# Patient Record
Sex: Male | Born: 1935 | Race: White | Hispanic: No | Marital: Married | Smoking: Former smoker
Health system: Southern US, Community
[De-identification: ages and names within clinical notes are randomized; demographics above are authoritative.]

## PROBLEM LIST (undated history)

## (undated) DIAGNOSIS — R519 Headache, unspecified: Secondary | ICD-10-CM

## (undated) DIAGNOSIS — K5900 Constipation, unspecified: Secondary | ICD-10-CM

## (undated) DIAGNOSIS — IMO0001 Reserved for inherently not codable concepts without codable children: Secondary | ICD-10-CM

## (undated) DIAGNOSIS — I739 Peripheral vascular disease, unspecified: Secondary | ICD-10-CM

## (undated) DIAGNOSIS — E1149 Type 2 diabetes mellitus with other diabetic neurological complication: Secondary | ICD-10-CM

## (undated) DIAGNOSIS — E782 Mixed hyperlipidemia: Secondary | ICD-10-CM

## (undated) DIAGNOSIS — G709 Myoneural disorder, unspecified: Secondary | ICD-10-CM

## (undated) DIAGNOSIS — M1990 Unspecified osteoarthritis, unspecified site: Secondary | ICD-10-CM

## (undated) DIAGNOSIS — I639 Cerebral infarction, unspecified: Secondary | ICD-10-CM

## (undated) DIAGNOSIS — G4733 Obstructive sleep apnea (adult) (pediatric): Secondary | ICD-10-CM

## (undated) DIAGNOSIS — D414 Neoplasm of uncertain behavior of bladder: Secondary | ICD-10-CM

## (undated) DIAGNOSIS — R259 Unspecified abnormal involuntary movements: Secondary | ICD-10-CM

## (undated) DIAGNOSIS — E039 Hypothyroidism, unspecified: Secondary | ICD-10-CM

## (undated) DIAGNOSIS — Z8719 Personal history of other diseases of the digestive system: Secondary | ICD-10-CM

## (undated) DIAGNOSIS — I4891 Unspecified atrial fibrillation: Secondary | ICD-10-CM

## (undated) DIAGNOSIS — I499 Cardiac arrhythmia, unspecified: Secondary | ICD-10-CM

## (undated) DIAGNOSIS — E538 Deficiency of other specified B group vitamins: Secondary | ICD-10-CM

## (undated) DIAGNOSIS — N200 Calculus of kidney: Secondary | ICD-10-CM

## (undated) DIAGNOSIS — G4730 Sleep apnea, unspecified: Secondary | ICD-10-CM

## (undated) DIAGNOSIS — R51 Headache: Secondary | ICD-10-CM

## (undated) DIAGNOSIS — N401 Enlarged prostate with lower urinary tract symptoms: Secondary | ICD-10-CM

## (undated) DIAGNOSIS — Z800 Family history of malignant neoplasm of digestive organs: Secondary | ICD-10-CM

## (undated) DIAGNOSIS — E669 Obesity, unspecified: Secondary | ICD-10-CM

## (undated) DIAGNOSIS — J90 Pleural effusion, not elsewhere classified: Secondary | ICD-10-CM

## (undated) DIAGNOSIS — N138 Other obstructive and reflux uropathy: Secondary | ICD-10-CM

## (undated) HISTORY — PX: KIDNEY STONE SURGERY: SHX686

## (undated) HISTORY — DX: Unspecified atrial fibrillation: I48.91

## (undated) HISTORY — PX: CATARACT EXTRACTION W/ INTRAOCULAR LENS  IMPLANT, BILATERAL: SHX1307

## (undated) HISTORY — DX: Mixed hyperlipidemia: E78.2

## (undated) HISTORY — DX: Constipation, unspecified: K59.00

## (undated) HISTORY — DX: Family history of malignant neoplasm of digestive organs: Z80.0

## (undated) HISTORY — PX: TRANSURETHRAL RESECTION OF BLADDER: SUR1395

## (undated) HISTORY — DX: Peripheral vascular disease, unspecified: I73.9

## (undated) HISTORY — PX: APPENDECTOMY: SHX54

## (undated) HISTORY — PX: URETERAL STENT PLACEMENT: SHX822

## (undated) HISTORY — PX: EYE SURGERY: SHX253

## (undated) HISTORY — PX: CYSTOSCOPY W/ STONE MANIPULATION: SHX1427

## (undated) HISTORY — DX: Deficiency of other specified B group vitamins: E53.8

## (undated) HISTORY — PX: OTHER SURGICAL HISTORY: SHX169

## (undated) HISTORY — DX: Other obstructive and reflux uropathy: N13.8

## (undated) HISTORY — DX: Obesity, unspecified: E66.9

## (undated) HISTORY — DX: Neoplasm of uncertain behavior of bladder: D41.4

## (undated) HISTORY — PX: FRACTURE SURGERY: SHX138

## (undated) HISTORY — DX: Benign prostatic hyperplasia with lower urinary tract symptoms: N40.1

## (undated) HISTORY — DX: Unspecified abnormal involuntary movements: R25.9

## (undated) HISTORY — DX: Type 2 diabetes mellitus with other diabetic neurological complication: E11.49

## (undated) HISTORY — DX: Sleep apnea, unspecified: G47.30

---
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
## 1974-12-05 HISTORY — PX: ORIF ELBOW FRACTURE: SHX5031

## 2001-06-28 ENCOUNTER — Encounter: Admission: RE | Admit: 2001-06-28 | Discharge: 2001-06-28 | Payer: Self-pay | Admitting: Internal Medicine

## 2001-06-28 ENCOUNTER — Encounter: Payer: Self-pay | Admitting: Internal Medicine

## 2001-09-24 ENCOUNTER — Encounter: Payer: Self-pay | Admitting: Gastroenterology

## 2001-09-24 ENCOUNTER — Encounter (INDEPENDENT_AMBULATORY_CARE_PROVIDER_SITE_OTHER): Payer: Self-pay | Admitting: *Deleted

## 2001-09-24 ENCOUNTER — Ambulatory Visit (HOSPITAL_COMMUNITY): Admission: RE | Admit: 2001-09-24 | Discharge: 2001-09-24 | Payer: Self-pay | Admitting: Gastroenterology

## 2001-10-11 ENCOUNTER — Encounter: Payer: Self-pay | Admitting: Urology

## 2001-10-11 ENCOUNTER — Encounter: Admission: RE | Admit: 2001-10-11 | Discharge: 2001-10-11 | Payer: Self-pay | Admitting: Urology

## 2002-04-24 ENCOUNTER — Encounter: Admission: RE | Admit: 2002-04-24 | Discharge: 2002-07-23 | Payer: Self-pay | Admitting: Gastroenterology

## 2003-07-12 ENCOUNTER — Emergency Department (HOSPITAL_COMMUNITY): Admission: EM | Admit: 2003-07-12 | Discharge: 2003-07-12 | Payer: Self-pay | Admitting: Emergency Medicine

## 2003-07-12 ENCOUNTER — Encounter: Payer: Self-pay | Admitting: Emergency Medicine

## 2003-07-15 ENCOUNTER — Encounter: Payer: Self-pay | Admitting: Urology

## 2003-07-17 ENCOUNTER — Ambulatory Visit (HOSPITAL_COMMUNITY): Admission: RE | Admit: 2003-07-17 | Discharge: 2003-07-17 | Payer: Self-pay | Admitting: Urology

## 2003-12-16 ENCOUNTER — Encounter: Admission: RE | Admit: 2003-12-16 | Discharge: 2003-12-16 | Payer: Self-pay | Admitting: Gastroenterology

## 2004-01-26 ENCOUNTER — Encounter (INDEPENDENT_AMBULATORY_CARE_PROVIDER_SITE_OTHER): Payer: Self-pay | Admitting: *Deleted

## 2004-01-26 ENCOUNTER — Ambulatory Visit (HOSPITAL_BASED_OUTPATIENT_CLINIC_OR_DEPARTMENT_OTHER): Admission: RE | Admit: 2004-01-26 | Discharge: 2004-01-26 | Payer: Self-pay | Admitting: Urology

## 2004-01-26 ENCOUNTER — Ambulatory Visit (HOSPITAL_COMMUNITY): Admission: RE | Admit: 2004-01-26 | Discharge: 2004-01-26 | Payer: Self-pay | Admitting: Urology

## 2004-02-25 ENCOUNTER — Encounter: Payer: Self-pay | Admitting: Gastroenterology

## 2004-07-28 ENCOUNTER — Ambulatory Visit (HOSPITAL_BASED_OUTPATIENT_CLINIC_OR_DEPARTMENT_OTHER): Admission: RE | Admit: 2004-07-28 | Discharge: 2004-07-28 | Payer: Self-pay | Admitting: Urology

## 2004-07-28 ENCOUNTER — Ambulatory Visit (HOSPITAL_COMMUNITY): Admission: RE | Admit: 2004-07-28 | Discharge: 2004-07-28 | Payer: Self-pay | Admitting: Urology

## 2004-08-11 ENCOUNTER — Encounter: Admission: RE | Admit: 2004-08-11 | Discharge: 2004-08-11 | Payer: Self-pay | Admitting: Urology

## 2004-08-12 ENCOUNTER — Ambulatory Visit (HOSPITAL_COMMUNITY): Admission: RE | Admit: 2004-08-12 | Discharge: 2004-08-12 | Payer: Self-pay | Admitting: Urology

## 2004-08-12 ENCOUNTER — Ambulatory Visit (HOSPITAL_BASED_OUTPATIENT_CLINIC_OR_DEPARTMENT_OTHER): Admission: RE | Admit: 2004-08-12 | Discharge: 2004-08-12 | Payer: Self-pay | Admitting: Urology

## 2006-02-27 ENCOUNTER — Encounter: Admission: RE | Admit: 2006-02-27 | Discharge: 2006-02-27 | Payer: Self-pay | Admitting: Gastroenterology

## 2006-07-14 ENCOUNTER — Inpatient Hospital Stay (HOSPITAL_COMMUNITY): Admission: AD | Admit: 2006-07-14 | Discharge: 2006-07-17 | Payer: Self-pay | Admitting: Cardiology

## 2006-10-16 ENCOUNTER — Ambulatory Visit (HOSPITAL_BASED_OUTPATIENT_CLINIC_OR_DEPARTMENT_OTHER): Admission: RE | Admit: 2006-10-16 | Discharge: 2006-10-16 | Payer: Self-pay | Admitting: Urology

## 2007-01-02 ENCOUNTER — Ambulatory Visit: Payer: Self-pay | Admitting: Cardiology

## 2007-01-10 ENCOUNTER — Ambulatory Visit: Payer: Self-pay | Admitting: Cardiology

## 2007-01-10 ENCOUNTER — Ambulatory Visit: Payer: Self-pay | Admitting: Internal Medicine

## 2007-01-15 ENCOUNTER — Ambulatory Visit: Payer: Self-pay | Admitting: Cardiology

## 2007-01-15 ENCOUNTER — Ambulatory Visit: Payer: Self-pay | Admitting: Internal Medicine

## 2007-01-15 LAB — CONVERTED CEMR LAB
Albumin: 3.8 g/dL (ref 3.5–5.2)
CO2: 26 meq/L (ref 19–32)
Calcium: 9.4 mg/dL (ref 8.4–10.5)
Chloride: 103 meq/L (ref 96–112)
Direct LDL: 56.1 mg/dL
GFR calc Af Amer: 70 mL/min
Glucose, Bld: 167 mg/dL — ABNORMAL HIGH (ref 70–99)
HDL: 32.2 mg/dL — ABNORMAL LOW (ref >39.0)
Microalb, Ur: 4.5 mg/dL — ABNORMAL HIGH (ref 0.0–1.9)
Sodium: 137 meq/L (ref 135–145)
Triglycerides: 342 mg/dL (ref 0–149)

## 2007-01-24 ENCOUNTER — Ambulatory Visit: Payer: Self-pay | Admitting: Cardiovascular Disease

## 2007-01-24 ENCOUNTER — Ambulatory Visit: Payer: Self-pay | Admitting: Internal Medicine

## 2007-01-24 LAB — CONVERTED CEMR LAB
GFR calc Af Amer: 70 mL/min
GFR calc non Af Amer: 58 mL/min
Potassium: 5.2 meq/L — ABNORMAL HIGH (ref 3.5–5.1)

## 2007-01-29 ENCOUNTER — Ambulatory Visit: Payer: Self-pay | Admitting: Internal Medicine

## 2007-01-30 ENCOUNTER — Ambulatory Visit: Payer: Self-pay | Admitting: Gastroenterology

## 2007-02-07 ENCOUNTER — Ambulatory Visit: Payer: Self-pay | Admitting: Gastroenterology

## 2007-02-08 ENCOUNTER — Ambulatory Visit: Payer: Self-pay | Admitting: Internal Medicine

## 2007-02-28 ENCOUNTER — Ambulatory Visit: Payer: Self-pay | Admitting: Internal Medicine

## 2007-03-08 ENCOUNTER — Ambulatory Visit: Payer: Self-pay | Admitting: Cardiology

## 2007-03-23 ENCOUNTER — Ambulatory Visit: Payer: Self-pay | Admitting: Gastroenterology

## 2007-03-26 ENCOUNTER — Ambulatory Visit: Payer: Self-pay | Admitting: Cardiology

## 2007-04-25 ENCOUNTER — Ambulatory Visit: Payer: Self-pay | Admitting: Cardiology

## 2007-05-14 ENCOUNTER — Ambulatory Visit: Payer: Self-pay | Admitting: Internal Medicine

## 2007-05-14 ENCOUNTER — Encounter: Payer: Self-pay | Admitting: Internal Medicine

## 2007-05-14 ENCOUNTER — Ambulatory Visit (HOSPITAL_BASED_OUTPATIENT_CLINIC_OR_DEPARTMENT_OTHER): Admission: RE | Admit: 2007-05-14 | Discharge: 2007-05-14 | Payer: Self-pay | Admitting: Internal Medicine

## 2007-05-19 ENCOUNTER — Ambulatory Visit: Payer: Self-pay | Admitting: Internal Medicine

## 2007-05-29 ENCOUNTER — Ambulatory Visit: Payer: Self-pay | Admitting: Internal Medicine

## 2007-05-29 ENCOUNTER — Ambulatory Visit: Payer: Self-pay | Admitting: Cardiovascular Disease

## 2007-05-29 LAB — CONVERTED CEMR LAB
ALT: 19 units/L (ref 0–40)
Albumin: 3.6 g/dL (ref 3.5–5.2)
Alkaline Phosphatase: 73 units/L (ref 39–117)
BUN: 30 mg/dL — ABNORMAL HIGH (ref 6–23)
Basophils Relative: 0.4 % (ref 0.0–1.0)
Bilirubin, Direct: 0.1 mg/dL (ref 0.0–0.3)
Cholesterol: 133 mg/dL (ref 0–200)
Direct LDL: 66.2 mg/dL
GFR calc Af Amer: 64 mL/min
GFR calc non Af Amer: 53 mL/min
HCT: 42.6 % (ref 39.0–52.0)
HDL: 26.2 mg/dL — ABNORMAL LOW (ref >39.0)
Hemoglobin: 14.3 g/dL (ref 13.0–17.0)
Lymphocytes Relative: 19 % (ref 12.0–46.0)
MCHC: 33.5 g/dL (ref 30.0–36.0)
MCV: 93 fL (ref 78.0–100.0)
Monocytes Relative: 8.8 % (ref 3.0–11.0)
Platelets: 166 10*3/uL (ref 150–400)
Pro B Natriuretic peptide (BNP): 101 pg/mL — ABNORMAL HIGH (ref 0.0–100.0)
RDW: 14.5 % (ref 11.5–14.6)
Sodium: 145 meq/L (ref 135–145)
TSH: 3.96 microintl units/mL (ref 0.35–5.50)
Total CHOL/HDL Ratio: 5.1
Triglycerides: 203 mg/dL (ref 0–149)
VLDL: 41 mg/dL — ABNORMAL HIGH (ref 0–40)
WBC: 6.4 10*3/uL (ref 4.5–10.5)

## 2007-06-13 ENCOUNTER — Ambulatory Visit: Payer: Self-pay | Admitting: Internal Medicine

## 2007-06-26 ENCOUNTER — Ambulatory Visit: Payer: Self-pay | Admitting: Cardiology

## 2007-07-16 ENCOUNTER — Ambulatory Visit: Payer: Self-pay | Admitting: Internal Medicine

## 2007-07-17 ENCOUNTER — Ambulatory Visit: Payer: Self-pay | Admitting: Internal Medicine

## 2007-07-24 ENCOUNTER — Ambulatory Visit: Payer: Self-pay | Admitting: Cardiology

## 2007-08-16 ENCOUNTER — Ambulatory Visit: Payer: Self-pay | Admitting: Internal Medicine

## 2007-08-21 ENCOUNTER — Ambulatory Visit: Payer: Self-pay | Admitting: Cardiology

## 2007-08-21 ENCOUNTER — Ambulatory Visit: Payer: Self-pay | Admitting: Internal Medicine

## 2007-08-24 ENCOUNTER — Ambulatory Visit: Payer: Self-pay | Admitting: Internal Medicine

## 2007-08-24 DIAGNOSIS — E1065 Type 1 diabetes mellitus with hyperglycemia: Secondary | ICD-10-CM

## 2007-08-24 LAB — CONVERTED CEMR LAB
BUN: 22 mg/dL (ref 6–23)
CO2: 31 meq/L (ref 19–32)
GFR calc Af Amer: 69 mL/min
Glucose, Bld: 151 mg/dL — ABNORMAL HIGH (ref 70–99)
Potassium: 4.8 meq/L (ref 3.5–5.1)

## 2007-09-07 ENCOUNTER — Encounter: Payer: Self-pay | Admitting: Internal Medicine

## 2007-09-07 ENCOUNTER — Ambulatory Visit: Payer: Self-pay | Admitting: Internal Medicine

## 2007-09-12 ENCOUNTER — Ambulatory Visit: Payer: Self-pay | Admitting: Internal Medicine

## 2007-09-18 ENCOUNTER — Ambulatory Visit: Payer: Self-pay | Admitting: Cardiovascular Disease

## 2007-10-08 ENCOUNTER — Encounter: Payer: Self-pay | Admitting: Internal Medicine

## 2007-10-08 DIAGNOSIS — E669 Obesity, unspecified: Secondary | ICD-10-CM

## 2007-10-08 DIAGNOSIS — G4733 Obstructive sleep apnea (adult) (pediatric): Secondary | ICD-10-CM

## 2007-10-08 DIAGNOSIS — E782 Mixed hyperlipidemia: Secondary | ICD-10-CM

## 2007-10-08 DIAGNOSIS — I4891 Unspecified atrial fibrillation: Secondary | ICD-10-CM | POA: Insufficient documentation

## 2007-10-12 ENCOUNTER — Ambulatory Visit: Payer: Self-pay | Admitting: Internal Medicine

## 2007-10-12 DIAGNOSIS — N400 Enlarged prostate without lower urinary tract symptoms: Secondary | ICD-10-CM

## 2007-10-18 ENCOUNTER — Ambulatory Visit: Payer: Self-pay | Admitting: Cardiology

## 2007-10-31 ENCOUNTER — Ambulatory Visit: Payer: Self-pay | Admitting: Cardiology

## 2007-11-19 ENCOUNTER — Telehealth: Payer: Self-pay | Admitting: Internal Medicine

## 2007-11-21 ENCOUNTER — Telehealth: Payer: Self-pay | Admitting: Internal Medicine

## 2007-12-13 ENCOUNTER — Telehealth: Payer: Self-pay | Admitting: Internal Medicine

## 2007-12-14 ENCOUNTER — Ambulatory Visit: Payer: Self-pay | Admitting: Internal Medicine

## 2007-12-14 LAB — CONVERTED CEMR LAB
ALT: 27 units/L (ref 0–53)
Alkaline Phosphatase: 100 units/L (ref 39–117)
BUN: 27 mg/dL — ABNORMAL HIGH (ref 6–23)
Bilirubin Urine: NEGATIVE
CO2: 27 meq/L (ref 19–32)
Calcium: 9.3 mg/dL (ref 8.4–10.5)
Chloride: 104 meq/L (ref 96–112)
Creatinine, Ser: 1.3 mg/dL (ref 0.4–1.5)
Crystals: NEGATIVE
GFR calc non Af Amer: 57 mL/min
Glucose, Bld: 280 mg/dL — ABNORMAL HIGH (ref 70–99)
Hemoglobin, Urine: NEGATIVE
Leukocytes, UA: NEGATIVE
Potassium: 4.8 meq/L (ref 3.5–5.1)
Specific Gravity, Urine: 1.02 (ref 1.000–1.03)
Total Bilirubin: 0.9 mg/dL (ref 0.3–1.2)
Total CHOL/HDL Ratio: 4.8
Total Protein, Urine: NEGATIVE mg/dL
Urobilinogen, UA: 0.2 (ref 0.0–1.0)
Vitamin B-12: 138 pg/mL — ABNORMAL LOW (ref 211–911)
pH: 5 (ref 5.0–8.0)

## 2007-12-16 ENCOUNTER — Encounter: Payer: Self-pay | Admitting: Internal Medicine

## 2007-12-20 ENCOUNTER — Ambulatory Visit: Payer: Self-pay | Admitting: Internal Medicine

## 2008-01-11 ENCOUNTER — Ambulatory Visit: Payer: Self-pay | Admitting: Internal Medicine

## 2008-01-17 ENCOUNTER — Ambulatory Visit: Payer: Self-pay | Admitting: Cardiology

## 2008-01-25 ENCOUNTER — Telehealth: Payer: Self-pay | Admitting: Internal Medicine

## 2008-01-31 ENCOUNTER — Ambulatory Visit: Payer: Self-pay | Admitting: Cardiology

## 2008-02-14 ENCOUNTER — Telehealth: Payer: Self-pay | Admitting: Internal Medicine

## 2008-02-14 ENCOUNTER — Ambulatory Visit: Payer: Self-pay | Admitting: Cardiology

## 2008-02-14 ENCOUNTER — Encounter: Payer: Self-pay | Admitting: Internal Medicine

## 2008-02-14 ENCOUNTER — Ambulatory Visit: Payer: Self-pay

## 2008-02-26 ENCOUNTER — Ambulatory Visit: Payer: Self-pay | Admitting: Internal Medicine

## 2008-02-26 ENCOUNTER — Telehealth: Payer: Self-pay | Admitting: Internal Medicine

## 2008-02-26 DIAGNOSIS — R42 Dizziness and giddiness: Secondary | ICD-10-CM

## 2008-02-26 LAB — CONVERTED CEMR LAB
CO2: 33 meq/L — ABNORMAL HIGH (ref 19–32)
Creatinine, Ser: 1.2 mg/dL (ref 0.4–1.5)
Potassium: 5.3 meq/L — ABNORMAL HIGH (ref 3.5–5.1)

## 2008-02-28 ENCOUNTER — Ambulatory Visit: Payer: Self-pay | Admitting: Internal Medicine

## 2008-03-03 ENCOUNTER — Encounter: Payer: Self-pay | Admitting: Internal Medicine

## 2008-03-04 ENCOUNTER — Encounter: Admission: RE | Admit: 2008-03-04 | Discharge: 2008-03-04 | Payer: Self-pay | Admitting: Internal Medicine

## 2008-03-06 ENCOUNTER — Encounter: Payer: Self-pay | Admitting: Internal Medicine

## 2008-03-20 ENCOUNTER — Encounter: Payer: Self-pay | Admitting: Internal Medicine

## 2008-03-25 ENCOUNTER — Ambulatory Visit: Payer: Self-pay | Admitting: Cardiology

## 2008-04-22 ENCOUNTER — Ambulatory Visit: Payer: Self-pay | Admitting: Cardiology

## 2008-04-25 ENCOUNTER — Encounter: Payer: Self-pay | Admitting: Internal Medicine

## 2008-05-01 ENCOUNTER — Encounter: Payer: Self-pay | Admitting: Internal Medicine

## 2008-06-25 ENCOUNTER — Ambulatory Visit: Payer: Self-pay | Admitting: Internal Medicine

## 2008-07-09 ENCOUNTER — Encounter: Payer: Self-pay | Admitting: Gastroenterology

## 2008-07-23 ENCOUNTER — Ambulatory Visit: Payer: Self-pay | Admitting: Cardiology

## 2008-08-20 ENCOUNTER — Ambulatory Visit: Payer: Self-pay | Admitting: Cardiovascular Disease

## 2008-09-09 ENCOUNTER — Ambulatory Visit: Payer: Self-pay | Admitting: Internal Medicine

## 2008-09-09 DIAGNOSIS — I739 Peripheral vascular disease, unspecified: Secondary | ICD-10-CM

## 2008-09-10 ENCOUNTER — Ambulatory Visit: Payer: Self-pay | Admitting: Internal Medicine

## 2008-09-24 ENCOUNTER — Ambulatory Visit: Payer: Self-pay | Admitting: Internal Medicine

## 2008-09-24 ENCOUNTER — Ambulatory Visit: Payer: Self-pay

## 2008-09-26 ENCOUNTER — Ambulatory Visit: Payer: Self-pay | Admitting: Internal Medicine

## 2008-09-26 DIAGNOSIS — E1149 Type 2 diabetes mellitus with other diabetic neurological complication: Secondary | ICD-10-CM

## 2008-09-26 LAB — CONVERTED CEMR LAB
ALT: 29 units/L (ref 0–53)
AST: 27 units/L (ref 0–37)
Albumin: 3.5 g/dL (ref 3.5–5.2)
BUN: 27 mg/dL — ABNORMAL HIGH (ref 6–23)
Bilirubin, Direct: 0.3 mg/dL (ref 0.0–0.3)
CO2: 31 meq/L (ref 19–32)
LDL Cholesterol: 49 mg/dL (ref 0–99)
Total Bilirubin: 1.2 mg/dL (ref 0.3–1.2)
Total CHOL/HDL Ratio: 3.7
Total Protein: 6.7 g/dL (ref 6.0–8.3)
Vitamin B-12: 141 pg/mL — ABNORMAL LOW (ref 211–911)

## 2008-09-28 ENCOUNTER — Encounter: Payer: Self-pay | Admitting: Internal Medicine

## 2008-10-08 ENCOUNTER — Ambulatory Visit: Payer: Self-pay | Admitting: Cardiovascular Disease

## 2008-10-09 ENCOUNTER — Ambulatory Visit: Payer: Self-pay | Admitting: Internal Medicine

## 2008-10-09 DIAGNOSIS — E538 Deficiency of other specified B group vitamins: Secondary | ICD-10-CM

## 2008-11-11 ENCOUNTER — Encounter: Payer: Self-pay | Admitting: Internal Medicine

## 2008-11-11 ENCOUNTER — Ambulatory Visit: Payer: Self-pay | Admitting: Internal Medicine

## 2008-11-13 ENCOUNTER — Ambulatory Visit: Payer: Self-pay | Admitting: Internal Medicine

## 2008-11-13 ENCOUNTER — Telehealth: Payer: Self-pay | Admitting: Internal Medicine

## 2008-11-13 DIAGNOSIS — G20 Parkinson's disease: Secondary | ICD-10-CM

## 2008-11-26 ENCOUNTER — Encounter: Payer: Self-pay | Admitting: Internal Medicine

## 2008-12-09 ENCOUNTER — Ambulatory Visit: Payer: Self-pay | Admitting: Internal Medicine

## 2008-12-25 ENCOUNTER — Ambulatory Visit: Payer: Self-pay | Admitting: Cardiology

## 2009-01-08 ENCOUNTER — Telehealth: Payer: Self-pay | Admitting: Internal Medicine

## 2009-01-22 ENCOUNTER — Ambulatory Visit: Payer: Self-pay | Admitting: Internal Medicine

## 2009-01-26 ENCOUNTER — Ambulatory Visit: Payer: Self-pay | Admitting: Gastroenterology

## 2009-01-26 DIAGNOSIS — K5900 Constipation, unspecified: Secondary | ICD-10-CM | POA: Insufficient documentation

## 2009-02-09 ENCOUNTER — Telehealth (INDEPENDENT_AMBULATORY_CARE_PROVIDER_SITE_OTHER): Payer: Self-pay | Admitting: *Deleted

## 2009-02-13 ENCOUNTER — Ambulatory Visit: Payer: Self-pay | Admitting: Internal Medicine

## 2009-02-17 ENCOUNTER — Telehealth: Payer: Self-pay | Admitting: Gastroenterology

## 2009-02-23 ENCOUNTER — Encounter: Payer: Self-pay | Admitting: Gastroenterology

## 2009-02-23 ENCOUNTER — Ambulatory Visit: Payer: Self-pay | Admitting: Gastroenterology

## 2009-02-25 ENCOUNTER — Encounter: Payer: Self-pay | Admitting: Gastroenterology

## 2009-03-02 ENCOUNTER — Ambulatory Visit: Payer: Self-pay | Admitting: Cardiology

## 2009-03-23 ENCOUNTER — Ambulatory Visit: Payer: Self-pay | Admitting: Cardiovascular Disease

## 2009-04-10 ENCOUNTER — Ambulatory Visit: Payer: Self-pay | Admitting: Cardiovascular Disease

## 2009-04-24 ENCOUNTER — Ambulatory Visit: Payer: Self-pay | Admitting: Gastroenterology

## 2009-05-01 ENCOUNTER — Encounter (INDEPENDENT_AMBULATORY_CARE_PROVIDER_SITE_OTHER): Payer: Self-pay | Admitting: *Deleted

## 2009-05-05 ENCOUNTER — Encounter: Payer: Self-pay | Admitting: *Deleted

## 2009-05-08 ENCOUNTER — Ambulatory Visit: Payer: Self-pay | Admitting: Cardiology

## 2009-05-08 LAB — CONVERTED CEMR LAB: Protime: 16.8

## 2009-05-11 ENCOUNTER — Inpatient Hospital Stay (HOSPITAL_COMMUNITY): Admission: EM | Admit: 2009-05-11 | Discharge: 2009-05-13 | Payer: Self-pay | Admitting: Emergency Medicine

## 2009-05-11 ENCOUNTER — Ambulatory Visit: Payer: Self-pay | Admitting: Internal Medicine

## 2009-05-11 ENCOUNTER — Telehealth: Payer: Self-pay | Admitting: Internal Medicine

## 2009-06-02 ENCOUNTER — Ambulatory Visit: Payer: Self-pay | Admitting: Internal Medicine

## 2009-06-02 ENCOUNTER — Telehealth: Payer: Self-pay | Admitting: Internal Medicine

## 2009-06-02 LAB — CONVERTED CEMR LAB: POC INR: 2

## 2009-06-09 ENCOUNTER — Ambulatory Visit: Payer: Self-pay | Admitting: Gastroenterology

## 2009-06-10 ENCOUNTER — Encounter: Payer: Self-pay | Admitting: *Deleted

## 2009-06-18 ENCOUNTER — Telehealth: Payer: Self-pay | Admitting: Internal Medicine

## 2009-06-23 ENCOUNTER — Telehealth (INDEPENDENT_AMBULATORY_CARE_PROVIDER_SITE_OTHER): Payer: Self-pay | Admitting: *Deleted

## 2009-06-30 ENCOUNTER — Encounter: Payer: Self-pay | Admitting: Internal Medicine

## 2009-06-30 ENCOUNTER — Ambulatory Visit: Payer: Self-pay | Admitting: Internal Medicine

## 2009-06-30 LAB — CONVERTED CEMR LAB
POC INR: 2.5
Prothrombin Time: 19.2 s

## 2009-07-14 ENCOUNTER — Telehealth (INDEPENDENT_AMBULATORY_CARE_PROVIDER_SITE_OTHER): Payer: Self-pay | Admitting: *Deleted

## 2009-07-16 ENCOUNTER — Ambulatory Visit: Payer: Self-pay | Admitting: Internal Medicine

## 2009-07-21 ENCOUNTER — Encounter: Payer: Self-pay | Admitting: Internal Medicine

## 2009-07-21 ENCOUNTER — Ambulatory Visit: Payer: Self-pay

## 2009-07-28 ENCOUNTER — Ambulatory Visit: Payer: Self-pay | Admitting: Internal Medicine

## 2009-07-28 LAB — CONVERTED CEMR LAB: POC INR: 2

## 2009-08-25 ENCOUNTER — Ambulatory Visit: Payer: Self-pay | Admitting: Cardiology

## 2009-08-25 LAB — CONVERTED CEMR LAB: POC INR: 2.4

## 2009-09-22 ENCOUNTER — Ambulatory Visit: Payer: Self-pay | Admitting: Cardiology

## 2009-09-22 LAB — CONVERTED CEMR LAB: POC INR: 3.3

## 2009-10-13 ENCOUNTER — Telehealth: Payer: Self-pay | Admitting: Internal Medicine

## 2009-10-13 ENCOUNTER — Ambulatory Visit: Payer: Self-pay | Admitting: Internal Medicine

## 2009-10-13 ENCOUNTER — Ambulatory Visit: Payer: Self-pay | Admitting: Cardiology

## 2009-10-13 DIAGNOSIS — E039 Hypothyroidism, unspecified: Secondary | ICD-10-CM

## 2009-10-13 DIAGNOSIS — B353 Tinea pedis: Secondary | ICD-10-CM

## 2009-10-13 LAB — CONVERTED CEMR LAB
ALT: 33 units/L (ref 0–53)
AST: 27 units/L (ref 0–37)
BUN: 32 mg/dL — ABNORMAL HIGH (ref 6–23)
Basophils Relative: 0 % (ref 0.0–3.0)
CO2: 31 meq/L (ref 19–32)
Eosinophils Relative: 2.2 % (ref 0.0–5.0)
Folate: 13.8 ng/mL
GFR calc non Af Amer: 52.33 mL/min (ref >60)
Lymphocytes Relative: 16.4 % (ref 12.0–46.0)
Monocytes Relative: 8.6 % (ref 3.0–12.0)
Neutro Abs: 5.3 10*3/uL (ref 1.4–7.7)
POC INR: 1.8
Platelets: 134 10*3/uL — ABNORMAL LOW (ref 150.0–400.0)
RBC: 4.64 M/uL (ref 4.22–5.81)
Total Bilirubin: 1.2 mg/dL (ref 0.3–1.2)
Vitamin B-12: 147 pg/mL — ABNORMAL LOW (ref 211–911)
WBC: 7.3 10*3/uL (ref 4.5–10.5)

## 2009-10-15 ENCOUNTER — Telehealth: Payer: Self-pay | Admitting: Internal Medicine

## 2009-10-15 ENCOUNTER — Ambulatory Visit: Payer: Self-pay | Admitting: Internal Medicine

## 2009-10-15 ENCOUNTER — Encounter: Payer: Self-pay | Admitting: Internal Medicine

## 2009-10-15 DIAGNOSIS — R9389 Abnormal findings on diagnostic imaging of other specified body structures: Secondary | ICD-10-CM | POA: Insufficient documentation

## 2009-10-19 ENCOUNTER — Encounter: Payer: Self-pay | Admitting: Internal Medicine

## 2009-10-20 ENCOUNTER — Encounter: Payer: Self-pay | Admitting: Cardiology

## 2009-10-20 ENCOUNTER — Encounter: Payer: Self-pay | Admitting: Internal Medicine

## 2009-10-20 ENCOUNTER — Ambulatory Visit: Payer: Self-pay | Admitting: Internal Medicine

## 2009-10-20 ENCOUNTER — Encounter (HOSPITAL_BASED_OUTPATIENT_CLINIC_OR_DEPARTMENT_OTHER): Admission: RE | Admit: 2009-10-20 | Discharge: 2009-11-16 | Payer: Self-pay | Admitting: Internal Medicine

## 2009-10-21 ENCOUNTER — Encounter: Payer: Self-pay | Admitting: Internal Medicine

## 2009-10-27 ENCOUNTER — Ambulatory Visit: Payer: Self-pay | Admitting: Internal Medicine

## 2009-10-27 ENCOUNTER — Encounter: Payer: Self-pay | Admitting: Internal Medicine

## 2009-10-27 ENCOUNTER — Encounter (INDEPENDENT_AMBULATORY_CARE_PROVIDER_SITE_OTHER): Payer: Self-pay | Admitting: *Deleted

## 2009-10-28 ENCOUNTER — Ambulatory Visit: Payer: Self-pay | Admitting: Surgery

## 2009-10-28 ENCOUNTER — Ambulatory Visit: Admission: RE | Admit: 2009-10-28 | Discharge: 2009-10-28 | Payer: Self-pay | Admitting: Internal Medicine

## 2009-10-28 ENCOUNTER — Encounter (HOSPITAL_BASED_OUTPATIENT_CLINIC_OR_DEPARTMENT_OTHER): Payer: Self-pay | Admitting: Internal Medicine

## 2009-11-02 ENCOUNTER — Ambulatory Visit: Payer: Self-pay | Admitting: Internal Medicine

## 2009-11-02 ENCOUNTER — Encounter: Payer: Self-pay | Admitting: Internal Medicine

## 2009-11-02 ENCOUNTER — Encounter: Payer: Self-pay | Admitting: Cardiology

## 2009-11-04 ENCOUNTER — Encounter: Payer: Self-pay | Admitting: Internal Medicine

## 2009-11-06 ENCOUNTER — Encounter: Payer: Self-pay | Admitting: Internal Medicine

## 2009-11-17 ENCOUNTER — Ambulatory Visit: Payer: Self-pay | Admitting: Internal Medicine

## 2009-11-24 ENCOUNTER — Ambulatory Visit: Payer: Self-pay | Admitting: Internal Medicine

## 2009-12-14 ENCOUNTER — Ambulatory Visit: Payer: Self-pay | Admitting: Internal Medicine

## 2009-12-28 ENCOUNTER — Ambulatory Visit: Payer: Self-pay | Admitting: Internal Medicine

## 2009-12-31 ENCOUNTER — Encounter: Payer: Self-pay | Admitting: Internal Medicine

## 2010-01-04 ENCOUNTER — Encounter: Payer: Self-pay | Admitting: Internal Medicine

## 2010-01-06 ENCOUNTER — Encounter: Payer: Self-pay | Admitting: Internal Medicine

## 2010-01-12 ENCOUNTER — Ambulatory Visit: Payer: Self-pay | Admitting: Internal Medicine

## 2010-01-14 ENCOUNTER — Telehealth: Payer: Self-pay | Admitting: Internal Medicine

## 2010-01-20 ENCOUNTER — Telehealth: Payer: Self-pay | Admitting: Internal Medicine

## 2010-02-09 ENCOUNTER — Ambulatory Visit: Payer: Self-pay | Admitting: Internal Medicine

## 2010-02-18 ENCOUNTER — Encounter: Payer: Self-pay | Admitting: Internal Medicine

## 2010-03-15 ENCOUNTER — Ambulatory Visit: Payer: Self-pay | Admitting: Internal Medicine

## 2010-03-29 ENCOUNTER — Ambulatory Visit: Payer: Self-pay | Admitting: Internal Medicine

## 2010-04-14 ENCOUNTER — Encounter: Payer: Self-pay | Admitting: Internal Medicine

## 2010-05-10 ENCOUNTER — Ambulatory Visit: Payer: Self-pay | Admitting: Internal Medicine

## 2010-06-09 ENCOUNTER — Ambulatory Visit: Payer: Self-pay | Admitting: Internal Medicine

## 2010-07-01 ENCOUNTER — Encounter: Payer: Self-pay | Admitting: Internal Medicine

## 2010-07-12 ENCOUNTER — Ambulatory Visit: Payer: Self-pay | Admitting: Internal Medicine

## 2010-07-20 ENCOUNTER — Telehealth (INDEPENDENT_AMBULATORY_CARE_PROVIDER_SITE_OTHER): Payer: Self-pay | Admitting: *Deleted

## 2010-07-26 ENCOUNTER — Ambulatory Visit: Payer: Self-pay | Admitting: Internal Medicine

## 2010-07-30 ENCOUNTER — Telehealth: Payer: Self-pay | Admitting: Internal Medicine

## 2010-07-30 LAB — CONVERTED CEMR LAB
CO2: 31 meq/L (ref 19–32)
Chloride: 106 meq/L (ref 96–112)
Creatinine, Ser: 1.6 mg/dL — ABNORMAL HIGH (ref 0.4–1.5)
Potassium: 5.6 meq/L — ABNORMAL HIGH (ref 3.5–5.1)

## 2010-08-04 ENCOUNTER — Ambulatory Visit: Payer: Self-pay | Admitting: Internal Medicine

## 2010-08-11 ENCOUNTER — Telehealth: Payer: Self-pay | Admitting: Internal Medicine

## 2010-08-11 ENCOUNTER — Telehealth (INDEPENDENT_AMBULATORY_CARE_PROVIDER_SITE_OTHER): Payer: Self-pay | Admitting: *Deleted

## 2010-08-11 ENCOUNTER — Ambulatory Visit: Payer: Self-pay | Admitting: Internal Medicine

## 2010-08-11 LAB — CONVERTED CEMR LAB
BUN: 36 mg/dL — ABNORMAL HIGH (ref 6–23)
Calcium: 9.4 mg/dL (ref 8.4–10.5)
Chloride: 106 meq/L (ref 96–112)
GFR calc non Af Amer: 54.93 mL/min (ref >60)
Sodium: 145 meq/L (ref 135–145)

## 2010-08-12 ENCOUNTER — Ambulatory Visit: Payer: Self-pay | Admitting: Internal Medicine

## 2010-08-15 ENCOUNTER — Telehealth: Payer: Self-pay | Admitting: Internal Medicine

## 2010-09-07 ENCOUNTER — Ambulatory Visit: Payer: Self-pay | Admitting: Cardiology

## 2010-09-07 ENCOUNTER — Ambulatory Visit (HOSPITAL_COMMUNITY): Admission: RE | Admit: 2010-09-07 | Discharge: 2010-09-07 | Payer: Self-pay | Admitting: Internal Medicine

## 2010-09-07 ENCOUNTER — Encounter: Payer: Self-pay | Admitting: Internal Medicine

## 2010-09-07 ENCOUNTER — Ambulatory Visit: Payer: Self-pay | Admitting: Internal Medicine

## 2010-09-07 ENCOUNTER — Ambulatory Visit: Payer: Self-pay

## 2010-10-12 ENCOUNTER — Ambulatory Visit: Payer: Self-pay | Admitting: Internal Medicine

## 2010-10-12 ENCOUNTER — Encounter: Payer: Self-pay | Admitting: Internal Medicine

## 2010-11-08 ENCOUNTER — Ambulatory Visit: Payer: Self-pay | Admitting: Internal Medicine

## 2010-11-18 ENCOUNTER — Encounter (INDEPENDENT_AMBULATORY_CARE_PROVIDER_SITE_OTHER): Payer: Self-pay | Admitting: *Deleted

## 2010-11-18 ENCOUNTER — Ambulatory Visit: Payer: Self-pay | Admitting: Internal Medicine

## 2010-11-18 LAB — CONVERTED CEMR LAB
ALT: 19 units/L (ref 0–53)
Albumin: 3.5 g/dL (ref 3.5–5.2)
BUN: 38 mg/dL — ABNORMAL HIGH (ref 6–23)
CO2: 30 meq/L (ref 19–32)
Chloride: 106 meq/L (ref 96–112)
Creatinine, Ser: 1.3 mg/dL (ref 0.4–1.5)
Folate: 7.7 ng/mL
HDL: 34.2 mg/dL — ABNORMAL LOW (ref >39.00)
Hgb A1c MFr Bld: 7.3 % — ABNORMAL HIGH (ref 4.6–6.5)
Total Bilirubin: 0.9 mg/dL (ref 0.3–1.2)
Triglycerides: 107 mg/dL (ref 0.0–149.0)
VLDL: 21.4 mg/dL (ref 0.0–40.0)

## 2010-11-21 ENCOUNTER — Encounter: Payer: Self-pay | Admitting: Internal Medicine

## 2010-11-24 ENCOUNTER — Encounter: Payer: Self-pay | Admitting: Internal Medicine

## 2010-11-24 ENCOUNTER — Ambulatory Visit: Payer: Self-pay | Admitting: Internal Medicine

## 2010-11-25 ENCOUNTER — Telehealth: Payer: Self-pay | Admitting: Internal Medicine

## 2010-12-08 ENCOUNTER — Ambulatory Visit
Admission: RE | Admit: 2010-12-08 | Discharge: 2010-12-08 | Payer: Self-pay | Source: Home / Self Care | Attending: Internal Medicine | Admitting: Internal Medicine

## 2010-12-22 ENCOUNTER — Ambulatory Visit
Admission: RE | Admit: 2010-12-22 | Discharge: 2010-12-22 | Payer: Self-pay | Source: Home / Self Care | Attending: Internal Medicine | Admitting: Internal Medicine

## 2010-12-24 ENCOUNTER — Ambulatory Visit
Admission: RE | Admit: 2010-12-24 | Discharge: 2010-12-24 | Payer: Self-pay | Source: Home / Self Care | Attending: Internal Medicine | Admitting: Internal Medicine

## 2010-12-25 ENCOUNTER — Encounter: Payer: Self-pay | Admitting: Internal Medicine

## 2010-12-30 ENCOUNTER — Encounter: Payer: Self-pay | Admitting: Internal Medicine

## 2010-12-31 ENCOUNTER — Ambulatory Visit: Admission: RE | Admit: 2010-12-31 | Discharge: 2010-12-31 | Payer: Self-pay | Source: Home / Self Care

## 2010-12-31 ENCOUNTER — Telehealth: Payer: Self-pay | Admitting: Internal Medicine

## 2010-12-31 ENCOUNTER — Encounter: Payer: Self-pay | Admitting: Physician Assistant

## 2011-01-03 ENCOUNTER — Telehealth: Payer: Self-pay | Admitting: Internal Medicine

## 2011-01-04 NOTE — Progress Notes (Signed)
  Phone Note Outgoing Call   Reason for Call: Discuss lab or test results Summary of Call: please call patient: A1C 7.3%. Not really changed from previous lab. Please monitory CBGs closely since we have lowered your lantus dose.  Thanks Initial call taken by: Jacques Navy MD,  August 15, 2010 9:32 PM  Follow-up for Phone Call        Informed pt. He would like to know when you would like to follow up with him in the office.  Follow-up by: Ami Bullins CMA,  August 16, 2010 2:42 PM  Additional Follow-up for Phone Call Additional follow up Details #1::        He needs to let me know if he is having any more hypoglycemic episodes and he will be seen urgently. He will need A1C in 3 months and OV at that time. Additional Follow-up by: Jacques Navy MD,  August 16, 2010 6:20 PM    Additional Follow-up for Phone Call Additional follow up Details #2::    lmam for pt to call back Follow-up by: Ami Bullins CMA,  August 17, 2010 11:07 AM  Additional Follow-up for Phone Call Additional follow up Details #3:: Details for Additional Follow-up Action Taken: informed and pt and sent to cheryl to sch 3 month appt Additional Follow-up by: Ami Bullins CMA,  August 17, 2010 4:27 PM

## 2011-01-04 NOTE — Progress Notes (Signed)
Summary: Pt returning call   Phone Note Call from Patient Call back at Home Phone [phone]   Caller: Patient Summary of Call: Pt returning call Initial call taken by: Judie Grieve,  July 30, 2010 3:28 PM  Follow-up for Phone Call        pt aware of result, he is on potassium for kidney stones and is not comfortable stopping he will call Dr Ulanda Edison, RN  July 30, 2010 4:26 PM

## 2011-01-04 NOTE — Letter (Signed)
Summary: [facility]  [facility]   Imported By: Sherian Rein 01/12/2010 14:28:02  _____________________________________________________________________  External Attachment:    Type:   Image     Comment:   External Document

## 2011-01-04 NOTE — Assessment & Plan Note (Signed)
Summary: 9 month rov [phone]  pfh,rn  Medications Added METOPROLOL SUCCINATE 100 MG XR24H-TAB (METOPROLOL SUCCINATE) take one tablet once daily DIAZEPAM 2 MG TABS (DIAZEPAM) take one tablet once daily      Allergies Added:   Referring [name] [name]:  N/A Primary [name] [name]:  Oliver Barre MD  CC:  9 month rov/  Pt states he has been doing well and his breathing has been fair.  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   Overall doing well. Works on farm but moves slowly. Had several episodes last month where he felt his heart was racing. Last just a little while and resolved. No syncope, CP or dyspnea. Did not take pulse. Enrolled in Engage trial. LLE ulcer has healed. Recenly having problems with kidney stones. Had BMET wik K+5.3 and CR 1.6. Kcl stopped.      Current Medications (verified): 1)  Digoxin 0.25 Mg  Tabs (Digoxin) .[name].. 1 Once Daily 2)  Doxazosin Mesylate 4 Mg  Tabs (Doxazosin Mesylate) .[name].. 1 Once Daily 3)  Furosemide 20 Mg  Tabs (Furosemide) .[name].. 1 Once Daily 4)  Lantus Solostar 100 Unit/ml  Soln (Insulin Glargine) .[name].. 100 Units At Bedtime 5)  Lovastatin 20 Mg  Tabs (Lovastatin) .[name].. 1 Once Daily 6)  Metformin Hcl 1000 Mg  Tabs (Metformin Hcl) .[name].. 1 Two Times A Day 7)  Metoprolol Succinate 100 Mg Xr24h-Tab (Metoprolol Succinate) .... Take One Tablet Once Daily 8)  Sanctura 20 Mg  Tabs (Trospium Chloride) .... Two Times A Day 9)  Tylenol Extra Strength 500 Mg  Tabs (Acetaminophen) .... As Needed 10)  Miralax   Powd (Polyethylene Glycol 3350) .[name].. 1 Capful As Needed 11)   Stool Softener 100 Mg Caps (Docusate Sodium) .... 2  Every Morning and 2 At Bedtime 12)  Actos 30 Mg Tabs (Pioglitazone Hcl) .[name].. 1 By Mouth Once Daily 13)  Levothyroxine Sodium 100 Mcg Tabs (Levothyroxine Sodium) .[name].. 1 Tablet By Mouth Q Am 14)  Pen Needles 31g X 8 Mm Misc (Insulin Pen Needle) .... Use As Directed 15)  Engage Study Drug .... Take As Directed By Research Nurse 16)  Diazepam 2 Mg Tabs (Diazepam) .... Take One Tablet Once Daily  Allergies (verified): 1)  Penicillin G Potassium (Penicillin G Potassium)  Past History:  Past Medical History: Last updated: 10/15/2009 FAMILY HX COLON CANCER (ICD-V16.0) CONSTIPATION (ICD-564.00) TWITCHING (ICD-781.0) VITAMIN B12 DEFICIENCY (ICD-266.2) DIAB W/NEURO MANIFESTS TYPE II/UNS NOT UNCNTRL (ICD-250.60) CLAUDICATION (ICD-443.9) DYSEQUILIBRIUM (ICD-780.4) BENIGN PROSTATIC HYPERTROPHY, WITH URINARY OBSTRUCTION (ICD-600.01) SLEEP APNEA (ICD-780.57) OBESITY (ICD-278.00) HYPERLIPIDEMIA, MIXED (ICD-272.2) ATRIAL FIBRILLATION (ICD-427.31) Chest pain   --Myoview 8/07: Not gated. No ischemia or infarct. Diastolic HF    --Echo 3/09: EF 60% mild AI/MR. RV mild to moderately dilated/dysfunction. ?restrictive CM. RVSP 36 DIABETES MELLITUS, TYPE I, UNCONTROLLED (ICD-250.03)  Review of Systems       As per HPI and past medical history; otherwise all systems negative.   Vital Signs:  Patient profile:   77 year old male Height:      72 inches Weight:      253 pounds BMI:     34.44 Pulse rate:   89 /  minute Pulse rhythm:   irregular BP sitting:   108 / 52  (left arm) Cuff size:   large  Vitals Entered By: Judithe Modest CMA (August 04, 2010 12:14 PM)  Physical Exam  General:  Elderly. Somewhat chronically ill appearing.  No acute distress i walked him with pulse-ox 94% rest. 92% withexertion HEENT: normal except for peri-oral cyanosis Neck: thick supple. JVP 8-10. Carotids 2+ bilat; no bruits. No lymphadenopathy or thryomegaly  appreciated. Cor: PMI nonpalpable. Irregular. No rubs, gallops, murmur. Lungs: clear Abdomen: obese. soft, nontender, nondistended. No hepatosplenomegaly. No bruits or masses. Good bowel sounds. Extremities: n  cyanosis, clubbing, rash. extensis chonic venous stasis changes with shiny frail skins 2+ ankle edema Neuro: alert & orientedx3, cranial nerves grossly intact. moves all 4 extremities w/o difficulty. affect pleasant    Impression & Recommendations:  Problem # 1:  ATRIAL FIBRILLATION (ICD-427.31) Stable. Occasional palpitations but rate today well controlled even with ambulation. If palpitatiosn get more frequent will place monitor. Anticoagulation as per Engage trial.  Problem # 2:  Edema He appears to have signifcant right heart failure which may be due to pulm HTN or restrictive physiology. Will recheck echo. Place compression stockings. May need RHC at some point.   Other Orders: TLB-BMP (Basic Metabolic Panel-BMET) (80048-METABOL) EKG w/ Interpretation (93000) Echocardiogram (Echo)  Patient Instructions: 1)  Labs today 2)  Your physician has requested that you have an echocardiogram.  Echocardiography is a painless test that uses sound waves to create images of your heart. It provides your doctor with information about the size and shape of your heart and how well your heart's chambers and valves are working.  This procedure takes approximately one hour. There are no restrictions for this procedure. 3)  Your physician wants you to follow-up in:  9 months.  You will receive a reminder letter in the mail two months in advance. If you don't receive a letter, please call our office to schedule the follow-up appointment.

## 2011-01-04 NOTE — Assessment & Plan Note (Signed)
Summary: EARS FEEL STUFFED--STC   Vital Signs:  Patient profile:   77 year old male Height:      72 inches Weight:      253 pounds BMI:     34.44 O2 Sat:      96 % on Room air Temp:     97.7 degrees F oral Pulse rate:   74 / minute BP sitting:   120 / 64  (left arm) Cuff size:   large  Vitals Entered By: Bill Salinas CMA (December 28, 2009 4:11 PM)  O2 Flow:  Room air CC: pt here with c/o " Ears feeling stopped up" x 3 days/ ab   Primary Care Provider:  Oliver Barre MD  CC:  pt here with c/o " Ears feeling stopped up" x 3 days/ ab.  History of Present Illness:  c/o progressive hearing loss. No fever or chills, no ear pain.   Current Medications (verified): 1)  Digoxin 0.25 Mg  Tabs (Digoxin) .[name].. 1 Once Daily 2)  Doxazosin Mesylate 4 Mg  Tabs (Doxazosin Mesylate) .[name].. 1 Once Daily 3)  Furosemide 20 Mg  Tabs (Furosemide) .[name].. 1 Once Daily 4)  Lantus Solostar 100 Unit/ml  Soln (Insulin Glargine) .[name].. 100 Units At Bedtime 5)  Lovastatin 20 Mg  Tabs (Lovastatin) .[name].. 1 Once Daily 6)  Metformin Hcl 1000 Mg  Tabs (Metformin Hcl) .[name].. 1 Two Times A Day 7)  Metoprolol Succinate 200 Mg  Tb24 (Metoprolol Succinate) .[name].. 1 Once Daily 8)  Potassium Citrate 1080 Mg  Tbcr (Potassium Citrate) .... Take 1 Tablet By Mouth Three Times A Day 9)  Sanctura 20 Mg  Tabs (Trospium Chloride) .... Two Times A Day 10)  Tylenol Extra Strength 500 Mg  Tabs (Acetaminophen) .... As Needed 11)  Miralax   Powd (Polyethylene Glycol 3350) .[name].. 1 Capful As Needed 12)  Stool Softener 100 Mg Caps (Docusate Sodium) .... 2  Every Morning and 2 At Bedtime 13)  Actos 30 Mg Tabs (Pioglitazone Hcl) .[name].. 1 By Mouth Once Daily 14)  Doxycycline Hyclate 100 Mg Caps (Doxycycline Hyclate) .[name].. 1 By Mouth Two Times A Day X 10 Days For Toe Infection 15)  Levothyroxine Sodium 100 Mcg Tabs (Levothyroxine Sodium) .[name].. 1 Tablet By Mouth Q Am 16)  Pen Needles 31g X 8 Mm Misc (Insulin Pen Needle) .... Use As Directed 17)   Amitriptyline Hcl 50 Mg Tabs (Amitriptyline Hcl) .[name].. 1 By Mouth At Bedtime For Burning Feet 18)  Engage Study Drug .... Take As Directed By Research Nurse  Allergies (verified): 1)  Penicillin G Potassium (Penicillin G Potassium) PMH-FH-SH reviewed-no changes except otherwise noted  Review of Systems       The patient complains of decreased hearing.  The patient denies anorexia, fever, weight loss, hoarseness, chest pain, dyspnea on exertion, prolonged cough, hemoptysis, abdominal pain, severe indigestion/heartburn, muscle weakness, difficulty walking, and depression.    Physical Exam  General:  overweight white male Head:  normocephalic and atraumatic.   Ears:  after irrigation - right EAC with some inflammation, TM nl. left EAC with minimal residual cerumen -- TM normal   Impression & Recommendations:  Problem # 1:  CERUMEN IMPACTION, BILATERAL (ICD-380.4) Hearing loss due to cerumen impaction resolved with irrigation.   Complete Medication List: 1)  Digoxin 0.25 Mg Tabs (Digoxin) .[name].. 1 once daily 2)  Doxazosin Mesylate 4 Mg Tabs (Doxazosin mesylate) .[name].. 1 once daily 3)  Furosemide 20 Mg Tabs (Furosemide) .[name].. 1 once daily 4)  Lantus  Solostar 100 Unit/ml Soln (Insulin glargine) .[name].. 100 units at bedtime 5)  Lovastatin 20 Mg Tabs (Lovastatin) .[name].. 1 once daily 6)  Metformin Hcl 1000 Mg Tabs (Metformin hcl) .[name].. 1 two times a day 7)  Metoprolol Succinate 200 Mg Tb24 (Metoprolol succinate) .[name].. 1 once daily 8)  Potassium Citrate 1080 Mg Tbcr (Potassium citrate) .... Take 1 tablet by mouth three times a day 9)  Sanctura 20 Mg Tabs (Trospium chloride) .... Two times a day 10)  Tylenol Extra Strength 500 Mg Tabs (Acetaminophen) .... As needed 11)  Miralax Powd (Polyethylene glycol 3350) .[name].. 1 capful as needed 12)  Stool Softener 100 Mg Caps (Docusate sodium) .... 2  every morning and 2 at bedtime 13)  Actos 30 Mg Tabs (Pioglitazone hcl) .[name].. 1 by mouth once daily 14)   Levothyroxine Sodium 100 Mcg Tabs (Levothyroxine sodium) .[name].. 1 tablet by mouth q am 15)  Pen Needles 31g X 8 Mm Misc (Insulin pen needle) .... Use as directed 16)  Amitriptyline Hcl 50 Mg Tabs (Amitriptyline hcl) .[name].. 1 by mouth at bedtime for burning feet 17)  Engage Study Drug  .... Take as directed by research nurse

## 2011-01-04 NOTE — Letter (Signed)
Summary: [facility]   Imported By: Lester [place] 01/08/2010 07:24:19  _____________________________________________________________________  External Attachment:    Type:   Image     Comment:   External Document

## 2011-01-04 NOTE — Progress Notes (Signed)
Summary: furosemide  Phone Note Refill Request Message from:  Fax from Pharmacy on January 20, 2010 4:14 PM  Refills Requested: Medication #1:  FUROSEMIDE 20 MG  TABS 1 once daily [facility] [phone]   Method Requested: Fax to Mail Away Pharmacy Initial call taken by: Orlan Leavens,  January 20, 2010 4:14 PM  Follow-up for Phone Call        sent renewal to [facility] Follow-up by: Orlan Leavens,  January 20, 2010 4:16 PM    Prescriptions: FUROSEMIDE 20 MG  TABS (FUROSEMIDE) 1 once daily  #90 x 1   Entered by:   Orlan Leavens   Authorized by:   Jacques Navy MD   Signed by:   Orlan Leavens on 01/20/2010   Method used:   Faxed to ...       [facility]* (mail-order)             ,          Ph: [phone]       Fax: [phone]   RxID:   [id number]

## 2011-01-04 NOTE — Letter (Signed)
Summary: [facility]  [facility]   Imported By: Lester [place] 01/08/2010 07:26:12  _____________________________________________________________________  External Attachment:    Type:   Image     Comment:   External Document

## 2011-01-04 NOTE — Progress Notes (Signed)
Summary: OV TOMORROW  Phone Note Call from Patient Call back at Home Phone [phone] Call back at Work Phone [phone]   Summary of Call: Patient is requesting a call back regarding his diabetes.  Initial call taken by: Lamar Sprinkles, CMA,  August 11, 2010 11:03 AM  Follow-up for Phone Call        Pt c/o frequent low cbg's in the early am hours. Scheduled for office visit tomorrow for eval of diabetes management.  Follow-up by: Lamar Sprinkles, CMA,  August 11, 2010 12:22 PM

## 2011-01-04 NOTE — Assessment & Plan Note (Signed)
Summary: discuss cbgs/Sd   Vital Signs:  Patient profile:   77 year old male Height:      72 inches Weight:      254 pounds BMI:     34.57 O2 Sat:      95 % on Room air Temp:     96.7 degrees F oral Pulse rate:   72 / minute BP sitting:   122 / 78  (left arm) Cuff size:   large  Vitals Entered By: Bill Salinas CMA (August 12, 2010 9:08 AM)  O2 Flow:  Room air CC: pt here for f/u on diabetes/ ab Comments Pt has been taking Potassium Citrate 1000mg  1 qam and 1 qpm. Pt questions if he should stop and states he will discuss with Dr Isabel Caprice. Pt will get flu shot today/ ab   Primary Care Provider:  Oliver Barre MD  CC:  pt here for f/u on diabetes/ ab.  History of Present Illness: Patient presents because he has been having hypoglycemic events more frequently - almost daily. He will have an episode at 3-4 AM. He reports that his AM CBG is in the 140 range. He doesn't eat until after 10:00 AM with supper at 1800hr. He does frequently have a snack after midnight when he awakens for micturition. He has not checked his CBG at the times of a hypoglycemic event.  Last A1C in Nov '10 was 6.7%.   He continues in a research protocol for anticoagulation. Aug 22nd his serum gljucose was 183.  He is generally feeling well. He has been taken of potassium citrate. He is advised to drink appfroximately 1 tablespoon of lemon juice in water daily to acidify the urine and reduce risk of stone.   Current Medications (verified): 1)  Digoxin 0.25 Mg  Tabs (Digoxin) .[name].. 1 Once Daily 2)  Doxazosin Mesylate 4 Mg  Tabs (Doxazosin Mesylate) .[name].. 1 Once Daily 3)  Furosemide 20 Mg  Tabs (Furosemide) .[name].. 1 Once Daily 4)  Lantus Solostar 100 Unit/ml  Soln (Insulin Glargine) .[name].. 100 Units At Bedtime 5)  Lovastatin 20 Mg  Tabs (Lovastatin) .[name].. 1 Once Daily 6)  Metformin Hcl 1000 Mg  Tabs (Metformin Hcl) .[name].. 1 Two Times A Day 7)  Metoprolol Succinate 100 Mg Xr24h-Tab (Metoprolol Succinate) .... Take One  Tablet Once Daily 8)  Sanctura 20 Mg  Tabs (Trospium Chloride) .... Two Times A Day 9)  Tylenol Extra Strength 500 Mg  Tabs (Acetaminophen) .... As Needed 10)  Miralax   Powd (Polyethylene Glycol 3350) .[name].. 1 Capful As Needed 11)  Stool Softener 100 Mg Caps (Docusate Sodium) .... 2  Every Morning and 2 At Bedtime 12)  Actos 30 Mg Tabs (Pioglitazone Hcl) .[name].. 1 By Mouth Once Daily 13)  Levothyroxine Sodium 100 Mcg Tabs (Levothyroxine Sodium) .[name].. 1 Tablet By Mouth Q Am 14)  Pen Needles 31g X 8 Mm Misc (Insulin Pen Needle) .... Use As Directed 15)  Engage Study Drug .... Take As Directed By Research Nurse 16)  Diazepam 2 Mg Tabs (Diazepam) .... Take One Tablet Once Daily  Allergies (verified): 1)  Penicillin G Potassium (Penicillin G Potassium)  Past History:  Past Medical History: Last updated: 10/15/2009 FAMILY HX COLON CANCER (ICD-V16.0) CONSTIPATION (ICD-564.00) TWITCHING (ICD-781.0) VITAMIN B12 DEFICIENCY (ICD-266.2) DIAB W/NEURO MANIFESTS TYPE II/UNS NOT UNCNTRL (ICD-250.60) CLAUDICATION (ICD-443.9) DYSEQUILIBRIUM (ICD-780.4) BENIGN PROSTATIC HYPERTROPHY, WITH URINARY OBSTRUCTION (ICD-600.01) SLEEP APNEA (ICD-780.57) OBESITY (ICD-278.00) HYPERLIPIDEMIA, MIXED (ICD-272.2) ATRIAL FIBRILLATION (ICD-427.31) Chest pain   --Myoview 8/07: Not  gated. No ischemia or infarct. Diastolic HF    --Echo 3/09: EF 60% mild AI/MR. RV mild to moderately dilated/dysfunction. ?restrictive CM. 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 SO in fair health Tobacco Use - Former.  Alcohol Use - no Drug Use - no  Review of Systems  The patient denies anorexia, fever, weight loss, weight gain, chest pain, syncope, prolonged cough, hemoptysis, abdominal pain, severe indigestion/heartburn, incontinence, muscle weakness, and depression.    Physical Exam  General:  elderly overweight white male in no distress Head:  normocephalic and atraumatic.   Eyes:  C&S clear Neck:  supple and full ROM.   Lungs:  normal respiratory effort and normal breath sounds.   Heart:  IRIR rate - controlled Skin:  chronic skin changes right ankle - question of lipoidica diabeticorum Psych:  Oriented X3, memory intact for recent and remote, normally interactive, and good eye contact.     Impression & Recommendations:  Problem # 1:  DIAB W/NEURO MANIFESTS TYPE II/UNS NOT UNCNTRL (ICD-250.60)  Patient with hypoglycemic events.  Plan - regular meals and bedtime snack           reduce lantus to 75 units           continue actos and metformin           A1C today  His updated medication list for this problem includes:    Lantus Solostar 100 Unit/ml Soln (Insulin glargine) .Marland KitchenMarland KitchenMarland KitchenMarland Kitchen 75 units at bedtime    Metformin Hcl 1000 Mg Tabs (Metformin hcl) .[name]... 1 two times a day    Actos 30 Mg Tabs (Pioglitazone hcl) .[name]... 1 by mouth once daily  Orders: TLB-A1C / Hgb A1C (Glycohemoglobin) (83036-A1C)  Complete Medication List: 1)  Digoxin 0.25 Mg Tabs (Digoxin) .[name].. 1 once daily 2)  Doxazosin Mesylate 4 Mg Tabs (Doxazosin mesylate) .[name].. 1 once daily 3)  Furosemide 20 Mg Tabs (Furosemide) .[name].. 1 once daily 4)  Lantus Solostar 100 Unit/ml Soln (Insulin glargine) .... 75 units at bedtime 5)  Lovastatin 20 Mg Tabs (Lovastatin) .[name].. 1 once daily 6)  Metformin Hcl 1000 Mg Tabs (Metformin hcl) .[name].. 1 two times a day 7)  Metoprolol Succinate  100 Mg Xr24h-tab (Metoprolol succinate) .... Take one tablet once daily 8)  Sanctura 20 Mg Tabs (Trospium chloride) .... Two times a day 9)  Tylenol Extra Strength 500 Mg Tabs (Acetaminophen) .... As needed 10)  Miralax Powd (Polyethylene glycol 3350) .[name].. 1 capful as needed 11)  Stool Softener 100 Mg Caps (Docusate sodium) .... 2  every morning and 2 at bedtime 12)  Actos 30 Mg Tabs (Pioglitazone hcl) .[name].. 1 by mouth once daily 13)  Levothyroxine Sodium 100 Mcg Tabs (Levothyroxine sodium) .[name].. 1 tablet by mouth q am 14)  Pen Needles 31g X 8 Mm Misc (Insulin pen needle) .... Use as directed 15)  Engage Study Drug  .... Take as directed by research nurse 16)  Diazepam 2 Mg Tabs (Diazepam) .... Take one  tablet once daily  Other Orders: Flu Vaccine 14yrs + MEDICARE PATIENTS (Z6109) Administration Flu vaccine - MCR (U0454)  Patient Instructions: 1)  Diabetes control - to avoid low blood sugars please eat regularly spaced meals including a bedtime snack. Higher protein and lower carbohydrates is always preferrable. Reduce Lantus to 75 units at bedtime; continue actos and metformin at current doses. Will check A1C today for an overview of control over the past 3 months.  2)  Kidney stone prevention - drink in water the juice of 1 lemon or 1 tablespoon of real lemon daily to help acidify the urine and prevent stone formation.   Flu Vaccine Consent Questions     Do you have a history of severe allergic reactions to this vaccine? no    Any prior history of allergic reactions to egg and/or gelatin? no    Do you have a sensitivity to the preservative Thimersol? no    Do you have a past history of Guillan-Barre Syndrome? no    Do you currently have an acute febrile illness? no    Have you ever had a severe reaction to latex? no    Vaccine information given and explained to patient? yes    Are you currently pregnant? no    Lot Number:[id number]   Exp Date:06/04/2011   Site Given  Left Deltoid IMedflu

## 2011-01-04 NOTE — Letter (Signed)
Summary: [facility]  [facility]   Imported By: Marylou Mccoy 05/12/2010 12:03:32  _____________________________________________________________________  External Attachment:    Type:   Image     Comment:   External Document

## 2011-01-04 NOTE — Progress Notes (Signed)
   Faxed LOV,Labs over to Brandy/Dr.Grapey @ [phone] [facility]  August 11, 2010 9:14 AM

## 2011-01-04 NOTE — Progress Notes (Signed)
   Phone Note Outgoing Call   Details for Reason: Labs Summary of Call: I contacted patient to let him know that renal function is mildly elevated per labs drawn for the Engage Study at Month 9. Pt was notified to have labs repeated in 2 weeks per Dr. Gala Romney. I will have him come in on August 22 for BMP.

## 2011-01-04 NOTE — Progress Notes (Signed)
  Prescriptions: PEN NEEDLES 31G X 8 MM MISC (INSULIN PEN NEEDLE) use as directed  #100 x 1   Entered by:   Lamar Sprinkles, CMA   Authorized by:   Jacques Navy MD   Signed by:   Lamar Sprinkles, CMA on 01/14/2010   Method used:   Faxed to ...       [facility]* (mail-order)             ,          Ph: [phone]       Fax: [phone]   RxID:   [id number]

## 2011-01-04 NOTE — Letter (Signed)
Summary: [facility]  [facility]   Imported By: Lennie Odor 07/09/2010 17:13:45  _____________________________________________________________________  External Attachment:    Type:   Image     Comment:   External Document

## 2011-01-04 NOTE — Miscellaneous (Signed)
Summary: Appointment Canceled  Appointment status changed to canceled by [employer] on 08/25/2010 11:30 AM.  Cancellation Comments --------------------- echo dx 793.2/bcbs medicare ppo  Appointment Information ----------------------- Appt Type:  CARDIOLOGY ANCILLARY VISIT      Date:  Wednesday, August 25, 2010      Time:  10:30 AM for 60 min   Urgency:  Routine   Made By:  Hoy Finlay Scheduler  To Visit:  [id number]    Reason:  echo dx 793.2/bcbs medicare ppo  Appt Comments ------------- -- 08/25/10 11:30: (CEMR) CANCELED -- echo dx 793.2/bcbs medicare ppo -- 08/25/10 11:20: (CEMR) ARRIVED -- echo dx 793.2/bcbs medicare ppo -- 08/04/10 13:02: (CEMR) BOOKED -- Routine CARDIOLOGY ANCILLARY VISIT at 08/25/2010 10:30 AM for 60 min echo dx

## 2011-01-06 NOTE — Assessment & Plan Note (Signed)
Summary: f/u appt/#/cd   Vital Signs:  Patient profile:   77 year old male Height:      72 inches Weight:      251 pounds BMI:     34.16 O2 Sat:      95 % on Room air Temp:     97.1 degrees F oral Pulse rate:   86 / minute BP sitting:   106 / 68  (left arm) Cuff size:   large  Vitals Entered By: Bill Salinas CMA (November 18, 2010 11:07 AM)  O2 Flow:  Room air  Primary Care Michalene Debruler:  Oliver Barre MD   History of Present Illness: Patient presents wiht a chief complaint of poor sleep and chronic fatigue. Due to his multiple problems he sleeps in his recliner in the den. He is getting up 3-5 times a night to urinate and cannot return to sleep.   Current Medications (verified): 1)  Digoxin 0.25 Mg  Tabs (Digoxin) .[name].. 1 Once Daily 2)  Doxazosin Mesylate 4 Mg  Tabs (Doxazosin Mesylate) .[name].. 1 Once Daily 3)  Furosemide 20 Mg  Tabs (Furosemide) .[name].. 1 Once Daily 4)  Lantus Solostar 100 Unit/ml  Soln (Insulin Glargine) .... 70 Units At Bedtime 5)  Lovastatin 20 Mg  Tabs (Lovastatin) .[name].. 1 Once Daily 6)  Metformin Hcl 1000 Mg  Tabs (Metformin Hcl) .[name].. 1 Two Times A Day 7)  Metoprolol Succinate 100 Mg Xr24h-Tab (Metoprolol Succinate) .... Take One Tablet Once Daily 8)  Sanctura 20 Mg  Tabs (Trospium Chloride) .... Two Times A Day 9)  Tylenol Extra Strength 500 Mg  Tabs (Acetaminophen) .... As Needed 10)  Miralax   Powd (Polyethylene Glycol 3350) .[name].. 1 Capful As Needed 11)  Stool Softener 100 Mg Caps (Docusate Sodium) .... 2  Every Morning and 2 At Bedtime 12)  Actos 30 Mg Tabs (Pioglitazone Hcl) .[name].. 1 By Mouth Once Daily 13)  Levothyroxine Sodium 100 Mcg Tabs (Levothyroxine Sodium) .[name].. 1 Tablet By Mouth Q Am 14)  Pen Needles 31g X 8 Mm Misc (Insulin Pen Needle) .... Use As Directed 15)  Engage Study Drug .... Take As Directed By Research Nurse 16)  Diazepam 2 Mg Tabs (Diazepam) .... Take One Tablet Once Daily  Allergies (verified): 1)  Penicillin G Potassium (Penicillin G  Potassium)  Past History:  Past Medical History: Last updated: 10/15/2009 FAMILY HX COLON CANCER (ICD-V16.0) CONSTIPATION (ICD-564.00) TWITCHING (ICD-781.0) VITAMIN B12 DEFICIENCY (ICD-266.2) DIAB W/NEURO MANIFESTS TYPE II/UNS NOT UNCNTRL (ICD-250.60) CLAUDICATION (ICD-443.9) DYSEQUILIBRIUM (ICD-780.4) BENIGN PROSTATIC HYPERTROPHY, WITH URINARY OBSTRUCTION (ICD-600.01) SLEEP APNEA (ICD-780.57) OBESITY (ICD-278.00) HYPERLIPIDEMIA, MIXED (ICD-272.2) ATRIAL FIBRILLATION (ICD-427.31) Chest pain   --Myoview 8/07: Not gated. No ischemia or infarct. Diastolic HF    --Echo 3/09: EF 60% mild AI/MR. RV mild to moderately dilated/dysfunction. ?restrictive CM. 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 SO in fair health Tobacco Use - Former.  Alcohol Use - no Drug Use - no  Review of Systems  The patient complains of difficulty walking.  The patient denies anorexia, fever, weight loss, weight gain, chest pain, dyspnea on exertion, peripheral edema, abdominal pain, hematochezia, muscle weakness, depression, and angioedema.         memory loss  Physical Exam  General:  large framed white male in no distress Head:  normocephalic and no abnormalities observed.   Eyes:  pupils equal and pupils round.   Lungs:  normal respiratory effort and normal breath sounds.   Heart:  normal rate and regular rhythm.     Neurologic:  alert & oriented X3, cranial nerves II-XII intact, and strength normal in all extremities.  Hard of hearing Skin:  turgor normal and color normal.   Psych:  Oriented X3, normally interactive, and good eye contact.     Impression & Recommendations:  Problem # 1:  SLEEP APNEA (ICD-780.57) Patient with sleep disorder that is multifactorial: nocturia, sleep apnea, poor sleep hygiene. Reviewed principles of sleep hygiene especially sleep santuary and extinction  behaviors.  Plan - see  BPH below           work on better sleep hygiene           referral to pulmonary for sleep disorder consult.   Orders: Sleep Disorder Referral (Sleep Disorder)  (greater than 50% of a 30 minute visit spent on education and counseling)  Problem # 2:  BENIGN PROSTATIC HYPERTROPHY, WITH URINARY OBSTRUCTION (ICD-600.01) Patient with nocturia x 3-5.  Plan - change doxazosin to tamsulosin for better relief.   His updated medication list for this problem includes:    Tamsulosin Hcl 0.4 Mg Caps (Tamsulosin hcl) .[name]... 1 by mouth at bedtime  Complete Medication List: 1)  Digoxin 0.25 Mg Tabs (Digoxin) .[name].. 1 once daily 2)  Tamsulosin Hcl 0.4 Mg Caps (Tamsulosin hcl) .[name].. 1 by mouth at bedtime 3)  Furosemide 20 Mg Tabs (Furosemide) .[name].. 1 once daily 4)  Lantus Solostar 100 Unit/ml Soln (Insulin glargine) .... 70 units at bedtime 5)  Lovastatin 20 Mg Tabs (Lovastatin) .[name].. 1 once daily 6)  Metformin Hcl 1000 Mg Tabs (Metformin hcl) .[name].. 1 two times a day 7)  Metoprolol Succinate 100 Mg Xr24h-tab (Metoprolol succinate) .... Take one tablet once daily 8)  Sanctura 20 Mg Tabs (Trospium chloride) .... Two times a day 9)  Tylenol Extra Strength 500 Mg Tabs (Acetaminophen) .... As needed 10)  Miralax Powd (Polyethylene glycol 3350) .[name].. 1 capful as needed 11)  Stool Softener 100 Mg Caps (Docusate sodium) .... 2  every morning and 2 at bedtime 12)  Actos 30 Mg Tabs (Pioglitazone hcl) .[name].. 1 by mouth once  daily 13)  Levothyroxine Sodium 100 Mcg Tabs (Levothyroxine sodium) .[name].. 1 tablet by mouth q am 14)  Pen Needles 31g X 8 Mm Misc (Insulin pen needle) .... Use as directed 15)  Engage Study Drug  .... Take as directed by research nurse 16)  Diazepam 2 Mg Tabs (Diazepam) .... Take one tablet once daily  Other Orders: TLB-A1C / Hgb A1C (Glycohemoglobin) (83036-A1C) TLB-BMP (Basic Metabolic Panel-BMET) (80048-METABOL) TLB-B12 + Folate Pnl (82746_82607-B12/FOL) TLB-Lipid Panel (80061-LIPID) TLB-Hepatic/Liver Function Pnl (80076-HEPATIC)  Patient Instructions: 1)  Sleep problems: 1) will change doxazosin to tamsulosin at bedtime so that you will hopefully not have to get up so many times at night to urinate; 2) the whole concept of a sleep sanctuary and reconditioning your sleep habit - do not stay in your recliner if you cannot sleep; 3) reconnect with Dr. Maple Hudson to resume  treatment for severe sleep apnea.  Prescriptions: TAMSULOSIN HCL 0.4 MG CAPS (TAMSULOSIN HCL) 1 by mouth at bedtime  #30 x 12   Entered and Authorized by:   Jacques Navy MD   Signed by:   Jacques Navy MD on 11/18/2010   Method used:   Electronically to        [facility]* (retail)       [address] [place], [place]  [address]       Ph: [phone] or [phone]       Fax: [phone]   RxID:   [id number]    Orders Added: 1)  TLB-A1C / Hgb A1C (Glycohemoglobin) [83036-A1C] 2)  TLB-BMP (Basic Metabolic Panel-BMET) [80048-METABOL] 3)  TLB-B12 + Folate Pnl [82746_82607-B12/FOL] 4)  TLB-Lipid Panel [80061-LIPID] 5)  TLB-Hepatic/Liver Function Pnl [80076-HEPATIC] 6)  Sleep Disorder Referral [Sleep Disorder] 7)  Est. Patient Level IV [[address]]

## 2011-01-06 NOTE — Progress Notes (Addendum)
Summary: pt has a-fib heart is beating hard   Phone Note Call from Patient Call back at Home Phone [phone]   Caller: Patient Reason for Call: Talk to Nurse, Talk to Doctor Summary of Call: pt experieced a really hard heart beat and he is very concerned he was dizzy Initial call taken by: Omer Jack,  December 31, 2010 8:44 AM  Follow-up for Phone Call        Phone Call Completed PER PT C/O HEART BEATING HARD  COULD FEEL IT IN HEAD ALSO C/O BRIEF  EPISODE OF VISUAL DISRTURBANCE ALL SYMPTOMS HAVE SUBSIDED AT THIS TIME PER PT FEELS GREAT. B/P 157/102 HR 61 HAS NOT HAD AM MEDS  INSTRUCTED PT TO GO AHEAD AND TAKE AM MEDS .PER [facility] PAC HAVE PT COME TO OFFICE FOR EKG PT AWARE AND AND IS EN ROUTE. Follow-up by: Scherrie Bateman, LPN,  December 31, 2010 9:19 AM     Appended Document: pt has a-fib heart is beating hard please place 2 week event monitor   Appended Document: pt has a-fib heart is beating hard pt seen 2/6

## 2011-01-06 NOTE — Assessment & Plan Note (Signed)
Summary: OSA/MHH   Copy to:  N/A Primary Provider/Referring Provider:  Oliver Barre MD  CC:  Sleep consult-Dr. Laney Pastor establish form 2008.[name]  History of Present Illness: 08/16/07- PROBLEM LIST: 1. Obstructive sleep apnea. 2. Arthritis/hip pain. 3. Atrial fibrillation. 4. Diabetes.  HISTORY:  He says he has been doing much better since he has been using an auto-titration BiPAP machine.  He never turned the chip in.  He still takes the mask off after he gets up for bathroom in the middle of the night, but he says he is satisfied with this machine.  He still does not like the mask a great deal, and we discussed options.  He really likes the humidifier.  He does not feel more rested, still sleeping every night in a recliner.   Dec 27, 2010 OSA, AFIB,  PHTN, Diast Dysfunction Nurse-CC: Sleep consult-Dr. Laney Pastor establish from 2008. Incidental- Reports epistaxis x 3 days, L nostril  finally ended last night. He is on a study drug anticoagulant through [facility] card. No other bleed.  OSA- originally dx'd NPSG 05/14/07- severe OSA, AHI 80.7/hr. Gave up on CPAP then autoBIPAP after only a few months in 2008, finding it bothersome and not helpful. Wife tells him he holds his breath a lot. He has always snored. See comments about sleep and AutoBiPAP reviewed from 2008 note. Stil can't sleep lying down- prefers recliner. Nocturia x 3-5 or even 6, and hip pain disturb sleep.  Bedtime 9-10 PM, latency few minutes, up 830AM. When awake breathing seems ok.     Preventive Screening-Counseling & Management  Alcohol-Tobacco     Smoking Status: quit     Packs/Day: 1.0     Year Quit: 1987     Pack years: 40 years   Current Medications (verified): 1)  Digoxin 0.25 Mg  Tabs (Digoxin) .[name].. 1 Once Daily 2)  Tamsulosin Hcl 0.4 Mg Caps (Tamsulosin Hcl) .[name].. 1 By Mouth At Bedtime 3)  Furosemide 20 Mg  Tabs (Furosemide) .[name].. 1 Once Daily 4)  Lantus Solostar 100 Unit/ml  Soln (Insulin Glargine) ....  70 Units At Bedtime 5)  Lovastatin 20 Mg  Tabs (Lovastatin) .[name].. 1 Once Daily 6)  Metformin Hcl 1000 Mg  Tabs (Metformin Hcl) .[name].. 1 Two Times A Day 7)  Metoprolol Succinate 100 Mg Xr24h-Tab (Metoprolol Succinate) .... Take One Tablet Once Daily 8)  Sanctura 20 Mg  Tabs (Trospium Chloride) .... Two Times A Day 9)  Tylenol Extra Strength 500 Mg  Tabs (Acetaminophen) .... As Needed 10)  Miralax   Powd (Polyethylene Glycol 3350) .[name].. 1 Capful As Needed 11)  Stool Softener 100 Mg Caps (Docusate Sodium) .... 2  Every Morning and 2 At Bedtime 12)  Actos 30 Mg Tabs (Pioglitazone Hcl) .[name].. 1 By Mouth Once Daily 13)  Levothyroxine Sodium 100 Mcg Tabs (Levothyroxine Sodium) .[name].. 1 Tablet By Mouth Q Am 14)  Pen Needles 31g X 8 Mm Misc (Insulin Pen Needle) .... Use As Directed 15)  Engage Study Drug .... Take As Directed By Research Nurse 16)  Diazepam 2 Mg Tabs (Diazepam) .... Take One Tablet Once Daily 17)  Potassium Citrate 10 Meq (1080 Mg) Cr-Tabs (Potassium Citrate) .... Take 1 By Mouth Two Times A Day 18)  Tocradex 0.1-0.3% Sus .... 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 SO in fair health Tobacco Use - Former.  Alcohol Use - no Drug Use - no  Risk Factors: Smoking Status: quit (12/22/2010) Packs/Day: 1.0 (12/22/2010)  Past Medical History: FAMILY HX COLON CANCER (ICD-V16.0) CONSTIPATION (ICD-564.00) TWITCHING (ICD-781.0) VITAMIN B12 DEFICIENCY (ICD-266.2) DIAB W/NEURO MANIFESTS TYPE II/UNS NOT UNCNTRL  (ICD-250.60) CLAUDICATION (ICD-443.9) DYSEQUILIBRIUM (ICD-780.4) BENIGN PROSTATIC HYPERTROPHY, WITH URINARY OBSTRUCTION (ICD-600.01) SLEEP APNEA (ICD-780.57)- NPSG 05/14/07- AHI 80.7/hr OBESITY (ICD-278.00) HYPERLIPIDEMIA, MIXED (ICD-272.2) ATRIAL FIBRILLATION (ICD-427.31) Anticoagulation Chest pain   --Myoview 8/07: Not gated. No ischemia or infarct. Diastolic HF    --Echo 3/09: EF 60% mild AI/MR. RV mild to moderately dilated/dysfunction. ?restrictive CM. RVSP 36 DIABETES MELLITUS, TYPE I, UNCONTROLLED (ICD-250.03) Hx bladder polyps  Past Surgical History: Appendectomy Supraclavicular benign tumor excision Left elbow fx requiring multiple sx for ORIF Kidney stone retrieval with ureteral stent x2 TURBT  Bladder polyps x 3- Dr Isabel Caprice  Family History: father- died 78; CVA, hip fx, pneumonia mother- died 110; lung cancer, nonsmoker sister - colon cancer  uncle-prostate cancer Neg - DM  Social History: Pack years:  40 years  Packs/Day:  1.0  Review of Systems      See HPI       The patient complains of shortness of breath with activity, chest pain, irregular heartbeats, headaches, nasal congestion/difficulty breathing through nose, itching, and hand/feet swelling.  The patient denies shortness of breath at rest, productive cough, non-productive cough, coughing up blood, acid heartburn, indigestion, loss of appetite, weight change, abdominal pain, difficulty swallowing, sore throat, tooth/dental problems, sneezing, ear ache, anxiety, joint stiffness or pain, rash, change in color of mucus, and fever.    Vital Signs:  Patient profile:   77 year old male Height:      72 inches Weight:      252.25 pounds BMI:     34.33 O2 Sat:      96 % on Room air Pulse rate:   84 / minute BP sitting:   124 / 80  (left arm) Cuff size:   large  Vitals Entered By: Reynaldo Minium CMA (December 22, 2010 10:56 AM)  O2 Flow:  Room air CC: Sleep consult-Dr. Laney Pastor establish form  2008.   Physical Exam  Additional Exam:  General: A/Ox3; pleasant and cooperative, NAD, tired appearing, overweight SKIN: no rash, lesions, lips dark- ??dusky NODES: no lymphadenopathy HEENT: [place]/AT, EOM- WNL, Conjuctivae- clear, PERRLA, TM-WNL, Nose-minimal clot left nostril, Throat- clear and wnl NECK: Supple w/ fair ROM, JVD- 1 cm, normal carotid impulses w/o bruits Thyroid- normal to palpation CHEST: Clear to P&A HEART: irregularly irregular, no m/g/r heard ABDOMEN: Soft and nl; nml bowel sounds; no organomegaly or masses noted ZOX:WRUE, nl pulses, 1+ edema, no cyanosis in fingers, no clubbing.  NEURO: Grossly intact to observation      Impression & Recommendations:  Problem # 1:  SLEEP APNEA (ICD-780.57)  He has known severe Obstructive sleep apnea, with original AHI 87/hr in 2008. Part of his intolerance of CPAP/ BiPAP therapy is because he has to keep taking the mask off and on so much for bathroom/ nocturia. . 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 However if he is desaturating significanly in sleep it would help to know that. Any  overnight testing will be affected by frequent disruption during bathroom breaks  We will get ONOX on room air, then get a BiPAP autotitration for reassessment, based on his last settings.  I don't find an old PFT on chart review. He smoked for many years remotely and may have more COPD than appreciated, but resting room air saturation today is good at 96%.   Orders: Consultation Level IV (04540) DME Referral (DME)  Medications Added to Medication List This Visit: 1)  Potassium Citrate 10 Meq (1080 Mg) Cr-tabs (Potassium citrate) .... Take 1 by mouth two times a day 2)  Tocradex 0.1-0.3% Sus  .... As needed eye infection  Patient Instructions: 1)  Schedule return in 6 weeks, earlier as needed 2)  Se PCC to set up home assessment of your oxygen level and sleep anea  during sleep.  3)  cc Dr Debby Bud

## 2011-01-06 NOTE — Letter (Signed)
Summary: [facility] Consult Scheduled Letter  [facility]  [address] [place], [place] [address]   Phone: [phone]  Fax: [phone]      11/18/2010 MRN: [id number]  [facility] [address] [place], [place]  [address]    Dear Mr. Hackler,      We have scheduled an appointment for you.  At the recommendation of Dr.Norins, we have scheduled you a consult with Dr Maple Hudson on 12/22/10 at 10:15am.  Their phone number is [phone].  If this appointment day and time is not convenient for you, please feel free to call the office of the doctor you are being referred to at the number listed above and reschedule the appointment.     [facility] [address] [place], [place] [address] *Pulmonary Dept.2nd Floor*    Please give 24hr notice if you need to cancel/reschedule to avoid a $50.00 fee.Also bring insurance card and any co-pay due at time of visit.   Thank you,  Patient Care Coordinator [facility]

## 2011-01-06 NOTE — Letter (Signed)
[facility] [address] [place], [place]  [address] Phone: [phone]      November 22, 2010   [facility] [address] [place], [place] [address]  RE:  LAB RESULTS  Dear  Mr. Hardaway,  The following is an interpretation of your most recent lab tests.  Please take note of any instructions provided or changes to medications that have resulted from your lab work.  ELECTROLYTES:  Good - no changes needed  KIDNEY FUNCTION TESTS:  Good - no changes needed  LIVER FUNCTION TESTS:  Good - no changes needed  LIPID PANEL:  Good - no changes needed Triglyceride: 107.0   Cholesterol: 116   LDL: 60   HDL: 34.20   Chol/HDL%:  3   DIABETIC STUDIES:  Improved - continue management Blood Glucose: 102   HgbA1C: 7.3   Microalbumin/Creatinine Ratio: 42.4    B12 low at 135  Lab results look pretty good. Watch that sugr and carbohydrate intake.   You need B12 replacement and I recommend monthly shots of B12. Please call and schedule a visit with my clinical assistant Ami.  Please come see me if you have any questions about these lab results.   Sincerely Yours,    Jacques Navy MD Patient: [name] [name] Note: All result statuses are Final unless otherwise noted.  Tests: (1) Hemoglobin A1C (A1C)   Hemoglobin A1C       [H]  7.3 %                       4.6-6.5     Glycemic Control Guidelines for People with Diabetes:     Non Diabetic:  <6%     Goal of Therapy: <7%     Additional Action Suggested:  >8%   Tests: (2) BMP (METABOL)   Sodium                    145 mEq/L                   135-145   Potassium                 4.8 mEq/L                   3.5-5.1   Chloride                  106 mEq/L                   96-112   Carbon Dioxide            30 mEq/L                    19-32   Glucose              [H]  102 mg/dL                   29-56   BUN                  [H]  38 mg/dL                    2-13   Creatinine                1.3 mg/dL  0.4-1.5   Calcium                   9.9 mg/dL                   0.8-65.7   GFR                  [L]  58.92 mL/min                >60.00  Tests: (3) B12 + Folate Panel (B12/FOL)   Vitamin B12          [L]  135 pg/mL                   211-911   Folate                    7.7 ng/mL     Deficient  0.4 - 3.4 ng/mL     Indeterminate  3.4 - 5.4 ng/mL     Normal  >5.4 ng/mL  Tests: (4) Lipid Panel (LIPID)   Cholesterol               116 mg/dL                   8-469     ATP III Classification            Desirable:  < 200 mg/dL                    Borderline High:  200 - 239 mg/dL               High:  > = 240 mg/dL   Triglycerides             107.0 mg/dL                 6.2-952.8     Normal:  <150 mg/dL     Borderline High:  413 - 199 mg/dL   HDL                  [L]  24.40 mg/dL                 >10.27   VLDL Cholesterol          21.4 mg/dL                  2.5-36.6   LDL Cholesterol           60 mg/dL                    4-40  CHO/HDL Ratio:  CHD Risk                             3                    Men          Women     1/2 Average Risk     3.4          3.3     Average Risk          5.0          4.4     2X Average Risk          9.6          7.1     3X  Average Risk          15.0          11.0                           Tests: (5) Hepatic/Liver Function Panel (HEPATIC)   Total Bilirubin           0.9 mg/dL                   1.6-1.0   Direct Bilirubin          0.2 mg/dL                   9.6-0.4   Alkaline Phosphatase      115 U/L                     39-117   AST                       23 U/L                      0-37   ALT                       19 U/L                      0-53   Total Protein             6.5 g/dL                    5.4-0.9   Albumin                   3.5 g/dL                    8.1-1.9

## 2011-01-06 NOTE — Assessment & Plan Note (Addendum)
Summary: ekg./cy  Nurse Visit   Vital Signs:  Patient profile:   77 year old male Height:      72 inches Weight:      253 pounds BMI:     34.44 Pulse rate:   61 / minute Pulse rhythm:   irregular BP sitting:   122 / 82  (left arm) Cuff size:   large  Vitals Entered By: Scherrie Bateman, LPN (December 31, 2010 10:53 AM)  Visit Type:  Follow-up Referring Provider:  N/A Primary Provider:  Oliver Barre MD  CC:  palpitations.  History of Present Illness: PT CALLED THIS AM AND LEFT MESSAGE  WITH C/O HEART BEATING HARD AND CAN FEEL IN HEAD ALSO C/O EPISODE OF VISUAL DISTURBANCE. THIS NURSE RETURN CALL PT STATED FELT GREAT SYMPTOMS HAD SUBSIDED. PT CHECKED  B/P AND HEART RATE WHICH WAS 157/102 AND HR 61  BEFORE AM MEDS WERE TAKEN.DISCUSSED WITH [facility] PAC PT TO COME INTO OFF FOR  EKG AND ALSO NEEDS TO SEE DR BENSIMHON APPT MADE FOR 01/10/11 AT 2:00 PM.   Allergies: 1)  Penicillin G Potassium (Penicillin G Potassium)

## 2011-01-06 NOTE — Progress Notes (Signed)
Summary: CBG ?  Phone Note Call from Patient Call back at Work Phone [phone]   Caller: Patient Summary of Call: Pt left message on triage requesting a callback regarding possible problems with diabetic meter. Pt states that he had an episode of low BS this morning. Pt drank OJ and then checked his CBG-his first reading was 385. Pt then checked his CBG's again and had readings of 175 and 124. Pt is wondering if his meter is malfunctioning or what could be causing the difference in his results-please advise Initial call taken by: Brenton Grills CMA Duncan Dull),  November 25, 2010 9:15 AM  Follow-up for Phone Call        Spoke w/pt, he never checked cbg when he thought his blood sugar was low. 384 reading was after OJ & milk. All other readings today were in normal range for pt. Pt used control solution & meter checked out ok.   Pt always has a snack around 2 am to keep cbg from becoming too low when he wakes up.  New glucometer left upfront for patient so he can verify that his works. This will also give him one to keep at his bedside incase of low blood sugar. Advised him to always try to check cbg when he feels bad or thinks cbg may be low.  Follow-up by: Lamar Sprinkles, CMA,  November 25, 2010 3:52 PM  Additional Follow-up for Phone Call Additional follow up Details #1::        sounds like he has reasonable control. No change in his regimen.  Additional Follow-up by: Jacques Navy MD,  November 25, 2010 4:59 PM

## 2011-01-06 NOTE — Assessment & Plan Note (Signed)
Summary: NOSE BLEEDS OFF AND ON FOR 5 DAYS / BP WENT UP THEN BACK TO 1...   Vital Signs:  Patient profile:   77 year old male Height:      72 inches Weight:      253 pounds BMI:     34.44 O2 Sat:      97 % on Room air Temp:     97.4 degrees F oral Pulse rate:   75 / minute BP sitting:   124 / 84  (right arm) Cuff size:   large  Vitals Entered By: Ami Bullins CMA (December 24, 2010 9:32 AM)  O2 Flow:  Room air CC: pt here for evaluation of nose bleeds off and on x 5 days/ ab   Primary Care Provider:  Oliver Barre MD  CC:  pt here for evaluation of nose bleeds off and on x 5 days/ ab.  History of Present Illness: Patient presents with a 2 day history of epistaxis from the left nostril. He has had nosebleeds x 2-3, usually at night and at rest. He denies any insitgating factors: nose blowing, picking at his nose, etc. He say the bleeding will stop if he leans his head back.  He has not had any bleeding since last night. He does not take any anticoagulant.  Current Medications (verified): 1)  Digoxin 0.25 Mg  Tabs (Digoxin) .[name].. 1 Once Daily 2)  Tamsulosin Hcl 0.4 Mg Caps (Tamsulosin Hcl) .[name].. 1 By Mouth At Bedtime 3)  Furosemide 20 Mg  Tabs (Furosemide) .[name].. 1 Once Daily 4)  Lantus Solostar 100 Unit/ml  Soln (Insulin Glargine) .... 70 Units At Bedtime 5)  Lovastatin 20 Mg  Tabs (Lovastatin) .[name].. 1 Once Daily 6)  Metformin Hcl 1000 Mg  Tabs (Metformin Hcl) .[name].. 1 Two Times A Day 7)  Metoprolol Succinate 100 Mg Xr24h-Tab (Metoprolol Succinate) .... Take One Tablet Once Daily 8)  Sanctura 20 Mg  Tabs (Trospium Chloride) .... Two Times A Day 9)  Tylenol Extra Strength 500 Mg  Tabs (Acetaminophen) .... As Needed 10)  Miralax   Powd (Polyethylene Glycol 3350) .[name].. 1 Capful As Needed 11)  Stool Softener 100 Mg Caps (Docusate Sodium) .... 2  Every Morning and 2 At Bedtime 12)  Actos 30 Mg Tabs (Pioglitazone Hcl) .[name].. 1 By Mouth Once Daily 13)  Levothyroxine Sodium 100 Mcg Tabs  (Levothyroxine Sodium) .[name].. 1 Tablet By Mouth Q Am 14)  Pen Needles 31g X 8 Mm Misc (Insulin Pen Needle) .... Use As Directed 15)  Engage Study Drug .... Take As Directed By Research Nurse 16)  Diazepam 2 Mg Tabs (Diazepam) .... Take One Tablet Once Daily 17)  Potassium Citrate 10 Meq (1080 Mg) Cr-Tabs (Potassium Citrate) .... Take 1 By Mouth Two Times A Day 18)  Tocradex 0.1-0.3% Sus .... As Needed Eye Infection  Allergies (verified): 1)  Penicillin G Potassium (Penicillin G Potassium)  Past History:  Past Medical History: Last updated: 12/22/2010 FAMILY HX COLON CANCER (ICD-V16.0) CONSTIPATION (ICD-564.00) TWITCHING (ICD-781.0) VITAMIN B12 DEFICIENCY (ICD-266.2) DIAB W/NEURO MANIFESTS TYPE II/UNS NOT UNCNTRL (ICD-250.60) CLAUDICATION (ICD-443.9) DYSEQUILIBRIUM (ICD-780.4) BENIGN PROSTATIC HYPERTROPHY, WITH URINARY OBSTRUCTION (ICD-600.01) SLEEP APNEA (ICD-780.57)- NPSG 05/14/07- AHI 80.7/hr OBESITY (ICD-278.00) HYPERLIPIDEMIA, MIXED (ICD-272.2) ATRIAL FIBRILLATION (ICD-427.31) Anticoagulation Chest pain   --Myoview 8/07: Not gated. No ischemia or infarct. Diastolic HF    --Echo 3/09: EF 60% mild AI/MR. RV mild to moderately dilated/dysfunction. ?restrictive CM. RVSP 36 DIABETES MELLITUS, TYPE I, UNCONTROLLED (ICD-250.03) Hx bladder polyps  Past Surgical History:  Last updated: 12/22/2010 Appendectomy Supraclavicular benign tumor excision Left elbow fx requiring multiple sx for ORIF Kidney stone retrieval with ureteral stent x2 TURBT  Bladder polyps x 3- Dr Isabel Caprice FH reviewed for relevance, SH/Risk Factors reviewed for relevance  Review of Systems ENT:  Complains of nosebleeds; denies hoarseness, nasal congestion, and sinus pressure.  Physical Exam  General:  heavy set older white male in no distress Head:  normocephalic and atraumatic.   Nose:  left nostril with a visible scabbed sight on the lateral wall. Not actively bleeding.    Impression &  Recommendations:  Problem # 1:  EPISTAXIS, RECURRENT (ICD-784.7) recurrent bleeding over the past 3 days. visible bleeding spot on wall of nares  the area was illuminated and silver nitrate stick applied for 20 seconds. Patient tolerated well.  Plan - patient advised to avoid blowing his nose with force, not to pick at his nose           for recurrent bleeding will need ENT consult.   Complete Medication List: 1)  Digoxin 0.25 Mg Tabs (Digoxin) .[name].. 1 once daily 2)  Tamsulosin Hcl 0.4 Mg Caps (Tamsulosin hcl) .[name].. 1 by mouth at bedtime 3)  Furosemide 20 Mg Tabs (Furosemide) .[name].. 1 once daily 4)  Lantus Solostar 100 Unit/ml Soln (Insulin glargine) .... 70 units at bedtime 5)  Lovastatin 20 Mg Tabs (Lovastatin) .[name].. 1 once daily 6)  Metformin Hcl 1000 Mg Tabs (Metformin hcl) .[name].. 1 two times a day 7)  Metoprolol Succinate 100 Mg Xr24h-tab (Metoprolol succinate) .... Take one tablet once daily 8)  Sanctura 20 Mg Tabs (Trospium chloride) .... Two times a day 9)  Tylenol Extra Strength 500 Mg Tabs (Acetaminophen) .... As needed 10)  Miralax Powd (Polyethylene glycol 3350) .[name].. 1 capful as needed 11)  Stool Softener 100 Mg Caps (Docusate sodium) .... 2  every morning and 2 at bedtime 12)  Actos 30 Mg Tabs (Pioglitazone hcl) .[name].. 1 by mouth once daily 13)  Levothyroxine Sodium 100 Mcg Tabs (Levothyroxine sodium) .[name].. 1 tablet by mouth q am 14)  Pen Needles 31g X 8 Mm Misc (Insulin pen needle) .... Use as directed 15)  Engage Study Drug  .... Take as directed by research nurse 16)  Diazepam 2 Mg Tabs (Diazepam) .... Take one tablet once daily 17)  Potassium Citrate 10 Meq (1080 Mg) Cr-tabs (Potassium citrate) .... Take 1 by mouth two times a day 18)  Tocradex 0.1-0.3% Sus  .... As needed eye infection   Orders Added: 1)  Est. Patient Level III [04540]

## 2011-01-10 ENCOUNTER — Other Ambulatory Visit: Payer: Self-pay | Admitting: Internal Medicine

## 2011-01-10 ENCOUNTER — Ambulatory Visit (INDEPENDENT_AMBULATORY_CARE_PROVIDER_SITE_OTHER): Payer: Medicare Other | Admitting: Internal Medicine

## 2011-01-10 ENCOUNTER — Encounter: Payer: Self-pay | Admitting: Internal Medicine

## 2011-01-10 DIAGNOSIS — I2789 Other specified pulmonary heart diseases: Secondary | ICD-10-CM

## 2011-01-10 DIAGNOSIS — I4891 Unspecified atrial fibrillation: Secondary | ICD-10-CM

## 2011-01-11 ENCOUNTER — Encounter: Payer: Self-pay | Admitting: Internal Medicine

## 2011-01-11 LAB — CBC WITH DIFFERENTIAL/PLATELET
Basophils Relative: 0.3 % (ref 0.0–3.0)
Eosinophils Relative: 1.5 % (ref 0.0–5.0)
HCT: 42.8 % (ref 39.0–52.0)
Hemoglobin: 14.4 g/dL (ref 13.0–17.0)
Lymphocytes Relative: 18.3 % (ref 12.0–46.0)
Lymphs Abs: 1.4 10*3/uL (ref 0.7–4.0)
Monocytes Relative: 2.7 % — ABNORMAL LOW (ref 3.0–12.0)
Neutro Abs: 5.9 10*3/uL (ref 1.4–7.7)
RBC: 4.57 Mil/uL (ref 4.22–5.81)
RDW: 15.7 % — ABNORMAL HIGH (ref 11.5–14.6)
WBC: 7.7 10*3/uL (ref 4.5–10.5)

## 2011-01-11 LAB — BASIC METABOLIC PANEL
GFR: 41.69 mL/min — ABNORMAL LOW (ref >60.00)
Glucose, Bld: 175 mg/dL — ABNORMAL HIGH (ref 70–99)
Potassium: 4.5 mEq/L (ref 3.5–5.1)
Sodium: 143 mEq/L (ref 135–145)

## 2011-01-12 ENCOUNTER — Ambulatory Visit: Payer: Self-pay | Admitting: Internal Medicine

## 2011-01-12 NOTE — Progress Notes (Signed)
Summary: ONOX on room air- 8 min 26sec desat per night.   Phone Note Other Incoming   Summary of Call: ONOX over 2 nights Jan 21 and jan 22  (total 20 hrs, 27 minutes)   On room air, off BiPAP He desaturated only 56 sec over the 2 nights, = 1.4%. Longest interval < 88% was 43min20 sec.  This would average 8 min 28 sec per night with sat less than or equal to 88% on room air. Techincally he qualifies for oxygen during sleep, but hard to know how much clinical impact there would be. Consider empiric trial. Look for input from cardiology as to whether it would help them to have him sleep on O2.  Initial call taken by: Waymon Budge MD,  January 03, 2011 9:05 PM     Appended Document: ONOX on room air- 8 min 26sec desat per night.  Overnight O2 not critical from a cardiac perspective but I think avoidance of hypoxemia is an important issue so would vote for overnight O2.Thanks.

## 2011-01-12 NOTE — Letter (Signed)
Summary: [facility]   Imported By: Sherian Rein 01/06/2011 15:05:56  _____________________________________________________________________  External Attachment:    Type:   Image     Comment:   External Document

## 2011-01-14 ENCOUNTER — Inpatient Hospital Stay (HOSPITAL_BASED_OUTPATIENT_CLINIC_OR_DEPARTMENT_OTHER)
Admission: RE | Admit: 2011-01-14 | Discharge: 2011-01-14 | Disposition: A | Discharge status: Home / Self Care | Payer: Medicare Other | Source: Ambulatory Visit | Attending: Internal Medicine | Admitting: Internal Medicine

## 2011-01-14 DIAGNOSIS — E785 Hyperlipidemia, unspecified: Secondary | ICD-10-CM | POA: Insufficient documentation

## 2011-01-14 DIAGNOSIS — R9389 Abnormal findings on diagnostic imaging of other specified body structures: Secondary | ICD-10-CM

## 2011-01-14 DIAGNOSIS — E669 Obesity, unspecified: Secondary | ICD-10-CM | POA: Insufficient documentation

## 2011-01-14 DIAGNOSIS — G4733 Obstructive sleep apnea (adult) (pediatric): Secondary | ICD-10-CM | POA: Insufficient documentation

## 2011-01-14 DIAGNOSIS — R002 Palpitations: Secondary | ICD-10-CM | POA: Insufficient documentation

## 2011-01-14 DIAGNOSIS — E119 Type 2 diabetes mellitus without complications: Secondary | ICD-10-CM | POA: Insufficient documentation

## 2011-01-14 DIAGNOSIS — I2789 Other specified pulmonary heart diseases: Secondary | ICD-10-CM | POA: Insufficient documentation

## 2011-01-14 DIAGNOSIS — I4891 Unspecified atrial fibrillation: Secondary | ICD-10-CM | POA: Insufficient documentation

## 2011-01-14 LAB — POCT I-STAT 3, ART BLOOD GAS (G3+)
Bicarbonate: 28.5 mEq/L — ABNORMAL HIGH (ref 20.0–24.0)
O2 Saturation: 91 %
TCO2: 30 mmol/L (ref 0–100)
pCO2 arterial: 47.7 mmHg — ABNORMAL HIGH (ref 35.0–45.0)
pH, Arterial: 7.373 (ref 7.350–7.450)

## 2011-01-14 LAB — POCT I-STAT 3, VENOUS BLOOD GAS (G3P V)
Acid-Base Excess: 3 mmol/L — ABNORMAL HIGH (ref 0.0–2.0)
Bicarbonate: 26.5 mEq/L — ABNORMAL HIGH (ref 20.0–24.0)
Bicarbonate: 29.3 mEq/L — ABNORMAL HIGH (ref 20.0–24.0)
O2 Saturation: 63 %
pCO2, Ven: 51.3 mmHg — ABNORMAL HIGH (ref 45.0–50.0)
pH, Ven: 7.339 — ABNORMAL HIGH (ref 7.250–7.300)
pO2, Ven: 33 mmHg (ref 30.0–45.0)
pO2, Ven: 34 mmHg (ref 30.0–45.0)

## 2011-01-20 NOTE — Assessment & Plan Note (Signed)
Summary: rov/hms      Allergies Added:   Referring Provider:  N/A Primary Provider:  Oliver Barre MD  CC:  no cardiac complaints.  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   Since we last saw him he had an echo 10/11: EF 60-65% with mild LVH.  Right ventricle: The cavity size was moderately dilated. Systolic function was mildly reduced. Right atrium: The atrium was severely dilated. Pulmonary arteries: PA systolic pressure 55-59 mmHg.   Also had overnight pulse ox which demonstrated nocturnal desats and he has been set up with O2 at night. Still cannot wear CPAP.  Overall doing ok. 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 Mild chronic edema. No CP. Had one episode of severe palpitations which lasted a few minutes and resolved. No syncope.   Current Medications (verified): 1)  Digoxin 0.25 Mg  Tabs (Digoxin) .[name].. 1 Once Daily 2)  Tamsulosin Hcl 0.4 Mg Caps (Tamsulosin Hcl) .[name].. 1 By Mouth At Bedtime 3)  Furosemide 20 Mg  Tabs (Furosemide) .[name].. 1 Once Daily 4)  Lantus Solostar 100 Unit/ml  Soln (Insulin Glargine) .... 70 Units At Bedtime 5)  Lovastatin 20 Mg  Tabs (Lovastatin) .[name].. 1 Once Daily 6)  Metformin Hcl 1000 Mg  Tabs (Metformin Hcl) .[name].. 1 Two Times A Day 7)  Metoprolol Succinate 100 Mg Xr24h-Tab (Metoprolol Succinate) .... Take One Tablet Once Daily 8)  Sanctura 20 Mg  Tabs (Trospium Chloride) .... Two Times A Day 9)  Tylenol Extra Strength 500 Mg  Tabs (Acetaminophen) .... As Needed 10)  Miralax   Powd (Polyethylene Glycol 3350) .[name].. 1 Capful As Needed 11)  Stool Softener 100 Mg Caps (Docusate Sodium) .... 2  Every Morning and 2 At  Bedtime 12)  Actos 30 Mg Tabs (Pioglitazone Hcl) .[name].. 1 By Mouth Once Daily 13)  Levothyroxine Sodium 100 Mcg Tabs (Levothyroxine Sodium) .[name].. 1 Tablet By Mouth Q Am 14)  Pen Needles 31g X 8 Mm Misc (Insulin Pen Needle) .... Use As Directed 15)  Engage Study Drug .... Take As Directed By Research Nurse 16)  Diazepam 2 Mg Tabs (Diazepam) .... Take One Tablet Once Daily 17)  Potassium Citrate 10 Meq (1080 Mg) Cr-Tabs (Potassium Citrate) .... Take 1 By Mouth Two Times A Day  Allergies (verified): 1)  Penicillin G Potassium (Penicillin G Potassium)  Past History:  Past Medical History: Reviewed history from 12/22/2010 and no changes required. FAMILY HX COLON CANCER (ICD-V16.0) CONSTIPATION (ICD-564.00) TWITCHING (ICD-781.0) VITAMIN B12 DEFICIENCY (ICD-266.2) DIAB W/NEURO MANIFESTS TYPE II/UNS NOT UNCNTRL (ICD-250.60) CLAUDICATION (ICD-443.9) DYSEQUILIBRIUM (ICD-780.4) BENIGN PROSTATIC HYPERTROPHY, WITH URINARY OBSTRUCTION (ICD-600.01) SLEEP APNEA (ICD-780.57)- NPSG 05/14/07- AHI 80.7/hr OBESITY (ICD-278.00) HYPERLIPIDEMIA, MIXED (ICD-272.2) ATRIAL FIBRILLATION (ICD-427.31) Anticoagulation Chest pain   --Myoview 8/07: Not gated. No ischemia or infarct. Diastolic HF    --Echo 3/09: EF 60% mild AI/MR. RV mild to moderately dilated/dysfunction. ?restrictive CM. RVSP 36 DIABETES MELLITUS, TYPE I, UNCONTROLLED (ICD-250.03) Hx bladder polyps  Review of Systems       As per HPI and past medical history; otherwise all systems negative.   Vital Signs:  Patient profile:   77 year old male Height:      72 inches Weight:  250 pounds Pulse rate:   88 / minute BP sitting:   130 / 70  (right arm)  Vitals Entered By: [name] [name], CMA (January 10, 2011 2:13 PM)  Physical Exam  General:  Somewhat chronically ill appearing. Walks slowly with cane. O2 sats stay at 95% with walking. HEENT: normal except for perio-orbital cyanosis and telengectasias Neck: supple. + JVD. Carotids to  jaw 2+ bilat; no bruits. No lymphadenopathy or thryomegaly appreciated. Cor: PMI nonpalpale. Irregular rate. P2 not overly prominent. I do not appreciate an RV lift Lungs: clear Abdomen: obese soft, nontender, nondistended. Good bowel sounds. Extremities: no cyanosis, clubbing, extensive stasis dermatitis with 1+ chronic edema from calves down bilaterally Neuro: alert & orientedx3, cranial nerves grossly intact. moves all 4 extremities w/o difficulty. affect pleasant    Impression & Recommendations:  Problem # 1:  PULMONARY HYPERTENSION (ICD-416.8) With mild cor pulmonale on echo. Suspect this is likely multifactorial due to untreated OSA, obesity and diastolic dysfunction.  Discussed risks and indications of cath to further work-up PAH and he is willing to proceed. Will plan R and L heart cath on Friday. Stop anti-coag today.   Problem # 2:  ATRIAL FIBRILLATION (ICD-427.31) Chronic. Remains in Engage trial. Have discussed with study nurses. Will stop anti-coag today and resume post cath.   Problem # 3:  Palpitations Transient. If recurs will need 2 week event monitor.   Other Orders: Cardiac Catheterization (Cardiac Cath) TLB-BMP (Basic Metabolic Panel-BMET) (80048-METABOL) TLB-CBC Platelet - w/Differential (85025-CBCD) TLB-PT (Protime) (85610-PTP)  Patient Instructions: 1)  Labs today 2)  Your physician recommends that you return for lab work on: Thur 2/9 (pt 416.8) 3)  Your physician wants you to follow-up in:  3 months.  You will receive a reminder letter in the mail two months in advance. If you don't receive a letter, please call our office to schedule the follow-up appointment.

## 2011-01-20 NOTE — Letter (Signed)
Summary: Cardiac Catheterization Instructions- [facility]  [facility], Main Office  [address]   [place], [place] [address]   Phone: [phone]  Fax: [phone]     01/10/2011 MRN: [id number]  [facility] [address] [place], [place]  [address]  Dear Mr. Greenly,   You are scheduled for a Cardiac Catheterization on Friday 01/14/11 with Dr. Gala Romney  Please arrive to the 1st floor of the [facility] and V[facility] Center at [facility] at 10:30 am / pm on the day of your procedure. Please do not arrive before 6:30 a.m. Call the Heart and Vascular Center at [phone] if you are unable to make your appointmnet. The Code to get into the parking garage under the building is 0300. Take the elevators to the 1st floor. You must have someone to drive you home. Someone must be with you for the first 24 hours after you arrive home. Please wear clothes that are easy to get on and off and wear slip-on shoes. Do not eat or drink after midnight except water with your medications that morning. Bring all your medications and current insurance cards with you.  __X_ DO NOT take these medications before your procedure:   On Thur. PM only take 35 units of Lantus   Do Not take Furosemide, Metformin, or Actose AM of Test   Do Not take Metformin for 2 days after test, restart on Monday   ___ Make sure you take your aspirin.  ___ You may take ALL of your medications with water that morning. ________________________________________________________________________________________________________________________________  ___ DO NOT take ANY medications before your procedure.  ___ Pre-med instructions:  ________________________________________________________________________________________________________________________________  The usual length of stay after your procedure is 2 to 3 hours. This can vary.  If you have any questions, please call the office at the number  listed above.   Meredith Staggers, RN

## 2011-01-20 NOTE — Procedures (Signed)
Summary: [facility]  [facility]   Imported By: Lester Cimarron 01/11/2011 09:36:09  _____________________________________________________________________  External Attachment:    Type:   Image     Comment:   External Document

## 2011-01-26 NOTE — Procedures (Signed)
  [name] [name], [name] [name]               ACCOUNT NO.:  [id number]  MEDICAL RECORD NO.:  [id number]          PATIENT TYPE:  OUT  LOCATION:  [facility]                         FACILITY:  [facility]  PHYSICIAN:  [name], MDDATE OF BIRTH:  [birth year]  DATE OF PROCEDURE:  01/14/2011 DATE OF DISCHARGE:  09/07/2010                           CARDIAC CATHETERIZATION   PRIMARY CARE PHYSICIAN:  Rosalyn Gess. Norins, MD  Mr. Newby is a very pleasant 77 year old male with a history of diabetes, hypertension, chronic atrial fibrillation, sleep apnea and dyspnea.  Echocardiogram showed possible restrictive cardiomyopathy with RV dysfunction and elevated pulmonary pressures.  He was brought today for right and left heart catheterization to further evaluate.  PROCEDURES PERFORMED: 1. Right heart cath. 2. Selective coronary angiography. 3. Left heart cath.  DESCRIPTION OF PROCEDURE:  The risks and indication are explained. Consent was signed and placed on the chart.  A 7-French venous sheath was placed in the right femoral vein.  A standard Swan-Ganz catheter was used for the right heart catheterization.  A 4-French arterial sheath was placed in the right femoral artery and standard JL-4, 3-D RCA and angled pigtail catheters were used.  All catheter exchanges made over wire.  No apparent complications.  HEMODYNAMICS:  Right atrial pressure mean of 11, RV pressure 42/12 with EDP of 10, PA pressure 45/19 with a mean of 28.  Pulmonary capillary wedge pressure mean of 16.  Central aortic pressure 121/60 with a mean of 92.  LV pressure 119/19 with an LVEDP of 19.  Femoral artery saturation was 91%.  Pulmonary artery saturation was 59% and 63%.  Fick cardiac output was 5.5.  Cardiac index 2.4.  Pulmonary vascular resistance was 2.2 Woods units.  On simultaneous RV and LV tracings, there was no evidence of ventricular intradependence.  Left main was normal.  LAD was a long vessel gave off 2 diagonals  was angiographically normal.  Left circumflex had a tiny AV groove circumflex vessel.  The vessels made primarily with a large OM1 which was angiographically normal.  Right coronary artery was large dominant vessel gave off PDA and 2 posterolaterals is angiographically normal.  The left ventriculogram was not done in order to limit contrast exposure.  ASSESSMENT: 1. Normal coronary arteries. 2. Mild pulmonary hypertension.  PLAN:  As discussion, I suspect the majority of his dyspnea is due to his lung disease and related RV dysfunction.  We will continue with medical therapy.  At this time, he does not warrant therapy with pulmonary vasodilators.     [name], MD     DRB/MEDQ  D:  01/14/2011  T:  01/15/2011  Job:  [id number]  cc:   Rosalyn Gess. Norins, MD  Electronically Signed by Arvilla Meres MD on 01/26/2011 01:51:18 PM

## 2011-01-28 ENCOUNTER — Encounter: Payer: Self-pay | Admitting: Physician Assistant

## 2011-01-28 ENCOUNTER — Encounter (INDEPENDENT_AMBULATORY_CARE_PROVIDER_SITE_OTHER): Payer: Medicare Other | Admitting: Physician Assistant

## 2011-01-28 DIAGNOSIS — I4891 Unspecified atrial fibrillation: Secondary | ICD-10-CM

## 2011-01-28 DIAGNOSIS — R0602 Shortness of breath: Secondary | ICD-10-CM

## 2011-02-01 NOTE — Assessment & Plan Note (Addendum)
Summary: eph/kim jv lab/mt  Medications Added * ENGAGE STUDY DRUG       Allergies Added: ! PENICILLIN  Visit Type:  eph Referring Provider:  N/A Primary Provider:  Oliver Barre MD  CC:  edema in feet. sob. pt has no other complaints today.[name]  History of Present Illness: Primary Cardiologist:  Dr. 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  Last echo done in 10/11: EF 60-65% with mild LVH.  Right ventricle: The cavity size was moderately dilated. Systolic function was mildly reduced. Right atrium: The atrium was severely dilated. Pulmonary arteries: PA systolic pressure 55-59 mmHg.  He did have overnight pulse ox which demonstrated nocturnal desats and he has been set up with O2 at night.  He still cannot wear CPAP.  He was set up for R + L heart cath to further evaluate his pulmonary HTN.  This was done 01/14/11: normal coronary arteries and right sided pressures consistent with just mild pulmonary HTN.  Dr. Gala Romney felt that the majority of his dyspnea is related to his lung disease and its related RV dysfunction.  He returns for post cath follow up.  He denies chest pain.  His dyspnea with exertion is unchanged.  His LE edema is unchanged.  He has a sore on his right leg that is being taken care of by his podiatrist.  He sleeps at an incline.  He is still intolerant to CPAP.  Current Medications (verified): 1)  Digoxin 0.25 Mg  Tabs (Digoxin) .[name].. 1 Once Daily 2)  Tamsulosin Hcl 0.4 Mg Caps (Tamsulosin Hcl) .[name].. 1 By Mouth At Bedtime 3)  Furosemide 20 Mg  Tabs (Furosemide) .[name].. 1 Once Daily 4)  Lantus Solostar 100 Unit/ml  Soln (Insulin Glargine) .... 70 Units At Bedtime 5)  Lovastatin 20 Mg  Tabs (Lovastatin) .[name].. 1 Once  Daily 6)  Metformin Hcl 1000 Mg  Tabs (Metformin Hcl) .[name].. 1 Two Times A Day 7)  Metoprolol Succinate 100 Mg Xr24h-Tab (Metoprolol Succinate) .... Take One Tablet Once Daily 8)  Sanctura 20 Mg  Tabs (Trospium Chloride) .... Two Times A Day 9)  Tylenol Extra Strength 500 Mg  Tabs (Acetaminophen) .... As Needed 10)  Miralax   Powd (Polyethylene Glycol 3350) .[name].. 1 Capful As Needed 11)  Stool Softener 100 Mg Caps (Docusate Sodium) .... 2  Every Morning and 2 At Bedtime 12)  Actos 30 Mg Tabs (Pioglitazone Hcl) .[name].. 1 By Mouth Once Daily 13)  Levothyroxine Sodium 100 Mcg Tabs (Levothyroxine Sodium) .[name].. 1 Tablet By Mouth Q Am 14)  Pen Needles 31g X 8 Mm Misc (Insulin Pen Needle) .... Use As Directed 15)  Engage Study Drug .... Take As Directed By Research Nurse 16)  Diazepam 2 Mg Tabs (Diazepam) .... Take One Tablet Once Daily 17)  Potassium Citrate 10 Meq (1080 Mg) Cr-Tabs (Potassium Citrate) .... Take 1 By Mouth Two Times A Day 18)  Engage Study Drug  Allergies (verified): 1)  ! Penicillin  Past History:  Past Medical History: Last updated: 12/22/2010 FAMILY HX COLON CANCER (ICD-V16.0) CONSTIPATION (ICD-564.00) TWITCHING (ICD-781.0) VITAMIN B12 DEFICIENCY (ICD-266.2) DIAB W/NEURO MANIFESTS TYPE II/UNS NOT UNCNTRL (ICD-250.60) CLAUDICATION (ICD-443.9) DYSEQUILIBRIUM (ICD-780.4) BENIGN PROSTATIC HYPERTROPHY, WITH URINARY OBSTRUCTION (ICD-600.01) SLEEP APNEA (ICD-780.57)- NPSG 05/14/07- AHI 80.7/hr OBESITY (ICD-278.00) HYPERLIPIDEMIA, MIXED (ICD-272.2) ATRIAL  FIBRILLATION (ICD-427.31) Anticoagulation Chest pain   --Myoview 8/07: Not gated. No ischemia or infarct. Diastolic HF    --Echo 3/09: EF 60% mild AI/MR. RV mild to moderately dilated/dysfunction. ?restrictive CM. RVSP 36 DIABETES MELLITUS, TYPE I, UNCONTROLLED (ICD-250.03) Hx bladder polyps  Vital Signs:  Patient profile:   77 year old male Height:      72 inches Weight:      253.25 pounds BMI:     34.47 Pulse rate:   61  / minute Resp:     18 per minute BP sitting:   116 / 70  (left arm) Cuff size:   large  Vitals Entered By: Celestia Khat, CMA (January 28, 2011 9:58 AM)  Physical Exam  General:  Well nourished, well developed, in no acute distress HEENT: normal Neck: no JVD Cardiac:  normal S1, S2; irreg irreg Lungs:  clear to auscultation bilaterally, no wheezing, rhonchi or rales Abd: soft, nontender, no hepatomegaly Ext: no edema; RFA site without hematoma or bruit Skin: warm and dry Neuro:  CNs 2-12 intact, no focal abnormalities noted    EKG  Procedure date:  01/28/2011  Findings:      atrial fibrillation Heart rate 61 Normal axis Right bundle-branch block No significant change since previous tracing  Impression & Recommendations:  Problem # 1:  PULMONARY HYPERTENSION (ICD-416.8) As noted this is mainly related to his lung disease and associated RV dysfunction.  Continue medical therapy.  I will discuss with Dr. Gala Romney whether or not he should pursue pulmonary rehabilitation.  Problem # 2:  ATRIAL FIBRILLATION (ICD-427.31) Rate controlled.  He is on study drug through the engage trial.  Problem # 3:  SLEEP APNEA (ICD-780.57) Followup of sleep medicine.  Other Orders: EKG w/ Interpretation (93000)  Patient Instructions: 1)  Your physician recommends that you schedule a follow-up appointment in: 1-2 month follow up with Dr. Gala Romney as per Tereso Newcomer, PA-C. 2)  Your physician recommends that you continue on your current medications as directed. Please refer to the Current Medication list given to you today.

## 2011-02-02 ENCOUNTER — Encounter: Payer: Self-pay | Admitting: Internal Medicine

## 2011-02-02 ENCOUNTER — Ambulatory Visit (INDEPENDENT_AMBULATORY_CARE_PROVIDER_SITE_OTHER): Payer: Medicare Other | Admitting: Internal Medicine

## 2011-02-02 DIAGNOSIS — G4730 Sleep apnea, unspecified: Secondary | ICD-10-CM

## 2011-02-02 DIAGNOSIS — I2789 Other specified pulmonary heart diseases: Secondary | ICD-10-CM

## 2011-02-03 ENCOUNTER — Encounter: Payer: Self-pay | Admitting: Physician Assistant

## 2011-02-03 ENCOUNTER — Telehealth: Payer: Self-pay | Admitting: Internal Medicine

## 2011-02-04 ENCOUNTER — Telehealth (INDEPENDENT_AMBULATORY_CARE_PROVIDER_SITE_OTHER): Payer: Self-pay | Admitting: *Deleted

## 2011-02-08 ENCOUNTER — Encounter (INDEPENDENT_AMBULATORY_CARE_PROVIDER_SITE_OTHER): Payer: Medicare Other

## 2011-02-08 DIAGNOSIS — R0989 Other specified symptoms and signs involving the circulatory and respiratory systems: Secondary | ICD-10-CM

## 2011-02-10 ENCOUNTER — Encounter (INDEPENDENT_AMBULATORY_CARE_PROVIDER_SITE_OTHER): Payer: Self-pay | Admitting: *Deleted

## 2011-02-10 NOTE — Progress Notes (Signed)
Summary: [facility] reports pt is on autoBiPAP 4-20  Phone Note Other Incoming   Summary of Call: [facility] reports patient on BiPAP - Auto range 4-20 Initial call taken by: Waymon Budge MD,  February 03, 2011 8:04 PM    New/Updated Medications: * AUTO BIPAP 4-20 ADVANCED

## 2011-02-10 NOTE — Progress Notes (Signed)
----   Converted from flag ---- ---- 02/02/2011 8:28 AM, Tereso Newcomer PA-C wrote: Okey Regal Please let patient know I spoke to Dr. Gala Romney and he wants the patient to go to pulmonary rehab.  Please make referral.  Please convert this to a phone note when done so it stays in chart. Scott   ---- 01/31/2011 9:46 AM, Dolores Patty, MD, [facility] wrote: yes please. thanks - dan  ---- 01/28/2011 11:05 AM, Tereso Newcomer PA-C wrote: Jesusita Oka, do you want him to go to pulmonary rehabilitation? ------------------------------

## 2011-02-10 NOTE — Assessment & Plan Note (Signed)
Summary: ROV   Copy to:  N/A Primary Provider/Referring Provider:  Oliver Barre MD  CC:  6 wk follow up.  Pt states he started cpap approx 1 wk ago.  Wearing in everynight x 5-6 hours.  New mask x 2 days ago -- working good.  No problms with pressure.[name]  History of Present Illness: December 22, 2010 OSA, AFIB,  PHTN, Diast Dysfunction Nurse-CC: Sleep consult-Dr. Laney Pastor establish from 2008. Incidental- Reports epistaxis x 3 days, L nostril  finally ended last night. He is on a study drug anticoagulant through [facility] card. No other bleed.  OSA- originally dx'd NPSG 05/14/07- severe OSA, AHI 80.7/hr. Gave up on CPAP then autoBIPAP after only a few months in 2008, finding it bothersome and not helpful. Wife tells him he holds his breath a lot. He has always snored. See comments about sleep and AutoBiPAP reviewed from 2008 note. Stil can't sleep lying down- prefers recliner. Nocturia x 3-5 or even 6, and hip pain disturb sleep.  Bedtime 9-10 PM, latency few minutes, up 830AM. When awake breathing seems ok.   February 02, 2011-  OSA/ intol CPAP AFIB,  PHTN, Diast Dysfunction Nurse-CC: 6 wk follow up.  Pt states he started cpap approx 1 wk ago.  Wearing in everynight x 5-6 hours.  New mask x 2 days ago -- working good.  No problms with pressure. ONOX room air 01/03/11- 16 minutes< 88%. Cardiology note reviewed- no CAD, mild PHTN.  He had first good night w/ CPAP last night- saying wife needs wake up  to reattach mask for him several times each night when he is up to bathroom. Strap snaps easily wen he can see it. Isuggested leaving mask on and disconecting the hose from the mask when he needs to get up.     Preventive Screening-Counseling & Management  Alcohol-Tobacco     Smoking Status: quit     Packs/Day: 1.0     Year Quit: 1987     Pack years: 40 years   Current Medications (verified): 1)  Digoxin 0.25 Mg  Tabs (Digoxin) .[name].. 1 Once Daily 2)  Tamsulosin Hcl 0.4 Mg Caps (Tamsulosin Hcl)  .[name].. 1 By Mouth At Bedtime 3)  Furosemide 20 Mg  Tabs (Furosemide) .[name].. 1 Once Daily 4)  Lantus Solostar 100 Unit/ml  Soln (Insulin Glargine) .... 70 Units At Bedtime 5)  Lovastatin 20 Mg  Tabs (Lovastatin) .[name].. 1 Once Daily 6)  Metformin Hcl 1000 Mg  Tabs (Metformin Hcl) .[name].. 1 Two Times A Day 7)  Metoprolol Succinate 100 Mg Xr24h-Tab (Metoprolol Succinate) .... Take One Tablet Once Daily 8)  Sanctura 20 Mg  Tabs (Trospium Chloride) .... Two Times A Day 9)  Tylenol Extra Strength 500 Mg  Tabs (Acetaminophen) .... As Needed 10)  Miralax   Powd (Polyethylene Glycol 3350) .[name].. 1 Capful As Needed 11)  Stool Softener 100 Mg Caps (Docusate Sodium) .... 2  Every Morning and 2 At Bedtime 12)  Actos 30 Mg Tabs (Pioglitazone Hcl) .[name].. 1 By Mouth Once Daily 13)  Levothyroxine Sodium 100 Mcg Tabs (Levothyroxine Sodium) .[name].. 1 Tablet By Mouth Q Am 14)  Pen Needles 31g X 8 Mm Misc (Insulin Pen Needle) .... Use As Directed 15)  Engage Study Drug .... Take As Directed By Research Nurse 16)  Diazepam 2 Mg Tabs (Diazepam) .[name].. 1-3 As Needed 17)  Potassium Citrate 10 Meq (1080 Mg) Cr-Tabs (Potassium Citrate) .... Take 1 By Mouth Two Times A Day 18)  Engage Study Drug  Allergies (verified): 1)  ! Penicillin  Past History:  Past Medical History: Last updated: 01/17/2011 FAMILY HX COLON CANCER (ICD-V16.0) CONSTIPATION (ICD-564.00) TWITCHING (ICD-781.0) VITAMIN B12 DEFICIENCY (ICD-266.2) DIAB W/NEURO MANIFESTS TYPE II/UNS NOT UNCNTRL (ICD-250.60) CLAUDICATION (ICD-443.9) DYSEQUILIBRIUM (ICD-780.4) BENIGN PROSTATIC HYPERTROPHY, WITH URINARY OBSTRUCTION (ICD-600.01) SLEEP APNEA (ICD-780.57)- NPSG 05/14/07- AHI 80.7/hr OBESITY (ICD-278.00) HYPERLIPIDEMIA, MIXED (ICD-272.2) ATRIAL FIBRILLATION (ICD-427.31) Anticoagulation Chest pain   --Myoview 8/07: Not gated. No ischemia or infarct. Diastolic HF    --Echo 3/09: EF 60% mild AI/MR. RV mild to moderately dilated/dysfunction. ?restrictive CM. RVSP  36 DIABETES MELLITUS, TYPE I, UNCONTROLLED (ICD-250.03) Hx bladder polyps  Past Surgical History: Last updated: January 17, 2011 Appendectomy Supraclavicular benign tumor excision Left elbow fx requiring multiple sx for ORIF Kidney stone retrieval with ureteral stent x2 TURBT  Bladder polyps x 3- Dr Isabel Caprice  Family History: Last updated: Jan 17, 2011 father- died 30; CVA, hip fx, pneumonia mother- died 11; lung cancer, nonsmoker sister - colon cancer  uncle-prostate cancer Neg - DM  Social History: Last updated: 04/20/2009 married '97 1 son- '64; 3 daughters - '54, '60, '62 3 granddaughters 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 SO in fair [facility] Tobacco Use - Former.  Alcohol Use - no Drug Use - no  Risk Factors: Smoking Status: quit (02/02/2011) Packs/Day: 1.0 (02/02/2011)  Review of Systems      See HPI       The patient complains of shortness of breath with activity.  The patient denies shortness of breath at rest, productive cough, non-productive cough, coughing up blood, chest pain, irregular heartbeats, acid heartburn, indigestion, loss of appetite, weight change, abdominal pain, difficulty swallowing, sore throat, tooth/dental problems, headaches, nasal congestion/difficulty breathing through nose, and sneezing.    Vital Signs:  Patient profile:   77 year old male Height:      72 inches Weight:      259 pounds BMI:     35.25 O2 Sat:      95 % on Room air Pulse rate:   94 / minute BP sitting:   118 / 64  (left arm) Cuff size:   large  Vitals Entered By: Gweneth Dimitri RN (February 02, 2011 10:34 AM)  O2 Flow:  Room air CC: 6 wk follow up.  Pt states he started cpap approx 1 wk ago.  Wearing in everynight x 5-6 hours.  New mask x 2 days ago -- working good.  No problms with pressure. Comments Medications reviewed with patient Daytime contact number verified with patient. Gweneth Dimitri RN  February 02, 2011  10:34 AM    Physical Exam  Additional Exam:  General: A/Ox3; pleasant and cooperative, NAD, tired appearing, overweight SKIN: no rash, lesions, lips dark- ??dusky NODES: no lymphadenopathy HEENT: Grant/AT, EOM- WNL, Conjuctivae- clear, PERRLA, TM-WNL, Nose-minimal clot left nostril, Throat- clear and wnl, Mallampati  III NECK: Supple w/ fair ROM, JVD- 1 cm, normal carotid impulses w/o bruits Thyroid- normal to palpation CHEST: Clear to P&A HEART: irregularly irregular, no m/g/r heard ABDOMEN: Soft and nl; nml bowel sounds; no organomegaly or masses noted QMV:HQIO, nl pulses, 1+ edema, no cyanosis in fingers, no clubbing.  NEURO: Grossly intact to observation      Impression & Recommendations:  Problem # 1:  PULMONARY HYPERTENSION (ICD-416.8)  Mild per recent cath. CPAP should be sufficient to help from maintenance of oxygen saturation.  Problem # 2:  SLEEP APNEA (ICD-780.57)  Obstructive sleep apnea. He is intelligent and motivated, but  finds it frustrating dealing with CPAP since he has frequent nocturia. I will ask [facility] to work with him on facility with mask disconnect.   Medications Added to Medication List This Visit: 1)  Diazepam 2 Mg Tabs (Diazepam) .[name].. 1-3 as needed  Other Orders: Est. Patient Level III (91478) DME Referral (DME)  Patient Instructions: 1)  Please schedule a follow-up appointment in 2 months. 2)  I am glad you had a better night with CPAP last night. We will let [facility] help you find good ways to deal with the strap conection.  3)  It may work to [employer] the mask from the hose at night, so you can get up without taking off the mask.

## 2011-02-15 NOTE — Letter (Signed)
Summary: Pulmonary Rehab Order Form  Pulmonary Rehab Order Form   Imported By: Marylou Mccoy 02/11/2011 10:20:51  _____________________________________________________________________  External Attachment:    Type:   Image     Comment:   External Document

## 2011-02-15 NOTE — Letter (Signed)
Summary: Appointment - Reschedule  [facility] [employer], Main Office  [address]   [place], [place] [address]   Phone: [phone]  Fax: [phone]     February 10, 2011 MRN: [id number]   [facility] [address] [place], [place]  [address]   Dear Mr. Vines,   Due to a change in our office schedule, your appointment on March 29,2012 at 10:30 must be changed.  It is very important that we reach you to reschedule this appointment. We look forward to participating in your health care needs. Please contact us at the number listed above at your earliest convenience to reschedule this appointment.     Sincerely, michell,[employer] Glass blower/designer

## 2011-02-19 ENCOUNTER — Encounter: Payer: Self-pay | Admitting: Cardiovascular Disease

## 2011-03-02 ENCOUNTER — Other Ambulatory Visit: Payer: Self-pay | Admitting: Internal Medicine

## 2011-03-03 ENCOUNTER — Ambulatory Visit: Payer: Medicare Other | Admitting: Internal Medicine

## 2011-03-08 ENCOUNTER — Encounter (INDEPENDENT_AMBULATORY_CARE_PROVIDER_SITE_OTHER): Payer: Medicare Other

## 2011-03-08 DIAGNOSIS — R0989 Other specified symptoms and signs involving the circulatory and respiratory systems: Secondary | ICD-10-CM

## 2011-03-14 LAB — COMPREHENSIVE METABOLIC PANEL
BUN: 32 mg/dL — ABNORMAL HIGH (ref 6–23)
CO2: 33 mEq/L — ABNORMAL HIGH (ref 19–32)
Calcium: 9.5 mg/dL (ref 8.4–10.5)
Chloride: 108 mEq/L (ref 96–112)
Creatinine, Ser: 1.54 mg/dL — ABNORMAL HIGH (ref 0.4–1.5)
GFR calc Af Amer: 54 mL/min — ABNORMAL LOW (ref >60)
GFR calc non Af Amer: 44 mL/min — ABNORMAL LOW (ref >60)
Glucose, Bld: 117 mg/dL — ABNORMAL HIGH (ref 70–99)
Total Bilirubin: 1.2 mg/dL (ref 0.3–1.2)

## 2011-03-14 LAB — PROTIME-INR
INR: 1.7 — ABNORMAL HIGH (ref 0.00–1.49)
Prothrombin Time: 21 seconds — ABNORMAL HIGH (ref 11.6–15.2)

## 2011-03-14 LAB — CBC
HCT: 40.3 % (ref 39.0–52.0)
Hemoglobin: 13.5 g/dL (ref 13.0–17.0)
MCHC: 33.5 g/dL (ref 30.0–36.0)
MCHC: 33.5 g/dL (ref 30.0–36.0)
MCHC: 33.6 g/dL (ref 30.0–36.0)
MCV: 94.4 fL (ref 78.0–100.0)
RBC: 4.05 MIL/uL — ABNORMAL LOW (ref 4.22–5.81)
RBC: 4.26 MIL/uL (ref 4.22–5.81)
RDW: 16.4 % — ABNORMAL HIGH (ref 11.5–15.5)
WBC: 6.5 10*3/uL (ref 4.0–10.5)

## 2011-03-14 LAB — DIFFERENTIAL
Basophils Absolute: 0 10*3/uL (ref 0.0–0.1)
Lymphocytes Relative: 16 % (ref 12–46)
Lymphs Abs: 1 10*3/uL (ref 0.7–4.0)
Neutro Abs: 4.2 10*3/uL (ref 1.7–7.7)
Neutrophils Relative %: 70 % (ref 43–77)

## 2011-03-14 LAB — TSH: TSH: 3.566 u[IU]/mL (ref 0.350–4.500)

## 2011-03-14 LAB — GLUCOSE, CAPILLARY
Glucose-Capillary: 110 mg/dL — ABNORMAL HIGH (ref 70–99)
Glucose-Capillary: 120 mg/dL — ABNORMAL HIGH (ref 70–99)
Glucose-Capillary: 186 mg/dL — ABNORMAL HIGH (ref 70–99)

## 2011-03-14 LAB — BASIC METABOLIC PANEL
CO2: 29 mEq/L (ref 19–32)
Calcium: 9.1 mg/dL (ref 8.4–10.5)
Calcium: 9.2 mg/dL (ref 8.4–10.5)
Creatinine, Ser: 1.31 mg/dL (ref 0.4–1.5)
Creatinine, Ser: 1.33 mg/dL (ref 0.4–1.5)
GFR calc Af Amer: >60 mL/min (ref >60)
GFR calc Af Amer: >60 mL/min (ref >60)
Glucose, Bld: 123 mg/dL — ABNORMAL HIGH (ref 70–99)

## 2011-03-14 LAB — CULTURE, BLOOD (ROUTINE X 2): Culture: NO GROWTH

## 2011-03-14 LAB — HEMOGLOBIN A1C
Hgb A1c MFr Bld: 7.1 % — ABNORMAL HIGH (ref 4.6–6.1)
Mean Plasma Glucose: 148 mg/dL
Mean Plasma Glucose: 157 mg/dL

## 2011-03-14 LAB — BRAIN NATRIURETIC PEPTIDE: Pro B Natriuretic peptide (BNP): 90.3 pg/mL (ref 0.0–100.0)

## 2011-03-14 LAB — DIGOXIN LEVEL: Digoxin Level: 0.5 ng/mL — ABNORMAL LOW (ref 0.8–2.0)

## 2011-03-14 LAB — URINALYSIS, ROUTINE W REFLEX MICROSCOPIC
Glucose, UA: NEGATIVE mg/dL
Ketones, ur: NEGATIVE mg/dL
Nitrite: NEGATIVE
pH: 6.5 (ref 5.0–8.0)

## 2011-03-14 LAB — APTT: aPTT: 37 seconds (ref 24–37)

## 2011-03-17 LAB — GLUCOSE, CAPILLARY: Glucose-Capillary: 126 mg/dL — ABNORMAL HIGH (ref 70–99)

## 2011-03-29 ENCOUNTER — Encounter (HOSPITAL_COMMUNITY)
Admission: RE | Admit: 2011-03-29 | Discharge: 2011-03-29 | Disposition: A | Discharge status: Home / Self Care | Payer: Medicare Other | Source: Ambulatory Visit | Attending: Internal Medicine | Admitting: Internal Medicine

## 2011-03-29 DIAGNOSIS — Z5189 Encounter for other specified aftercare: Secondary | ICD-10-CM | POA: Insufficient documentation

## 2011-03-29 DIAGNOSIS — G4733 Obstructive sleep apnea (adult) (pediatric): Secondary | ICD-10-CM | POA: Insufficient documentation

## 2011-03-29 DIAGNOSIS — E785 Hyperlipidemia, unspecified: Secondary | ICD-10-CM | POA: Insufficient documentation

## 2011-03-29 DIAGNOSIS — E119 Type 2 diabetes mellitus without complications: Secondary | ICD-10-CM | POA: Insufficient documentation

## 2011-03-29 DIAGNOSIS — I4891 Unspecified atrial fibrillation: Secondary | ICD-10-CM | POA: Insufficient documentation

## 2011-03-29 DIAGNOSIS — E669 Obesity, unspecified: Secondary | ICD-10-CM | POA: Insufficient documentation

## 2011-03-29 DIAGNOSIS — I2789 Other specified pulmonary heart diseases: Secondary | ICD-10-CM | POA: Insufficient documentation

## 2011-03-29 DIAGNOSIS — R002 Palpitations: Secondary | ICD-10-CM | POA: Insufficient documentation

## 2011-03-29 DIAGNOSIS — E039 Hypothyroidism, unspecified: Secondary | ICD-10-CM | POA: Insufficient documentation

## 2011-04-01 ENCOUNTER — Encounter: Payer: Self-pay | Admitting: Internal Medicine

## 2011-04-04 ENCOUNTER — Ambulatory Visit (INDEPENDENT_AMBULATORY_CARE_PROVIDER_SITE_OTHER): Payer: Medicare Other | Admitting: Internal Medicine

## 2011-04-04 ENCOUNTER — Encounter: Payer: Self-pay | Admitting: Internal Medicine

## 2011-04-04 VITALS — BP 118/70 | HR 74 | Ht 72.0 in | Wt 250.0 lb

## 2011-04-04 DIAGNOSIS — G4730 Sleep apnea, unspecified: Secondary | ICD-10-CM

## 2011-04-04 NOTE — Assessment & Plan Note (Signed)
We may be forced to give up on CPAP therapy. I would like to have the daytime Sleep Center staff work with him. In particular, I need to be sure his Ramp is adjusted well. Consider autotitration. He has his own teeth and might be a candidate for an oral appliance.

## 2011-04-04 NOTE — Patient Instructions (Signed)
Order- [facility]- please arrange daytime  Sleep center staff cpap assessment- pressure adjustment, mask comfort and Ramp, of his current machine.

## 2011-04-04 NOTE — Progress Notes (Signed)
  Subjective:    Patient ID: [name] [name], male    DOB: [birth year], 77 y.o.   MRN: [id number]  HPI He was unable to continue CPAP. It starts out fine but when he gets up for bathroom at night, then comes back to bed, the mask always leaks and he is unable to tolerate. He is still having to get up 2-3 x / night for bathroom. He disconnects hose and keeps mask on. He thinks the mask warms  and softens with body heat. Problems center around his need to get up frequently, but he denies any cardiac events since last here.   Review of Systems See HPI Constitutional:   No weight loss, night sweats,  Fevers, chills, fatigue, lassitude. HEENT:   No headaches,  Difficulty swallowing,  Tooth/dental problems,  Sore throat,                No sneezing, itching, ear ache, nasal congestion, post nasal drip,   CV:  No chest pain,  Orthopnea, PND, swelling in lower extremities, anasarca, dizziness, palpitations  GI  No heartburn, indigestion, abdominal pain, nausea, vomiting, diarrhea, change in bowel habits, loss of appetite  Resp: No shortness of breath with exertion or at rest.  No excess mucus, no productive cough,  No non-productive cough,  No coughing up of blood.  No change in color of mucus.  No wheezing.   Skin: no rash or lesions.  GU: no dysuria, change in color of urine, no urgency or frequency.  No flank pain.  MS:  No joint pain or swelling.  No decreased range of motion.  No back pain.  Psych:  No change in mood or affect. No depression or anxiety.  No memory loss.      Objective:   Physical Exam General- Alert, Oriented, Affect-appropriate, Distress- none acute  Skin- rash-none, lesions- none, excoriation- none  Lymphadenopathy- none  Head- atraumatic  Eyes- Gross vision intact, PERRLA, conjunctivae clear secretions  Ears-  Normal-Hearing, canals, Tm   Nose- Clear,  No- Septal dev, mucus, polyps, erosion, perforation   Throat- Mallampati II , mucosa clear , drainage- none,  tonsils- atrophic. Lips and tongue dry and a blood spot on right lower lip.   Neck- flexible , trachea midline, no stridor , thyroid nl, carotid no bruit  Chest - symmetrical excursion , unlabored     Heart/CV- RRR , no murmur , no gallop  , no rub, nl s1 s2                     - JVD- none , edema- none, stasis changes- none, varices- none     Lung- clear to P&A, wheeze- none, cough- none , dullness-none, rub- none     Chest wall- Abd- tender-no, distended-no, bowel sounds-present, HSM- no  Br/ Gen/ Rectal- Not done, not indicated  Extrem- cyanosis- none, clubbing, none, atrophy- none, strength- nl  Neuro- grossly intact to observation         Assessment & Plan:

## 2011-04-05 ENCOUNTER — Encounter (INDEPENDENT_AMBULATORY_CARE_PROVIDER_SITE_OTHER): Payer: Medicare Other

## 2011-04-05 DIAGNOSIS — R0989 Other specified symptoms and signs involving the circulatory and respiratory systems: Secondary | ICD-10-CM

## 2011-04-06 ENCOUNTER — Encounter: Payer: Self-pay | Admitting: Internal Medicine

## 2011-04-07 ENCOUNTER — Encounter (HOSPITAL_COMMUNITY): Payer: Medicare Other | Attending: Internal Medicine

## 2011-04-07 ENCOUNTER — Other Ambulatory Visit: Payer: Self-pay | Admitting: Internal Medicine

## 2011-04-07 DIAGNOSIS — R002 Palpitations: Secondary | ICD-10-CM | POA: Insufficient documentation

## 2011-04-07 DIAGNOSIS — G4733 Obstructive sleep apnea (adult) (pediatric): Secondary | ICD-10-CM | POA: Insufficient documentation

## 2011-04-07 DIAGNOSIS — I2789 Other specified pulmonary heart diseases: Secondary | ICD-10-CM | POA: Insufficient documentation

## 2011-04-07 DIAGNOSIS — E669 Obesity, unspecified: Secondary | ICD-10-CM | POA: Insufficient documentation

## 2011-04-07 DIAGNOSIS — E785 Hyperlipidemia, unspecified: Secondary | ICD-10-CM | POA: Insufficient documentation

## 2011-04-07 DIAGNOSIS — I4891 Unspecified atrial fibrillation: Secondary | ICD-10-CM | POA: Insufficient documentation

## 2011-04-07 DIAGNOSIS — E039 Hypothyroidism, unspecified: Secondary | ICD-10-CM | POA: Insufficient documentation

## 2011-04-07 DIAGNOSIS — Z5189 Encounter for other specified aftercare: Secondary | ICD-10-CM | POA: Insufficient documentation

## 2011-04-07 DIAGNOSIS — E119 Type 2 diabetes mellitus without complications: Secondary | ICD-10-CM | POA: Insufficient documentation

## 2011-04-12 ENCOUNTER — Other Ambulatory Visit: Payer: Self-pay | Admitting: Internal Medicine

## 2011-04-12 ENCOUNTER — Encounter (HOSPITAL_COMMUNITY): Payer: Medicare Other

## 2011-04-13 ENCOUNTER — Other Ambulatory Visit: Payer: Self-pay | Admitting: Internal Medicine

## 2011-04-13 LAB — GLUCOSE, CAPILLARY
Glucose-Capillary: 171 mg/dL — ABNORMAL HIGH (ref 70–99)
Glucose-Capillary: 179 mg/dL — ABNORMAL HIGH (ref 70–99)

## 2011-04-14 ENCOUNTER — Other Ambulatory Visit: Payer: Self-pay | Admitting: Internal Medicine

## 2011-04-14 ENCOUNTER — Encounter (HOSPITAL_COMMUNITY): Payer: Medicare Other

## 2011-04-19 ENCOUNTER — Encounter (HOSPITAL_COMMUNITY): Payer: Medicare Other

## 2011-04-19 NOTE — Assessment & Plan Note (Signed)
[facility]                            CARDIOLOGY OFFICE NOTE   NAME:[name] [name], [name] [name]                      MRN:          [id number]  DATE:09/24/2008                            DOB:          [birth year]    PRIMARY CARE PHYSICIAN:  Rosalyn Gess. Norins, MD   INTERVAL HISTORY:  Mr. Manera is a very pleasant 77 year old male with a  history of obesity, chronic atrial fibrillation, diabetes,  hyperlipidemia, obstructive sleep apnea who is intolerant of his CPAP  returns today for routine followup.   Overall, he says he is doing fairly well.  He denies any palpitations.  He has not had any chest pain.  No syncope or presyncope.  He does say  that he selectively sleeps in a recliner just because it is more  comfortable.  He denies orthopnea or PND.  He does get occasional lower  extremity edema, but this is not severe.   He does note occasional shortness of breath when he bends over or he  gets in his truck, otherwise he is okay.   CURRENT MEDICATIONS:  1. Warfarin.  2. Digoxin 0.25 mg a day.  3. Doxazosin 4 mg a day.  4. Lasix 20 a day.  5. Lantus insulin.  6. Lovastatin 20 a day.  7. Metformin 1000 b.i.d.  8. Toprol 200 a day.  9. Potassium.  10.Sanctura 20 mg b.i.d.  11.Synthroid 100 mcg a day.  12.Stool softener.   PHYSICAL EXAMINATION:  GENERAL:  He ambulates in the clinic slowly  without any respiratory difficulty.  Resting pulse oximetry is 93-94%,  which does not change with activity.  HEENT:  He is a bit of a ruddy complexion, otherwise normal.  NECK:  Supple and thick.  No obvious JVD.  Carotids are 2+ bilaterally  without bruits.  There is no lymphadenopathy or thyromegaly.  CARDIAC:  He is barrel-chested.  PMI is not palpable.  He has distant  heart sounds.  He is irregularly irregular with no obvious murmurs.  LUNGS:  Clear with mildly diminished breath sounds throughout.  No  wheezes.  ABDOMEN:  Obese, nontender, nondistended.  No  hepatosplenomegaly  appreciated.  No bruits, no masses.  EXTREMITIES:  Warm.  There is no cyanosis, clubbing or edema.  He has no  cyanosis or clubbing.  There is trace edema and changes consistent with  chronic venous stasis.  NEUROLOGIC:  Alert and oriented x3.  Cranial nerves II-XII are intact.  Moves all 4 extremities without difficulty.  Affect is pleasant.   On reviewing his echocardiogram, which I have done personally, his  ejection fraction is normal.  He has marked biatrial enlargement.  Ejection fraction of 60%.  His RV is mildly dilated with perhaps mild to  moderately dilated with mild hypokinesis.  He has got atrial  fibrillation at a rate of 79.   ASSESSMENT AND PLAN:  1. Atrial fibrillation.  This is chronic and relatively asymptomatic.      Given the size of his left atrium, he is not a candidate for      cardioversion.  We will continue  Coumadin.  2. Abnormal echocardiograms.  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  His volume status looks okay at this      point.  We would continue his current medications.   DISPOSITION:  We will see him back in clinic in 6-9 months for a routine  followup.     [name], MD  Electronically Signed    DRB/MedQ  DD: 09/24/2008  DT: 09/25/2008  Job #: [id number]

## 2011-04-19 NOTE — Assessment & Plan Note (Signed)
[facility]                             PULMONARY OFFICE NOTE   NAME:[name] [name], [name]                      MRN:          [id number]  DATE:06/13/2007                            DOB:          15-Sep-1931    PRIMARY PHYSICIAN:  Rosalyn Gess. Norins, M.D.   CARDIOLOGIST:  [name], M.D.   GASTROENTEROLOGIST:  Rachael Fee, M.D.   PROBLEM LIST:  1. Obstructive sleep apnea.  2. Atrial fibrillation.  3. Diabetes.  4. History of cerebrovascular accident.   HISTORY:  His sleep study done June 9th, showed severe obstructive apnea  with an AHI of 80.7 per hour.  He had to sleep in a recliner chair.  Snoring was moderate with oxygen desaturation to 82%.  CPAP was titrated  to 20-CWP for an AHI of zero.  I reviewed all of this.  His hip pain is  a significant limiting problem which is what requires sleeping in a  chair.  We discussed the physiology, medical concerns, and available  treatments for sleep apnea.  He is not happy about it but willing to try  CPAP.   MEDICATIONS:  1. Coumadin.  2. Digoxin 0.25 mg.  3. Doxazosin 4 mg.  4. Glipizide 5 mg.  5. Metformin 1,000 mg b.i.d.  6. Sanctura 20 mg b.i.d.  7. Synthroid 100 mcg.  8. Lovastatin 20 mg.  9. Metoprolol 100 mg.  10.Potassium.  11.Pantoprazole 30 mg.  12.Tylenol P.M. occasional.   DRUG INTOLERANT:  PENICILLIN.   OBJECTIVE:  VITAL SIGNS:  Weight 241 pounds, BP 122/76, pulse 80, room  air saturation 95%.  GENERAL:  He is alert.  HEENT:  Nasal airway is not obstructed.  LUNGS:  Breathing is unlabored today.   IMPRESSION:  Obstructive sleep apnea which responded well to high  pressure CPAP.  He is borderline and may need a BiPAP machine for  pressure relief.   PLAN:  1. Try initially CPAP at 20-CWP, supplemented p.r.n. with temazepam 15      mg at h.s. as discussed.  2. Schedule return in 1 month, earlier p.r.n.     [name], MD, [name], FACP  Electronically  Signed    CDY/MedQ  DD: 06/30/2007  DT: 07/01/2007  Job #: [id number]

## 2011-04-19 NOTE — Assessment & Plan Note (Signed)
[facility]                             PULMONARY OFFICE NOTE   NAME:[name], [name] [name]                      MRN:          [id number]  DATE:08/16/2007                            DOB:          Apr 24, 1931    PRIMARY CARE PHYSICIAN:  Rosalyn Gess. Norins, M.D.   PROBLEM LIST:  1. Obstructive sleep apnea.  2. Arthritis/hip pain.  3. Atrial fibrillation.  4. Diabetes.   HISTORY:  He says he has been doing much better since he has been using  an auto-titration BiPAP machine.  He never turned the chip in.  He still  takes the mask off after he gets up for bathroom in the middle of the  night, but he says he is satisfied with this machine.  He still does not  like the mask a great deal, and we discussed options.  He really likes  the humidifier.  He does not feel more rested, still sleeping every  night in a recliner.   MEDICATION LIST:  Charted and reviewed with him.   OBJECTIVE:  Weight 251 pounds, BP 140/76, pulse 92, room air saturation  94%.  He seemed a little plethoric in the face.  There was no edema or neck  vein distension.  He was alert.  Speech was clear.  There was no tremor.   IMPRESSION:  1. Obstructive sleep apnea, still with only fair tolerance of PAP      therapy, currently using auto-titration BiPAP.  2. Sleep is limited by hip pain and need to sleep in a recliner.  3. He has had some nocturia and is working with Dr. Isabel Caprice.  4. We are going to schedule return in 1 year to leave options open,      but he will contact us earlier as needed.     [name], MD, [name], FACP  Electronically Signed    CDY/MedQ  DD: 08/16/2007  DT: 08/17/2007  Job #: [id number]

## 2011-04-19 NOTE — Assessment & Plan Note (Signed)
[facility]                            CARDIOLOGY OFFICE NOTE   NAME:[name] [name], [name] [name]                      MRN:          [id number]  DATE:05/14/2007                            DOB:          Oct 27, 1931    PRIMARY CARE PHYSICIAN:  Dr. Illene Regulus.   INTERVAL HISTORY:  [name] [name] is a very pleasant 77 year old male with a  history of atrial fibrillation, diabetes, hyperlipidemia and obesity who  returns for routine followup.   [name] [name] says that he has not been feeling well for the past month, he  attributes this mostly to his sinus congestion.  He denies any chest  pain or shortness of breath, but continues to say that he has no energy.  He is scheduled for a sleep study tonight.  He is not having any  orthopnea, PND or low extremity edema.  He denies palpitations, syncope  or presyncope.   CURRENT MEDICATIONS:  1. Coumadin.  2. Digoxin 0.25 mg daily.  3. Doxazosin 4 mg a day.  4. Glipizide 5 mg daily.  5. Metformin 1000 b.i.d.  6. Sanctura 20 mg b.i.d.  7. Synthroid 100 mcg daily.  8. Lovastatin 20 daily.  9. Metoprolol 100 mg daily.  10.Potassium.  11.Pantoprazole.   PHYSICAL EXAMINATION:  He is an elderly male, ambulates around the  clinic and in no acute distress.  Respirations are unlabored.  He does  appear fatigued.  HEENT:  Is normal.  NECK:  Is supple.  There is no obvious JVD.  Carotids are 2+ bilaterally  without any bruits.  There is no lymphadenopathy or thyromegaly.  CARDIAC:  He has distant heart sounds.  PMI is nondisplaced.  He is  irregularly irregular with no murmurs, rubs or gallops.  LUNGS:  Are clear.  ABDOMEN:  Is obese, nontender, nondistended.  There is no  hepatosplenomegaly, no bruits, no masses, good bowel sounds.  EXTREMITIES:  Are warm with no cyanosis, clubbing or edema.  No rash.  NEURO:  He is alert and oriented x3.  Cranial nerves II through XII are  grossly intact.  He moves all 4 extremities  without difficulty.  Affect  is normal.   EKG shows atrial fibrillation with a rapid ventricular response at 108  beats per minute.  Minimal nonspecific ST-T wave abnormalities.   ASSESSMENT AND PLAN:  1. Atrial fibrillation.  His rate is elevated today.  We will go ahead      and increase his Toprol to 200 mg once a day. Continue Coumadin.  2. Fatigue.  I suspect this is multifactorial.  He is due for a sleep      study today.  Will also check a TSH and a CBC to make sure these      are within normal limits.  3. Hyperlipidemia.  He is on a statin but previously had severe      hypertriglyceridemia.  We actually called in some Lopid but this      was never filled.  We will recheck his lipids today.  I suspect we      may need to  give him another prescription for Lopid or niacin.   DISPOSITION:  Return to clinic in 1 to 2 months for routine followup.     [name] Buckles. Bensimhon, MD  Electronically Signed    DRB/MedQ  DD: 05/14/2007  DT: 05/15/2007  Job #: [id number]   cc:   [name] Gess. Norins, MD

## 2011-04-19 NOTE — Assessment & Plan Note (Signed)
[facility]                            CARDIOLOGY OFFICE NOTE   NAME:HODGINAuguste, Tebbetts                      MRN:          [id number]  DATE:08/21/2007                            DOB:          12-May-1931    PRIMARY CARE PHYSICIAN:  Dr. Rosalyn Gess. Norins.   INTERVAL HISTORY:  Mr. Dobek is a very pleasant 77 year old male with a  history of chronic atrial fibrillation, diabetes, hyperlipidemia and  obesity and sleep apnea, who returns today for routine followup.   Overall, he says he is doing fairly well.  Dr. Debby Bud has been helping  him get his diabetes under control, but unfortunately he has had two  diabetic seizures, including one last night.  He got up and had some  orange juice and peanut butter and felt better.  He denies any chest  pain.  He does have some chronic shortness of breath but this is  unchanged.  He has been wearing his CPAP for about four to five hours a  night, but has a hard time using it for longer than that.  He denies any  significant palpitations.  No syncope or pre-syncope, no orthopnea.  Does have some mild lower extremity edema.  No problems with bleeding.   CURRENT MEDICATIONS:  1. Coumadin.  2. Digoxin 0.25 mg daily.  3. Doxazosin 4 mg daily.  4. Glipizide 5 mg daily.  5. Metformin 1000 mg twice daily.  6. Sanctura 20 mg twice daily.  7. Synthroid 100 mcg daily.  8. Lovastatin 20 mg daily.  9. Metoprolol 200 mg daily.  10.Potassium.  11.Insulin.  12.Protonix.   PHYSICAL EXAMINATION:  GENERAL:  He is an elderly male.  He ambulates  around the clinic slowly with no acute respiratory distress.  Respirations unlabored.  HEENT:  Normal.  NECK:  Supple, no obvious jugular venous distention though it is hard to  tell.  Carotids are 2+ bilaterally without bruits.  No lymphadenopathy  or thyromegaly.  HEART:  Distant heart sounds.  PMI is nondisplaced.  He is irregularly  irregular with no murmurs, rubs or gallops.  LUNGS:  Clear.  ABDOMEN:  Obese, nontender, non-distended.  No hepatosplenomegaly, no  bruits, no masses appreciated.  Good bowel sounds.  EXTREMITIES:  Warm with no clubbing or cyanosis.  There is 1-2+ edema  bilaterally.  No rash.  NEUROLOGIC:  He is alert and oriented x3.  Cranial nerves II-XII  grossly intact.  He moves all four extremities without difficulty.  Affect is normal.   Electrocardiogram shows atrial fibrillation with a ventricular rate of  91.  No significant ST-T-wave abnormalities.   A Holter monitor from August shows chronic atrial fibrillation with  occasional PVCs.  The mean ventricular response was 80 beats per minute.  There was occasional two-second pauses.   ASSESSMENT/PLAN:  1. Atrial fibrillation:  He is doing well.  He is on his Coumadin      without any bleeding.  His ventricular rate is well-controlled.  2. Lower extremity edema:  Will add Lasix 20 mg daily.  Will watch his  potassium.  Will recheck a BMET in one week.  3. Hyperlipidemia:  Most recent cholesterol panel showed a total      cholesterol of 133, triglycerides of 203, HDL 26 and LDL of 66.      While his LDL is at goal, his triglycerides are elevated and his      HDL is low, consistent with his diabetic state.  I did suggest      Niaspan; however, he cannot afford this due to the donut hole.  We      could consider Lopid but that is fairly expensive as well.  We will      start with fish oil 2 grams daily.  He will follow up with both me      and with Dr. Debby Bud.  4. Diabetes:  Followed by Dr. Debby Bud.  I have asked him to contact Dr.      Debby Bud regarding his recent diabetic seizure.   DISPOSITION:  Return to the clinic in several months for routine  followup.     [name], MD  Electronically Signed    DRB/MedQ  DD: 08/21/2007  DT: 08/21/2007  Job #: [id number]

## 2011-04-19 NOTE — Assessment & Plan Note (Signed)
[facility]                             PULMONARY OFFICE NOTE   NAME:[name] [name], [name] [name]                      MRN:          [id number]  DATE:07/17/2007                            DOB:          10/26/1931    PROBLEM:  1. Obstructive sleep apnea.  2. Arthritis/hip pain.  3. Atrial fibrillation.  4. Diabetes.   HISTORY:  His experience so far with CPAP has been difficult.  Pressure  is set at 20 CWP, and the home care company did not respond to my  prescription for change to bilevel.  This pressure is just higher than  he can tolerate.  He is having trouble with dry mouth.  He continues to  sleep usually in a recliner because of his hip.   His medication list was charted and reviewed.   OBJECTIVE:  Weight 244 pounds.  BP 142/82.  Pulse 95.  Room air  saturation 96%.  He is somewhat overweight.  He seems weary.  There are no pressure marks  on his face from the mask.  Nasal airway is not obstructed.  Breathing  is unlabored.   IMPRESSION:  Obstructive sleep apnea.  His initial motivation to make  CPAP work was not good, and I am not surprised he is having difficulty.  We went over the results of his sleep study again, explaining the  medical concerns associated with severe sleep apnea left untreated, and  we again discussed treatment options.   PLAN:  We are going to auto titrate him with a bilevel machine.  Meanwhile, I have given him suggestion to try Salivart for his dry  mouth.  Schedule return in 1 month, earlier p.r.n.     [name], MD, [name] [name], FACP  Electronically Signed    CDY/MedQ  DD: 07/17/2007  DT: 07/18/2007  Job #: [id number]

## 2011-04-19 NOTE — Assessment & Plan Note (Signed)
[facility]                            CARDIOLOGY OFFICE NOTE   NAME:[name] [name]                      MRN:          [id number]  DATE:01/11/2008                            DOB:          03/30/1931    PRIMARY CARE PHYSICIAN:  Dr. Christella Noa.   HISTORY:  [name] [name] is a very pleasant 77 year old male with a history  of obesity, chronic atrial fibrillation, diabetes, hyperlipidemia and  obstructive sleep apnea who is intolerant to CPAP, returns today for  routine follow-up.   Overall, he says he is doing fairly well.  He has gotten his sugars  under better control.  He denies any chest pain.  He does have some  shortness of breath, particularly when he bends over.  This is somewhat  chronic.  He continues to have chronic ankle edema which is really  unchanged.  He denies any palpitations.  No syncope or presyncope.   CURRENT MEDICATIONS:  1. Coumadin.  2. Digoxin 0.25 a day.  3. Doxazosin 4 mg day.  4. Lasix 20 a day.  5. Lantus insulin.  6. Lovastatin 20 a day.  7. Metformin 1000 b.i.d.  8. Metoprolol 200 a day.  9. Minocycline 100 b.i.d.  10.Potassium.  11.Omeprazole 30 a day.  12.Restasis.  13.Sanctura 20 mg b.i.d.  14.Synthroid 100 mcg a day.  15.Stool softener.   PHYSICAL EXAMINATION:  GENERAL:  Elderly male in no acute distress,  ambulates around the clinic slowly but with no respiratory difficulty.  VITAL SIGNS:  Blood pressure is 100/70, heart rate 83, weight 241.  HEENT:  Normal.  NECK:  Supple.  No obvious JVD.  Carotids are 2+ bilateral bruits.  There is no lymphadenopathy or thyromegaly.  CARDIAC:  He has distant heart sounds.  PMI is non displaced.  He is  irregular with no murmurs, rubs or gallops.  LUNGS:  Clear.  ABDOMEN:  Obese, nontender, nondistended.  No hepatosplenomegaly, no  bruits, no masses appreciated.  Good bowel sounds.  EXTREMITIES:  Warm with no cyanosis, clubbing or edema.  There is 2+  edema just  around the ankles with chronic venous stasis changes and  evidence of previous healed ulcers.  NEUROLOGICAL:  He is alert and x3.  Cranial nerves II-XII are intact.  Moves all four extremities without difficulty.  Affect is normal.  EKG  shows atrial fibrillation at a rate 83, nonspecific T-wave changes.   ASSESSMENT/PLAN:  1. Atrial fibrillation.  He is doing well.  He is well rate      controlled.  Continue Coumadin.  2. Hyperlipidemia, most recent LDL was 49.  This is followed by Dr.      Debby [name].  He is at goal.  Continue statin.  3. Lower extremity edema.  He is Lasix.  I think this is probably      mostly venous stasis.  However, we will check an echocardiogram to      make sure his LV function is preserved.  He did have a stress test      back in 2007 with [facility] cardiology which was reportedly normal.  DISPOSITION:  Will see him back in clinic in about 6 months.     [name], MD  Electronically Signed    DRB/MedQ  DD: 01/11/2008  DT: 01/14/2008  Job #: [id number]   cc:   [name] Gess. Norins, MD

## 2011-04-19 NOTE — Discharge Summary (Signed)
[name] [name], [name] [name]               ACCOUNT NO.:  [id number]   MEDICAL RECORD NO.:  [id number]          PATIENT TYPE:  INP   LOCATION:  1430                         FACILITY:  [facility]   PHYSICIAN:  [name] Levins, MD      DATE OF BIRTH:  [birth year]   DATE OF ADMISSION:  05/11/2009  DATE OF DISCHARGE:  05/13/2009                               DISCHARGE SUMMARY   DISCHARGE DIAGNOSES:  1. Right side pneumonia, specifically right upper lobe and right lower      lobe.  2. Acute renal insufficiency, mild.  3. Diabetes mellitus.  4. Mild hypernatremia.  5. Initial somnolence, improved with treatment.  6. Chronic atrial fibrillation.  7. Chronic Coumadin anticoagulation therapy.  8. Hypothyroidism.  9. Constipation.  10.Obstructive sleep apnea.   PROCEDURES:  None.   CONSULTS:  None.   HISTORY AND PHYSICAL:  See that dictated date of admission.   HOSPITAL COURSE:  [name] [name] is a 77 year old white male who presented  with fatigue, cough and hypoglycemia.  He has a history of multiple  medical problems.  He had several episodes of hypoglycemia on current  medications and several days of productive cough and greenish sputum.  There was a CBG of 69 on the morning of admission.  He had complained of  generalized weakness, fatigue and somnolence as well as shortness of  breath on exertion with decreased p.o. intake for the week prior to  admission.  He denied any chest pain, palpitations or worsening lower  extremity swelling.  Chest x-ray on admission showed right upper lobe  and right lower lobe airspace disease consistent with pneumonia.  There  was some mild renal insufficiency acutely noted with BUN 32 and  creatinine 1.54, sodium was slightly elevated at 147, initial white  blood cell count was 6, no shift and hemoglobin 13.5.  He did have a TSH  on admission which proved within normal limits on current dose of  Synthroid.  He had significant constipation which was improved with  laxative therapy.  He has been intolerant of CPAP in the past.  Treatment including low-dose IV fluids for mild volume depletion on  admission, IV antibiotics and oxygen and with small reduction in his  Lantus he rapidly improved over the next 24 - 48 hours.  Hypoglycemia  which resolved by the second day and discontinued on slightly lower  Lantus.  Hemoglobin A1c proved 6.8.  Also, digoxin 0.5.  With respect to  his Coumadin therapy, his INRs were slightly on the low side at 1.9,  there was no overt bleeding or bruising.  Constipation was resolved.  There was no evidence of volume overload and IV fluids were discontinued  on the second day.  Somnolence resolved with IV antibiotics and  resolution of low sugar.  Sodium returned to normal.  On the second day  he described near resolution of cough, denied significant shortness of  breath or dyspnea on exertion beyond baseline.  His followup BUN and  creatinine were improved to 27 and 1.31.  Sugars throughout his  hospitalization were in the low 100s except  for the day of discharge  when they were 193.  Has been eating well, ambulatory, pulmonary status  markedly improved, metabolic and renal function improved, hypoglycemia  resolved.  It was felt he had gained maximal benefit from this  hospitalization and is to be discharged home.   DISPOSITION:  Discharged to home in good condition.  He is to follow a  diabetic diet as before.  He is to follow up with Dr. Debby Bud within 7 -  10 days.   DISCHARGE MEDICATIONS:  1. Avelox 400 mg p.o. daily for 7 days.  2. Slightly reduced Lantus to 80 units subcutaneous nightly.   Continue other home medications including:  1. Actos 30 mg p.o. daily.  2. Digoxin 0.25 mg p.o. daily.  3. Doxazosin 4 mg nightly.  4. Furosemide 20 mg p.o. daily.  5. Levothyroxine 100 mcg p.o. daily.  6. Lovastatin 20 mg p.o. daily.  7. Metformin 1000 mg b.i.d.  8. Metoprolol 100 mg p.o. daily.  9. Potassium citrate 1000  mg daily.  10.Sanctura 20 mg b.i.d.  11.Coumadin 5 mg as directed.      [name] Levins, MD  Electronically Signed     JWJ/MEDQ  D:  05/13/2009  T:  05/13/2009  Job:  [id number]   cc:   [name] Gess. Norins, MD  [address]  [place]  [place] [address]

## 2011-04-19 NOTE — Procedures (Signed)
[name] [name], [name] [name]               ACCOUNT NO.:  [id number]   MEDICAL RECORD NO.:  [id number]          PATIENT TYPE:  OUT   LOCATION:  SLEEP CENTER                 FACILITY:  [facility]   PHYSICIAN:  [name], MD, FCCP, FACPDATE OF BIRTH:  06-Dec-1930   DATE OF STUDY:  05/14/2007                            NOCTURNAL POLYSOMNOGRAM   REFERRING PHYSICIAN:  Clinton D. Young, MD, FCCP, FACP   INDICATION FOR STUDY:  Sleep apnea and hypersomnia.   EPWORTH SLEEPINESS SCORE:  20/24, BMI 32.4, weight 240 pounds.   MEDICATIONS:  Home medications are listed and reviewed.   SLEEP ARCHITECTURE:  Short total sleep time 274 minutes with sleep  efficiency 68%.  Stage I was 30%, stage II 70%, stages III, IV, and REM  were absent.  Sleep latency 7 minutes.  Awake after sleep onset 121  minutes.  Arousal index 35.4.  There was marked sleep fragmentation at  EEG arousal throughout the study.  The patient had taken Tylenol PM  before arrival at the sleep center.   RESPIRATORY DATA:  Split study protocol.  Apnea/hypopnea index (AHI,  RDI) 80.7 obstructive events per hour indicating severe obstructive  sleep apnea/hypopnea syndrome before CPAP.  There were 167 obstructive  apneas and 22 hypopneas before CPAP.  He slept in a chair.  REM AHI NA.  CPAP was titrated to 20 CWP, AHI zero per hour.  A large ResMed Quattro  full-face mask was used with a heated humidifier and was fairly well  tolerated.   OXYGEN DATA:  Moderate snoring with oxygen desaturation to a nadir of  82% before CPAP.  After CPAP control, saturation held 93% on room air.   CARDIAC DATA:  Atrial fibrillation with an average heart rate of 80 per  minute.   MOVEMENT-PARASOMNIA:  No significant movement disturbance.  Bathroom x2.   IMPRESSIONS-RECOMMENDATIONS:  1. Severe obstructive sleep apnea/hypopnea syndrome, AHI 80.7 per      hour.  He slept in a recliner chair.  Moderate snoring with oxygen      desaturation to a nadir of  82%.  2. CPAP titration to 20 CWP, AHI zero per hour.  A large ResMed      Quattro full-face mask was used with a heated humidifier.      [name], MD, [facility], FACP  Diplomate, Biomedical engineer of Sleep Medicine  Electronically Signed     CDY/MEDQ  D:  05/20/2007 11:36:04  T:  05/20/2007 45:40:98  Job:  [id number]

## 2011-04-21 ENCOUNTER — Encounter (HOSPITAL_COMMUNITY): Payer: Medicare Other

## 2011-04-22 NOTE — Op Note (Signed)
NAMEJUJHAR, EVERETT                         ACCOUNT NO.:  [id number]   MEDICAL RECORD NO.:  [id number]                   PATIENT TYPE:  AMB   LOCATION:  [facility]                                 FACILITY:  [facility]   PHYSICIAN:  Valetta Fuller, M.D.               DATE OF BIRTH:  [birth year]   DATE OF PROCEDURE:  01/26/2004  DATE OF DISCHARGE:  01/26/2004                                 OPERATIVE REPORT   PREOPERATIVE DIAGNOSES:  1. History of transitional cell carcinoma of the bladder.  2. Questionable small recurrences.   POSTOPERATIVE DIAGNOSES:  1. History of transitional cell carcinoma of the bladder.  2. Questionable small recurrences.   PROCEDURE PERFORMED:  Cystoscopy with biopsy and fulguration and bilateral  retrograde pyelography.   SURGEON:  Valetta Fuller, M.D.   ANESTHESIA:  General with LMA.   INDICATIONS:  Mr. Sluder is a 77 year old male.  He has a remote history of  superficial, noninvasive transitional cell carcinoma of the bladder.  Recent  cystoscopy in the office revealed a couple of small areas of increased  erythema and mucosal thickening.  We felt that these needed to be biopsied.   TECHNIQUE AND FINDINGS:  The patient was brought to the operating room,  where he had successful induction of general anesthesia.  He was placed in  the lithotomy position and prepped and draped in the usual manner.  Cystoscopy revealed some mild trilobar hyperplasia.  He did have some visual  obstruction.  The bladder showed some mild thickening.  There were several  areas scattered through his bladder that showed some increased erythema and  increased mucosal thickening.  These were concerning for the possibility of  transitional cell carcinoma.  We felt these areas needed to be biopsied and  then fulgurated.  Several cold cup biopsies were performed, and all the  suspicious areas were carefully fulgurated.  The bladder was then copiously  irrigated.  Bilateral retrograde  pyelograms showed no abnormalities.  The  patient appeared to tolerate the procedure well.  There were no obvious  complications.                                               Valetta Fuller, M.D.    DSG/MEDQ  D:  02/04/2004  T:  02/04/2004  Job:  [id number]

## 2011-04-22 NOTE — Discharge Summary (Signed)
NAMECHATHAM, HOWINGTON NO.:  [id number]   MEDICAL RECORD NO.:  [id number]          PATIENT TYPE:  INP   LOCATION:  3707                         FACILITY:  [facility]   PHYSICIAN:  Lyn Records, M.D.   DATE OF BIRTH:  [birth year]   DATE OF ADMISSION:  07/14/2006  DATE OF DISCHARGE:  07/17/2006                                 DISCHARGE SUMMARY   DISCHARGE DIAGNOSES:  1. Atrial fibrillation with controlled ventricular rate.  2. Long-term Coumadin use.  3. Chest pain, resolved.  4. Diabetes mellitus.  5. Hypothyroidism.  6. Chronic constipation.  7. Chronic headaches.  8. Chronic paresthesias in the lower extremities.  9. History of bladder cancer diagnosed in 1995 and followed by Dr. Isabel Caprice.  10.Mild impotence.  11.Long-term medication use.   HISTORY:  Mr. Youngblood is a 77 year old male patient of Dr. Verdis Prime who  has chronic atrial fibrillation and is on systemic anticoagulation.  Stress  test back in 2005 showed no evidence of an inducible ischemia.  On the day  of admission he came to the office stating that he had palpitations for  several days.  After the palpitations started, he began having a dull,  aching sensation in the mid-chest region radiating across the chest but not  into his arms or neck.  He did have some shortness of breath but no nausea  or diaphoresis.   He was seen in the office and then admitted to the hospital secondary to  poor rate control.  Ultimately with medications, we did assume rate control.  Particularly with his chest pain, we did perform an adenosine Cardiolite  that showed no ischemia.  On July 17, 2006 his rate was controlled and he  was ready for discharge to home.  Upon discharge, his pro time was 20.2 and  INR of 1.7.  He was discharged home in stable condition.  Lab work during  his stay included white count 6.4, hemoglobin 16, hematocrit 46.9, platelets  171, sodium 139, potassium 4.0, BUN 27, creatinine 1.5, LFTs  normal.  CK-MB  troponins negative.  TSH 3.070.   DISCHARGE MEDICATIONS:  Include the following:  1. Coumadin, as part of the admission.  2. Cardura 4 mg a day.  3. Digitek 0.25 mg a day.  4. Synthroid 100 mcg a day.  5. Glucophage 1000 mg b.i.d.  6. Atenolol 100 mg a day.  7. Sanctura 20 mg b.i.d.  8. Glipizide 2.5 mg a day.  9. Potassium as part of admission.  10.His new medication is Cardizem 180 mg a day.   He is to follow up with Dr. Katrinka Blazing on August 08, 2006 at 3:15 p.m.  Follow  up with the Coumadin clinic on July 21, 2006 at 1:45 p.m.  He is to call  for any recurrent symptoms.      Guy Franco, P.A.      Lyn Records, M.D.  Electronically Signed    LB/MEDQ  D:  08/09/2006  T:  08/09/2006  Job:  [id number]   cc:   Lyn Records, M.D.  Tasia Catchings, M.D.

## 2011-04-22 NOTE — Assessment & Plan Note (Signed)
 HEALTHCARE                         GASTROENTEROLOGY OFFICE NOTE   NAME:[name] [name], [name] [name]                      MRN:          [id number]  DATE:03/23/2007                            DOB:          1931-04-02    PRIMARY CARE PHYSICIAN:  Rosalyn Gess. Norins, MD.   GI PROBLEM LIST:  1. History of colon polyps. Colonoscopy was performed by Dr. Boyd Kerbs. Unclear pathology and unclear date of his last      colonoscopy. We will work to get these records sent over so we will      take over his colorectal cancer screening.  2. GERD, GERD related dyspepsia. EGD March 2008 by Dr. Christella Hartigan showed      mild reflux esophagitis.   INTERVAL HISTORY:  I last saw Mr. [name] [name] at the time of his EGD about a  month and a half ago. At that time, he was instructed to begin a PPI  once daily. He has not started that. It is not clear if we did not give  him a prescription or he got it and just did not fill it, but either way  he has been feeling a bit better since his EGD. He does have very  intermittent dysphagia. He said it is better than it was prior to the  EGD.   CURRENT MEDICATIONS:  Coumadin, Digitek, doxazosin, glipizide,  metformin, Sanctura, Synthroid, potassium citrate, a stool softener,  lovastatin, metoprolol, Levaquin, MiraLax.   PHYSICAL EXAMINATION:  VITAL SIGNS:  Weight is 237 pounds, blood  pressure 146/76, pulse 80.  HEART:  Regular rate and rhythm.  LUNGS:  Clear to auscultation bilaterally.  ABDOMEN:  Soft, nontender, nondistended, normal bowel sounds.   ASSESSMENT/PLAN:  A 77 year old man with gastroesophageal reflux disease  and gastroesophageal reflux disease related dyspepsia, personal history  of colon polyps.   First we will get the colonoscopy reports and biopsy reports sent over  from Dr. Lucretia Kern office and he will be set up for his next  colonoscopy at the proper interval according to current colorectal  cancer screening guidelines.  Second, his intermittent dysphagia has  improved since the EGD but he did not start proton pump inhibitor. He  does still have some dysphagia symptoms and I saw some mild inflammation  in his lower esophagus and so I think we should start him on proton pump  inhibitor once daily. I have given him samples for that, I have given  him a gastroesophageal reflux disease handout and I have also called in  a prescription for proton pump inhibitor once daily to be taken 20-30  minutes prior to his breakfast meal.     Rachael Fee, MD  Electronically Signed    DPJ/MedQ  DD: 03/23/2007  DT: 03/23/2007  Job #: [id number]   cc:   Rosalyn Gess. Norins, MD

## 2011-04-22 NOTE — Assessment & Plan Note (Signed)
[facility]                                 ON-CALL NOTE   NAME:HODGINFerry, Matthis                        MRN:          [id number]  DATE:09/25/2007                            DOB:          Apr 27, 1931    Phone message was received, and patient called back.  The patient  reports he had nausea with emesis x5-6 times today.  He reports he has  been regurgitating food and liquids.  No coffee-ground emesis, no blood.  The patient reports he feels feverish.  He has had no chest pain, no  shortness of breath.  He admits to some lower abdominal discomfort.  The  patient reports he had diarrhea for 2-3 days and had 4 loose stools at  the time I called him.  The patient does not feel in any acute distress  otherwise.  He reports it is difficult to look at food, has been able to  keep down fluids.   IMPRESSION:  Probable viral gastroenteritis.   PLAN:  1. Phenergan suppositories 25 mg 1 per rectum q. 6 h as needed.  2. Lomotil 1 after each loose stool, limit of 4 doses in 24 hours.  3. The patient is to work on taking down clear liquids with no      caffeine.  4. The patient is advised if he is unable to tolerate medications and      his symptoms are not at all improved, he should be seen at Urgent      Care this evening.  Otherwise, if he is able to do well through the      afternoon and evening, he should call the office in the morning for      an appointment.     Rosalyn Gess Norins, MD  Electronically Signed    MEN/MedQ  DD: 09/25/2007  DT: 09/25/2007  Job #: [id number]

## 2011-04-22 NOTE — Op Note (Signed)
NAMEJAQWON, MANFRED               ACCOUNT NO.:  [id number]   MEDICAL RECORD NO.:  [id number]          PATIENT TYPE:  AMB   LOCATION:  [facility]                         FACILITY:  [facility]   PHYSICIAN:  Valetta Fuller, M.D.  DATE OF BIRTH:  [birth year]   DATE OF PROCEDURE:  DATE OF DISCHARGE:                               OPERATIVE REPORT   PREOPERATIVE DIAGNOSES:  1. History of transitional cell carcinoma of the bladder.  2. Left ureteral calculi.  3. Left hydronephrosis.  4. Left renal calculi.   POSTOPERATIVE DIAGNOSES:  1. History of transitional cell carcinoma of the bladder.  2. Left ureteral calculi.  3. Left hydronephrosis.  4. Left renal calculi.   PROCEDURE PERFORMED:  Cystoscopy, left retrograde pyelography, left  ureteroscopy with holmium laser lithotripsy and basketing of fragments,  left retrograde pyelogram.   SURGEON:  Valetta Fuller, M.D.   ANESTHESIA:  General.   INDICATIONS:  Mr. Carchi is a 77 year old male with a complex urologic  history.  He has a history of bladder neck obstruction and voiding  dysfunction as well as transitional cell carcinoma of the bladder and  recurrent nephrolithiasis of both uric acid as well as calcium oxalate  stone disease.  The patient was due recently for follow-up cystoscopy  for a history of superficial, well-differentiated transitional cell  carcinoma of the bladder.  He had presented to see one of my partners in  my absence with left flank pain and was diagnosed with a 7 mm stone in  his proximal left ureter.  There was a smaller stone in his left renal  pelvis and several stones down in his lower pole calyx.  He was started  on urinary alkalinization, and his pain did resolve.  There was a period  of time when he may have had some concurrent cystitis, but culture was  negative, and he has been on antibiotic therapy.  He recently came in  feeling better with no pain for the last 10-14 days but without obvious  passage of  the stones.  He underwent a repeat CT to assess the  situation, and some of the stones in his lower pole did indeed seem to  be significantly smaller.  The 7 mm stone, however, was still in his mid  ureter, and there was a smaller, probable ureteral calculus as well.  At  that point, he had been having obstruction for about three weeks, and we  felt that, given the size of the stone, without any significant change,  it was better off to go ahead and intervene.  We also felt we could do  his bladder evaluation at that time as well.   TECHNIQUE/FINDINGS:  The patient was brought to the operating room,  where he had successful induction of general anesthesia.  He was placed  in the lithotomy position and prepped and draped in the usual manner.  Cystoscopy revealed an unremarkable anterior urethra.  The patient still  has some trilobar hyperplasia, but a reasonably open bladder neck.  Careful inspection of the bladder with both the right angle as well as  the  forward-looking lens systems failed to reveal any evidence of  obvious recurrent transitional cell carcinoma of the bladder and nothing  suggestive of carcinoma in situ.  There was no other bladder pathology.   Attention was turned towards left retrograde pyelography.  An open  catheter was used, and one could see a 7 mm filling defect in the mid  ureter, and a questionable smaller filling defect more proximal to this.  There was moderate hydronephrosis.  Fluoroscopic visualization was  performed for interpretation.   At the completion of retrograde, a sensor guidewire was placed to the  renal pelvis without difficulty.  The distal ureter was then easily  engaged without any need for dilation.  In the mid ureter, a 7 mm stone  was found and somewhat impacted.  It was somewhat rectangular in shape,  and one could see where it was probably not causing high grade  obstruction.  A holmium laser lithotripter fiber was used to break up  the  stone into approximately 4-5 pieces, which were then basket-  extracted.  More proximally, a second stone, measuring 4-5 mm, was  encountered, and that was also basket-extracted.  No additional ureteral  calculi were encountered, and no attempt was made to go up to the kidney  to try to get remaining small pieces out of the lower pole.  We felt  that everything went extremely well without any trauma, and there did  not appear to be a lot of mucosal edema.  There had been no need for  diltation; therefore, we felt a stent was not necessary.  The guidewire  was removed, and lidocaine jelly was instilled.  The patient appeared to  tolerate the procedure well.  There were no obvious complications.  He  was brought to the recovery room in stable condition.           ______________________________  Valetta Fuller, M.D.  Electronically Signed     DSG/MEDQ  D:  10/16/2006  T:  10/16/2006  Job:  [id number]

## 2011-04-22 NOTE — Op Note (Signed)
[name] [name], [name] [name]                         ACCOUNT NO.:  [id number]   MEDICAL RECORD NO.:  [id number]                   PATIENT TYPE:  AMB   LOCATION:  [facility]                                 FACILITY:  [facility]   PHYSICIAN:  Valetta Fuller, M.D.               DATE OF BIRTH:  05-Oct-1931   DATE OF PROCEDURE:  08/12/2004  DATE OF DISCHARGE:  08/12/2004                                 OPERATIVE REPORT   PREOPERATIVE DIAGNOSIS:  Multiple left renal calculi.   POSTOPERATIVE DIAGNOSIS:  Multiple left renal calculi.   PROCEDURE PERFORMED:  Cystoscopy, double-J stent removal, flexible  ureteroscopy, basketing of eight renal calculi, replacement of double-J  stent.   INDICATIONS:  [name] [name] is a 77 year old male.  He presented to my office  recently on an urgent basis with severe left flank pain.  At that time, he  was noted on noncontrast protocol CT to have a 6 mm stone in his proximal  left ureter.  In addition, there were several 4-5 mm stones in a dilated  left renal pelvis and a cluster of stones in a lower pole calyx.  Because of  unremitting pain, the patient had to have a double-J stent placed on an  urgent basis.  He subsequently came in with a febrile urinary tract  infection, which was treated.  He is now doing quite well clinically, he has  no further evidence of urinary tract infection and has been feeling quite  good.  The double-J stent has been in place for 10-14 days.  He presents now  for definitive management of his stones.   TECHNIQUE/FINDINGS:  The patient was brought to the operating room, where he  had successful induction of general anesthesia.  He was placed in the  lithotomy position and prepped and draped in the usual manner.  With  cystoscopy, we found his double-J stent to be in good position.  It was  partially removed, but we were unable to get the guidewire to go through the  stent.  For that reason, we placed the guidewire alongside the stent into  the  left renal pelvis.  The stent was completely removed.  An access sheath  was placed in the distal ureter over the guidewire.  Flexible ureteroscopy  was then performed.  Within the renal pelvis, one 6 mm stone was  encountered.  This was basket-extracted.  It would not fit through the  sheath, and therefore the access sheath had to be removed along with the  flexible scope and the stone.  The access sheath had to be reinserted.  With  repeat flexible ureteroscopy, we encountered a whole pile of stones within  the lower pole calyceal system.  There were probably 20 discrete stones.  The majority of these stones appeared to be 1-2 mm in size.  The angle was  quite acute; therefore, we did not feel that we would be able  to utilize the  holmium laser fiber and visually be able to get the scope to flex enough to  be able to visualize this pile of stones.  For that reason, we methodically  took out one stone at a time, extracting the eight largest stones from the  lower pole calyx.  With each one of these extractions, we generally had to  remove the access sheath because the stones would not quite fit through  there; therefore, we spent approximately an hour going through this, but  taking out the access sheath and then putting it back in and extracting  these stones.  At the completion of the procedure, the patient still had a  few small stones in the lower pole calyceal system, and did not appear to be  bigger than 2-3 mm in size.  We decided to place another double-J stent  because of all of the manipulation, although there was certainly no evidence  of any ureteral injury or significant trauma.  Again, we spent over an hour  doing this.  The patient appeared to tolerate the procedure well.  There  were no obvious complications.  He was brought to the recovery room in  satisfactory condition in stable condition.                                              Valetta Fuller, M.D.   DSG/MEDQ   D:  08/13/2004  T:  08/14/2004  Job:  [id number]

## 2011-04-22 NOTE — Assessment & Plan Note (Signed)
[place]                         GASTROENTEROLOGY OFFICE NOTE   NAME:HODGINJameir, Ake                      MRN:          [id number]  DATE:01/30/2007                            DOB:          1931/02/16    REFERRING PHYSICIAN:  Rosalyn Gess. Norins, MD   Dr. Debby Bud asked me to evaluate Mr. Etsitty in consultation regarding  dysphagia.   HISTORY OF PRESENT ILLNESS:  Mr. Geerts is a very pleasant 77 year old  man who has had somewhat progressive, intermittent solid food dysphagia  over the past several months.  He noticed this first when he ate a large  grape this past fall.  He did not chew the grape as his usual fashion,  it did not go down.  He ended up having to vomit it up.  He had another  recurrence with solid food dysphagia about a month ago, and since then  he feels like it is happening more frequently.  He has had no overt food  impactions other than that grape that resolve with vomiting.   REVIEW OF SYSTEMS:  Notable for 10 pound intentional weight loss in the  past year, no pyrosis or GERD symptoms.  No overt GI bleeding.  The rest  of his review of systems is essentially normal and is available on his  nursing intake sheet.   PAST MEDICAL HISTORY:  Appendectomy in 1947, kidney stone, TURP 2002,  arthritis, hemorrhoids, bladder cancer, diabetes, diverticulosis, colon  polyps followed by Dr. Sherin Quarry at [facility].  Last colonoscopy  one year ago.  York Spaniel he is due for another colonoscopy in 4 years from  now.  TIA, irritable bladder, atrial fibrillation, constipation well  treated with MiraLax, glaucoma.   CURRENT MEDICATIONS:  Atenolol, Coumadin, Digitek, doxazosin, Glipizide,  metformin, Sanctura, Synthroid, potassium citrate, and MiraLax.   ALLERGIES:  Drug allergies:  PENICILLIN.   SOCIAL HISTORY:  Married, 3 daughters, 1 son, retired, nonsmoker, quit  many years ago, rarely drinks.   FAMILY HISTORY:  One sister had colon  cancer.  Mother died 50 lung  cancer.   PHYSICAL EXAMINATION:  Six feet, 236 pounds, blood pressure 120/70,  pulse 60.  CONSTITUTIONAL:  Generally well appearing.  NEUROLOGIC:  Alert and oriented x3.  EYES:  Extraocular movements intact.  MOUTH:  Oropharynx moist, no lesions.  NECK:  Supple, no lymphadenopathy.  CARDIOVASCULAR:  Heart regular rate and rhythm.  LUNGS:  Clear to auscultation bilaterally.  ABDOMEN:  Soft, nontender, nondistended, normal bowel sounds.  EXTREMITIES:  No lower extremity edema.  SKIN:  No rashes or lesions of his lower extremities.   ASSESSMENT AND PLAN:  A 77 year old man with mildly progressive solid  food dysphagia over the past several weeks.   Stricturing disease from acid reflux, Schatzki's ring, possibly neoplasm  are all on the differential list.  We will arrange for him to have an  EGD early next week.  He will have to hold his Coumadin for 5 days prior  to then.  If I see a simple benign stricture that can be dilated it will  hopefully relieve his symptoms.  He  has a family history of colon cancer  and his last colonoscopy was about a year ago.  We will put him in our  reminder system here at [facility] to take over his colorectal screening.  I will do that at the time of his EGD.     Rachael Fee, MD  Electronically Signed    DPJ/MedQ  DD: 01/30/2007  DT: 01/30/2007  Job #: [id number]   cc:   Rosalyn Gess. Norins, MD

## 2011-04-22 NOTE — Op Note (Signed)
NAMETYVON, EGGENBERGER                         ACCOUNT NO.:  [id number]   MEDICAL RECORD NO.:  [id number]                   PATIENT TYPE:  AMB   LOCATION:  DAY                                  FACILITY:  [facility]   PHYSICIAN:  Valetta Fuller, M.D.               DATE OF BIRTH:  1931-01-13   DATE OF PROCEDURE:  07/17/2003  DATE OF DISCHARGE:                                 OPERATIVE REPORT   PREOPERATIVE DIAGNOSES:  1. Left flank pain.  2. Left ureteral stone.   POSTOPERATIVE DIAGNOSES:  1. Left flank pain.  2. No stone seen.   PROCEDURES PERFORMED:  1. Cystoscopy.  2. Retrograde pyelogram with ureteroscopy.   SURGEON:  Valetta Fuller, M.D.   ANESTHESIA:  General.   INDICATIONS:  Mr. Mcadams is a longstanding patient of mine with a multitude  of urologic issues.  He recently developed left flank pain and was seen in  the [facility].  There, a 3.5-4 mm proximal left ureteral stone was  noted, causing high-grade obstruction.  I saw the patient approximately 2-3  days later.  At that time, he was having fairly constant and fairly  significant flank discomfort.  We followed up with a repeat CT in our office  which showed continued high-grade obstruction of the kidney.  The IV  contrast that had been administered at [facility] was still within the collecting  system of the left kidney 48-72 hours after injection.  The stone itself  appeared to have migrated to the distal ureter but was still several  centimeters above the left ureterovesical junction.  The patient had some  business things to attend to and decided to delay any definitive surgery for  several days.  He was straining his urine and did not pass the stone.  He  was continuing to have intermittent discomfort.  He presents now for  evaluation and treatment of the stone.   TECHNIQUE AND FINDINGS:  The patient underwent successful induction of  general anesthesia and was then placed in lithotomy position and prepped  and  draped in the usual manner.  Cystoscopy revealed a fairly prominent median  bar but no evidence of other significant visual obstruction.  Examination of  his bladder revealed some edema and a little erythema of his intermural  ureter on the left.  Retrograde pyelogram, however, showed no evidence of  obstruction or hydronephrosis.  I could not appreciate any obvious filling  defect.  We felt that given the small size of the stone and the fact that  the patient had stated that he had been straining his urine quite  faithfully, that it would be important to directly look at the distal two-  thirds of the ureter.  A guidewire was placed.  Without the need for  dilation, we ran a 6 [place] rigid short ureteroscope up the distal two-  thirds of the ureter.  We  saw a little bit of edema and erythema in the most  distal aspect of the ureter, consistent with a previous stone, although we  could not  rule out some trauma from the retrograde as well as the wire.  I saw no  evidence of a stone.  A repeat retrograde was performed and, again, no  evidence of hydronephrosis or other significant abnormalities were  appreciated.  We felt that most likely, the patient had passed the stone  somewhere along the way spontaneously.                                               Valetta Fuller, M.D.    DSG/MEDQ  D:  07/17/2003  T:  07/17/2003  Job:  [id number]

## 2011-04-22 NOTE — Assessment & Plan Note (Signed)
[facility]                           PRIMARY CARE OFFICE NOTE   NAME:[name] [name], [name] [name]                      MRN:          [id number]  DATE:01/29/2007                            DOB:          Nov 21, 1931    [name] [name] is a 77 year old Caucasian gentleman who presents as a new  patient.  He is a Paediatric nurse Gold patient and is no longer followed by  [employer].   CHIEF COMPLAINT:  The patient reports he had an episode of dysphagia  this past summer where food got stuck in his esophagus and relieved by  emesis.  The patient had a recurrent episode 2 weeks ago, again having  to have emesis for relief.  The patient reports he is having increasing  problems and continues to have problems today.   The patient reports his CBGs have been running elevated in the 200  range.  I do have recent laboratories from January 15, 2007, with a  hemoglobin A1c of 7.6%.   PAST MEDICAL HISTORY:   SURGICAL:  1. Appendectomy in 1947.  2. Supraclavicular benign tumor excised in 1949.  3. Left elbow fracture in 1976 requiring multiple surgeries for      operative repair and fixation.  4. Kidney stone retrieval with ureteral stent x2.  5. 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   MEDICAL:  1. Usual childhood diseases.  2. Arthritis of the hands.  3. Hemorrhoids.  4. Bladder cancer.  5. Non-insulin-dependent diabetes.  6. Diverticulosis.  7. Headaches running once a week, relieved with Tylenol.  8. Constipation relieved with glucolax.  9. Glaucoma.  10.Atrial fibrillation.  11.Kidney stones x4 with 2 operative interventions.  12.Colon polyps x2.  13.Hypothyroid disease.  14.TIA with hospital stay in the 1990s.  15.Irritable bladder with urinary urgency as well as BPH.   PHYSICIAN ROSTER:  1. Valetta Fuller, M.D. for urology.  2. [name], MD for cardiology.   The patient was formerly followed by Dr. Garnette Scheuermann  and Dr. Boyd Kerbs  for GI, but [facility] has divested themselves of [employer] [facility] patients.   CURRENT MEDICATIONS:  1. Coumadin as directed.  2. Digitek 0.25 mg q.a.m.  3. Doxazosin 4 mg daily.  4. Glipizide 5 mg daily.  5. Lovastatin 20 mg daily.  6. Metformin 100 mg b.i.d.  7. Metoprolol 20 mg daily.  8. Sanctura 20 mg b.i.d.  9. Synthroid 100 mcg daily.  10.Potassium citrate b.i.d.  11.Stool softener as needed.  12.MiraLax as needed.   FAMILY HISTORY:  Father died at age 53 of a CVA.  Mother died at age 79  of lung cancer, one sister with colon cancer.  One uncle died of  prostate cancer.  There is no diabetes in the family.   SOCIAL HISTORY:  The patient is married 53 years.  He has three  daughters, ages 32, 21, and 39; one son age 12.  He has three  granddaughters, ages 11, 38, and 81.  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  He reports that his wife is in fair condition and health.  Their marriage is in good condition.   IMMUNIZATIONS:  Will need to check records from [place].   LAST COLONOSCOPY:  Will need to check records form [place].   REVIEW OF SYSTEMS:  Negative for any constitutional, ENT,  ophthalmologic, cardiovascular, respiratory complaints.  GI:  Per the  HPI and Chief Complaint.  No GU or musculoskeletal complaints except as  outlined in his medical problem list.   LIMITED PHYSICAL EXAMINATION:  VITAL SIGNS: Temperature 97.8, blood  pressure 113/68, pulse 84.  Weight 236.  GENERAL APPEARANCE:  This is a heavyset Caucasian male in no acute  distress.   No further exam conducted.   ASSESSMENT AND PLAN:  1. Cardiovascular:  The patient is followed for atrial fibrillation.      He has met and established with Dr. Gala Romney who  has made some      mild changes in his medications.  2. Diabetes:  The patient is poorly controlled by his report and based      on hemoglobin A1c.  Discussed various options for treatment with      him.  He is amenable to basal insulin therapy.  Plan:  The patient      is to start on Lantus 20 units nightly and will do 3-day advanced      based on CBG target of 120 or less.  He can communicate with me by      e-mail for problems in interval.  Otherwise, I will see him in one      month.  He is instructed in the use of the SoloSTAR pen by Vickey Sages, CMA, and provided with the initial pen and needles.  3. Gastrointestinal:  Patient reporting significant problems with      solid food dysphagia which is becoming more of a problem.  Plan:      The patient is to see Dr. Wendall Papa on an urgent basis on Tuesday,      January 30, 2007, at 2:30 p.m.  The patient is aware of this      appointment.  4. Lipids: The patient's fasting lipid profile from January 15, 2007,      showed cholesterol 137, triglycerides 342, HDL 32.2, LDL 56.1.      Plan: The patient to continue on Lovastatin.  5 . Genitourinary: The patient is followed on a regular basis by Dr.  Isabel Caprice for bladder cancer and evidently has done well.  He underwent  cystoscopy with bladder tumor resection on several occasions.  This is a  patient with probable irritable bladder.  He is on Sanctura with some  relief of his urgency symptoms.  He is on doxazosin for I assume benign  prostatic hypertrophy, and he will continue both of these medications.  1. Health maintenance: The patient reports he is current with      colonoscopy.  The patient's immunization schedule will be checked      from [place].   SUMMARY:  This is a very pleasant gentleman who will return in 4 weeks  for followup.  He will see Dr. Wendall Papa on February 26 as scheduled.  Rosalyn Gess Norins, MD  Electronically Signed   MEN/MedQ  DD: 01/29/2007   DT: 01/29/2007  Job #: [id number]   cc:   Dr. Wendall Papa STAT

## 2011-04-22 NOTE — Assessment & Plan Note (Signed)
HEALTHCARE                             PULMONARY OFFICE NOTE   NAME:[name] [name], [name] [name]                      MRN:          [id number]  DATE:02/08/2007                            DOB:          08-15-1931    PULMONARY/SLEEP MEDICINE CONSULTATION:   PROBLEM:  Sleep medicine consultation on kind referral from Dr.  Gala Romney, questioning obstructive sleep apnea.   HISTORY:  This 77 year old gentleman is changing doctors for insurance  coverage reasons.  Dr. Gala Romney questioned obstructive sleep apnea  while working with the patient's cardiac issues and noticing a history  of frequent nocturia.  There are no family complaints of snoring.  He  sleeps in a recliner because of hip discomfort.  If quiet, he will take  naps, but is aware that he startles easily if there is any extra noise.  Usual bed time is around 9 p.m., estimating 5 minute sleep latency.  He  wakes briefly 6-8 times during the night, but usually falls quickly back  to sleep before finally getting up between 6:30 and 7 a.m.   MEDICATION:  1. Coumadin.  2. Digitek 0.25 mg.  3. Doxazosin 4 mg b.i.d.  4. Glipizide 5 mg.  5. Metformin 1000 mg b.i.d.  6. Sanctura 20 mg b.i.d.  7. Synthroid 100 mcg.  8. Potassium.  9. Lovastatin 20 mg.  10.Metoprolol 20 mg  11.Occasional Tylenol p.m.  12.Recent Levaquin for bladder infection (Dr. Isabel Caprice).   ALLERGIES:  Drug intolerant to PENICILLIN.   REVIEW OF SYSTEMS:  Snoring.  Daytime sleepiness if quiet, and sometimes  sleepiness if driving.  Daytime naps.  May doze off watching TV or  reading.  He denies nasal congestion, shortness of breath, cough, wheeze  or chest pain.   PAST HISTORY:  1. Atrial fibrillation.  2. Diabetes type 2.  3. High cholesterol.  4. Cerebrovascular accident.  5. Colon surgery for polyps.  6. Chronic headaches.  7. Kidney stones.  8. Appendectomy.  9. ENT surgery to repair a nasal fracture.  10.Hypothyroidism.   No history of lung disease or anemia.  Note that he had dieted off 20  pounds in the last 2 years.   SOCIAL HISTORY:  Quit smoking in 1987.  Married with children.  Jimmye Norman.   FAMILY HISTORY:  Cancer.  Nobody with sleep apnea.   OBJECTIVE:  VITAL SIGNS:  Weight 241 pounds.  Blood pressure 140/72,  pulse regular 82.  Room air saturation 97%.  He has a long palate, 3/4.  Voice quality is normal.  There is no  stridor or thyromegaly.  No neck vein distention.  Nasal airway is not  obstructed.  SKIN:  Skin complexion seems a little plethoric.  No adenopathy.  CHEST:  Quiet and clear.  HEART:  Heart sounds are regular today.  No murmur, gallop or extra  beats noted.  ABDOMEN:  No enlargement of liver or spleen.  EXTREMITIES:  No cyanosis or edema.   IMPRESSION:  1. Question obstructive sleep apnea from history, noting that hip pain      has some impact on sleep quality, and he  has to sleep in a      recliner.  2. Atrial fibrillation.  3. Diabetes.   We discussed sleep disordered breathing in detail, including its impact  on daytime alertness, driving safety, and his cardiovascular system.  I  explained available evaluation including sleep studies.  He was  reluctant to schedule a study.  He accepted a booklet on sleep apnea and  is going to review it with his wife and contact us if he decides to  proceed with formal sleep evaluation.  I have emphasized the benefits of  keeping his weight down, and his responsibility to be alert while  driving.  At this time, he will return p.r.n.     [name], MD, [name], FACP  Electronically Signed    CDY/MedQ  DD: 02/11/2007  DT: 02/11/2007  Job #: [id number]   cc:   [name], MD

## 2011-04-22 NOTE — Assessment & Plan Note (Signed)
[facility]                            CARDIOLOGY OFFICE NOTE   NAME:[name] [name], [name] [name]                      MRN:          [id number]  DATE:01/10/2007                            DOB:          02-Aug-1931    PRIMARY CARE PHYSICIAN:  Rosalyn Gess. Norins, M.D.   PATIENT IDENTIFICATION:  [name] [name] is a delightful 77 year old male  previously followed by Dr. Patsi Sears for his atrial fibrillation, who  presents to establish long-term care.   HISTORY OF PRESENT ILLNESS:  [name] [name] has a long history of atrial  fibrillation dating back to the mid 1980s.  He was followed by Dr.  Patsi Sears.  He denies any history of known coronary artery disease.  He  has had 2 stress tests in the past, most recently in the fall of 2007.  He was admitted for chest pain.  He was told he might have had a small  myocardial infarction, but then he underwent a nuclear stress test,  which was negative, and he reportedly did not hear anything more about  it.  He has never had a cardiac catheterization.  Since that time, he  has been doing well.  He continues to work on the farm.  He has not had  any chest pain.  He does have some mild shortness of breath with  vigorous activity.  No orthopnea, no PND, no lower extremity edema, no  palpitations, no syncope.  He denies any history of hypertension.  He  does have a history of diabetes.   REVIEW OF SYSTEMS:  Notable for significant bladder and bowel problems  with significant nocturia and BPH symptoms, as well as colon polyps and  bladder polyps which have been resected.  He also notes daytime fatigue,  as well as a history of snoring, which he says is somewhat better.  He  denies any apnea.  He does have headaches, which relates to his  constipation.  Also notable for arthritis and hiatal hernia and reflux  disease.  The remainder of the review of systems is negative, except for  HPI and problem list.   PROBLEM LIST:  1. Chronic  atrial fibrillation dating back to the 1980s.  2. History of chest pain in the fall of 2007.  Nuclear stress test      reportedly negative, and his records are pending.  3. Diabetes.  4. Borderline hyperlipidemia.  5. Hypothyroidism.  6. Bladder polyps.  7. Colon polyps.  8. Constipation.  9. Gastroesophageal reflux disease.   CURRENT MEDICATIONS:  1. Atenolol 100 mg daily.  2. Coumadin as directed.  3. Digoxin 0.25 a day.  4. Doxazosin 4 mg b.i.d.  5. Glipizide 5 b.i.d.  6. Metformin 1000 b.i.d.  7. Sanctura 20 b.i.d.  8. Synthroid 100 mcg daily.  9. Potassium.  10.Stool softener.   ALLERGIES:  PENICILLIN.   SOCIAL HISTORY:  He is retired.  He lives in [place].  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  He is married with  4 grown children.  He has a history  of tobacco, but quit in 1986.  No  alcohol.   FAMILY HISTORY:  Mother died at 27 due to cancer.  Father died at 47 due  to pneumonia.  His sister is alive and well.   PHYSICAL EXAMINATION:  GENERAL:  He is an elderly-appearing male in no  acute distress.  He ambulates around the clinic without any respiratory  difficulty.  VITAL SIGNS:  Blood pressure is 108/60, heart rate 95, weight is 235.  HEENT:  Sclerae are anicteric.  Extraocular movements intact.  There are  no xanthelasmas.  Mucous membranes are moist.  Oropharynx is clear.  NECK:  Supple.  No JVD.  Carotids are 2+ bilaterally without bruits.  There is no lymphadenopathy or thyromegaly.  CARDIAC:  Irregularly irregular with no murmurs, rubs or gallops.  LUNGS:  Clear.  ABDOMEN:  Obese, nontender, nondistended.  No appreciable  hepatosplenomegaly.  No bruits, no masses.  Good bowel sounds.  EXTREMITIES:  Warm with no clubbing, cyanosis, or edema.  No rashes.  NEUROLOGIC:  He is alert and oriented x3.  Cranial nerves II-XII are  intact.  He moves all 4 extremities without difficulty.  Affect is  bright.   ELECTROCARDIOGRAM:   EKG shows atrial fibrillation with a ventricular  response of 95.  No significant ST-T wave abnormalities.   ASSESSMENT AND PLAN:  1. Chronic atrial fibrillation.  This is stable.  His heart rate is      slightly elevated this morning, but not abnormally so.  Due to my      preference, I am going to switch him over from atenolol to Toprol      ER 100 mg a day.  We may need to titrate it up gently to get better      heart rate control.  2. Questionable history of coronary artery disease.  We are pending      his records to look at his last stress test.  3. Diabetes.  This will be managed by Dr. Debby Bud; however, I do think      as this is a vascular equivalent that he should be on a statin.      Will go ahead and put him on lovastatin 20 mg a day.  Will also      check his lipids and urinary microalbumin.  Should he have      significant amounts of microalbuminuria, he will obviously need an      ACE inhibitor at a low dose, as his blood pressure will tolerate.  4. Fatigue and headaches.  I suspect he may have a sleep apnea.  Will      send him to pulmonary for a possible sleep study.   Return to clinic in 3 months for a routine follow up.     [name], MD  Electronically Signed    DRB/MedQ  DD: 01/10/2007  DT: 01/10/2007  Job #: [id number]   cc:   Rosalyn Gess. Norins, MD

## 2011-04-22 NOTE — Op Note (Signed)
NAMENOLBERTO, [name] [name]                         ACCOUNT NO.:  [id number]   MEDICAL RECORD NO.:  [id number]                   PATIENT TYPE:  AMB   LOCATION:  [facility]                                 FACILITY:  [facility]   PHYSICIAN:  Valetta Fuller, M.D.               DATE OF BIRTH:  [birth year]   DATE OF PROCEDURE:  DATE OF DISCHARGE:                                 OPERATIVE REPORT   PREOPERATIVE DIAGNOSIS:  Left proximal ureteral stone, 6 mm.   POSTOPERATIVE DIAGNOSIS:  Left proximal ureteral stone, 6 mm.   PROCEDURE PERFORMED:  Cystoscopy, retrograde pyelography, and double-J stent  placement.   SURGEON:  Valetta Fuller, M.D.   ANESTHESIA:  General.   INDICATIONS:  Mr. Chevez is a 77 year old male with known nephrolithiasis in  the past.  He presented as an emergency work-in today with excruciating left  flank pain initially.  He was made more comfortable in the office with  Toradol and Phenergan.  A CT showed a 6 mm stone in the proximal ureter with  obstruction.  There were additional 4-5 stones measuring 3-4 mm in the left  renal pelvis and a whole cluster of stones in the lower pole of his left  kidney.  He is on Coumadin.  We talked about several options.  We felt this  stone had some chance of passing spontaneously, although the patient was  told that it was probably less than 50/50 and may take up to several weeks  to pass.  He elected to proceed with double-J stent placement to temporize  his situation and unobstruct his kidney.  We will then plan on either  considering lithotripsy or possible ureteroscopy at a later date to try to  handle all this stone burden.   TECHNIQUE/FINDINGS:  Patient was brought to the operating room, where he had  successful induction of general endotracheal anesthesia.  He was prepped and  draped in the usual manner.  Cystoscopy revealed a relatively unremarkable  prostatic urethra and bladder.  Retrograde pyelogram confirmed a filling  defect  with some high-grade obstruction in the proximal ureter.  There was  also a cluster of stones in what appeared potentially a calyceal  diverticulum in the lower pole of the left kidney.  A guidewire was placed  to the renal pelvis, and a stent was placed with direct visual as well as  fluoroscopic control.  A 6 French 24 cm stent was chosen without a string.  The patient appeared to tolerate the procedure well, and there were no  obvious complications.  He was brought to the recovery room in stable  condition.                                               Valetta Fuller, M.D.  DSG/MEDQ  D:  07/28/2004  T:  07/28/2004  Job:  [id number]

## 2011-04-26 ENCOUNTER — Encounter (HOSPITAL_COMMUNITY): Payer: Medicare Other

## 2011-04-28 ENCOUNTER — Other Ambulatory Visit: Payer: Self-pay | Admitting: Internal Medicine

## 2011-04-28 ENCOUNTER — Encounter (HOSPITAL_COMMUNITY): Payer: Medicare Other

## 2011-04-28 LAB — GLUCOSE, CAPILLARY
Glucose-Capillary: 130 mg/dL — ABNORMAL HIGH (ref 70–99)
Glucose-Capillary: 133 mg/dL — ABNORMAL HIGH (ref 70–99)

## 2011-04-29 ENCOUNTER — Encounter: Payer: Self-pay | Admitting: Internal Medicine

## 2011-04-29 ENCOUNTER — Ambulatory Visit (INDEPENDENT_AMBULATORY_CARE_PROVIDER_SITE_OTHER): Payer: Medicare Other | Admitting: Internal Medicine

## 2011-04-29 VITALS — BP 130/70 | HR 85 | Resp 16 | Ht 72.0 in | Wt 247.0 lb

## 2011-04-29 DIAGNOSIS — I4891 Unspecified atrial fibrillation: Secondary | ICD-10-CM

## 2011-04-29 DIAGNOSIS — I50812 Chronic right heart failure: Secondary | ICD-10-CM | POA: Insufficient documentation

## 2011-04-29 DIAGNOSIS — I509 Heart failure, unspecified: Secondary | ICD-10-CM

## 2011-04-29 NOTE — Assessment & Plan Note (Signed)
Tolerating well. Rate controlled. Continue with Engage study for anti-coagulation.

## 2011-04-29 NOTE — Patient Instructions (Signed)
Continue current medications Follow up in 4 months with Dr Clarise Cruz

## 2011-04-29 NOTE — Assessment & Plan Note (Signed)
He has chronic RHF with minimal PAH on cath. Suspect this is due mostly to OSA. Doing well with pulmonary rehab. Edema well controlled. Not candidate for pulmonary vasodilators at this point.

## 2011-04-29 NOTE — Progress Notes (Signed)
HPI:  [name] [name] is a very pleasant 77 year old male with a history of obesity, chronic atrial fibrillation (in Engage trial), diabetes, hyperlipidemia, obstructive sleep apnea who is intolerant of his CPAP and R-sided HF.  Last echo done in 10/11: EF 60-65% with mild LVH.  Right ventricle: The cavity size was moderately dilated. Systolic function was mildly reduced. Right atrium: The atrium was severely dilated. Pulmonary arteries: PA systolic pressure 55-59 mmHg.  He did have overnight pulse ox which demonstrated nocturnal desats and he has been set up with O2 at night.  He still cannot wear CPAP.  Cath 01/14/11:   Normal coronary arteries. Right atrial pressure mean of 11, RV pressure 42/12 with EDP of 10, PA pressure 45/19 with a mean of 28.  Pulmonary capillary wedge pressure mean of 16.  Central aortic pressure 121/60 with a mean of 92.  LV pressure 119/19 with an LVEDP of 19.  Femoral artery saturation was 91%.  Pulmonary artery saturation was 59% and 63%.  Fick cardiac output was 5.5.  Cardiac index 2.4.  Pulmonary vascular resistance was 2.2 Woods units.  On simultaneous RV and LV tracings, there was no evidence of ventricular interdependence.       Doing well. Going to pulmonary rehab 2x/week. Sats 96-97% on RA during rehab. Continues with mild edema but very manageable. Stable exertional dyspnea. No syncope/presyncope. No bleeding with anti-coagulation.   ROS: All systems negative except as listed in HPI, PMH and Problem List.  Past Medical History  Diagnosis Date  . Family history of malignant neoplasm of gastrointestinal tract   . Unspecified constipation   . Abnormal involuntary movements   . Vitamin B12 deficiency   . Type II or unspecified type diabetes mellitus with neurological manifestations, not stated as uncontrolled   . Claudication   . Hypertrophy of prostate with urinary obstruction and other lower urinary tract symptoms (LUTS)   . Unspecified sleep apnea     NPSG  05/14/2007- AHI 80.7/ hr  . Obesity   . Mixed hyperlipidemia   . Atrial fibrillation     --myoview 07/2006: not gated. No ischemia or infarct  Diastolic HF-- Echo 02/2008 ER60% mild AI/MR. RV mild to moderately dilated/ dysfunction. ? restrictive CM . RVSP 36  . Diabetes mellitus type 1, uncontrolled   . Bladder polyps     Current Outpatient Prescriptions  Medication Sig Dispense Refill  . acetaminophen (TYLENOL) 500 MG tablet Take 500 mg by mouth every 6 (six) hours as needed.        . ACTOS 30 MG tablet TAKE 1 TABLET DAILY  90 tablet  1  . diazepam (VALIUM) 2 MG tablet Take 2 mg by mouth daily. One tablet once a day       . digoxin (LANOXIN) 0.25 MG tablet Take 250 mcg by mouth daily.        . furosemide (LASIX) 20 MG tablet TAKE 1 TABLET DAILY  90 tablet  1  . insulin glargine (LANTUS SOLOSTAR) 100 UNIT/ML injection Inject 70 Units into the skin at bedtime.        . Insulin Pen Needle (PEN NEEDLES 31GX5/16") 31G X 8 MM MISC by Does not apply route as directed.        . Levothyroxine Sodium 100 MCG CAPS Take by mouth daily.        [name] lovastatin (MEVACOR) 20 MG tablet Take 20 mg by mouth daily.        . metFORMIN (GLUCOPHAGE) 1000 MG tablet Take 1,000  mg by mouth 2 (two) times daily with a meal.        . metoprolol (TOPROL-XL) 100 MG 24 hr tablet Take 100 mg by mouth daily.        . NON FORMULARY ENGAGE STUDY       . polyethylene glycol (MIRALAX) powder Take 17 g by mouth as needed.        . potassium citrate (UROCIT-K) 10 MEQ (1080 MG) SR tablet Take 10 mEq by mouth 2 (two) times daily with a meal.        . Tamsulosin HCl (FLOMAX) 0.4 MG CAPS Take by mouth daily after supper.        . trospium (SANCTURA) 20 MG tablet Take 20 mg by mouth 2 (two) times daily.           PHYSICAL EXAM: Filed Vitals:   04/29/11 1106  BP: 130/70  Pulse: 85  Resp: 16   General:  Somewhat chronically ill appearing. Walks slowly with cane. HEENT: normal except for perio-orbital cyanosis and  telengectasias Neck: supple. + JVD. Carotids to jaw 2+ bilat; no bruits. No lymphadenopathy or thryomegaly appreciated. Cor: PMI nonpalpale. Irregular rate. P2 not overly prominent. Lungs: clear Abdomen: obese soft, nontender, nondistended. Good bowel sounds. Extremities: no cyanosis, clubbing, extensive stasis dermatitis with trace chronic edema from calves down bilaterally Neuro: alert & orientedx3, cranial nerves grossly intact. moves all 4 extremities w/o difficulty. affect pleasant    ECG: AFib with RBBB 85 bpm   ASSESSMENT & PLAN:

## 2011-05-03 ENCOUNTER — Encounter (HOSPITAL_COMMUNITY): Payer: Medicare Other

## 2011-05-05 ENCOUNTER — Encounter (HOSPITAL_COMMUNITY): Payer: Medicare Other

## 2011-05-10 ENCOUNTER — Encounter (HOSPITAL_COMMUNITY): Payer: Medicare Other | Attending: Internal Medicine

## 2011-05-10 ENCOUNTER — Encounter (HOSPITAL_COMMUNITY): Payer: Medicare Other

## 2011-05-10 ENCOUNTER — Other Ambulatory Visit: Payer: Self-pay | Admitting: Internal Medicine

## 2011-05-10 DIAGNOSIS — E669 Obesity, unspecified: Secondary | ICD-10-CM | POA: Insufficient documentation

## 2011-05-10 DIAGNOSIS — E785 Hyperlipidemia, unspecified: Secondary | ICD-10-CM | POA: Insufficient documentation

## 2011-05-10 DIAGNOSIS — R002 Palpitations: Secondary | ICD-10-CM | POA: Insufficient documentation

## 2011-05-10 DIAGNOSIS — E039 Hypothyroidism, unspecified: Secondary | ICD-10-CM | POA: Insufficient documentation

## 2011-05-10 DIAGNOSIS — I4891 Unspecified atrial fibrillation: Secondary | ICD-10-CM | POA: Insufficient documentation

## 2011-05-10 DIAGNOSIS — Z5189 Encounter for other specified aftercare: Secondary | ICD-10-CM | POA: Insufficient documentation

## 2011-05-10 DIAGNOSIS — I2789 Other specified pulmonary heart diseases: Secondary | ICD-10-CM | POA: Insufficient documentation

## 2011-05-10 DIAGNOSIS — G4733 Obstructive sleep apnea (adult) (pediatric): Secondary | ICD-10-CM | POA: Insufficient documentation

## 2011-05-10 DIAGNOSIS — E119 Type 2 diabetes mellitus without complications: Secondary | ICD-10-CM | POA: Insufficient documentation

## 2011-05-12 ENCOUNTER — Encounter (HOSPITAL_COMMUNITY): Payer: Medicare Other

## 2011-05-13 ENCOUNTER — Encounter: Payer: Self-pay | Admitting: Internal Medicine

## 2011-05-16 ENCOUNTER — Ambulatory Visit (INDEPENDENT_AMBULATORY_CARE_PROVIDER_SITE_OTHER): Payer: Medicare Other | Admitting: Internal Medicine

## 2011-05-16 ENCOUNTER — Encounter: Payer: Self-pay | Admitting: Internal Medicine

## 2011-05-16 VITALS — BP 108/70 | HR 92 | Ht 72.0 in | Wt 247.6 lb

## 2011-05-16 DIAGNOSIS — G4730 Sleep apnea, unspecified: Secondary | ICD-10-CM

## 2011-05-16 NOTE — Assessment & Plan Note (Signed)
We will continue to try to make CPAP work for him. He is trying. He may eventually need an anlternative. We need [facility] to help with mask fit or I will send him to the sleep techs for CPAP desensitization.

## 2011-05-16 NOTE — Patient Instructions (Signed)
An occasional cup of coffee or other caffeine drink might help alertness when you need it. As an alternative, you might consider a caffeine tablet- one brand is NoDoz, but every drug store has them, equivalent to a cup of coffee   I suggest you take your CPAP mask to [facility], where they have their various mask models to show you. Explain to them what you don't like about your present mask and see if they can do better.   Consider a gentle moisturizing lotion on your face, like Eucerin, to reduce itching

## 2011-05-16 NOTE — Progress Notes (Signed)
  Subjective:    Patient ID: [name] [name], male    DOB: December 14, 1930, 77 y.o.   MRN: [id number]  HPI  04/06/11- He was unable to continue CPAP. It starts out fine but when he gets up for bathroom at night, then comes back to bed, the mask always leaks and he is unable to tolerate. He is still having to get up 2-3 x / night for bathroom. He disconnects hose and keeps mask on. He thinks the mask warms  and softens with body heat. Problems center around his need to get up frequently, but he denies any cardiac events since last here.   05/16/11- OSA  Doesn't feel much daytime sleepiness, still takes naps and enjoys them. We discussed reasons for continuing CPAP. Mask either leaks or is too tight at times. [name] makes face itch. Discussed alternatives.  Review of Systems  See HPI Constitutional:   No weight loss, night sweats,  Fevers, chills, fatigue, lassitude. HEENT:   No headaches,  Difficulty swallowing,  Tooth/dental problems,  Sore throat,                No sneezing, itching, ear ache, nasal congestion, post nasal drip,   CV:  No chest pain,  Orthopnea, PND, swelling in lower extremities, anasarca, dizziness, palpitations  GI  No heartburn, indigestion, abdominal pain, nausea, vomiting, diarrhea, change in bowel habits, loss of appetite  Resp: No shortness of breath with exertion or at rest.  No excess mucus, no productive cough,  No non-productive cough,  No coughing up of blood.  No change in color of mucus.  No wheezing.   Skin: no rash or lesions.  GU: no dysuria, change in color of urine, no urgency or frequency.  No flank pain.  MS:  No joint pain or swelling.  No decreased range of motion.  No back pain.  Psych:  No change in mood or affect. No depression or anxiety.  No memory loss.      Objective:   Physical Exam  General- Alert, Oriented, Affect-appropriate, Distress- none acute. Depressd affect but very sharp  Skin- rash-none, lesions- none, excoriation-  none  Lymphadenopathy- none  Head- atraumatic  Eyes- Gross vision intact, PERRLA, conjunctivae clear secretions  Ears-  Normal-Hearing, canals, Tm   Nose- Clear,  No- Septal dev, mucus, polyps, erosion, perforation   Throat- Mallampati II , mucosa clear , drainage- none, tonsils- atrophic. Lips and tongue dry   Neck- flexible , trachea midline, no stridor , thyroid nl, carotid no bruit  Chest - symmetrical excursion , unlabored     Heart/CV- RRR , no murmur , no gallop  , no rub, nl s1 s2                     - JVD- none , edema- none, stasis changes- none, varices- none     Lung- clear to P&A, wheeze- none, cough- none , dullness-none, rub- none     Chest wall- Abd- tender-no, distended-no, bowel sounds-present, HSM- no  Br/ Gen/ Rectal- Not done, not indicated  Extrem- cyanosis- none, clubbing, none, atrophy- none, strength- nl  Neuro- grossly intact to observation         Assessment & Plan:

## 2011-05-17 ENCOUNTER — Encounter (HOSPITAL_COMMUNITY): Payer: Medicare Other

## 2011-05-18 ENCOUNTER — Ambulatory Visit: Payer: Medicare Other | Admitting: Internal Medicine

## 2011-05-19 ENCOUNTER — Encounter (HOSPITAL_COMMUNITY): Payer: Medicare Other

## 2011-05-21 ENCOUNTER — Encounter: Payer: Self-pay | Admitting: Internal Medicine

## 2011-05-24 ENCOUNTER — Encounter (HOSPITAL_COMMUNITY): Payer: Medicare Other

## 2011-05-26 ENCOUNTER — Encounter (HOSPITAL_COMMUNITY): Payer: Medicare Other

## 2011-05-31 ENCOUNTER — Encounter (HOSPITAL_COMMUNITY): Payer: Medicare Other

## 2011-06-02 ENCOUNTER — Encounter (HOSPITAL_COMMUNITY): Payer: Medicare Other

## 2011-06-02 ENCOUNTER — Encounter: Payer: Self-pay | Admitting: Internal Medicine

## 2011-06-07 ENCOUNTER — Encounter (HOSPITAL_COMMUNITY): Payer: Medicare Other

## 2011-06-09 ENCOUNTER — Encounter (HOSPITAL_COMMUNITY): Payer: Medicare Other

## 2011-06-09 ENCOUNTER — Encounter (HOSPITAL_COMMUNITY): Payer: Medicare Other | Attending: Internal Medicine

## 2011-06-09 DIAGNOSIS — I4891 Unspecified atrial fibrillation: Secondary | ICD-10-CM | POA: Insufficient documentation

## 2011-06-09 DIAGNOSIS — E785 Hyperlipidemia, unspecified: Secondary | ICD-10-CM | POA: Insufficient documentation

## 2011-06-09 DIAGNOSIS — R002 Palpitations: Secondary | ICD-10-CM | POA: Insufficient documentation

## 2011-06-09 DIAGNOSIS — E119 Type 2 diabetes mellitus without complications: Secondary | ICD-10-CM | POA: Insufficient documentation

## 2011-06-09 DIAGNOSIS — Z5189 Encounter for other specified aftercare: Secondary | ICD-10-CM | POA: Insufficient documentation

## 2011-06-09 DIAGNOSIS — G4733 Obstructive sleep apnea (adult) (pediatric): Secondary | ICD-10-CM | POA: Insufficient documentation

## 2011-06-09 DIAGNOSIS — I2789 Other specified pulmonary heart diseases: Secondary | ICD-10-CM | POA: Insufficient documentation

## 2011-06-09 DIAGNOSIS — E669 Obesity, unspecified: Secondary | ICD-10-CM | POA: Insufficient documentation

## 2011-06-09 DIAGNOSIS — E039 Hypothyroidism, unspecified: Secondary | ICD-10-CM | POA: Insufficient documentation

## 2011-06-14 ENCOUNTER — Encounter (HOSPITAL_COMMUNITY): Payer: Medicare Other

## 2011-06-16 ENCOUNTER — Encounter (HOSPITAL_COMMUNITY): Payer: Medicare Other

## 2011-06-21 ENCOUNTER — Encounter (HOSPITAL_COMMUNITY): Payer: Medicare Other

## 2011-06-23 ENCOUNTER — Encounter (HOSPITAL_COMMUNITY): Payer: Medicare Other

## 2011-06-28 ENCOUNTER — Encounter (HOSPITAL_COMMUNITY): Payer: Medicare Other

## 2011-06-29 ENCOUNTER — Other Ambulatory Visit: Payer: Self-pay | Admitting: Internal Medicine

## 2011-06-30 ENCOUNTER — Encounter (HOSPITAL_COMMUNITY): Payer: Medicare Other

## 2011-07-05 ENCOUNTER — Encounter (INDEPENDENT_AMBULATORY_CARE_PROVIDER_SITE_OTHER): Payer: Medicare Other

## 2011-07-05 ENCOUNTER — Encounter (HOSPITAL_COMMUNITY): Payer: Medicare Other

## 2011-07-05 DIAGNOSIS — R0989 Other specified symptoms and signs involving the circulatory and respiratory systems: Secondary | ICD-10-CM

## 2011-07-07 ENCOUNTER — Encounter (HOSPITAL_COMMUNITY): Payer: Medicare Other

## 2011-07-12 ENCOUNTER — Encounter (HOSPITAL_COMMUNITY): Payer: Medicare Other

## 2011-07-14 ENCOUNTER — Encounter (HOSPITAL_COMMUNITY): Payer: Medicare Other

## 2011-07-19 ENCOUNTER — Encounter (HOSPITAL_COMMUNITY): Payer: Medicare Other

## 2011-07-21 ENCOUNTER — Encounter (HOSPITAL_COMMUNITY): Payer: Medicare Other

## 2011-07-25 ENCOUNTER — Encounter: Payer: Self-pay | Admitting: Internal Medicine

## 2011-07-25 ENCOUNTER — Ambulatory Visit (INDEPENDENT_AMBULATORY_CARE_PROVIDER_SITE_OTHER): Payer: Medicare Other | Admitting: Internal Medicine

## 2011-07-25 DIAGNOSIS — I509 Heart failure, unspecified: Secondary | ICD-10-CM

## 2011-07-25 DIAGNOSIS — I4891 Unspecified atrial fibrillation: Secondary | ICD-10-CM

## 2011-07-25 DIAGNOSIS — I50812 Chronic right heart failure: Secondary | ICD-10-CM

## 2011-07-25 NOTE — Progress Notes (Signed)
HPI:  [name] [name] is a very pleasant 77 year old male with a history of obesity, chronic atrial fibrillation (in Engage trial), diabetes, hyperlipidemia, obstructive sleep apnea who is intolerant of his CPAP and R-sided HF.  Last echo done in 10/11: EF 60-65% with mild LVH.  Right ventricle: The cavity size was moderately dilated. Systolic function was mildly reduced. Right atrium: The atrium was severely dilated. Pulmonary arteries: PA systolic pressure 55-59 mmHg.  He did have overnight pulse ox which demonstrated nocturnal desats and he has been set up with O2 at night.  He still cannot wear CPAP.  Cath 01/14/11:   Normal coronary arteries. Right atrial pressure mean of 11, RV pressure 42/12 with EDP of 10, PA pressure 45/19 with a mean of 28.  Pulmonary capillary wedge pressure mean of 16.  Central aortic pressure 121/60 with a mean of 92.  LV pressure 119/19 with an LVEDP of 19.  Femoral artery saturation was 91%.  Pulmonary artery saturation was 59% and 63%.  Fick cardiac output was 5.5.  Cardiac index 2.4.  Pulmonary vascular resistance was 2.2 Woods units.  On simultaneous RV and LV tracings, there was no evidence of ventricular interdependence.       Doing well. Finished pulmonary rehab in July 2012. Not exercising on his own now. Continues with mild edema but very manageable. Stable exertional dyspnea. No syncope/presyncope. No bleeding with anti-coagulation (in Engage trial). Weight very stable. Refuses to wear compression hose as he feels they hurt his legs.    ROS: All systems negative except as listed in HPI, PMH and Problem List.  Past Medical History  Diagnosis Date  . Family history of malignant neoplasm of gastrointestinal tract   . Unspecified constipation   . Abnormal involuntary movements   . Vitamin B12 deficiency   . Type II or unspecified type diabetes mellitus with neurological manifestations, not stated as uncontrolled   . Claudication   . Hypertrophy of prostate with  urinary obstruction and other lower urinary tract symptoms (LUTS)   . Unspecified sleep apnea     NPSG 05/14/2007- AHI 80.7/ hr  . Obesity   . Mixed hyperlipidemia   . Atrial fibrillation     --myoview 07/2006: not gated. No ischemia or infarct  Diastolic HF-- Echo 02/2008 ER60% mild AI/MR. RV mild to moderately dilated/ dysfunction. ? restrictive CM . RVSP 36  . Diabetes mellitus type 1, uncontrolled   . Bladder polyps     Current Outpatient Prescriptions  Medication Sig Dispense Refill  . acetaminophen (TYLENOL) 500 MG tablet Take 500 mg by mouth every 6 (six) hours as needed.        . ACTOS 30 MG tablet TAKE 1 TABLET DAILY  90 tablet  1  . digoxin (LANOXIN) 0.25 MG tablet TAKE 1 TABLET DAILY  90 tablet  1  . furosemide (LASIX) 20 MG tablet TAKE 1 TABLET DAILY  90 tablet  1  . insulin glargine (LANTUS SOLOSTAR) 100 UNIT/ML injection Inject 70 Units into the skin at bedtime.        . Insulin Pen Needle (PEN NEEDLES 31GX5/16") 31G X 8 MM MISC by Does not apply route as directed.        . Levothyroxine Sodium 100 MCG CAPS Take by mouth daily.        [name] lovastatin (MEVACOR) 20 MG tablet Take 20 mg by mouth daily.        . metFORMIN (GLUCOPHAGE) 1000 MG tablet TAKE 1 TABLET TWICE A DAY  180  tablet  2  . metoprolol (TOPROL-XL) 100 MG 24 hr tablet Take 100 mg by mouth daily.        . NON FORMULARY ENGAGE STUDY       . polyethylene glycol (MIRALAX) powder Take 17 g by mouth as needed.        . potassium citrate (UROCIT-K) 10 MEQ (1080 MG) SR tablet Take 10 mEq by mouth 2 (two) times daily with a meal.        . Tamsulosin HCl (FLOMAX) 0.4 MG CAPS Take by mouth daily after supper.        . trospium (SANCTURA) 20 MG tablet Take 20 mg by mouth 2 (two) times daily.           PHYSICAL EXAM: Filed Vitals:   07/25/11 1126  BP: 124/68  Pulse: 75   General:  Somewhat chronically ill appearing. Walks slowly with cane. HEENT: normal except for perio-orbital cyanosis and telengectasias Neck:  supple. + JVD. Carotids to jaw 2+ bilat; no bruits. No lymphadenopathy or thryomegaly appreciated. Cor: PMI nonpalpale. Irregular rate. P2 not overly prominent. Lungs: clear Abdomen: obese soft, nontender, nondistended. Good bowel sounds. Extremities: no cyanosis, clubbing, extensive stasis dermatitis with trace chronic edema from calves down bilaterally Neuro: alert & orientedx3, cranial nerves grossly intact. moves all 4 extremities w/o difficulty. affect pleasant   ECG: AFib with RBBB 75 bpm   ASSESSMENT & PLAN:

## 2011-07-25 NOTE — Assessment & Plan Note (Signed)
He has chronic RHF with minimal PAH on cath. Suspect this is due mostly to OSA.  Edema well controlled. Continue current regimen.

## 2011-07-25 NOTE — Assessment & Plan Note (Signed)
Tolerating well. Rate controlled. Continue with Engage study for anti-coagulation.

## 2011-07-27 ENCOUNTER — Other Ambulatory Visit: Payer: Self-pay | Admitting: Dermatology

## 2011-08-03 ENCOUNTER — Other Ambulatory Visit: Payer: Self-pay | Admitting: Internal Medicine

## 2011-08-10 ENCOUNTER — Other Ambulatory Visit: Payer: Self-pay | Admitting: Internal Medicine

## 2011-08-26 ENCOUNTER — Encounter: Payer: Self-pay | Admitting: Internal Medicine

## 2011-09-05 ENCOUNTER — Other Ambulatory Visit: Payer: Self-pay | Admitting: *Deleted

## 2011-09-05 MED ORDER — PIOGLITAZONE HCL 30 MG PO TABS: 30.0000 mg | ORAL_TABLET | Freq: Every day | ORAL | Status: DC

## 2011-09-13 ENCOUNTER — Other Ambulatory Visit: Payer: Self-pay | Admitting: Internal Medicine

## 2011-09-20 ENCOUNTER — Encounter: Payer: Self-pay | Admitting: Internal Medicine

## 2011-10-12 ENCOUNTER — Other Ambulatory Visit: Payer: Self-pay | Admitting: Internal Medicine

## 2011-10-21 ENCOUNTER — Ambulatory Visit (INDEPENDENT_AMBULATORY_CARE_PROVIDER_SITE_OTHER): Payer: Medicare Other

## 2011-10-21 DIAGNOSIS — Z23 Encounter for immunization: Secondary | ICD-10-CM

## 2011-11-09 ENCOUNTER — Other Ambulatory Visit: Payer: Self-pay | Admitting: Internal Medicine

## 2011-11-15 ENCOUNTER — Encounter: Payer: Self-pay | Admitting: Internal Medicine

## 2011-11-15 ENCOUNTER — Ambulatory Visit (INDEPENDENT_AMBULATORY_CARE_PROVIDER_SITE_OTHER)
Admission: RE | Admit: 2011-11-15 | Discharge: 2011-11-15 | Disposition: A | Discharge status: Home / Self Care | Payer: Medicare Other | Source: Ambulatory Visit | Attending: Internal Medicine | Admitting: Internal Medicine

## 2011-11-15 ENCOUNTER — Ambulatory Visit (INDEPENDENT_AMBULATORY_CARE_PROVIDER_SITE_OTHER): Payer: Medicare Other | Admitting: Internal Medicine

## 2011-11-15 VITALS — BP 138/72 | HR 93 | Ht 72.0 in | Wt 244.6 lb

## 2011-11-15 DIAGNOSIS — G4733 Obstructive sleep apnea (adult) (pediatric): Secondary | ICD-10-CM

## 2011-11-15 DIAGNOSIS — G4730 Sleep apnea, unspecified: Secondary | ICD-10-CM

## 2011-11-15 DIAGNOSIS — J449 Chronic obstructive pulmonary disease, unspecified: Secondary | ICD-10-CM

## 2011-11-15 DIAGNOSIS — I2789 Other specified pulmonary heart diseases: Secondary | ICD-10-CM

## 2011-11-15 DIAGNOSIS — J4489 Other specified chronic obstructive pulmonary disease: Secondary | ICD-10-CM

## 2011-11-15 NOTE — Progress Notes (Signed)
Patient ID: [name] [name], male    DOB: 1931/03/11, 77 y.o.   MRN: [id number]  HPI  04/06/11- He was unable to continue CPAP. It starts out fine but when he gets up for bathroom at night, then comes back to bed, the mask always leaks and he is unable to tolerate. He is still having to get up 2-3 x / night for bathroom. He disconnects hose and keeps mask on. He thinks the mask warms  and softens with body heat. Problems center around his need to get up frequently, but he denies any cardiac events since last here.   05/16/11- OSA  Doesn't feel much daytime sleepiness, still takes naps and enjoys them. We discussed reasons for continuing CPAP. Mask either leaks or is too tight at times. [name] makes face itch. Discussed alternatives.  11/15/11- 46 yoM former smoker followed for OSA  Has had flu vaccine. Had a hypoglycemic episode earlier today but says he is back in control today, having eaten. Denies cough or wheeze but notices some exertional dyspnea, little changed. He has given up on CPAP and feels he sleeps well. He still wakes 3 or 4 times a night for nocturia and this was part of the problem with CPAP. He takes an afternoon nap most days.  Review of Systems See HPI Constitutional:   No-   weight loss, night sweats, fevers, chills, fatigue, lassitude. HEENT:   No-  headaches, difficulty swallowing, tooth/dental problems, sore throat,       No-  sneezing, itching, ear ache, nasal congestion, post nasal drip,  CV:  No-   chest pain, orthopnea, PND, swelling in lower extremities, anasarca,                                  dizziness, palpitations Resp: +  shortness of breath with exertion , not at rest.              No-   productive cough,  No non-productive cough,  No- coughing up of blood.              No-   change in color of mucus.  No- wheezing.   Skin: No-   rash or lesions. GI:  No-   heartburn, indigestion, abdominal pain, nausea, vomiting, diarrhea,                 change in bowel habits,  loss of appetite GU: No-   dysuria, change in color of urine, no urgency or frequency.  No- flank pain. MS:  No-   joint pain or swelling.  No- decreased range of motion.  No- back pain. Neuro-     nothing unusual Psych:  No- change in mood or affect. No depression or anxiety.  No memory loss.      Objective:   Physical Exam General- Alert, Oriented, Affect-appropriate and responsive, Distress- none acute Skin- rash-none, lesions- none, excoriation- none Lymphadenopathy- none Head- atraumatic            Eyes- Gross vision intact, PERRLA, conjunctivae clear secretions            Ears- Hearing, canals-normal            Nose- Clear, no-Septal dev, mucus, polyps, erosion, perforation             Throat- Mallampati II , mucosa very dry , drainage- none, tonsils- atrophic Neck- flexible , trachea midline,  no stridor , thyroid nl, carotid no bruit Chest - symmetrical excursion , unlabored           Heart/CV- irreg-AFib , no murmur , no gallop  , no rub, nl s1 s2                           - JVD- none , edema- none, stasis changes- none, varices- none           Lung- clear to P&A, wheeze- none, cough- none , dullness-none, rub- none           Chest wall-  Abd- tender-no, distended-no, bowel sounds-present, HSM- no Br/ Gen/ Rectal- Not done, not indicated Extrem- cyanosis- none, clubbing, none, atrophy- none, strength- nl Neuro- grossly intact to observation

## 2011-11-15 NOTE — Patient Instructions (Signed)
Order- [facility]- referral to Dr Althea Grimmer, orthodontist,   Consider oral appliance for dx OSA  Order- CXR- dx COPD

## 2011-11-17 ENCOUNTER — Telehealth: Payer: Self-pay | Admitting: Internal Medicine

## 2011-11-17 NOTE — Telephone Encounter (Signed)
Called and spoke with pt.  Informed him of cxr results.  Nothing further needed.   

## 2011-11-19 NOTE — Assessment & Plan Note (Signed)
He is a former smoker. He was willing to have a chest x-ray for documentation.

## 2011-11-19 NOTE — Assessment & Plan Note (Addendum)
He could not get comfortable using CPAP partly because he had to get up so often at night for nocturia. He feels he is doing well enough and is not interested in working on CPAP now. I did try to discuss the concerns of sleep apnea and atrial fibrillation. He is interested in learning more about oral appliances as an alternative therapy. We will refer him to Dr. Myrtis Ser.

## 2011-12-02 ENCOUNTER — Other Ambulatory Visit: Payer: Self-pay | Admitting: *Deleted

## 2011-12-02 MED ORDER — PIOGLITAZONE HCL 30 MG PO TABS: 30.0000 mg | ORAL_TABLET | Freq: Every day | ORAL | Status: DC

## 2011-12-07 ENCOUNTER — Encounter: Payer: Self-pay | Admitting: Internal Medicine

## 2011-12-16 ENCOUNTER — Other Ambulatory Visit: Payer: Self-pay

## 2011-12-16 MED ORDER — METFORMIN HCL 1000 MG PO TABS: 1000.0000 mg | ORAL_TABLET | Freq: Two times a day (BID) | ORAL | Status: DC

## 2011-12-16 MED ORDER — LEVOTHYROXINE SODIUM 100 MCG PO TABS: 100.0000 ug | ORAL_TABLET | Freq: Every day | ORAL | Status: DC

## 2011-12-16 MED ORDER — FUROSEMIDE 20 MG PO TABS: 20.0000 mg | ORAL_TABLET | Freq: Every day | ORAL | Status: DC

## 2011-12-26 ENCOUNTER — Other Ambulatory Visit: Payer: Self-pay

## 2011-12-26 ENCOUNTER — Encounter: Payer: Self-pay | Admitting: Gastroenterology

## 2011-12-26 MED ORDER — FUROSEMIDE 20 MG PO TABS: 20.0000 mg | ORAL_TABLET | Freq: Every day | ORAL | Status: DC

## 2011-12-26 MED ORDER — LEVOTHYROXINE SODIUM 100 MCG PO TABS: 100.0000 ug | ORAL_TABLET | Freq: Every day | ORAL | Status: DC

## 2011-12-26 MED ORDER — DIGOXIN 250 MCG PO TABS: 0.2500 mg | ORAL_TABLET | Freq: Every day | ORAL | Status: DC

## 2011-12-26 MED ORDER — METFORMIN HCL 1000 MG PO TABS: 1000.0000 mg | ORAL_TABLET | Freq: Two times a day (BID) | ORAL | Status: DC

## 2012-01-02 ENCOUNTER — Encounter: Payer: Self-pay | Admitting: Internal Medicine

## 2012-01-02 ENCOUNTER — Other Ambulatory Visit: Payer: Self-pay | Admitting: Pharmacist

## 2012-01-02 ENCOUNTER — Encounter (INDEPENDENT_AMBULATORY_CARE_PROVIDER_SITE_OTHER): Payer: Medicare Other

## 2012-01-02 ENCOUNTER — Ambulatory Visit (HOSPITAL_COMMUNITY)
Admission: RE | Admit: 2012-01-02 | Discharge: 2012-01-02 | Disposition: A | Discharge status: Home / Self Care | Payer: Medicare Other | Source: Ambulatory Visit | Attending: Internal Medicine | Admitting: Internal Medicine

## 2012-01-02 VITALS — BP 122/58 | HR 66 | Wt 250.8 lb

## 2012-01-02 DIAGNOSIS — I83009 Varicose veins of unspecified lower extremity with ulcer of unspecified site: Secondary | ICD-10-CM | POA: Insufficient documentation

## 2012-01-02 DIAGNOSIS — R0989 Other specified symptoms and signs involving the circulatory and respiratory systems: Secondary | ICD-10-CM

## 2012-01-02 DIAGNOSIS — I509 Heart failure, unspecified: Secondary | ICD-10-CM | POA: Insufficient documentation

## 2012-01-02 DIAGNOSIS — I50812 Chronic right heart failure: Secondary | ICD-10-CM

## 2012-01-02 DIAGNOSIS — L97909 Non-pressure chronic ulcer of unspecified part of unspecified lower leg with unspecified severity: Secondary | ICD-10-CM | POA: Insufficient documentation

## 2012-01-02 DIAGNOSIS — I4891 Unspecified atrial fibrillation: Secondary | ICD-10-CM | POA: Insufficient documentation

## 2012-01-02 MED ORDER — WARFARIN SODIUM 5 MG PO TABS: 5.0000 mg | ORAL_TABLET | Freq: Every day | ORAL | Status: DC

## 2012-01-02 NOTE — Assessment & Plan Note (Signed)
Doing well. Volume status fairly well controlled. Continue current therapy.

## 2012-01-02 NOTE — Assessment & Plan Note (Signed)
He was seen by Tonye Becket (former wound care NP) in the office today and recommended Jobe compression stocking which he has now agreed to wear. Can f/u with his foot doctor in 1-2 weeks or if getting worse.

## 2012-01-02 NOTE — Patient Instructions (Signed)
We will contact you in 4 months. months to schedule your next appointment.

## 2012-01-02 NOTE — Progress Notes (Signed)
Patient ID: [name] [name], male   DOB: 31-Jan-1931, 78 y.o.   MRN: [id number] HPI:  [name] [name] is a very pleasant 78 year old male with a history of obesity, chronic atrial fibrillation (in Engage trial), diabetes, hyperlipidemia, obstructive sleep apnea who is intolerant of his CPAP and R-sided HF.  Last echo done in 10/11: EF 60-65% with mild LVH.  Right ventricle: The cavity size was moderately dilated. Systolic function was mildly reduced. Right atrium: The atrium was severely dilated. Pulmonary arteries: PA systolic pressure 55-59 mmHg.  He did have overnight pulse ox which demonstrated nocturnal desats and he has been set up with O2 at night.  He still cannot wear CPAP.  Cath 01/14/11:   Normal coronary arteries. Right atrial pressure mean of 11, RV pressure 42/12 with EDP of 10, PA pressure 45/19 with a mean of 28.  Pulmonary capillary wedge pressure mean of 16.  Central aortic pressure 121/60 with a mean of 92.  LV pressure 119/19 with an LVEDP of 19.  Femoral artery saturation was 91%.  Pulmonary artery saturation was 59% and 63%.  Fick cardiac output was 5.5.  Cardiac index 2.4.  Pulmonary vascular resistance was 2.2 Woods units.  On simultaneous RV and LV tracings, there was no evidence of ventricular interdependence.       Doing well. Finished pulmonary rehab in July 2012. Not exercising on his own now but does walk [place] 2-3x/week which is about 1 mile round trip.. Edema is well controlled. Stable exertional dyspnea. No syncope/presyncope. No bleeding with anti-coagulation (in Engage trial).  Weight had gone down to 244 but now back to 250. Refuses to wear compression hose as he feels they hurt his legs.  Having problems with episodes with hypoglycemia. Sleeps in recliner due to hi pain. Puts a styrofoam pad under legs to elevate them.  Had sore open up on left ankle this am.   ROS: All systems negative except as listed in HPI, PMH and Problem List.  Past Medical History  Diagnosis  Date  . Family history of malignant neoplasm of gastrointestinal tract   . Unspecified constipation   . Abnormal involuntary movements   . Vitamin B12 deficiency   . Type II or unspecified type diabetes mellitus with neurological manifestations, not stated as uncontrolled   . Claudication   . Hypertrophy of prostate with urinary obstruction and other lower urinary tract symptoms (LUTS)   . Unspecified sleep apnea     NPSG 05/14/2007- AHI 80.7/ hr  . Obesity   . Mixed hyperlipidemia   . Atrial fibrillation     --myoview 07/2006: not gated. No ischemia or infarct  Diastolic HF-- Echo 02/2008 ER60% mild AI/MR. RV mild to moderately dilated/ dysfunction. ? restrictive CM . RVSP 36  . Diabetes mellitus type 1, uncontrolled   . Bladder polyps     Current Outpatient Prescriptions  Medication Sig Dispense Refill  . acetaminophen (TYLENOL) 500 MG tablet Take 500 mg by mouth every 6 (six) hours as needed.        . diazepam (VALIUM) 2 MG tablet Take 2 mg by mouth as needed.        . digoxin (LANOXIN) 0.25 MG tablet Take 1 tablet (0.25 mg total) by mouth daily.  90 tablet  0  . furosemide (LASIX) 20 MG tablet Take 1 tablet (20 mg total) by mouth daily.  90 tablet  0  . insulin glargine (LANTUS SOLOSTAR) 100 UNIT/ML injection Inject 70 Units into the skin at bedtime.  7 mL  2  . Insulin Pen Needle (PEN NEEDLES 31GX5/16") 31G X 8 MM MISC by Does not apply route as directed.        [name] levothyroxine (SYNTHROID, LEVOTHROID) 100 MCG tablet Take 1 tablet (100 mcg total) by mouth daily.  90 tablet  0  . lovastatin (MEVACOR) 20 MG tablet TAKE 1 TABLET DAILY  90 tablet  3  . metFORMIN (GLUCOPHAGE) 1000 MG tablet Take 1 tablet (1,000 mg total) by mouth 2 (two) times daily with a meal.  180 tablet  0  . metoprolol (TOPROL-XL) 100 MG 24 hr tablet Take 100 mg by mouth daily.        . NON FORMULARY ENGAGE STUDY       . pioglitazone (ACTOS) 30 MG tablet Take 1 tablet (30 mg total) by mouth daily.  90 tablet  2  .  polyethylene glycol (MIRALAX) powder Take 17 g by mouth as needed.        . potassium citrate (UROCIT-K) 10 MEQ (1080 MG) SR tablet Take 10 mEq by mouth 2 (two) times daily with a meal.        . Tamsulosin HCl (FLOMAX) 0.4 MG CAPS Take by mouth daily after supper.        . trospium (SANCTURA) 20 MG tablet Take 20 mg by mouth 2 (two) times daily.           PHYSICAL EXAM: Filed Vitals:   01/02/12 1039  BP: 122/58  Pulse: 66   General:  Somewhat chronically ill appearing. Walks slowly with cane. HEENT: normal except for perio-orbital cyanosis and telengectasias Neck: supple. + JVD. Carotids to jaw 2+ bilat; no bruits. No lymphadenopathy or thryomegaly appreciated. Cor: PMI nonpalpale. Irregular rate. P2 not overly prominent. 2/6 TR Lungs: clear Abdomen: obese soft, nontender, nondistended. Good bowel sounds. Extremities: no cyanosis, clubbing, extensive stasis dermatitis with 1+ chronic edema from calves down bilaterally. Small skin tear on LLE. DPs 1+  Neuro: alert & orientedx3, cranial nerves grossly intact. moves all 4 extremities w/o difficulty. affect pleasant   ASSESSMENT & PLAN:

## 2012-01-02 NOTE — Assessment & Plan Note (Signed)
Stable. Doing well with rate control. He is finishing up Engage trial. He has done well on warfarin in the past and we will resume that.

## 2012-01-06 ENCOUNTER — Other Ambulatory Visit: Payer: Self-pay | Admitting: *Deleted

## 2012-01-06 MED ORDER — FUROSEMIDE 20 MG PO TABS: 20.0000 mg | ORAL_TABLET | Freq: Every day | ORAL | Status: DC

## 2012-01-06 NOTE — Telephone Encounter (Signed)
[facility] called on behalf of pt. Pt wants rx for Furosemide sent to pharmacy.

## 2012-01-12 ENCOUNTER — Ambulatory Visit: Payer: Medicare Other

## 2012-01-12 DIAGNOSIS — Z006 Encounter for examination for normal comparison and control in clinical research program: Secondary | ICD-10-CM

## 2012-01-12 DIAGNOSIS — I4891 Unspecified atrial fibrillation: Secondary | ICD-10-CM

## 2012-01-12 NOTE — Progress Notes (Addendum)
I saw patient on 01/12/12 for INR check. Patient is transitioning out of Engage Study. INR was 4.7 Patient had taken medication today. I consulted with Brett Fairy in coumadin clinic about INR. We are going to hold Coumadin on 01/13/12 and 01/14/12. Patient is to take 2.5 mg on evening of 01/15/12. I will recheck INR on 01/16/12. Patient is to call me with new medication started by Dr. Isabel Caprice. I asked patient to be very careful over the week-end and report to our office if any bleeding issues. Patient verbalized understanding.

## 2012-01-16 ENCOUNTER — Telehealth (HOSPITAL_COMMUNITY): Payer: Self-pay | Admitting: *Deleted

## 2012-01-16 ENCOUNTER — Ambulatory Visit: Payer: Medicare Other | Admitting: *Deleted

## 2012-01-16 ENCOUNTER — Other Ambulatory Visit: Payer: Self-pay

## 2012-01-16 ENCOUNTER — Encounter (INDEPENDENT_AMBULATORY_CARE_PROVIDER_SITE_OTHER): Payer: Medicare Other

## 2012-01-16 ENCOUNTER — Telehealth: Payer: Self-pay | Admitting: Internal Medicine

## 2012-01-16 ENCOUNTER — Ambulatory Visit (HOSPITAL_COMMUNITY)
Admission: RE | Admit: 2012-01-16 | Discharge: 2012-01-16 | Disposition: A | Discharge status: Home / Self Care | Payer: Medicare Other | Source: Ambulatory Visit | Attending: Internal Medicine | Admitting: Internal Medicine

## 2012-01-16 DIAGNOSIS — I4891 Unspecified atrial fibrillation: Secondary | ICD-10-CM

## 2012-01-16 DIAGNOSIS — R079 Chest pain, unspecified: Secondary | ICD-10-CM | POA: Insufficient documentation

## 2012-01-16 LAB — PROTIME-INR
INR: 2.3 ratio — ABNORMAL HIGH (ref 0.8–1.0)
Prothrombin Time: 25.8 s — ABNORMAL HIGH (ref 10.2–12.4)

## 2012-01-16 MED ORDER — PANTOPRAZOLE SODIUM 40 MG PO TBEC: 40.0000 mg | DELAYED_RELEASE_TABLET | Freq: Every day | ORAL | Status: DC

## 2012-01-16 NOTE — Telephone Encounter (Signed)
Pt in for research study and needs INR

## 2012-01-16 NOTE — Telephone Encounter (Signed)
Pt seen in clinic today.  

## 2012-01-16 NOTE — Assessment & Plan Note (Addendum)
[name] [name] had normal coronaries 1 year ago.  He presents with intermittent chest pain over the last 24 hours.  He has never had this pain before and it is not aggravated by ambulation.  At this time will plan for [facility] to evaluate chest pain.  If increased risk for ischemia will proceed with left heart catheterization.  Will also add protonix for possible GI etiology while on coumadin.  If chest pain becomes persistent or worse he is to go to the ER, he and his wife voice understanding.      Patient seen and examined with Ulyess Blossom PA-C. We discussed all aspects of the encounter. I agree with the assessment and plan as stated above.  Overall< I feel CP is atypical but given RFs will proceed with Myoview to further evaluate. Add Protonix.

## 2012-01-16 NOTE — Patient Instructions (Signed)
Your physician has requested that you have a lexiscan myoview. For further information please visit [email or web address]. Please follow instruction sheet, as given.   

## 2012-01-16 NOTE — Progress Notes (Signed)
HPI:  [name] [name] is a very pleasant 78 year old male with a history of obesity, chronic atrial fibrillation (in Engage trial), diabetes, hyperlipidemia, obstructive sleep apnea who is intolerant of his CPAP and R-sided HF.  Last echo done in 10/11: EF 60-65% with mild LVH.  Right ventricle: The cavity size was moderately dilated. Systolic function was mildly reduced. Right atrium: The atrium was severely dilated. Pulmonary arteries: PA systolic pressure 55-59 mmHg.  He did have overnight pulse ox which demonstrated nocturnal desats and he has been set up with O2 at night.  He still cannot wear CPAP.  Cath 01/14/11:   Normal coronary arteries. Right atrial pressure mean of 11, RV pressure 42/12 with EDP of 10, PA pressure 45/19 with a mean of 28.  Pulmonary capillary wedge pressure mean of 16.  Central aortic pressure 121/60 with a mean of 92.  LV pressure 119/19 with an LVEDP of 19.  Femoral artery saturation was 91%.  Pulmonary artery saturation was 59% and 63%.  Fick cardiac output was 5.5.  Cardiac index 2.4.  Pulmonary vascular resistance was 2.2 Woods units.  On simultaneous RV and LV tracings, there was no evidence of ventricular interdependence.       He is here for a work-in visit today for chest pain.  This is pain he has not had previously.  He was awoken 4-6 times last night with left sided chest pain lasting 20-40 minutes and resolving on its own.  Pain was 4/10.  The pain was a squeezing/burning sensation.  No associated diaphoresis, N/V.  He was seen by the research department this morning and was told POC INR was >10, but the machine was not working properly so the lab was sent off.  This worried the patient and he had several episodes of pain today.  He was worried about the pain and stopped by the office.  Lab draw INR 2.3.  Currently no chest pain. EKG with no acute changes.     ROS: All systems negative except as listed in HPI, PMH and Problem List.  Past Medical History  Diagnosis  Date  . Family history of malignant neoplasm of gastrointestinal tract   . Unspecified constipation   . Abnormal involuntary movements   . Vitamin B12 deficiency   . Type II or unspecified type diabetes mellitus with neurological manifestations, not stated as uncontrolled   . Claudication   . Hypertrophy of prostate with urinary obstruction and other lower urinary tract symptoms (LUTS)   . Unspecified sleep apnea     NPSG 05/14/2007- AHI 80.7/ hr  . Obesity   . Mixed hyperlipidemia   . Atrial fibrillation     --myoview 07/2006: not gated. No ischemia or infarct  Diastolic HF-- Echo 02/2008 ER60% mild AI/MR. RV mild to moderately dilated/ dysfunction. ? restrictive CM . RVSP 36  . Diabetes mellitus type 1, uncontrolled   . Bladder polyps     Current Outpatient Prescriptions  Medication Sig Dispense Refill  . acetaminophen (TYLENOL) 500 MG tablet Take 500 mg by mouth every 6 (six) hours as needed.        . diazepam (VALIUM) 2 MG tablet Take 2 mg by mouth as needed.        . digoxin (LANOXIN) 0.25 MG tablet Take 1 tablet (0.25 mg total) by mouth daily.  90 tablet  0  . furosemide (LASIX) 20 MG tablet Take 1 tablet (20 mg total) by mouth daily.  90 tablet  0  . insulin glargine (LANTUS  SOLOSTAR) 100 UNIT/ML injection Inject 70 Units into the skin at bedtime.  7 mL  2  . Insulin Pen Needle (PEN NEEDLES 31GX5/16") 31G X 8 MM MISC by Does not apply route as directed.        [name] levothyroxine (SYNTHROID, LEVOTHROID) 100 MCG tablet Take 1 tablet (100 mcg total) by mouth daily.  90 tablet  0  . lovastatin (MEVACOR) 20 MG tablet TAKE 1 TABLET DAILY  90 tablet  3  . metFORMIN (GLUCOPHAGE) 1000 MG tablet Take 1 tablet (1,000 mg total) by mouth 2 (two) times daily with a meal.  180 tablet  0  . metoprolol (TOPROL-XL) 100 MG 24 hr tablet Take 100 mg by mouth daily.        . NON FORMULARY ENGAGE STUDY       . pioglitazone (ACTOS) 30 MG tablet Take 1 tablet (30 mg total) by mouth daily.  90 tablet  2  .  polyethylene glycol (MIRALAX) powder Take 17 g by mouth as needed.        . potassium citrate (UROCIT-K) 10 MEQ (1080 MG) SR tablet Take 10 mEq by mouth 2 (two) times daily with a meal.        . Tamsulosin HCl (FLOMAX) 0.4 MG CAPS Take by mouth daily after supper.        . trospium (SANCTURA) 20 MG tablet Take 20 mg by mouth 2 (two) times daily.        [name] warfarin (COUMADIN) 5 MG tablet Take 1 tablet (5 mg total) by mouth daily.  30 tablet  3     PHYSICAL EXAM: Filed Vitals:   01/16/12 1615  BP: 134/70  Pulse: 68   General:  Somewhat chronically ill appearing. Walks slowly with cane. HEENT: normal except for perio-orbital cyanosis and telengectasias Neck: supple. JVD none. Carotids 2+ bilat; no bruits. No lymphadenopathy or thryomegaly appreciated. Cor: PMI nonpalpale. Irregular rate. P2 not overly prominent. 2/6 TR Lungs: clear Abdomen: obese soft, nontender, nondistended. Good bowel sounds. Extremities: no cyanosis, clubbing, extensive stasis dermatitis with 1+ chronic edema from calves down bilaterally. Small skin tear on LLE. DPs 1+  Neuro: alert & orientedx3, cranial nerves grossly intact. moves all 4 extremities w/o difficulty. affect pleasant  EKG: Afib at 70 bpm with RBBB    ASSESSMENT & PLAN:

## 2012-01-16 NOTE — Telephone Encounter (Signed)
New Problem   [name] called with chest pains during the night & Coumadin level shot up to 10.  Please return call to [name] at hm# [phone] ASAP ([name] [name]), No SOB or dizziness

## 2012-01-17 ENCOUNTER — Ambulatory Visit (HOSPITAL_COMMUNITY): Payer: Medicare Other

## 2012-01-30 ENCOUNTER — Ambulatory Visit (HOSPITAL_COMMUNITY): Payer: Medicare Other | Attending: Cardiology | Admitting: Radiology

## 2012-01-30 ENCOUNTER — Ambulatory Visit (INDEPENDENT_AMBULATORY_CARE_PROVIDER_SITE_OTHER): Payer: Medicare Other

## 2012-01-30 DIAGNOSIS — E785 Hyperlipidemia, unspecified: Secondary | ICD-10-CM | POA: Insufficient documentation

## 2012-01-30 DIAGNOSIS — I4891 Unspecified atrial fibrillation: Secondary | ICD-10-CM

## 2012-01-30 DIAGNOSIS — R079 Chest pain, unspecified: Secondary | ICD-10-CM | POA: Insufficient documentation

## 2012-01-30 DIAGNOSIS — R0609 Other forms of dyspnea: Secondary | ICD-10-CM | POA: Insufficient documentation

## 2012-01-30 DIAGNOSIS — I4510 Unspecified right bundle-branch block: Secondary | ICD-10-CM

## 2012-01-30 DIAGNOSIS — E119 Type 2 diabetes mellitus without complications: Secondary | ICD-10-CM | POA: Insufficient documentation

## 2012-01-30 DIAGNOSIS — R0989 Other specified symptoms and signs involving the circulatory and respiratory systems: Secondary | ICD-10-CM | POA: Insufficient documentation

## 2012-01-30 DIAGNOSIS — Z87891 Personal history of nicotine dependence: Secondary | ICD-10-CM | POA: Insufficient documentation

## 2012-01-30 DIAGNOSIS — I739 Peripheral vascular disease, unspecified: Secondary | ICD-10-CM | POA: Insufficient documentation

## 2012-01-30 DIAGNOSIS — R0602 Shortness of breath: Secondary | ICD-10-CM | POA: Insufficient documentation

## 2012-01-30 DIAGNOSIS — Z794 Long term (current) use of insulin: Secondary | ICD-10-CM | POA: Insufficient documentation

## 2012-01-30 DIAGNOSIS — Z7901 Long term (current) use of anticoagulants: Secondary | ICD-10-CM | POA: Insufficient documentation

## 2012-01-30 DIAGNOSIS — R002 Palpitations: Secondary | ICD-10-CM | POA: Insufficient documentation

## 2012-01-30 MED ORDER — TECHNETIUM TC 99M TETROFOSMIN IV KIT
33.0000 | PACK | Freq: Once | INTRAVENOUS | Status: AC | PRN
  Administered 2012-01-30: 33 via INTRAVENOUS

## 2012-01-30 MED ORDER — TECHNETIUM TC 99M TETROFOSMIN IV KIT
10.4000 | PACK | Freq: Once | INTRAVENOUS | Status: AC | PRN
  Administered 2012-01-30: 10 via INTRAVENOUS

## 2012-01-30 MED ORDER — REGADENOSON 0.4 MG/5ML IV SOLN
0.4000 mg | Freq: Once | INTRAVENOUS | Status: AC
  Administered 2012-01-30: 0.4 mg via INTRAVENOUS

## 2012-01-30 NOTE — Progress Notes (Signed)
[facility] SITE 3 NUCLEAR MED [address] [place] [address] [phone]  Cardiology Nuclear Med [name] [name] is a 78 y.o. male [id number] Sep 14, 1931   Nuclear Med Background Indication for Stress Test:  Evaluation for Ischemia History: 10/11'Echo-60-65%mild LVH/MR R-atrium severly dilated, 11"Heart Catheterization R&L Nml/no EF,07' Myocardial Perfusion Study not gated EF/no isch and A-fib,CHF Cardiac Risk Factors: Claudication, History of Smoking, IDDM Type 2 and Lipids  Symptoms:  Chest Pain, DOE, Palpitations and SOB   Nuclear Pre-Procedure Caffeine/Decaff Intake:  None NPO After: 8:30pm solids/8oz milk at 0745 for low blood sugar  Lungs:  clear IV 0.9% NS with Angio Cath:  20g  IV Site: L Wrist  IV Started by:  Cathlyn Parsons, RN  Chest Size (in):  44 Cup Size: n/a  Height: 6' (1.829 m)  Weight:  252 lb (114.306 kg)  BMI:  Body mass index is 34.18 kg/(m^2). Tech Comments:  Metoprolol held x 24 hrs. CBG at 0645 was 77;no diabetic meds taken    Nuclear Med Study 1 or 2 day study: 1 day  Stress Test Type:  Eugenie Birks  Reading MD: Cassell Clement, MD  Order Authorizing Provider:  Arvilla Meres  Resting Radionuclide: Technetium 2m Tetrofosmin  Resting Radionuclide Dose: 10.4 mCi   Stress Radionuclide:  Technetium 49m Tetrofosmin  Stress Radionuclide Dose: 33.0 mCi           Stress Protocol Rest HR: 64 Stress HR: 74  Rest BP: 119/58 Stress BP: 109/59  Exercise Time (min): n/a METS: n/a   Predicted Max HR: 142 bpm % Max HR: 52.11 bpm Rate Pressure Product: 8066   Dose of Adenosine (mg):  n/a Dose of Lexiscan: 0.4 mg  Dose of Atropine (mg): n/a Dose of Dobutamine: n/a mcg/kg/min (at max HR)  Stress Test Technologist: Frederick Peers, EMT-P  Nuclear Technologist:  Doyne Keel, CNMT     Rest Procedure:  Myocardial perfusion imaging was performed at rest 45 minutes following the intravenous administration of Technetium 88m  Tetrofosmin. Rest ECG: Afib-RBBB  Stress Procedure:  The patient received IV Lexiscan 0.4 mg over 15-seconds.  Technetium 73m Tetrofosmin injected at 30-seconds.  There were no significant changes with Lexiscan.  Quantitative spect images were obtained after a 45 minute delay. Stress ECG: No significant change from baseline ECG  QPS Raw Data Images:  Normal; no motion artifact; normal heart/lung ratio. Stress Images:  Normal homogeneous uptake in all areas of the myocardium. Rest Images:  Normal homogeneous uptake in all areas of the myocardium. Subtraction (SDS):  No evidence of ischemia. Transient Ischemic Dilatation (Normal <1.22): 0.94 Lung/Heart Ratio (Normal <0.45):  0.31  Quantitative Gated Spect Images QGS EDV:  131 ml QGS ESV:  44 ml QGS cine images:  NL LV Function; NL Wall Motion QGS EF: 66%  Impression Exercise Capacity:  Adenosine study with no exercise. BP Response:  Hypotensive blood pressure response. Clinical Symptoms:  Chest heaviness ECG Impression:  No significant ST segment change suggestive of ischemia. Comparison with Prior Nuclear Study: No images available.  Overall Impression:  Low risk stress nuclear study.  No ischemia. Normal LV systolic function.  EF 66%   Cassell Clement

## 2012-02-02 ENCOUNTER — Encounter: Payer: Self-pay | Admitting: Internal Medicine

## 2012-02-05 ENCOUNTER — Encounter (HOSPITAL_COMMUNITY): Payer: Self-pay

## 2012-02-05 ENCOUNTER — Emergency Department (HOSPITAL_COMMUNITY): Payer: Medicare Other

## 2012-02-05 ENCOUNTER — Emergency Department (HOSPITAL_COMMUNITY)
Admission: EM | Admit: 2012-02-05 | Discharge: 2012-02-05 | Disposition: A | Discharge status: Home / Self Care | Payer: Medicare Other | Attending: Emergency Medicine | Admitting: Emergency Medicine

## 2012-02-05 DIAGNOSIS — R040 Epistaxis: Secondary | ICD-10-CM | POA: Insufficient documentation

## 2012-02-05 DIAGNOSIS — J3489 Other specified disorders of nose and nasal sinuses: Secondary | ICD-10-CM | POA: Insufficient documentation

## 2012-02-05 DIAGNOSIS — R5381 Other malaise: Secondary | ICD-10-CM | POA: Insufficient documentation

## 2012-02-05 DIAGNOSIS — I498 Other specified cardiac arrhythmias: Secondary | ICD-10-CM | POA: Insufficient documentation

## 2012-02-05 DIAGNOSIS — E119 Type 2 diabetes mellitus without complications: Secondary | ICD-10-CM | POA: Insufficient documentation

## 2012-02-05 DIAGNOSIS — R059 Cough, unspecified: Secondary | ICD-10-CM | POA: Insufficient documentation

## 2012-02-05 DIAGNOSIS — E782 Mixed hyperlipidemia: Secondary | ICD-10-CM | POA: Insufficient documentation

## 2012-02-05 DIAGNOSIS — Z794 Long term (current) use of insulin: Secondary | ICD-10-CM | POA: Insufficient documentation

## 2012-02-05 DIAGNOSIS — I4891 Unspecified atrial fibrillation: Secondary | ICD-10-CM | POA: Insufficient documentation

## 2012-02-05 DIAGNOSIS — Z79899 Other long term (current) drug therapy: Secondary | ICD-10-CM | POA: Insufficient documentation

## 2012-02-05 DIAGNOSIS — Z7901 Long term (current) use of anticoagulants: Secondary | ICD-10-CM | POA: Insufficient documentation

## 2012-02-05 DIAGNOSIS — R05 Cough: Secondary | ICD-10-CM | POA: Insufficient documentation

## 2012-02-05 LAB — CBC
HCT: 40.7 % (ref 39.0–52.0)
Hemoglobin: 13.4 g/dL (ref 13.0–17.0)
MCV: 90.4 fL (ref 78.0–100.0)
RBC: 4.5 MIL/uL (ref 4.22–5.81)
WBC: 4.5 10*3/uL (ref 4.0–10.5)

## 2012-02-05 LAB — BASIC METABOLIC PANEL
CO2: 30 mEq/L (ref 19–32)
Chloride: 101 mEq/L (ref 96–112)
Creatinine, Ser: 1.47 mg/dL — ABNORMAL HIGH (ref 0.50–1.35)
GFR calc Af Amer: 51 mL/min — ABNORMAL LOW (ref >90)
Potassium: 4.3 mEq/L (ref 3.5–5.1)

## 2012-02-05 LAB — GLUCOSE, CAPILLARY: Glucose-Capillary: 150 mg/dL — ABNORMAL HIGH (ref 70–99)

## 2012-02-05 LAB — PRO B NATRIURETIC PEPTIDE: Pro B Natriuretic peptide (BNP): 620.6 pg/mL — ABNORMAL HIGH (ref 0–450)

## 2012-02-05 LAB — PROTIME-INR: INR: 2.06 — ABNORMAL HIGH (ref 0.00–1.49)

## 2012-02-05 MED ORDER — SULFAMETHOXAZOLE-TMP DS 800-160 MG PO TABS
1.0000 | ORAL_TABLET | Freq: Once | ORAL | Status: AC
  Administered 2012-02-05: 1 via ORAL
  Filled 2012-02-05: qty 1

## 2012-02-05 MED ORDER — SULFAMETHOXAZOLE-TRIMETHOPRIM 800-160 MG PO TABS: 1.0000 | ORAL_TABLET | Freq: Two times a day (BID) | ORAL | Status: AC

## 2012-02-05 MED ORDER — PHENYLEPHRINE HCL 0.5 % NA SOLN
1.0000 [drp] | Freq: Once | NASAL | Status: AC
  Administered 2012-02-05: 1 [drp] via NASAL
  Filled 2012-02-05: qty 15

## 2012-02-05 NOTE — ED Notes (Signed)
Upon transferr to wheelchair- pt very weak needed moderate assistance x 2- Olivia NT assisted with the transfer back to acute room 9

## 2012-02-05 NOTE — ED Notes (Signed)
Pt presents with controlled nosebleed to the left nare.  HX of the same,  Pt denies injury,chest pain or shortness of breath.  C/o of chest congestion and cold since last week

## 2012-02-05 NOTE — ED Provider Notes (Signed)
History     CSN: [id number]  Arrival date & time 02/05/12  1513   First MD Initiated Contact with Patient 02/05/12 1531      Chief Complaint  Patient presents with  . Epistaxis  . Weakness     HPI The patient presents with epistaxis, fatigue, cough.  He notes that his symptoms began gradually one week ago.  Since onset he has had mild cough, mild congestion, generalized sense of fatigue.  These symptoms have been persistent, with no relief from OTC medications.  No fungal current dyspnea, chest pain, lightheadedness, nausea, vomiting, diarrhea, melena.  Today, approximately 2 hours ago, he developed epistaxis.  He believes the epistaxis was more on the right side than her left.  The bleeding stopped with packing of his nostril.  Due to the longevity of the bleeding, his Coumadin use, he presents for evaluation.  On arrival there is no active bleeding, he has complaints only of fatigue, cough, congestion or he denies any pain, any lightheadedness. Past Medical History  Diagnosis Date  . Family history of malignant neoplasm of gastrointestinal tract   . Unspecified constipation   . Abnormal involuntary movements   . Vitamin B12 deficiency   . Type II or unspecified type diabetes mellitus with neurological manifestations, not stated as uncontrolled   . Claudication   . Hypertrophy of prostate with urinary obstruction and other lower urinary tract symptoms (LUTS)   . Unspecified sleep apnea     NPSG 05/14/2007- AHI 80.7/ hr  . Obesity   . Mixed hyperlipidemia   . Atrial fibrillation     --myoview 07/2006: not gated. No ischemia or infarct  Diastolic HF-- Echo 02/2008 ER60% mild AI/MR. RV mild to moderately dilated/ dysfunction. ? restrictive CM . RVSP 36  . Diabetes mellitus type 1, uncontrolled   . Bladder polyps   . Diabetes mellitus   . Renal disorder     Past Surgical History  Procedure Date  . Appendectomy   . Kidney stone surgery     Kidney stone retrieval with uretal stent  x 2   . Bladder polyps   . Transurethral resection of bladder   . Orif elbow fracture     Family History  Problem Relation Age of Onset  . Lung cancer Mother 40    nonsmoker  . Colon cancer Sister   . Prostate cancer    . Stroke Father   . Hip fracture Father     History  Substance Use Topics  . Smoking status: Former Smoker -- 1.0 packs/day for 35 years    Types: Cigarettes    Quit date: 12/05/1985  . Smokeless tobacco: Not on file  . Alcohol Use: No      Review of Systems  Constitutional:       Per HPI, otherwise negative  HENT:       Per HPI, otherwise negative  Eyes: Negative.   Respiratory:       Per HPI, otherwise negative  Cardiovascular:       Per HPI, otherwise negative  Gastrointestinal: Negative for vomiting.  Genitourinary: Negative.   Musculoskeletal:       Per HPI, otherwise negative  Skin: Negative.   Neurological: Negative for syncope.    Allergies  Penicillins  Home Medications   Current Outpatient Rx  Name Route Sig Dispense Refill  . ACETAMINOPHEN 500 MG PO TABS Oral Take 500 mg by mouth every 6 (six) hours as needed.      [name] DIAZEPAM 2  MG PO TABS Oral Take 2 mg by mouth as needed.      [name] DIGOXIN 0.25 MG PO TABS Oral Take 1 tablet (0.25 mg total) by mouth daily. 90 tablet 0  . FUROSEMIDE 20 MG PO TABS Oral Take 1 tablet (20 mg total) by mouth daily. 90 tablet 0  . INSULIN GLARGINE 100 UNIT/ML [place] SOLN Subcutaneous Inject 70 Units into the skin at bedtime. 7 mL 2  . PEN NEEDLES 5/16" 31G X 8 MM MISC Does not apply by Does not apply route as directed.      [name] LEVOTHYROXINE SODIUM 100 MCG PO TABS Oral Take 1 tablet (100 mcg total) by mouth daily. 90 tablet 0  . LOVASTATIN 20 MG PO TABS  TAKE 1 TABLET DAILY 90 tablet 3  . METFORMIN HCL 1000 MG PO TABS Oral Take 1 tablet (1,000 mg total) by mouth 2 (two) times daily with a meal. 180 tablet 0  . METOPROLOL SUCCINATE ER 100 MG PO TB24 Oral Take 100 mg by mouth daily.      . NON FORMULARY  ENGAGE  STUDY     . PANTOPRAZOLE SODIUM 40 MG PO TBEC Oral Take 1 tablet (40 mg total) by mouth daily. 30 tablet 6  . PIOGLITAZONE HCL 30 MG PO TABS Oral Take 1 tablet (30 mg total) by mouth daily. 90 tablet 2  . POLYETHYLENE GLYCOL 3350 PO POWD Oral Take 17 g by mouth as needed.      [name] POTASSIUM CITRATE ER 10 MEQ (1080 MG) PO TBCR Oral Take 10 mEq by mouth 2 (two) times daily with a meal.      . TAMSULOSIN HCL 0.4 MG PO CAPS Oral Take by mouth daily after supper.      . TROSPIUM CHLORIDE 20 MG PO TABS Oral Take 20 mg by mouth 2 (two) times daily.      . WARFARIN SODIUM 5 MG PO TABS Oral Take 1 tablet (5 mg total) by mouth daily. 30 tablet 3    BP 143/60  Pulse 88  Temp(Src) 97.3 F (36.3 C) (Oral)  Resp 16  SpO2 96%  Physical Exam  Constitutional: He is oriented to person, place, and time. He appears well-developed and well-nourished. No distress.  HENT:  Head: Normocephalic and atraumatic.       Gauze packing in place in both nostrils, with trace amounts of blood in the the patient's nose.  Eyes: Conjunctivae and EOM are normal.  Cardiovascular: Normal rate.  An irregularly irregular rhythm present.  Pulmonary/Chest: Effort normal. No stridor. No respiratory distress.  Abdominal: He exhibits no distension.  Musculoskeletal: He exhibits no edema and no tenderness.  Neurological: He is alert and oriented to person, place, and time. No cranial nerve deficit. He exhibits normal muscle tone. Coordination normal.  Skin: Skin is warm and dry. He is not diaphoretic.  Psychiatric: He has a normal mood and affect.    ED Course  EPISTAXIS MANAGEMENT Date/Time: 02/05/2012 5:50 PM Performed by: Gerhard Munch Authorized by: Gerhard Munch Consent: Verbal consent not obtained. The procedure was performed in an emergent situation. Risks and benefits: risks, benefits and alternatives were discussed Consent given by: patient Patient identity confirmed: verbally with patient Time out: Immediately  prior to procedure a "time out" was called to verify the correct patient, procedure, equipment, support staff and site/side marked as required. Local anesthetic: phenylephrine. Patient sedated: no Treatment site: left Kiesselbach's area Repair method: nasal balloon and anterior pack Post-procedure assessment: bleeding stopped Treatment  complexity: simple Patient tolerance: Patient tolerated the procedure well with no immediate complications.   (including critical care time)  Labs Reviewed  GLUCOSE, CAPILLARY - Abnormal; Notable for the following:    Glucose-Capillary 150 (*)    All other components within normal limits  BASIC METABOLIC PANEL  CBC  PROTIME-INR  PRO B NATRIURETIC PEPTIDE   No results found.   No diagnosis found.  Cardiac: 80's afib, abnormal (though he has a Hx of Afib)  Pulse Ox 100% [place], abnormal    5:04 PM Patient's initial labs are back.  INR is appropriate.  Gauze removed, phenylephrine soaked packing applied.   5:49 PM Following pacing removal there was active bleeding in L nare.  Rhinorocket applied.   MDM  This elderly gentleman who is anticoagulated now presents with epistaxis, mild cough.  On exam he is in no distress, though he has persistent left nasal bleeding, following packing with phenylephrine infused gauze.  The patient required placement of a Rhino Rocket.  The patient's labs are notable for appropriate INR, consistent renal dysfunction, minimally elevated BNP.  A review of the patient's chart notes a history of CHF, though the patient is unaware of this diagnosis.  I spent a considerable amount time with the patient and his wife discussing this entity, and the need for continued M.D. discussion.  The patient was discharged in stable condition to follow up with ENT tomorrow.  Our plan, discussed with the patient and his wife, was to initiate antibiotics, and if he continues to be on tomorrow to have his INR checked again in several  days.  Gerhard Munch, MD 02/05/12 1800

## 2012-02-05 NOTE — Discharge Instructions (Signed)
If you continue this antibiotic for more than next 2 days, please be sure to have your INR checked with your primary care physician within the week.  As discussed, your labs today are largely reassuring, though your BNP, which is a measure of heart failure, was minimally elevated.  Please be sure to discuss this with your physician as soon as possible.

## 2012-02-05 NOTE — ED Notes (Signed)
Pt presenting to ed with c/o epitaxsis. Pt is alert and oriented at this time. Pt states he is on blood thinners

## 2012-02-13 ENCOUNTER — Ambulatory Visit (INDEPENDENT_AMBULATORY_CARE_PROVIDER_SITE_OTHER): Payer: Medicare Other | Admitting: *Deleted

## 2012-02-13 DIAGNOSIS — I4891 Unspecified atrial fibrillation: Secondary | ICD-10-CM

## 2012-02-13 DIAGNOSIS — Z7901 Long term (current) use of anticoagulants: Secondary | ICD-10-CM

## 2012-02-13 LAB — POCT INR: INR: 2.9

## 2012-02-24 ENCOUNTER — Other Ambulatory Visit: Payer: Self-pay

## 2012-02-24 MED ORDER — METFORMIN HCL 1000 MG PO TABS: 1000.0000 mg | ORAL_TABLET | Freq: Two times a day (BID) | ORAL | Status: DC

## 2012-02-24 MED ORDER — PIOGLITAZONE HCL 30 MG PO TABS: 30.0000 mg | ORAL_TABLET | Freq: Every day | ORAL | Status: DC

## 2012-02-24 MED ORDER — "PEN NEEDLES 5/16"" 31G X 8 MM MISC": 1.0000 | Status: DC

## 2012-02-24 MED ORDER — INSULIN GLARGINE 100 UNIT/ML ~~LOC~~ SOLN: 70.0000 [IU] | Freq: Every day | SUBCUTANEOUS | Status: DC

## 2012-02-27 ENCOUNTER — Other Ambulatory Visit: Payer: Self-pay

## 2012-02-27 MED ORDER — GLUCOSE BLOOD VI STRP: ORAL_STRIP | Status: DC

## 2012-02-27 MED ORDER — ONETOUCH ULTRASOFT LANCETS MISC: Status: AC

## 2012-02-27 NOTE — Telephone Encounter (Signed)
Pt called requesting Rx sent to local pharmacy for test strips and lancets

## 2012-03-05 ENCOUNTER — Ambulatory Visit (INDEPENDENT_AMBULATORY_CARE_PROVIDER_SITE_OTHER): Payer: Medicare Other | Admitting: *Deleted

## 2012-03-05 DIAGNOSIS — I4891 Unspecified atrial fibrillation: Secondary | ICD-10-CM

## 2012-03-05 DIAGNOSIS — Z7901 Long term (current) use of anticoagulants: Secondary | ICD-10-CM

## 2012-03-05 LAB — POCT INR: INR: 2

## 2012-03-08 NOTE — Progress Notes (Addendum)
I have been unable to complete the encounter. Vitals were not done at the encounter.

## 2012-03-30 ENCOUNTER — Telehealth: Payer: Self-pay | Admitting: *Deleted

## 2012-03-30 NOTE — Telephone Encounter (Signed)
Patient called recommend another podiatrist to go to for his foot problem. He is unhappy with [facility]. Please advise

## 2012-03-30 NOTE — Telephone Encounter (Signed)
Dr/ Norm Regal [facility], [facility]

## 2012-04-02 ENCOUNTER — Ambulatory Visit (INDEPENDENT_AMBULATORY_CARE_PROVIDER_SITE_OTHER): Payer: Medicare Other

## 2012-04-02 ENCOUNTER — Telehealth: Payer: Self-pay | Admitting: *Deleted

## 2012-04-02 DIAGNOSIS — Z7901 Long term (current) use of anticoagulants: Secondary | ICD-10-CM

## 2012-04-02 DIAGNOSIS — I4891 Unspecified atrial fibrillation: Secondary | ICD-10-CM

## 2012-04-02 LAB — POCT INR: INR: 2.2

## 2012-04-02 NOTE — Telephone Encounter (Signed)
Patient notified of another podiatrist to go to as he requested. Dr.Norm Regal at [facility].  Sue04/29/2013

## 2012-04-11 ENCOUNTER — Other Ambulatory Visit: Payer: Self-pay | Admitting: Internal Medicine

## 2012-04-25 ENCOUNTER — Other Ambulatory Visit: Payer: Self-pay | Admitting: *Deleted

## 2012-04-25 ENCOUNTER — Other Ambulatory Visit: Payer: Self-pay | Admitting: Internal Medicine

## 2012-04-25 MED ORDER — WARFARIN SODIUM 5 MG PO TABS: ORAL_TABLET | ORAL | Status: DC

## 2012-05-01 ENCOUNTER — Ambulatory Visit (INDEPENDENT_AMBULATORY_CARE_PROVIDER_SITE_OTHER): Payer: Medicare Other | Admitting: Pharmacist

## 2012-05-01 DIAGNOSIS — I4891 Unspecified atrial fibrillation: Secondary | ICD-10-CM

## 2012-05-01 DIAGNOSIS — Z7901 Long term (current) use of anticoagulants: Secondary | ICD-10-CM

## 2012-05-01 LAB — POCT INR: INR: 1.8

## 2012-05-02 ENCOUNTER — Other Ambulatory Visit: Payer: Self-pay | Admitting: Internal Medicine

## 2012-05-08 ENCOUNTER — Other Ambulatory Visit: Payer: Self-pay

## 2012-05-08 MED ORDER — INSULIN GLARGINE 100 UNIT/ML ~~LOC~~ SOLN: 70.0000 [IU] | Freq: Every day | SUBCUTANEOUS | Status: DC

## 2012-05-08 MED ORDER — "PEN NEEDLES 5/16"" 31G X 8 MM MISC": 1.0000 | Status: DC

## 2012-05-08 MED ORDER — METFORMIN HCL 1000 MG PO TABS: 1000.0000 mg | ORAL_TABLET | Freq: Two times a day (BID) | ORAL | Status: DC

## 2012-05-08 MED ORDER — PIOGLITAZONE HCL 30 MG PO TABS: 30.0000 mg | ORAL_TABLET | Freq: Every day | ORAL | Status: DC

## 2012-05-09 ENCOUNTER — Other Ambulatory Visit: Payer: Self-pay | Admitting: Internal Medicine

## 2012-05-15 ENCOUNTER — Encounter: Payer: Self-pay | Admitting: Internal Medicine

## 2012-05-15 ENCOUNTER — Ambulatory Visit: Payer: Medicare Other | Admitting: Internal Medicine

## 2012-05-15 ENCOUNTER — Ambulatory Visit (INDEPENDENT_AMBULATORY_CARE_PROVIDER_SITE_OTHER): Payer: Medicare Other | Admitting: Internal Medicine

## 2012-05-15 VITALS — BP 122/60 | HR 85 | Ht 72.0 in | Wt 248.4 lb

## 2012-05-15 DIAGNOSIS — G4733 Obstructive sleep apnea (adult) (pediatric): Secondary | ICD-10-CM

## 2012-05-15 DIAGNOSIS — I2789 Other specified pulmonary heart diseases: Secondary | ICD-10-CM

## 2012-05-15 DIAGNOSIS — R0989 Other specified symptoms and signs involving the circulatory and respiratory systems: Secondary | ICD-10-CM

## 2012-05-15 DIAGNOSIS — R0600 Dyspnea, unspecified: Secondary | ICD-10-CM

## 2012-05-15 NOTE — Progress Notes (Signed)
Patient ID: [name] [name], male    DOB: 1931-01-09, 78 y.o.   MRN: [id number]  HPI  04/06/11- He was unable to continue CPAP. It starts out fine but when he gets up for bathroom at night, then comes back to bed, the mask always leaks and he is unable to tolerate. He is still having to get up 2-3 x / night for bathroom. He disconnects hose and keeps mask on. He thinks the mask warms  and softens with body heat. Problems center around his need to get up frequently, but he denies any cardiac events since last here.   05/16/11- OSA  Doesn't feel much daytime sleepiness, still takes naps and enjoys them. We discussed reasons for continuing CPAP. Mask either leaks or is too tight at times. [name] makes face itch. Discussed alternatives.  11/15/11- 63 yoM former smoker followed for OSA  Has had flu vaccine. Had a hypoglycemic episode earlier today but says he is back in control today, having eaten. Denies cough or wheeze but notices some exertional dyspnea, little changed. He has given up on CPAP and feels he sleeps well. He still wakes 3 or 4 times a night for nocturia and this was part of the problem with CPAP. He takes an afternoon nap most days.  05/15/12- 40 yoM former smoker followed for OSA   Patient states doing good. OSA-he failed CPAP but is using an oral appliance he got from his dentist, Dr. Derrill Kay. Dry mouth from medications and frequent nocturia him up a lot at night and uses CPAP was too hard to manage. We reviewed his last chest x-ray. There is mild cardiac enlargement. He appears to be some fibrosis or possibly interstitial edema but it does not look like an active progressive condition. He denies cough or change in exercise tolerance. CXR - 02/05/12  IMPRESSION:  Stable chronic lung disease. No acute cardiopulmonary process.  Original Report Authenticated By: Gerrianne Scale, M.D.   Review of Systems-See HPI Constitutional:   No-   weight loss, night sweats, fevers, chills, fatigue,  lassitude. HEENT:   No-  headaches, difficulty swallowing, tooth/dental problems, sore throat,       No-  sneezing, itching, ear ache, nasal congestion, post nasal drip,  CV:  No-   chest pain, orthopnea, PND, swelling in lower extremities, anasarca, dizziness, palpitations Resp: +  shortness of breath with exertion , not at rest.              No-   productive cough,  No non-productive cough,  No- coughing up of blood.              No-   change in color of mucus.  No- wheezing.   Skin: No-   rash or lesions. GI:  No-   heartburn, indigestion, abdominal pain, nausea, vomiting,  GU:  MS:  No-   joint pain or swelling.  Neuro-     nothing unusual Psych:  No- change in mood or affect. No depression or anxiety.  No memory loss.   Objective:   Physical Exam General- Alert, Oriented, Affect-appropriate and responsive, Distress- none acute Skin- rash-none, lesions- none, excoriation- none Lymphadenopathy- none Head- atraumatic            Eyes- Gross vision intact, PERRLA, conjunctivae clear secretions            Ears- Hearing, canals-normal            Nose- Clear, no-Septal dev, mucus, polyps, erosion,  perforation             Throat- Mallampati II , mucosa very dry , drainage- none, tonsils- atrophic Neck- flexible , trachea midline, no stridor , thyroid nl, carotid no bruit Chest - symmetrical excursion , unlabored           Heart/CV-+ irreg-AFib , no murmur , no gallop  , no rub, nl s1 s2                           - JVD- none , edema- none, stasis changes- none, varices- none           Lung- clear to P&A without interstitial crackles or rales, wheeze- none, cough- none , dullness-none, rub- none           Chest wall-  Abd- tender-no, distended-no, bowel sounds-present, HSM- no Br/ Gen/ Rectal- Not done, not indicated Extrem- cyanosis- none, clubbing, none, atrophy- none, strength- nl, no clubbing Neuro- grossly intact to observation

## 2012-05-15 NOTE — Patient Instructions (Addendum)
Order- schedule PFT   Dx dyspnea  Try Biotene otc for dry mouth

## 2012-05-20 NOTE — Assessment & Plan Note (Signed)
We need to understand the role of obstructive airways disease. Plan-related function test, annual chest x-ray

## 2012-05-20 NOTE — Assessment & Plan Note (Signed)
We discussed his dry mouth complaint when I suggested Biotene. This should not interfere with use of his mouthpiece.

## 2012-05-21 ENCOUNTER — Ambulatory Visit (INDEPENDENT_AMBULATORY_CARE_PROVIDER_SITE_OTHER): Payer: Medicare Other | Admitting: *Deleted

## 2012-05-21 DIAGNOSIS — I4891 Unspecified atrial fibrillation: Secondary | ICD-10-CM

## 2012-05-21 DIAGNOSIS — Z7901 Long term (current) use of anticoagulants: Secondary | ICD-10-CM

## 2012-06-04 ENCOUNTER — Ambulatory Visit (INDEPENDENT_AMBULATORY_CARE_PROVIDER_SITE_OTHER): Payer: Medicare Other | Admitting: Internal Medicine

## 2012-06-04 ENCOUNTER — Ambulatory Visit (INDEPENDENT_AMBULATORY_CARE_PROVIDER_SITE_OTHER): Payer: Medicare Other | Admitting: *Deleted

## 2012-06-04 DIAGNOSIS — R0602 Shortness of breath: Secondary | ICD-10-CM

## 2012-06-04 DIAGNOSIS — Z7901 Long term (current) use of anticoagulants: Secondary | ICD-10-CM

## 2012-06-04 DIAGNOSIS — I4891 Unspecified atrial fibrillation: Secondary | ICD-10-CM

## 2012-06-04 LAB — PULMONARY FUNCTION TEST

## 2012-06-04 NOTE — Progress Notes (Signed)
PFT done today. 

## 2012-06-06 ENCOUNTER — Other Ambulatory Visit: Payer: Self-pay | Admitting: Internal Medicine

## 2012-06-12 ENCOUNTER — Encounter: Payer: Self-pay | Admitting: Internal Medicine

## 2012-06-13 ENCOUNTER — Other Ambulatory Visit: Payer: Self-pay | Admitting: Internal Medicine

## 2012-06-18 ENCOUNTER — Ambulatory Visit (INDEPENDENT_AMBULATORY_CARE_PROVIDER_SITE_OTHER): Payer: Medicare Other

## 2012-06-18 DIAGNOSIS — Z7901 Long term (current) use of anticoagulants: Secondary | ICD-10-CM

## 2012-06-18 DIAGNOSIS — I4891 Unspecified atrial fibrillation: Secondary | ICD-10-CM

## 2012-06-18 LAB — POCT INR: INR: 2.3

## 2012-06-19 ENCOUNTER — Ambulatory Visit (INDEPENDENT_AMBULATORY_CARE_PROVIDER_SITE_OTHER): Payer: Medicare Other | Admitting: Internal Medicine

## 2012-06-19 ENCOUNTER — Encounter: Payer: Self-pay | Admitting: Internal Medicine

## 2012-06-19 VITALS — BP 122/80 | HR 94 | Temp 98.0°F | Resp 16 | Wt 243.0 lb

## 2012-06-19 DIAGNOSIS — E538 Deficiency of other specified B group vitamins: Secondary | ICD-10-CM

## 2012-06-19 DIAGNOSIS — E039 Hypothyroidism, unspecified: Secondary | ICD-10-CM

## 2012-06-19 DIAGNOSIS — E782 Mixed hyperlipidemia: Secondary | ICD-10-CM

## 2012-06-19 DIAGNOSIS — E1065 Type 1 diabetes mellitus with hyperglycemia: Secondary | ICD-10-CM

## 2012-06-19 DIAGNOSIS — G4733 Obstructive sleep apnea (adult) (pediatric): Secondary | ICD-10-CM

## 2012-06-19 DIAGNOSIS — IMO0002 Reserved for concepts with insufficient information to code with codable children: Secondary | ICD-10-CM

## 2012-06-19 NOTE — Patient Instructions (Addendum)
Sleep apnea - a real problem based on study in '08: awakening 81 times an hour. You should give CPAP another try! Call Dr. Roxy Cedar office to have the supplier come out and check the equipment and the settings.  Diabetes - Need lab work. Also: continue Lantus 70 units at bedtime and metformin twice a day. Stop the Pioglitizone (Actos).   Cholesterol - will check lab and make adjustments as needed/   Thyroid - will check thyroid levels   Blood pressure and heart - seems OK. Will renew the lanoxin prescription.  Please come back in 7-10 days to review labs and for a more complete exam.  As a diabetic you should see me every 6 months; eye exam every year.

## 2012-06-20 ENCOUNTER — Encounter: Payer: Self-pay | Admitting: Internal Medicine

## 2012-06-20 NOTE — Assessment & Plan Note (Signed)
Reviewed with him. Strongly encouraged to retry CPAP given the serious consequences. He voices his agreement to retry CPAP  Plan He is instructed to contact Dr. Roxy Cedar office who can call in orders to CPAP provider for new mask and to calibrate equipment

## 2012-06-20 NOTE — Assessment & Plan Note (Signed)
Lab Results  Component Value Date   TSH 4.03 10/13/2009   Lab ordered - recommendations will be based on results

## 2012-06-20 NOTE — Assessment & Plan Note (Signed)
Lab Results  Component Value Date   HGBA1C 7.3* 11/18/2010   Overdue for follow-up lab - orders in. He also reports episodes of hypoglycemia.  Plan Continue lantus at present dose of 70 units  Continue metformin  D/C actos

## 2012-06-20 NOTE — Progress Notes (Signed)
Subjective:    Patient ID: [name] [name], male    DOB: 1931-06-06, 78 y.o.   MRN: [id number]  HPI Mr. [name] [name] presents for follow-up. He has not been seen for over a year. He has been followed by Dr. Gala [name] for a. Fib and right heart failure - last OV Jan '13 - note reviewed. Mr. [name] [name] denies any overt symptoms of failure.  He has OSA - reviewed sleep study from '08: AHI 80.7 with desats to 85%; with CPAP AHI to zero. He has had difficulty tolerating CPAP and is using an oral mouthpiece with some success. Explained the cardiac risks of OSA and the symptoms (explained in the past - retention is not good) as well as his test results.  He reports adherence to his diabetic regimen: lantus 70 units; metformin 1g bid; actos 30 mg. He reports elevated CBGs. He also reports getting up twice during the night for a snack due to episodes of low blood sugar. He has not had an A1c for some time.  Past Medical History  Diagnosis Date  . Family history of malignant neoplasm of gastrointestinal tract   . Unspecified constipation   . Abnormal involuntary movements   . Vitamin B12 deficiency   . Type II or unspecified type diabetes mellitus with neurological manifestations, not stated as uncontrolled   . Claudication   . Hypertrophy of prostate with urinary obstruction and other lower urinary tract symptoms (LUTS)   . Unspecified sleep apnea     NPSG 05/14/2007- AHI 80.7/ hr  . Obesity   . Mixed hyperlipidemia   . Atrial fibrillation     --myoview 07/2006: not gated. No ischemia or infarct  Diastolic HF-- Echo 02/2008 ER60% mild AI/MR. RV mild to moderately dilated/ dysfunction. ? restrictive CM . RVSP 36  . Diabetes mellitus type 1, uncontrolled   . Bladder polyps   . Diabetes mellitus   . Renal disorder    Past Surgical History  Procedure Date  . Appendectomy   . Kidney stone surgery     Kidney stone retrieval with uretal stent x 2   . Bladder polyps   . Transurethral resection of bladder   .  Orif elbow fracture    Family History  Problem Relation Age of Onset  . Lung cancer Mother 3    nonsmoker  . Colon cancer Sister   . Prostate cancer    . Stroke Father   . Hip fracture Father    History   Social History  . Marital Status: Married    Spouse Name: N/A    Number of Children: 3  . Years of Education: 16   Occupational History  . entrepenure    Social History Main Topics  . Smoking status: Former Smoker -- 1.0 packs/day for 35 years    Types: Cigarettes    Quit date: 12/05/1985  . Smokeless tobacco: Never Used  . Alcohol Use: No  . Drug Use: No  . Sexually Active: Not on file   Other Topics Concern  . Not on file   Social History Narrative   Married '55. 1 son-'64; 3 daughters- '54, '60, '62. 3 grandaughters. 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  SO in fair health       Review of Systems System review is negative for any constitutional, cardiac, pulmonary, GI or neuro symptoms or complaints other than as described in the HPI.  Objective:   Physical Exam Filed Vitals:   06/19/12 1036  BP: 122/80  Pulse: 94  Temp: 98 F (36.7 C)  Resp: 16   Tall overweight white man in no distress Cor- 2+ radial, no JVD, IRIR but rate controlled Pulm - normal respirations Neuro - A&O x 3, memory not tested. Ex - pedal edema noted.       Assessment & Plan:

## 2012-06-20 NOTE — Assessment & Plan Note (Signed)
For follow up lab. Recommendations to follow.

## 2012-06-21 ENCOUNTER — Other Ambulatory Visit (INDEPENDENT_AMBULATORY_CARE_PROVIDER_SITE_OTHER): Payer: Medicare Other

## 2012-06-21 DIAGNOSIS — E039 Hypothyroidism, unspecified: Secondary | ICD-10-CM

## 2012-06-21 DIAGNOSIS — E538 Deficiency of other specified B group vitamins: Secondary | ICD-10-CM

## 2012-06-21 DIAGNOSIS — E1065 Type 1 diabetes mellitus with hyperglycemia: Secondary | ICD-10-CM

## 2012-06-21 DIAGNOSIS — E782 Mixed hyperlipidemia: Secondary | ICD-10-CM

## 2012-06-21 LAB — COMPREHENSIVE METABOLIC PANEL
ALT: 30 U/L (ref 0–53)
Albumin: 3.8 g/dL (ref 3.5–5.2)
CO2: 31 mEq/L (ref 19–32)
Calcium: 9.6 mg/dL (ref 8.4–10.5)
Chloride: 105 mEq/L (ref 96–112)
Creatinine, Ser: 1.4 mg/dL (ref 0.4–1.5)
GFR: 52.83 mL/min — ABNORMAL LOW (ref >60.00)
Potassium: 5.2 mEq/L — ABNORMAL HIGH (ref 3.5–5.1)
Total Protein: 7.3 g/dL (ref 6.0–8.3)

## 2012-06-21 LAB — LIPID PANEL
Total CHOL/HDL Ratio: 3
Triglycerides: 135 mg/dL (ref 0.0–149.0)

## 2012-06-21 LAB — TSH: TSH: 4.17 u[IU]/mL (ref 0.35–5.50)

## 2012-06-21 LAB — HEMOGLOBIN A1C: Hgb A1c MFr Bld: 7.2 % — ABNORMAL HIGH (ref 4.6–6.5)

## 2012-06-21 LAB — HEPATIC FUNCTION PANEL: Albumin: 3.8 g/dL (ref 3.5–5.2)

## 2012-06-21 LAB — VITAMIN B12: Vitamin B-12: 133 pg/mL — ABNORMAL LOW (ref 211–911)

## 2012-06-22 ENCOUNTER — Other Ambulatory Visit: Payer: Self-pay | Admitting: Internal Medicine

## 2012-06-25 ENCOUNTER — Encounter: Payer: Self-pay | Admitting: Internal Medicine

## 2012-06-28 ENCOUNTER — Ambulatory Visit (INDEPENDENT_AMBULATORY_CARE_PROVIDER_SITE_OTHER): Payer: Medicare Other | Admitting: Internal Medicine

## 2012-06-28 ENCOUNTER — Encounter: Payer: Self-pay | Admitting: Internal Medicine

## 2012-06-28 VITALS — BP 112/62 | HR 87 | Temp 98.0°F | Resp 16 | Wt 245.0 lb

## 2012-06-28 DIAGNOSIS — E538 Deficiency of other specified B group vitamins: Secondary | ICD-10-CM

## 2012-06-28 MED ORDER — CYANOCOBALAMIN 1000 MCG/ML IJ SOLN
1000.0000 ug | Freq: Once | INTRAMUSCULAR | Status: AC
  Administered 2012-06-28: 1000 ug via INTRAMUSCULAR

## 2012-06-28 NOTE — Progress Notes (Signed)
  Subjective:    Patient ID: [name] [name], male    DOB: January 16, 1931, 78 y.o.   MRN: [id number]  HPI [name] returns due to low B12 and need to start replacement with injections. He has been doing oK: he did stop actos and does continue with lantus 70 units and metformin 1000 mg bid. His B12 was 133 (211-911).   PMH, FamHx and SocHx reviewed for any changes and relevance. Current Outpatient Prescriptions on File Prior to Visit  Medication Sig Dispense Refill  . acetaminophen (TYLENOL) 500 MG tablet Take 500 mg by mouth every 6 (six) hours as needed. For pain      . diazepam (VALIUM) 2 MG tablet Take 2 mg by mouth as needed. For anxiety      . digoxin (LANOXIN) 0.25 MG tablet TAKE 1 TABLET BY MOUTH DAILY  30 tablet  5  . furosemide (LASIX) 20 MG tablet TAKE 1 TABLET BY MOUTH ONCE DAILY  90 tablet  1  . glucose blood (ONE TOUCH ULTRA TEST) test strip Use as instructed  100 each  2  . insulin glargine (LANTUS SOLOSTAR) 100 UNIT/ML injection Inject 70 Units into the skin at bedtime.  7 mL  2  . Insulin Pen Needle (PEN NEEDLES 31GX5/16") 31G X 8 MM MISC 1 each by Does not apply route as directed.  100 each  1  . Lancets (ONETOUCH ULTRASOFT) lancets Use as instructed  100 each  2  . levothyroxine (SYNTHROID, LEVOTHROID) 100 MCG tablet TAKE ONE TABLET BY MOUTH EVERY EVENING  90 tablet  0  . lovastatin (MEVACOR) 20 MG tablet TAKE 1 TABLET BY MOUTH DAILY  30 tablet  2  . metFORMIN (GLUCOPHAGE) 1000 MG tablet Take 1 tablet (1,000 mg total) by mouth 2 (two) times daily with a meal.  180 tablet  0  . metoprolol (TOPROL-XL) 200 MG 24 hr tablet TAKE ONE (1) TABLET BY MOUTH EVERY DAY  90 tablet  5  . pantoprazole (PROTONIX) 40 MG tablet Take 1 tablet (40 mg total) by mouth daily.  30 tablet  6  . polyethylene glycol (MIRALAX) powder Take 17 g by mouth as needed. For regularity       . potassium citrate (UROCIT-K) 10 MEQ (1080 MG) SR tablet Take 10 mEq by mouth 2 (two) times daily with a meal.        .  Tamsulosin HCl (FLOMAX) 0.4 MG CAPS Take by mouth daily after supper.        . trospium (SANCTURA) 20 MG tablet Take 20 mg by mouth 2 (two) times daily.        [name] warfarin (COUMADIN) 5 MG tablet Take as directed by Coumadin clinic  90 tablet  0      Review of Systems System review is negative for any constitutional, cardiac, pulmonary, GI or neuro symptoms or complaints other than as described in the HPI.     Objective:   Physical Exam Vitals stable \\Exam  unchanged from last visit.        Assessment & Plan:

## 2012-06-28 NOTE — Assessment & Plan Note (Signed)
B12 is 133.  Plan  B12 replacement: 1,000 mc today, repeat in 2 weeks then monthly x 6

## 2012-06-28 NOTE — Patient Instructions (Addendum)
Refer to lab letter - everything is good except for B12 deficiency. Plan-  B12 injection today, again in 2 weeks and then once a month for 6 months. At that point we will recheck your B12 level.

## 2012-07-09 ENCOUNTER — Other Ambulatory Visit: Payer: Self-pay

## 2012-07-09 MED ORDER — "PEN NEEDLES 5/16"" 31G X 8 MM MISC": 1.0000 | Status: DC

## 2012-07-09 MED ORDER — GLUCOSE BLOOD VI STRP: ORAL_STRIP | Status: AC

## 2012-07-09 MED ORDER — INSULIN GLARGINE 100 UNIT/ML ~~LOC~~ SOLN: 70.0000 [IU] | Freq: Every day | SUBCUTANEOUS | Status: DC

## 2012-07-12 ENCOUNTER — Ambulatory Visit: Payer: Medicare Other

## 2012-07-16 ENCOUNTER — Ambulatory Visit (INDEPENDENT_AMBULATORY_CARE_PROVIDER_SITE_OTHER): Payer: Medicare Other | Admitting: *Deleted

## 2012-07-16 DIAGNOSIS — Z7901 Long term (current) use of anticoagulants: Secondary | ICD-10-CM

## 2012-07-16 DIAGNOSIS — I4891 Unspecified atrial fibrillation: Secondary | ICD-10-CM

## 2012-07-16 LAB — POCT INR: INR: 2.5

## 2012-07-18 ENCOUNTER — Other Ambulatory Visit: Payer: Self-pay | Admitting: Pharmacist

## 2012-07-18 MED ORDER — WARFARIN SODIUM 5 MG PO TABS: ORAL_TABLET | ORAL | Status: DC

## 2012-07-25 ENCOUNTER — Other Ambulatory Visit: Payer: Self-pay | Admitting: Internal Medicine

## 2012-08-09 ENCOUNTER — Ambulatory Visit (INDEPENDENT_AMBULATORY_CARE_PROVIDER_SITE_OTHER): Payer: Medicare Other

## 2012-08-09 DIAGNOSIS — E538 Deficiency of other specified B group vitamins: Secondary | ICD-10-CM

## 2012-08-09 MED ORDER — CYANOCOBALAMIN 1000 MCG/ML IJ SOLN
1000.0000 ug | Freq: Once | INTRAMUSCULAR | Status: AC
  Administered 2012-08-09: 1000 ug via INTRAMUSCULAR

## 2012-08-13 ENCOUNTER — Ambulatory Visit (INDEPENDENT_AMBULATORY_CARE_PROVIDER_SITE_OTHER): Payer: Medicare Other | Admitting: *Deleted

## 2012-08-13 DIAGNOSIS — Z7901 Long term (current) use of anticoagulants: Secondary | ICD-10-CM

## 2012-08-13 DIAGNOSIS — I4891 Unspecified atrial fibrillation: Secondary | ICD-10-CM

## 2012-08-13 LAB — POCT INR: INR: 4

## 2012-08-27 ENCOUNTER — Ambulatory Visit (INDEPENDENT_AMBULATORY_CARE_PROVIDER_SITE_OTHER): Payer: Medicare Other | Admitting: Pharmacist

## 2012-08-27 DIAGNOSIS — I4891 Unspecified atrial fibrillation: Secondary | ICD-10-CM

## 2012-08-27 DIAGNOSIS — Z7901 Long term (current) use of anticoagulants: Secondary | ICD-10-CM

## 2012-09-04 ENCOUNTER — Other Ambulatory Visit: Payer: Self-pay

## 2012-09-06 ENCOUNTER — Telehealth: Payer: Self-pay | Admitting: *Deleted

## 2012-09-06 NOTE — Telephone Encounter (Signed)
Patient notified to come by office and sign and fill out handicapp form for Dr. Debby Bud to complete

## 2012-09-10 ENCOUNTER — Ambulatory Visit (INDEPENDENT_AMBULATORY_CARE_PROVIDER_SITE_OTHER): Payer: Medicare Other | Admitting: *Deleted

## 2012-09-10 DIAGNOSIS — Z7901 Long term (current) use of anticoagulants: Secondary | ICD-10-CM

## 2012-09-10 DIAGNOSIS — I4891 Unspecified atrial fibrillation: Secondary | ICD-10-CM

## 2012-09-12 ENCOUNTER — Other Ambulatory Visit: Payer: Self-pay | Admitting: Internal Medicine

## 2012-09-20 ENCOUNTER — Other Ambulatory Visit: Payer: Self-pay

## 2012-10-01 ENCOUNTER — Ambulatory Visit (INDEPENDENT_AMBULATORY_CARE_PROVIDER_SITE_OTHER): Payer: Medicare Other | Admitting: Pharmacist

## 2012-10-01 DIAGNOSIS — I4891 Unspecified atrial fibrillation: Secondary | ICD-10-CM

## 2012-10-01 DIAGNOSIS — Z7901 Long term (current) use of anticoagulants: Secondary | ICD-10-CM

## 2012-10-01 LAB — POCT INR: INR: 2.5

## 2012-10-04 ENCOUNTER — Other Ambulatory Visit: Payer: Self-pay | Admitting: *Deleted

## 2012-10-04 ENCOUNTER — Ambulatory Visit: Payer: Medicare Other

## 2012-10-04 MED ORDER — WARFARIN SODIUM 5 MG PO TABS: ORAL_TABLET | ORAL | Status: DC

## 2012-10-09 ENCOUNTER — Ambulatory Visit (INDEPENDENT_AMBULATORY_CARE_PROVIDER_SITE_OTHER): Payer: Medicare Other

## 2012-10-09 DIAGNOSIS — Z23 Encounter for immunization: Secondary | ICD-10-CM

## 2012-10-10 ENCOUNTER — Other Ambulatory Visit: Payer: Self-pay | Admitting: Internal Medicine

## 2012-10-22 ENCOUNTER — Other Ambulatory Visit: Payer: Self-pay | Admitting: Internal Medicine

## 2012-10-29 ENCOUNTER — Ambulatory Visit (INDEPENDENT_AMBULATORY_CARE_PROVIDER_SITE_OTHER): Payer: Medicare Other | Admitting: Pharmacist

## 2012-10-29 DIAGNOSIS — I4891 Unspecified atrial fibrillation: Secondary | ICD-10-CM

## 2012-10-29 DIAGNOSIS — Z7901 Long term (current) use of anticoagulants: Secondary | ICD-10-CM

## 2012-11-14 ENCOUNTER — Ambulatory Visit: Payer: Medicare Other | Admitting: Internal Medicine

## 2012-11-15 ENCOUNTER — Other Ambulatory Visit (INDEPENDENT_AMBULATORY_CARE_PROVIDER_SITE_OTHER): Payer: Medicare Other

## 2012-11-15 ENCOUNTER — Ambulatory Visit (INDEPENDENT_AMBULATORY_CARE_PROVIDER_SITE_OTHER): Payer: Medicare Other | Admitting: Internal Medicine

## 2012-11-15 ENCOUNTER — Encounter: Payer: Self-pay | Admitting: Internal Medicine

## 2012-11-15 VITALS — BP 122/62 | HR 73 | Temp 97.7°F | Resp 10 | Wt 238.0 lb

## 2012-11-15 DIAGNOSIS — E1065 Type 1 diabetes mellitus with hyperglycemia: Secondary | ICD-10-CM

## 2012-11-15 DIAGNOSIS — E538 Deficiency of other specified B group vitamins: Secondary | ICD-10-CM

## 2012-11-15 DIAGNOSIS — R259 Unspecified abnormal involuntary movements: Secondary | ICD-10-CM

## 2012-11-15 DIAGNOSIS — IMO0002 Reserved for concepts with insufficient information to code with codable children: Secondary | ICD-10-CM

## 2012-11-15 DIAGNOSIS — E039 Hypothyroidism, unspecified: Secondary | ICD-10-CM

## 2012-11-15 DIAGNOSIS — E782 Mixed hyperlipidemia: Secondary | ICD-10-CM

## 2012-11-15 DIAGNOSIS — I50812 Chronic right heart failure: Secondary | ICD-10-CM

## 2012-11-15 DIAGNOSIS — N529 Male erectile dysfunction, unspecified: Secondary | ICD-10-CM

## 2012-11-15 DIAGNOSIS — I509 Heart failure, unspecified: Secondary | ICD-10-CM

## 2012-11-15 LAB — COMPREHENSIVE METABOLIC PANEL
AST: 25 U/L (ref 0–37)
Albumin: 4 g/dL (ref 3.5–5.2)
BUN: 45 mg/dL — ABNORMAL HIGH (ref 6–23)
Calcium: 9.5 mg/dL (ref 8.4–10.5)
Chloride: 104 mEq/L (ref 96–112)
Glucose, Bld: 116 mg/dL — ABNORMAL HIGH (ref 70–99)
Potassium: 4.8 mEq/L (ref 3.5–5.1)
Sodium: 139 mEq/L (ref 135–145)
Total Protein: 7.7 g/dL (ref 6.0–8.3)

## 2012-11-15 LAB — LIPID PANEL
Cholesterol: 108 mg/dL (ref 0–200)
LDL Cholesterol: 54 mg/dL (ref 0–99)

## 2012-11-15 MED ORDER — SILDENAFIL CITRATE 50 MG PO TABS: 50.0000 mg | ORAL_TABLET | Freq: Every day | ORAL | Status: DC | PRN

## 2012-11-15 MED ORDER — DIAZEPAM 2 MG PO TABS: 2.0000 mg | ORAL_TABLET | Freq: Every evening | ORAL | Status: DC | PRN

## 2012-11-15 NOTE — Patient Instructions (Addendum)
1. Diabetes- last A1C July was 7.2% indicating good control. You are due for a repeat A1C to insure good control. The recent jump in blood sugar readings may have been due to an infection at the site of your leg wound.  Plan - take a fixed daily dose of lantus = 70 units once a day (usually at bedtime)  The lantus is adjusted every 3-5 days based on the morning fasting blood sugar: if it is greater than 150 3 mornings in a row then the lantus is increased 3 units. For now, until the A1C level is back make NO changes in lantus dose.  2. Wound care - daily dressing changes. Once a day gently was the wound with soap and warm water, dry and then apply a tefla, non-adherent dressing, and cover with either the coban or with a gauze wrap (kerlex.)  3. ED - can try Viagra 50 mg as needed but it may not work if the primary cause of ED is the diabetes.   4. Pantoprazole = Protonix which is a acid suppressing medication used for gastric irritation = heart burn, indigestion. Don't need to take if not having symptoms.  5. Anxiety - internal twitching. OK to take valium 2 mg at bedtime if needed.

## 2012-11-16 LAB — TSH: TSH: 5.2 u[IU]/mL (ref 0.35–5.50)

## 2012-11-18 DIAGNOSIS — N529 Male erectile dysfunction, unspecified: Secondary | ICD-10-CM | POA: Insufficient documentation

## 2012-11-18 NOTE — Assessment & Plan Note (Signed)
last A1C July was 7.2% indicating good control. You are due for a repeat A1C to insure good control. The recent jump in blood sugar readings may have been due to an infection at the site of your leg wound.  Plan - take a fixed daily dose of lantus = 70 units once a day (usually at bedtime)  The lantus is adjusted every 3-5 days based on the morning fasting blood sugar: if it is greater than 150 3 mornings in a row then the lantus is increased 3 units. For now, until the A1C level is back make NO changes in lantus dose.  Addendum - A1C 7.7%

## 2012-11-18 NOTE — Assessment & Plan Note (Signed)
C/o poor tumescence and remains interested in being sexually active. Rx - Viagra 100 mg to take 1/2 or 1 tab as needed. Advised that if problem is diabetes related may not work.

## 2012-11-18 NOTE — Assessment & Plan Note (Signed)
Stable with no signs of decompensation.

## 2012-11-18 NOTE — Progress Notes (Signed)
Subjective:    Patient ID: [name] [name], male    DOB: 07-Feb-1931, 78 y.o.   MRN: [id number]  HPI Mr. Tamura presents due to recent jump in CBGs. He has maintained his usual regimen and diet. He does admit to using lantus as a sliding scale medications and was disavowed of this notion. He did "bark" his shin and had a draining wound. This may have been the cause of his poor glycemic control.  He did not know what his medications were for: reviewed med list and the purpose of each drug he takes.  He is generally feeling well.  Past Medical History  Diagnosis Date  . Family history of malignant neoplasm of gastrointestinal tract   . Unspecified constipation   . Abnormal involuntary movements   . Vitamin B12 deficiency   . Type II or unspecified type diabetes mellitus with neurological manifestations, not stated as uncontrolled(250.60)   . Claudication   . Hypertrophy of prostate with urinary obstruction and other lower urinary tract symptoms (LUTS)   . Unspecified sleep apnea     NPSG 05/14/2007- AHI 80.7/ hr  . Obesity   . Mixed hyperlipidemia   . Atrial fibrillation     --myoview 07/2006: not gated. No ischemia or infarct  Diastolic HF-- Echo 02/2008 ER60% mild AI/MR. RV mild to moderately dilated/ dysfunction. ? restrictive CM . RVSP 36  . Diabetes mellitus type 1, uncontrolled   . Bladder polyps   . Diabetes mellitus   . Renal disorder    Past Surgical History  Procedure Date  . Appendectomy   . Kidney stone surgery     Kidney stone retrieval with uretal stent x 2   . Bladder polyps   . Transurethral resection of bladder   . Orif elbow fracture    Family History  Problem Relation Age of Onset  . Lung cancer Mother 67    nonsmoker  . Colon cancer Sister   . Prostate cancer    . Stroke Father   . Hip fracture Father    History   Social History  . Marital Status: Married    Spouse Name: N/A    Number of Children: 3  . Years of Education: 16   Occupational History   . entrepenure    Social History Main Topics  . Smoking status: Former Smoker -- 1.0 packs/day for 35 years    Types: Cigarettes    Quit date: 12/05/1985  . Smokeless tobacco: Never Used  . Alcohol Use: No  . Drug Use: No  . Sexually Active: Not on file   Other Topics Concern  . Not on file   Social History Narrative   Married '55. 1 son-'64; 3 daughters- '54, '60, '62. 3 grandaughters. 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  SO in fair health    Current Outpatient Prescriptions on File Prior to Visit  Medication Sig Dispense Refill  . acetaminophen (TYLENOL) 500 MG tablet Take 500 mg by mouth every 6 (six) hours as needed. For pain      . digoxin (LANOXIN) 0.25 MG tablet TAKE 1 TABLET BY MOUTH DAILY  30 tablet  5  . furosemide (LASIX) 20 MG tablet TAKE 1 TABLET BY MOUTH ONCE DAILY  90 tablet  1  . glucose blood (ONE TOUCH ULTRA TEST) test strip Use as instructed  100 each  2  . Insulin Pen Needle (PEN NEEDLES  31GX5/16") 31G X 8 MM MISC 1 each by Does not apply route as directed.  100 each  1  . Lancets (ONETOUCH ULTRASOFT) lancets Use as instructed  100 each  2  . LANTUS SOLOSTAR 100 UNIT/ML injection INJECT 70 UNITS SUBCUTANEOSLY AT BEDTIME  15 mL  1  . levothyroxine (SYNTHROID, LEVOTHROID) 100 MCG tablet TAKE ONE TABLET BY MOUTH EVERY EVENING  90 tablet  1  . lovastatin (MEVACOR) 20 MG tablet TAKE 1 TABLET BY MOUTH ONCE DAILY  30 tablet  10  . metFORMIN (GLUCOPHAGE) 1000 MG tablet Take 1 tablet (1,000 mg total) by mouth 2 (two) times daily with a meal.  180 tablet  0  . metoprolol (TOPROL-XL) 200 MG 24 hr tablet TAKE ONE (1) TABLET BY MOUTH EVERY DAY  90 tablet  5  . pantoprazole (PROTONIX) 40 MG tablet Take 1 tablet (40 mg total) by mouth daily.  30 tablet  6  . polyethylene glycol (MIRALAX) powder Take 17 g by mouth as needed. For regularity       . potassium citrate (UROCIT-K) 10 MEQ  (1080 MG) SR tablet Take 10 mEq by mouth 2 (two) times daily with a meal.        . Tamsulosin HCl (FLOMAX) 0.4 MG CAPS Take by mouth daily after supper.        . trospium (SANCTURA) 20 MG tablet Take 20 mg by mouth 2 (two) times daily.        [name] warfarin (COUMADIN) 5 MG tablet Take as directed by Coumadin clinic  100 tablet  0  . sildenafil (VIAGRA) 50 MG tablet Take 1 tablet (50 mg total) by mouth daily as needed for erectile dysfunction.  6 tablet  3      Review of Systems System review is negative for any constitutional, cardiac, pulmonary, GI or neuro symptoms or complaints other than as described in the HPI.     Objective:   Physical Exam Filed Vitals:   11/15/12 1556  BP: 122/62  Pulse: 73  Temp: 97.7 F (36.5 C)  Resp: 10   Wt Readings from Last 3 Encounters:  11/15/12 238 lb (107.956 kg)  06/28/12 245 lb (111.131 kg)  06/19/12 243 lb (110.224 kg)   Gen'l - an overweight white man in no distress Cor - 2+ radial pulse, RRR Pulm - normal respirations Derm- 6 cm abrasion right shin - good granulation tissue. No purulence, no odor.     Assessment & Plan:   Wound care - daily dressing changes. Once a day gently was the wound with soap and warm water, dry and then apply a tefla, non-adherent dressing, and cover with either the coban or with a gauze wrap (kerlex.)

## 2012-11-18 NOTE — Assessment & Plan Note (Signed)
Anxiety - internal twitching. OK to take valium 2 mg at bedtime if needed.

## 2012-11-19 ENCOUNTER — Encounter: Payer: Self-pay | Admitting: Internal Medicine

## 2012-11-26 ENCOUNTER — Ambulatory Visit (INDEPENDENT_AMBULATORY_CARE_PROVIDER_SITE_OTHER): Payer: Medicare Other | Admitting: *Deleted

## 2012-11-26 DIAGNOSIS — Z7901 Long term (current) use of anticoagulants: Secondary | ICD-10-CM

## 2012-11-26 DIAGNOSIS — I4891 Unspecified atrial fibrillation: Secondary | ICD-10-CM

## 2012-11-26 LAB — POCT INR: INR: 2.2

## 2012-12-06 ENCOUNTER — Other Ambulatory Visit: Payer: Self-pay | Admitting: Internal Medicine

## 2012-12-27 ENCOUNTER — Other Ambulatory Visit: Payer: Self-pay

## 2012-12-27 MED ORDER — WARFARIN SODIUM 5 MG PO TABS: ORAL_TABLET | ORAL | Status: DC

## 2012-12-28 ENCOUNTER — Encounter: Payer: Self-pay | Admitting: Gastroenterology

## 2013-01-03 ENCOUNTER — Other Ambulatory Visit: Payer: Self-pay | Admitting: *Deleted

## 2013-01-03 MED ORDER — INSULIN GLARGINE 100 UNIT/ML ~~LOC~~ SOLN: 70.0000 [IU] | Freq: Every day | SUBCUTANEOUS | Status: DC

## 2013-01-07 ENCOUNTER — Ambulatory Visit (INDEPENDENT_AMBULATORY_CARE_PROVIDER_SITE_OTHER): Payer: Medicare Other | Admitting: *Deleted

## 2013-01-07 DIAGNOSIS — Z7901 Long term (current) use of anticoagulants: Secondary | ICD-10-CM

## 2013-01-07 DIAGNOSIS — I4891 Unspecified atrial fibrillation: Secondary | ICD-10-CM

## 2013-01-07 NOTE — Patient Instructions (Signed)
Normal saline nasal flush as directed Afrin nasal spray as directed

## 2013-01-09 ENCOUNTER — Ambulatory Visit (INDEPENDENT_AMBULATORY_CARE_PROVIDER_SITE_OTHER): Payer: Medicare Other | Admitting: General Practice

## 2013-01-09 ENCOUNTER — Telehealth: Payer: Self-pay | Admitting: Internal Medicine

## 2013-01-09 ENCOUNTER — Encounter: Payer: Self-pay | Admitting: Internal Medicine

## 2013-01-09 ENCOUNTER — Ambulatory Visit (INDEPENDENT_AMBULATORY_CARE_PROVIDER_SITE_OTHER): Payer: Medicare Other | Admitting: Internal Medicine

## 2013-01-09 VITALS — BP 120/58 | HR 69 | Temp 97.2°F | Wt 225.1 lb

## 2013-01-09 DIAGNOSIS — Z7902 Long term (current) use of antithrombotics/antiplatelets: Secondary | ICD-10-CM

## 2013-01-09 DIAGNOSIS — I4891 Unspecified atrial fibrillation: Secondary | ICD-10-CM

## 2013-01-09 DIAGNOSIS — Z7901 Long term (current) use of anticoagulants: Secondary | ICD-10-CM

## 2013-01-09 DIAGNOSIS — R040 Epistaxis: Secondary | ICD-10-CM

## 2013-01-09 NOTE — Progress Notes (Signed)
  I have personally examined and "dictated" this case as recorded by PA student.   Pt presented with recurrent L nares bleeding > 3 days.  On coumadin for A Fib.   Exam: VSS Gen: NAD, wife at side; HENT: L nares with superficial laceration/abrasion on septal wall, scabbed - after cauterization with silver nitrate stick, good hemostasis. CV: irreg irreg  A/P: Epistasis, left nares -treated with chemical cauterization A. fib, chronic anticoagulation, therapeutic -hold Coumadin for 48 hours, then resume if no recurrent bleeding   I was present and assisted with chemical cauterization treatment (silver nitrate) as noted above. I agree with history and findings as documented in note. No active bleeding and good hemostasis when pt left our office. After care instructions provided - directed to follow up with CC RN today and recheck in 48h  I reviewed, discussed and approve of the assessment and plan as listed above. Rene Paci, MD

## 2013-01-09 NOTE — Telephone Encounter (Signed)
Patient Information:  Caller Name: Suleyman  Phone: [phone]  Patient: [name] [name]  Gender: Male  DOB: [birth year]  Age: 79 Years  PCP: Illene Regulus (Adults only)  Office Follow Up:  Does the office need to follow up with this patient?: No  Instructions For The Office: N/A   Symptoms  Reason For Call & Symptoms: Patient calling, has had frequent nose bleeds over the past week.  Last one was this morning and it lasted for 2 hours.  It was a slow drip from the left nostril.  Had INR done 2/4 and same was 2.8.  Reviewed Health History In EMR: Yes  Reviewed Medications In EMR: Yes  Reviewed Allergies In EMR: Yes  Reviewed Surgeries / Procedures: Yes  Date of Onset of Symptoms: 01/02/2013  Treatments Tried: ice pack  Treatments Tried Worked: Yes  Guideline(s) Used:  Nosebleed  Disposition Per Guideline:   See Today in Office  Reason For Disposition Reached:   Taking Coumadin (warfarin), Pradaxa (dabigatran), or known bleeding disorder (e.g., thrombocytopenia)  Advice Given:  N/A  Appointment Scheduled:  01/09/2013 13:00:00 Appointment Scheduled Provider:  Rene Paci (Adults only)

## 2013-01-09 NOTE — Patient Instructions (Signed)
It was good to see you today. Hold your coumadin for next 2 days - or as directed by coumadin clinic nurse -  INR check today, repeat on Fridat as planned We cauterized her nose bleed with silver nitrate stick in office today.  Place antibiotic ointment or Vaseline to inside of the nostril (thin layer) to help keep moisturized as demonstrated today Use humidifier as discussed Call if recurrent or severe bleeding occurs between now and Friday for referral to ear nose throat specialist if needed

## 2013-01-09 NOTE — Progress Notes (Signed)
Subjective:    Patient ID: [name] [name], male    DOB: 1931/09/13, 79 y.o.   MRN: [id number]  HPI Comments: Nosebleeds sometimes last for hours.  Epistaxis  The bleeding has been from the left nare. This is a new problem. The current episode started in the past 7 days. The problem occurs every few hours. The problem has been unchanged. The bleeding is associated with anticoagulants and dry air. He has tried ice for the symptoms. The treatment provided mild relief. There is no history of allergies, a bleeding disorder, colds, frequent nosebleeds or sinus problems.   Past Medical History  Diagnosis Date  . Family history of malignant neoplasm of gastrointestinal tract   . Unspecified constipation   . Abnormal involuntary movements   . Vitamin B12 deficiency   . Type II or unspecified type diabetes mellitus with neurological manifestations, not stated as uncontrolled(250.60)   . Claudication   . Hypertrophy of prostate with urinary obstruction and other lower urinary tract symptoms (LUTS)   . Unspecified sleep apnea     NPSG 05/14/2007- AHI 80.7/ hr  . Obesity   . Mixed hyperlipidemia   . Atrial fibrillation     --myoview 07/2006: not gated. No ischemia or infarct  Diastolic HF-- Echo 02/2008 ER60% mild AI/MR. RV mild to moderately dilated/ dysfunction. ? restrictive CM . RVSP 36  . Diabetes mellitus type 1, uncontrolled   . Bladder polyps   . Diabetes mellitus   . Renal disorder    Review of Systems  Constitutional: Negative for fever and fatigue.  HENT: Positive for nosebleeds. Negative for congestion, facial swelling and rhinorrhea.   Respiratory: Negative for chest tightness and shortness of breath.   Cardiovascular: Negative for chest pain and leg swelling.  Neurological: Negative for dizziness, syncope, weakness and light-headedness.  Also see HPI above.     Objective:   Physical Exam  Constitutional: He appears well-developed and well-nourished. No distress.  HENT:  Nose:  Nose lacerations present. No mucosal edema. Epistaxis is observed.       5mm laceration to lower nasal septal wall without active bleeding.  Obvious dried blood evident in left nare.  Bilateral septal and turbinate erythema.  Cardiovascular: Normal rate, regular rhythm and normal heart sounds.   Pulmonary/Chest: Effort normal and breath sounds normal. No respiratory distress.   Lab Results  Component Value Date   WBC 4.5 02/05/2012   HGB 13.4 02/05/2012   HCT 40.7 02/05/2012   PLT 138* 02/05/2012   GLUCOSE 116* 11/15/2012   CHOL 108 11/15/2012   TRIG 97.0 11/15/2012   HDL 34.50* 11/15/2012   LDLDIRECT 49.1 12/14/2007   LDLCALC 54 11/15/2012   ALT 26 11/15/2012   AST 25 11/15/2012   NA 139 11/15/2012   K 4.8 11/15/2012   CL 104 11/15/2012   CREATININE 1.6* 11/15/2012   BUN 45* 11/15/2012   CO2 30 11/15/2012   TSH 5.20 11/15/2012   INR 2.8 01/07/2013   HGBA1C 7.7* 11/15/2012   MICROALBUR 4.5* 01/15/2007      Assessment & Plan:  Epistaxis- Pt seen with evidence of recent nosebleed.  Laceration noted to left nare septum. Silver nitrate applied to laceration site.  Pt tolerated well.  Advised on use of humidifier in home as well as use of saline nasal spray and coating the nares with petrolatum to reduce irritation of mucous membranes during healing of laceration.  Pt instructed to hold coumadin x 2 days.  Nurse visit for INR recheck and check  frequency of nosebleeds scheduled for Friday then can resume coumadin as previously prescribed pending halted epistaxis episodes. Pt advised to call office if recurrent nosebleeds or ED if unable to stop persistent nosebleed.  Consider ENT referral if nosebleeds continue. No questions reported at this time.  Will call the office with any future concerns.  Taylia Berber A Percell Lamboy PA-S  - scribing for Dr Willey Blade      I have personally examined and "dictated" this case with PA student. Pt presented with recurrent L nares bleeding > 3 days. On coumadin for A Fib.  L nares with superficial laceration/abrasion. I was present and assisted with chemical cauterization treatment (silver nitrate) as noted above. I agree with history and findings as documented above. No active bleeding and good hemostasis when pt left our office. After care instructions a provided - directed to follow up with CC RN today and recheck in 48h I reviewed, discussed and approve of this assessment and plan as listed above. Rene Paci, MD

## 2013-01-18 ENCOUNTER — Other Ambulatory Visit: Payer: Self-pay | Admitting: Internal Medicine

## 2013-01-18 ENCOUNTER — Telehealth: Payer: Self-pay | Admitting: Internal Medicine

## 2013-01-18 MED ORDER — DIAZEPAM 2 MG PO TABS: 2.0000 mg | ORAL_TABLET | Freq: Every evening | ORAL | Status: DC | PRN

## 2013-01-18 NOTE — Telephone Encounter (Signed)
Mr. Purdom calls to c/o increasing internal shaking that will even affect his tongue. This is a long standing problem that is getting worse. Getting up and moving about relieves the symptoms. In the past he was treated with Diazepam.  Plan Diazepam 2 mg qHS, #30, refill x 2 sent in  Patient needs OV follow up

## 2013-01-22 ENCOUNTER — Ambulatory Visit (INDEPENDENT_AMBULATORY_CARE_PROVIDER_SITE_OTHER): Payer: Medicare Other | Admitting: Internal Medicine

## 2013-01-22 ENCOUNTER — Encounter: Payer: Self-pay | Admitting: Internal Medicine

## 2013-01-22 VITALS — BP 118/64 | HR 82 | Temp 97.1°F | Resp 10 | Wt 224.0 lb

## 2013-01-22 DIAGNOSIS — R259 Unspecified abnormal involuntary movements: Secondary | ICD-10-CM

## 2013-01-22 NOTE — Assessment & Plan Note (Addendum)
Starting last week - increased symptoms of twitching. He called on Friday, 2/14. Valium 2mg  was called in. Good results. Suspect this may be a manifestation of anxiety especially with a good response to an anxiolytic drug.  Plan - may use valium as needed.  For progressive, more pronounced or unrelieved symptoms will need further evaluation.

## 2013-01-22 NOTE — Progress Notes (Signed)
Subjective:    Patient ID: [name] [name], male    DOB: 11-11-1931, 79 y.o.   MRN: [id number]  HPI tarting last week - increased symptoms of twitching. He called on Friday, 2/14. Valium 2mg  was called in. Good results  With valium  He reports that he has had no appetite since Bladder tumor excision by Dr. Isabel Caprice Dec 26th, transitional cell cancer recurrence.  He has lost 14 lbs. He has nausea with thoughts of eating and N/V with eating. No abdominal pain or discomfort. He does admit to loose stools 3-4 times a day. Denies black stools.   Past Medical History  Diagnosis Date  . Family history of malignant neoplasm of gastrointestinal tract   . Unspecified constipation   . Abnormal involuntary movements   . Vitamin B12 deficiency   . Type II or unspecified type diabetes mellitus with neurological manifestations, not stated as uncontrolled(250.60)   . Claudication   . Hypertrophy of prostate with urinary obstruction and other lower urinary tract symptoms (LUTS)   . Unspecified sleep apnea     NPSG 05/14/2007- AHI 80.7/ hr  . Obesity   . Mixed hyperlipidemia   . Atrial fibrillation     --myoview 07/2006: not gated. No ischemia or infarct  Diastolic HF-- Echo 02/2008 ER60% mild AI/MR. RV mild to moderately dilated/ dysfunction. ? restrictive CM . RVSP 36  . Diabetes mellitus type 1, uncontrolled   . Bladder polyps   . Diabetes mellitus   . Renal disorder    Past Surgical History  Procedure Laterality Date  . Appendectomy    . Kidney stone surgery      Kidney stone retrieval with uretal stent x 2   . Bladder polyps    . Transurethral resection of bladder    . Orif elbow fracture     Family History  Problem Relation Age of Onset  . Lung cancer Mother 71    nonsmoker  . Colon cancer Sister   . Prostate cancer    . Stroke Father   . Hip fracture Father    History   Social History  . Marital Status: Married    Spouse Name: N/A    Number of Children: 3  . Years of Education: 16    Occupational History  . entrepenure    Social History Main Topics  . Smoking status: Former Smoker -- 1.00 packs/day for 35 years    Types: Cigarettes    Quit date: 12/05/1985  . Smokeless tobacco: Never Used  . Alcohol Use: No  . Drug Use: No  . Sexually Active: Not on file   Other Topics Concern  . Not on file   Social History Narrative   Married '55. 1 son-'64; 3 daughters- '54, '60, '62. 3 grandaughters. 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  SO in fair health          Current Outpatient Prescriptions on File Prior to Visit  Medication Sig Dispense Refill  . acetaminophen (TYLENOL) 500 MG tablet Take 500 mg by mouth every 6 (six) hours as needed. For pain      . diazepam (VALIUM) 2 MG tablet Take 1 tablet (2 mg total) by mouth at bedtime as needed. For anxiety  30 tablet  3  . digoxin (LANOXIN) 0.25 MG tablet TAKE 1 TABLET BY MOUTH DAILY  30 tablet  5  . furosemide (LASIX) 20 MG tablet  TAKE 1 TABLET BY MOUTH ONCE DAILY  90 tablet  1  . glucose blood (ONE TOUCH ULTRA TEST) test strip Use as instructed  100 each  2  . insulin glargine (LANTUS SOLOSTAR) 100 UNIT/ML injection Inject 70 Units into the skin at bedtime.  15 mL  5  . Insulin Pen Needle (PEN NEEDLES 31GX5/16") 31G X 8 MM MISC 1 each by Does not apply route as directed.  100 each  1  . Lancets (ONETOUCH ULTRASOFT) lancets Use as instructed  100 each  2  . levothyroxine (SYNTHROID, LEVOTHROID) 100 MCG tablet TAKE ONE TABLET BY MOUTH EVERY EVENING  90 tablet  1  . lovastatin (MEVACOR) 20 MG tablet TAKE 1 TABLET BY MOUTH ONCE DAILY  30 tablet  10  . metFORMIN (GLUCOPHAGE) 1000 MG tablet Take 1 tablet (1,000 mg total) by mouth 2 (two) times daily with a meal.  180 tablet  0  . metFORMIN (GLUCOPHAGE) 1000 MG tablet TAKE 1 TABLET BY MOUTH TWICE DAILY WITH A MEAL  180 tablet  1  . metoprolol (TOPROL-XL) 200 MG 24 hr tablet TAKE  ONE (1) TABLET BY MOUTH EVERY DAY  90 tablet  5  . polyethylene glycol (MIRALAX) powder Take 17 g by mouth as needed. For regularity       . potassium citrate (UROCIT-K) 10 MEQ (1080 MG) SR tablet Take 10 mEq by mouth 2 (two) times daily with a meal.        . sildenafil (VIAGRA) 50 MG tablet Take 1 tablet (50 mg total) by mouth daily as needed for erectile dysfunction.  6 tablet  3  . Tamsulosin HCl (FLOMAX) 0.4 MG CAPS Take by mouth daily after supper.        . trospium (SANCTURA) 20 MG tablet Take 20 mg by mouth 2 (two) times daily.        [name] warfarin (COUMADIN) 5 MG tablet Take as directed by Coumadin clinic  100 tablet  1  . pantoprazole (PROTONIX) 40 MG tablet Take 1 tablet (40 mg total) by mouth daily.  30 tablet  6   No current facility-administered medications on file prior to visit.      Review of Systems System review is negative for any constitutional, cardiac, pulmonary, GI or neuro symptoms or complaints other than as described in the HPI.     Objective:   Physical Exam Filed Vitals:   01/22/13 1144  BP: 118/64  Pulse: 82  Temp: 97.1 F (36.2 C)  Resp: 10   Wt Readings from Last 3 Encounters:  01/22/13 224 lb (101.606 kg)  01/09/13 225 lb 1.9 oz (102.114 kg)  11/15/12 238 lb (107.956 kg)   Older white man in no distress HEENT- normal Cor - RRR Pulm - normal respirations Neuro - non-focal       Assessment & Plan:

## 2013-01-22 NOTE — Patient Instructions (Addendum)
1. Starting last week - increased symptoms of twitching. He called on Friday, 2/14. Valium 2mg  was called in. Good results. Suspect this may be a manifestation of anxiety especially with a good response to an anxiolytic drug.  Plan - may use valium as needed.  For progressive, more pronounced or unrelieved symptoms will need further evaluation.   2. Nausea and poor appetite - if you continue to have trouble with eating due to nausea we can Rx anti-emetic medication.  3. Bladder cancer - follow up with Dr. Isabel Caprice in March

## 2013-01-30 ENCOUNTER — Other Ambulatory Visit: Payer: Self-pay | Admitting: Internal Medicine

## 2013-02-06 ENCOUNTER — Telehealth: Payer: Self-pay | Admitting: Internal Medicine

## 2013-02-06 ENCOUNTER — Ambulatory Visit (INDEPENDENT_AMBULATORY_CARE_PROVIDER_SITE_OTHER): Payer: Medicare Other | Admitting: General Practice

## 2013-02-06 DIAGNOSIS — I4891 Unspecified atrial fibrillation: Secondary | ICD-10-CM

## 2013-02-06 DIAGNOSIS — Z7901 Long term (current) use of anticoagulants: Secondary | ICD-10-CM

## 2013-02-06 NOTE — Telephone Encounter (Signed)
Called pt and advised him per Dr Debby Bud that in Dec 2013 his B12 was 178; and he does advise pt to continue taking B12 inj. Pt is scheduling his next inj.

## 2013-02-06 NOTE — Telephone Encounter (Signed)
Pt called wanting to know if he is to continue taking B12 injections. Please advise.

## 2013-02-06 NOTE — Telephone Encounter (Signed)
Dec '13 - B1`2 178.  Yes, he is to continue taking B12 injections.

## 2013-02-06 NOTE — Telephone Encounter (Signed)
Patient wants to know if he is still to be taking B12 injections, please Advise

## 2013-02-07 ENCOUNTER — Ambulatory Visit (INDEPENDENT_AMBULATORY_CARE_PROVIDER_SITE_OTHER): Payer: Medicare Other | Admitting: *Deleted

## 2013-02-07 DIAGNOSIS — E538 Deficiency of other specified B group vitamins: Secondary | ICD-10-CM

## 2013-02-07 MED ORDER — CYANOCOBALAMIN 1000 MCG/ML IJ SOLN
1000.0000 ug | Freq: Once | INTRAMUSCULAR | Status: AC
  Administered 2013-02-07: 1000 ug via INTRAMUSCULAR

## 2013-02-20 ENCOUNTER — Other Ambulatory Visit: Payer: Self-pay | Admitting: Internal Medicine

## 2013-03-11 ENCOUNTER — Ambulatory Visit (INDEPENDENT_AMBULATORY_CARE_PROVIDER_SITE_OTHER): Payer: Medicare Other

## 2013-03-11 DIAGNOSIS — E538 Deficiency of other specified B group vitamins: Secondary | ICD-10-CM

## 2013-03-11 MED ORDER — CYANOCOBALAMIN 1000 MCG/ML IJ SOLN
1000.0000 ug | Freq: Once | INTRAMUSCULAR | Status: AC
  Administered 2013-03-11: 1000 ug via INTRAMUSCULAR

## 2013-04-10 ENCOUNTER — Ambulatory Visit (INDEPENDENT_AMBULATORY_CARE_PROVIDER_SITE_OTHER): Payer: Medicare Other

## 2013-04-10 ENCOUNTER — Other Ambulatory Visit: Payer: Self-pay | Admitting: Internal Medicine

## 2013-04-10 DIAGNOSIS — E538 Deficiency of other specified B group vitamins: Secondary | ICD-10-CM

## 2013-04-10 MED ORDER — CYANOCOBALAMIN 1000 MCG/ML IJ SOLN
1000.0000 ug | Freq: Once | INTRAMUSCULAR | Status: AC
  Administered 2013-04-10: 1000 ug via INTRAMUSCULAR

## 2013-05-09 ENCOUNTER — Other Ambulatory Visit: Payer: Self-pay | Admitting: Internal Medicine

## 2013-05-10 ENCOUNTER — Ambulatory Visit: Payer: Medicare Other

## 2013-05-14 ENCOUNTER — Ambulatory Visit (INDEPENDENT_AMBULATORY_CARE_PROVIDER_SITE_OTHER): Payer: Medicare Other

## 2013-05-14 DIAGNOSIS — E538 Deficiency of other specified B group vitamins: Secondary | ICD-10-CM

## 2013-05-14 MED ORDER — CYANOCOBALAMIN 1000 MCG/ML IJ SOLN
1000.0000 ug | Freq: Once | INTRAMUSCULAR | Status: AC
  Administered 2013-05-14: 1000 ug via INTRAMUSCULAR

## 2013-05-16 ENCOUNTER — Ambulatory Visit (INDEPENDENT_AMBULATORY_CARE_PROVIDER_SITE_OTHER): Payer: Medicare Other | Admitting: Internal Medicine

## 2013-05-16 ENCOUNTER — Encounter: Payer: Self-pay | Admitting: Internal Medicine

## 2013-05-16 ENCOUNTER — Ambulatory Visit (INDEPENDENT_AMBULATORY_CARE_PROVIDER_SITE_OTHER)
Admission: RE | Admit: 2013-05-16 | Discharge: 2013-05-16 | Disposition: A | Discharge status: Home / Self Care | Payer: Medicare Other | Source: Ambulatory Visit | Attending: Internal Medicine | Admitting: Internal Medicine

## 2013-05-16 VITALS — BP 120/74 | HR 74 | Ht 72.0 in | Wt 228.2 lb

## 2013-05-16 DIAGNOSIS — J449 Chronic obstructive pulmonary disease, unspecified: Secondary | ICD-10-CM

## 2013-05-16 DIAGNOSIS — J439 Emphysema, unspecified: Secondary | ICD-10-CM

## 2013-05-16 DIAGNOSIS — G4733 Obstructive sleep apnea (adult) (pediatric): Secondary | ICD-10-CM

## 2013-05-16 DIAGNOSIS — J4489 Other specified chronic obstructive pulmonary disease: Secondary | ICD-10-CM

## 2013-05-16 DIAGNOSIS — J438 Other emphysema: Secondary | ICD-10-CM

## 2013-05-16 NOTE — Progress Notes (Signed)
Quick Note:  Called spoke with patient's spouse. Advised of lab results as stated by CY. Spouse verbalized understanding and denied any questions. ______

## 2013-05-16 NOTE — Progress Notes (Signed)
Patient ID: [name] [name], male    DOB: 1931/10/15, 79 y.o.   MRN: [id number]  HPI  04/06/11- He was unable to continue CPAP. It starts out fine but when he gets up for bathroom at night, then comes back to bed, the mask always leaks and he is unable to tolerate. He is still having to get up 2-3 x / night for bathroom. He disconnects hose and keeps mask on. He thinks the mask warms  and softens with body heat. Problems center around his need to get up frequently, but he denies any cardiac events since last here.   05/16/11- OSA  Doesn't feel much daytime sleepiness, still takes naps and enjoys them. We discussed reasons for continuing CPAP. Mask either leaks or is too tight at times. [name] makes face itch. Discussed alternatives.  11/15/11- 79 yoM former smoker followed for OSA  Has had flu vaccine. Had a hypoglycemic episode earlier today but says he is back in control today, having eaten. Denies cough or wheeze but notices some exertional dyspnea, little changed. He has given up on CPAP and feels he sleeps well. He still wakes 3 or 4 times a night for nocturia and this was part of the problem with CPAP. He takes an afternoon nap most days.  05/15/12- 79 yoM former smoker followed for OSA   Patient states doing good. OSA-he failed CPAP but is using an oral appliance he got from his dentist, Dr. Derrill Kay. Dry mouth from medications and frequent nocturia him up a lot at night and uses CPAP was too hard to manage. We reviewed his last chest x-ray. There is mild cardiac enlargement. He appears to be some fibrosis or possibly interstitial edema but it does not look like an active progressive condition. He denies cough or change in exercise tolerance. CXR - 02/05/12  IMPRESSION:  Stable chronic lung disease. No acute cardiopulmonary process.  Original Report Authenticated By: Gerrianne Scale, M.D.   05/16/13- 79 yoM former smoker followed for OSA/ failed CPAP/ Oral appliance FOLLOWS FOR: states he is  sleeping fine, denies any snoring or breathing issues. Denies any wheezing, cough, or congestion. Has occasional SOB with the simplest jobs-walking, restroom,etc). Biotene helped dry mouth. Little coughing. He did not tolerate CPAP and feels stable and comfortable without it, denying significant snoring or sleepiness. PFT 06/04/2012 mild obstructive airways disease in small airways with insignificant response to bronchodilator, normal lung volumes, diffusion slightly reduced. FVC 3.66/82%, FEV1 2.53/88%, FEV1/FEC 0.69, FEF 25-75% 1.40/59%. RV 113%, TLC 98%, DLCO 72%  Review of Systems-See HPI Constitutional:   No-   weight loss, night sweats, fevers, chills, fatigue, lassitude. HEENT:   No-  headaches, difficulty swallowing, tooth/dental problems, sore throat,       No-  sneezing, itching, ear ache, nasal congestion, post nasal drip,  CV:  No-   chest pain, orthopnea, PND, swelling in lower extremities, anasarca, dizziness, palpitations Resp: +  shortness of breath with exertion , not at rest.              No-   productive cough,  No non-productive cough,  No- coughing up of blood.              No-   change in color of mucus.  No- wheezing.   Skin: No-   rash or lesions. GI:  No-   heartburn, indigestion, abdominal pain, nausea, vomiting,  GU:  MS:  No-   joint pain or swelling.  Neuro-  nothing unusual Psych:  No- change in mood or affect. No depression or anxiety.  No memory loss.   Objective:   Physical Exam General- Alert, Oriented, Affect-appropriate and responsive, Distress- none acute Skin- rash-none, lesions- none, excoriation- none Lymphadenopathy- none Head- atraumatic            Eyes- Gross vision intact, PERRLA, conjunctivae clear secretions            Ears- Hearing, canals-normal            Nose- Clear, no-Septal dev, mucus, polyps, erosion, perforation             Throat- Mallampati II , mucosa very dry , drainage- none, tonsils- atrophic Neck- flexible , trachea  midline, no stridor , thyroid nl, carotid no bruit Chest - symmetrical excursion , unlabored           Heart/CV-regular with occasional skipped , no murmur , no gallop  , no rub, nl s1 s2                           - JVD- none , edema- none, stasis changes- none, varices- none           Lung- +coarse breath sounds,  wheeze- none, cough- none , dullness-none, rub- none           Chest wall-  Abd- t Br/ Gen/ Rectal- Not done, not indicated Extrem- cyanosis- none, clubbing, none, atrophy- none, strength- nl, no clubbing. + Cane Neuro- grossly intact to observation

## 2013-05-16 NOTE — Patient Instructions (Addendum)
Order- CXR    Dx COPD  Please call as needed 

## 2013-05-20 ENCOUNTER — Telehealth: Payer: Self-pay

## 2013-05-20 NOTE — Telephone Encounter (Signed)
Patient calls stating a [employer] nurse stopped by and took his blood pressure which was 155/104. When he and his wife took it on there home system it is 130/78. Patient would like to know what he should do. Please advise.

## 2013-05-20 NOTE — Telephone Encounter (Signed)
Nurse visit - he should bring his blood pressure monitor from home and we can compare ours with his.

## 2013-05-21 ENCOUNTER — Ambulatory Visit (INDEPENDENT_AMBULATORY_CARE_PROVIDER_SITE_OTHER): Payer: Medicare Other

## 2013-05-21 DIAGNOSIS — I10 Essential (primary) hypertension: Secondary | ICD-10-CM

## 2013-05-21 NOTE — Telephone Encounter (Signed)
Pt notified and transferred to front desk to schedule a nurse visit. Also advised to bring his blood pressure monitor from home.

## 2013-05-22 ENCOUNTER — Ambulatory Visit (INDEPENDENT_AMBULATORY_CARE_PROVIDER_SITE_OTHER): Payer: Medicare Other | Admitting: General Practice

## 2013-05-22 DIAGNOSIS — Z7901 Long term (current) use of anticoagulants: Secondary | ICD-10-CM

## 2013-05-22 DIAGNOSIS — I4891 Unspecified atrial fibrillation: Secondary | ICD-10-CM

## 2013-05-29 ENCOUNTER — Other Ambulatory Visit: Payer: Self-pay | Admitting: Internal Medicine

## 2013-05-29 DIAGNOSIS — J439 Emphysema, unspecified: Secondary | ICD-10-CM | POA: Insufficient documentation

## 2013-05-29 NOTE — Assessment & Plan Note (Signed)
With little response to bronchodilator week follow this over time. He is encouraged to remain active to maintain endurance

## 2013-05-29 NOTE — Assessment & Plan Note (Signed)
He indicates he is using the oral appliance and that Biotene helps his dry mouth

## 2013-06-13 ENCOUNTER — Ambulatory Visit (INDEPENDENT_AMBULATORY_CARE_PROVIDER_SITE_OTHER): Payer: Medicare Other

## 2013-06-13 DIAGNOSIS — E538 Deficiency of other specified B group vitamins: Secondary | ICD-10-CM

## 2013-06-13 MED ORDER — CYANOCOBALAMIN 1000 MCG/ML IJ SOLN
1000.0000 ug | Freq: Once | INTRAMUSCULAR | Status: AC
  Administered 2013-06-13: 1000 ug via INTRAMUSCULAR

## 2013-06-18 ENCOUNTER — Ambulatory Visit (INDEPENDENT_AMBULATORY_CARE_PROVIDER_SITE_OTHER): Payer: Medicare Other | Admitting: General Practice

## 2013-06-18 DIAGNOSIS — I4891 Unspecified atrial fibrillation: Secondary | ICD-10-CM

## 2013-06-18 DIAGNOSIS — Z7901 Long term (current) use of anticoagulants: Secondary | ICD-10-CM

## 2013-06-18 LAB — POCT INR: INR: 2.9

## 2013-07-03 ENCOUNTER — Other Ambulatory Visit: Payer: Self-pay | Admitting: Internal Medicine

## 2013-07-11 ENCOUNTER — Other Ambulatory Visit: Payer: Self-pay | Admitting: General Practice

## 2013-07-11 MED ORDER — WARFARIN SODIUM 5 MG PO TABS: ORAL_TABLET | ORAL | Status: DC

## 2013-07-15 ENCOUNTER — Ambulatory Visit: Payer: Medicare Other

## 2013-07-16 ENCOUNTER — Ambulatory Visit (INDEPENDENT_AMBULATORY_CARE_PROVIDER_SITE_OTHER): Payer: Medicare Other | Admitting: General Practice

## 2013-07-16 DIAGNOSIS — I4891 Unspecified atrial fibrillation: Secondary | ICD-10-CM

## 2013-07-16 DIAGNOSIS — Z7901 Long term (current) use of anticoagulants: Secondary | ICD-10-CM

## 2013-07-24 ENCOUNTER — Other Ambulatory Visit: Payer: Self-pay | Admitting: Internal Medicine

## 2013-07-31 ENCOUNTER — Other Ambulatory Visit: Payer: Self-pay | Admitting: Internal Medicine

## 2013-08-13 ENCOUNTER — Ambulatory Visit (INDEPENDENT_AMBULATORY_CARE_PROVIDER_SITE_OTHER): Payer: Medicare Other | Admitting: General Practice

## 2013-08-13 DIAGNOSIS — I4891 Unspecified atrial fibrillation: Secondary | ICD-10-CM

## 2013-08-13 DIAGNOSIS — Z7901 Long term (current) use of anticoagulants: Secondary | ICD-10-CM

## 2013-08-21 ENCOUNTER — Other Ambulatory Visit: Payer: Self-pay | Admitting: Internal Medicine

## 2013-09-10 ENCOUNTER — Ambulatory Visit (INDEPENDENT_AMBULATORY_CARE_PROVIDER_SITE_OTHER): Payer: Medicare Other | Admitting: General Practice

## 2013-09-10 ENCOUNTER — Ambulatory Visit (INDEPENDENT_AMBULATORY_CARE_PROVIDER_SITE_OTHER): Payer: Medicare Other

## 2013-09-10 DIAGNOSIS — Z2911 Encounter for prophylactic immunotherapy for respiratory syncytial virus (RSV): Secondary | ICD-10-CM

## 2013-09-10 DIAGNOSIS — I4891 Unspecified atrial fibrillation: Secondary | ICD-10-CM

## 2013-09-10 DIAGNOSIS — Z23 Encounter for immunization: Secondary | ICD-10-CM

## 2013-09-10 DIAGNOSIS — Z7901 Long term (current) use of anticoagulants: Secondary | ICD-10-CM

## 2013-09-25 ENCOUNTER — Ambulatory Visit (INDEPENDENT_AMBULATORY_CARE_PROVIDER_SITE_OTHER): Payer: Medicare Other

## 2013-09-25 DIAGNOSIS — Z23 Encounter for immunization: Secondary | ICD-10-CM

## 2013-10-01 ENCOUNTER — Ambulatory Visit (INDEPENDENT_AMBULATORY_CARE_PROVIDER_SITE_OTHER): Payer: Medicare Other | Admitting: General Practice

## 2013-10-01 DIAGNOSIS — Z7901 Long term (current) use of anticoagulants: Secondary | ICD-10-CM

## 2013-10-01 DIAGNOSIS — I4891 Unspecified atrial fibrillation: Secondary | ICD-10-CM

## 2013-10-01 LAB — POCT INR: INR: 2.6

## 2013-10-03 ENCOUNTER — Encounter: Payer: Self-pay | Admitting: Internal Medicine

## 2013-10-03 ENCOUNTER — Ambulatory Visit (INDEPENDENT_AMBULATORY_CARE_PROVIDER_SITE_OTHER): Payer: Medicare Other | Admitting: Internal Medicine

## 2013-10-03 VITALS — BP 132/62 | HR 75 | Temp 98.2°F | Wt 223.4 lb

## 2013-10-03 DIAGNOSIS — E782 Mixed hyperlipidemia: Secondary | ICD-10-CM

## 2013-10-03 DIAGNOSIS — E1149 Type 2 diabetes mellitus with other diabetic neurological complication: Secondary | ICD-10-CM

## 2013-10-03 DIAGNOSIS — E538 Deficiency of other specified B group vitamins: Secondary | ICD-10-CM

## 2013-10-03 DIAGNOSIS — E039 Hypothyroidism, unspecified: Secondary | ICD-10-CM

## 2013-10-03 DIAGNOSIS — E1065 Type 1 diabetes mellitus with hyperglycemia: Secondary | ICD-10-CM

## 2013-10-03 DIAGNOSIS — IMO0002 Reserved for concepts with insufficient information to code with codable children: Secondary | ICD-10-CM

## 2013-10-03 MED ORDER — GABAPENTIN 100 MG PO CAPS: 100.0000 mg | ORAL_CAPSULE | ORAL | Status: DC

## 2013-10-03 NOTE — Progress Notes (Signed)
Subjective:    Patient ID: [name] [name], male    DOB: Jan 13, 1931, 79 y.o.   MRN: [id number]  HPI [name] [name] presents for evaluation of tingling in the right foot - it feels cold. It is worse at night but it dissipates over night. This has been a problem for several months but it seems to be getting worse. He has a h/o claudication. Patient with a h/o diabetes and B12 deficiency.  Past Medical History  Diagnosis Date  . Family history of malignant neoplasm of gastrointestinal tract   . Unspecified constipation   . Abnormal involuntary movements(781.0)   . Vitamin B12 deficiency   . Type II or unspecified type diabetes mellitus with neurological manifestations, not stated as uncontrolled(250.60)   . Claudication   . Hypertrophy of prostate with urinary obstruction and other lower urinary tract symptoms (LUTS)   . Unspecified sleep apnea     NPSG 05/14/2007- AHI 80.7/ hr  . Obesity   . Mixed hyperlipidemia   . Atrial fibrillation     --myoview 07/2006: not gated. No ischemia or infarct  Diastolic HF-- Echo 02/2008 ER60% mild AI/MR. RV mild to moderately dilated/ dysfunction. ? restrictive CM . RVSP 36  . Diabetes mellitus type 1, uncontrolled   . Bladder polyps   . Diabetes mellitus   . Renal disorder    Past Surgical History  Procedure Laterality Date  . Appendectomy    . Kidney stone surgery      Kidney stone retrieval with uretal stent x 2   . Bladder polyps    . Transurethral resection of bladder    . Orif elbow fracture     Family History  Problem Relation Age of Onset  . Lung cancer Mother 57    nonsmoker  . Colon cancer Sister   . Prostate cancer    . Stroke Father   . Hip fracture Father    History   Social History  . Marital Status: Married    Spouse Name: N/A    Number of Children: 3  . Years of Education: 16   Occupational History  . entrepenure    Social History Main Topics  . Smoking status: Former Smoker -- 1.00 packs/day for 35 years    Types:  Cigarettes    Quit date: 12/05/1985  . Smokeless tobacco: Never Used  . Alcohol Use: No  . Drug Use: No  . Sexual Activity: Not on file   Other Topics Concern  . Not on file   Social History Narrative   Married '55. 1 son-'64; 3 daughters- '54, '60, '62. 3 grandaughters. 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  SO in fair health           Current Outpatient Prescriptions on File Prior to Visit  Medication Sig Dispense Refill  . acetaminophen (TYLENOL) 500 MG tablet Take 500 mg by mouth every 6 (six) hours as needed. For pain      . diazepam (VALIUM) 2 MG tablet Take 1 tablet (2 mg total) by mouth at bedtime as needed. For anxiety  30 tablet  3  . DIGOX 250 MCG tablet TAKE 1 TABLET BY MOUTH DAILY  30 tablet  5  . furosemide (LASIX) 20 MG tablet TAKE ONE (1) TABLET BY MOUTH EVERY DAY  90 tablet  1  . Insulin Pen Needle (PEN NEEDLES 31GX5/16") 31G X 8 MM MISC 1 each  by Does not apply route as directed.  100 each  1  . LANTUS SOLOSTAR 100 UNIT/ML SOPN USE 70 UNITS AT BEDTIME AS DIRECTED  15 mL  3  . levothyroxine (SYNTHROID, LEVOTHROID) 100 MCG tablet TAKE 1 TABLET BY MOUTH DAILY  90 tablet  1  . lovastatin (MEVACOR) 20 MG tablet TAKE 1 TABLET BY MOUTH DAILY  30 tablet  5  . metFORMIN (GLUCOPHAGE) 1000 MG tablet Take 1 tablet (1,000 mg total) by mouth 2 (two) times daily with a meal.  180 tablet  0  . metFORMIN (GLUCOPHAGE) 1000 MG tablet TAKE ONE TABLET TWICE DAILY WITH MEALS  180 tablet  3  . metoprolol (TOPROL-XL) 200 MG 24 hr tablet TAKE ONE (1) TABLET BY MOUTH EVERY DAY  90 tablet  3  . polyethylene glycol (MIRALAX) powder Take 17 g by mouth as needed. For regularity       . potassium citrate (UROCIT-K) 10 MEQ (1080 MG) SR tablet Take 10 mEq by mouth 2 (two) times daily with a meal.        . Tamsulosin HCl (FLOMAX) 0.4 MG CAPS Take by mouth daily after supper.        . trospium (SANCTURA)  20 MG tablet Take 20 mg by mouth 2 (two) times daily.        [name] warfarin (COUMADIN) 5 MG tablet Take as directed by Coumadin clinic  100 tablet  0   No current facility-administered medications on file prior to visit.       Review of Systems System review is negative for any constitutional, cardiac, pulmonary, GI or neuro symptoms or complaints other than as described in the HPI.      Objective:   Physical Exam Filed Vitals:   10/03/13 1619  BP: 132/62  Pulse: 75  Temp: 98.2 F (36.8 C)   gen'l - disheveled elderly white man in no distress HEENT - [place]/AT, C&S clear Cor RRR, 2+ DP pulses Pulm - normal respirations, no increased WOB Neuro - dense loss of sensation to vibration both feet, poor light touch sensation.  Lab Results  Component Value Date   VITAMINB12 178* 11/15/2012   Lab Results  Component Value Date   HGBA1C 7.7* 11/15/2012          Assessment & Plan:

## 2013-10-03 NOTE — Patient Instructions (Signed)
Peripheral neuropathy- due to both B12 deficiency (minor player) and diabetes ( major player). It is nerve damage that causes the sensation of cold or burning and tingling. There is no cure but we try to prevent progression by controlling the diabetes and giving the B12 replacement. You do have loss of sensation in the feet so it is very IMPORTANT to inspect your feet daily - looking for any blisters, cuts or any open wound that you might not feel that could get infected.  Plan Continue treatment for diabetes.   Continue B12 shots- make nurse visit appointment with Georgiann Hahn   Return for lab work next week - no appointment for lab\  Medication: gabapentin 100 mg: take one at bedtime. May increase every 5 days as needed up to 300 mg at bedtime. For daytime discomfort may take same dosing in the AM.   Peripheral Neuropathy Peripheral neuropathy is a common disorder of your nerves resulting from damage. CAUSES  This disorder may be caused by a disease of the nerves or illness. Many neuropathies have well known causes such as:  Diabetes. This is one of the most common causes.   Uremia.   AIDS.   Nutritional deficiencies.   Other causes include mechanical pressures. These may be from:   Compression.   Injury.   Contusions or bruises.   Fracture or dislocated bones.   Pressure involving the nerves close to the surface. Nerves such as the ulnar, or radial can be injured by prolonged use of crutches.  Other injuries may come from:  Tumor.   Hemorrhage or bleeding into a nerve.   Exposure to cold or radiation.   Certain medicines or toxic substances (rare).   Vascular or collagen disorders such as:   Atherosclerosis.   Systemic lupus erythematosus.   Scleroderma.   Sarcoidosis.   Rheumatoid arthritis.   Polyarteritis nodosa.   A large number of cases are of unknown cause.  SYMPTOMS  Common problems include:  Weakness.   Numbness.   Abnormal sensations  (paresthesia) such as:   Burning.   Tickling.   Pricking.   Tingling.   Pain in the arms, hands, legs and/or feet.  TREATMENT  Therapy for this disorder differs depending on the cause. It may vary from medical treatment with medications or physical therapy among others.   For example, therapy for this disorder caused by diabetes involves control of the diabetes.   In cases where a tumor or ruptured disc is the cause, therapy may involve surgery. This would be to remove the tumor or to repair the ruptured disc.   In entrapment or compression neuropathy, treatment may consist of splinting or surgical decompression of the ulnar or median nerves. A common example of entrapment neuropathy is carpal tunnel syndrome. This has become more common because of the increasing use of computers.   Peroneal and radial compression neuropathies may require avoidance of pressure.   Physical therapy and/or splints may be useful in preventing contractures. This is a condition in which shortened muscles around joints cause abnormal and sometimes painful positioning of the joints.  Document Released: 11/11/2002 Document Revised: 08/03/2011 Document Reviewed: 11/21/2005 [facility] Patient Information 2012 [place], [place].

## 2013-10-07 NOTE — Assessment & Plan Note (Signed)
Patinet has been taking replacement therapy.  Plan F/u B12 level with recommendations to follow.

## 2013-10-07 NOTE — Assessment & Plan Note (Signed)
Lat A1C with suboptiomal control.  Plan` F/u A1c with recommendations to follow.

## 2013-10-07 NOTE — Assessment & Plan Note (Signed)
Patient with dense loss of deep vibratory sensation predominantly due to h/o poorly controlled diabetes.  Plan F/u A1C with recommendations to follow  Gabapentin starting at 100 mg qHS and may advance to 300 mg in 5 day steps  May use gabapentin in the AM as well for daytime symptoms  Instructed to check his feet every day.

## 2013-10-08 ENCOUNTER — Ambulatory Visit: Payer: Medicare Other

## 2013-10-09 ENCOUNTER — Other Ambulatory Visit: Payer: Self-pay

## 2013-10-09 MED ORDER — WARFARIN SODIUM 5 MG PO TABS: ORAL_TABLET | ORAL | Status: DC

## 2013-10-10 ENCOUNTER — Ambulatory Visit (INDEPENDENT_AMBULATORY_CARE_PROVIDER_SITE_OTHER): Payer: Medicare Other | Admitting: *Deleted

## 2013-10-10 DIAGNOSIS — E538 Deficiency of other specified B group vitamins: Secondary | ICD-10-CM

## 2013-10-10 MED ORDER — CYANOCOBALAMIN 1000 MCG/ML IJ SOLN
1000.0000 ug | Freq: Once | INTRAMUSCULAR | Status: AC
  Administered 2013-10-10: 1000 ug via INTRAMUSCULAR

## 2013-10-29 ENCOUNTER — Ambulatory Visit (INDEPENDENT_AMBULATORY_CARE_PROVIDER_SITE_OTHER): Payer: Medicare Other | Admitting: General Practice

## 2013-10-29 DIAGNOSIS — I4891 Unspecified atrial fibrillation: Secondary | ICD-10-CM

## 2013-10-29 DIAGNOSIS — Z7901 Long term (current) use of anticoagulants: Secondary | ICD-10-CM

## 2013-10-29 NOTE — Progress Notes (Signed)
Pre-visit discussion using our clinic review tool. No additional management support is needed unless otherwise documented below in the visit note.  

## 2013-11-06 ENCOUNTER — Other Ambulatory Visit: Payer: Self-pay | Admitting: Internal Medicine

## 2013-11-12 ENCOUNTER — Ambulatory Visit (INDEPENDENT_AMBULATORY_CARE_PROVIDER_SITE_OTHER): Payer: Medicare Other

## 2013-11-12 ENCOUNTER — Other Ambulatory Visit: Payer: Self-pay | Admitting: Internal Medicine

## 2013-11-12 ENCOUNTER — Other Ambulatory Visit (INDEPENDENT_AMBULATORY_CARE_PROVIDER_SITE_OTHER): Payer: Medicare Other

## 2013-11-12 DIAGNOSIS — E039 Hypothyroidism, unspecified: Secondary | ICD-10-CM

## 2013-11-12 DIAGNOSIS — E1065 Type 1 diabetes mellitus with hyperglycemia: Secondary | ICD-10-CM

## 2013-11-12 DIAGNOSIS — E538 Deficiency of other specified B group vitamins: Secondary | ICD-10-CM

## 2013-11-12 DIAGNOSIS — E782 Mixed hyperlipidemia: Secondary | ICD-10-CM

## 2013-11-12 DIAGNOSIS — E1149 Type 2 diabetes mellitus with other diabetic neurological complication: Secondary | ICD-10-CM

## 2013-11-12 LAB — HEPATIC FUNCTION PANEL
ALT: 29 U/L (ref 0–53)
AST: 24 U/L (ref 0–37)
Alkaline Phosphatase: 134 U/L — ABNORMAL HIGH (ref 39–117)
Total Bilirubin: 0.9 mg/dL (ref 0.3–1.2)

## 2013-11-12 LAB — VITAMIN B12: Vitamin B-12: >1500 pg/mL — ABNORMAL HIGH (ref 211–911)

## 2013-11-12 LAB — COMPREHENSIVE METABOLIC PANEL
ALT: 29 U/L (ref 0–53)
Alkaline Phosphatase: 134 U/L — ABNORMAL HIGH (ref 39–117)
BUN: 59 mg/dL — ABNORMAL HIGH (ref 6–23)
CO2: 28 mEq/L (ref 19–32)
Calcium: 10.1 mg/dL (ref 8.4–10.5)
Chloride: 102 mEq/L (ref 96–112)
Creatinine, Ser: 2 mg/dL — ABNORMAL HIGH (ref 0.4–1.5)
GFR: 33.72 mL/min — ABNORMAL LOW (ref >60.00)
Total Bilirubin: 0.9 mg/dL (ref 0.3–1.2)

## 2013-11-12 LAB — LIPID PANEL
Cholesterol: 125 mg/dL (ref 0–200)
HDL: 33 mg/dL — ABNORMAL LOW (ref >39.00)
LDL Cholesterol: 56 mg/dL (ref 0–99)
Triglycerides: 181 mg/dL — ABNORMAL HIGH (ref 0.0–149.0)
VLDL: 36.2 mg/dL (ref 0.0–40.0)

## 2013-11-12 LAB — TSH: TSH: 8.92 u[IU]/mL — ABNORMAL HIGH (ref 0.35–5.50)

## 2013-11-12 MED ORDER — CYANOCOBALAMIN 1000 MCG/ML IJ SOLN
1000.0000 ug | Freq: Once | INTRAMUSCULAR | Status: AC
  Administered 2013-11-12: 1000 ug via INTRAMUSCULAR

## 2013-11-18 ENCOUNTER — Encounter: Payer: Self-pay | Admitting: Internal Medicine

## 2013-11-18 ENCOUNTER — Ambulatory Visit (INDEPENDENT_AMBULATORY_CARE_PROVIDER_SITE_OTHER): Payer: Medicare Other | Admitting: Internal Medicine

## 2013-11-18 VITALS — BP 108/60 | HR 75 | Temp 97.4°F | Wt 227.6 lb

## 2013-11-18 DIAGNOSIS — N138 Other obstructive and reflux uropathy: Secondary | ICD-10-CM

## 2013-11-18 DIAGNOSIS — E1065 Type 1 diabetes mellitus with hyperglycemia: Secondary | ICD-10-CM

## 2013-11-18 DIAGNOSIS — N183 Chronic kidney disease, stage 3 unspecified: Secondary | ICD-10-CM

## 2013-11-18 DIAGNOSIS — I4891 Unspecified atrial fibrillation: Secondary | ICD-10-CM

## 2013-11-18 DIAGNOSIS — N401 Enlarged prostate with lower urinary tract symptoms: Secondary | ICD-10-CM

## 2013-11-18 DIAGNOSIS — IMO0002 Reserved for concepts with insufficient information to code with codable children: Secondary | ICD-10-CM

## 2013-11-18 DIAGNOSIS — E039 Hypothyroidism, unspecified: Secondary | ICD-10-CM

## 2013-11-18 DIAGNOSIS — E538 Deficiency of other specified B group vitamins: Secondary | ICD-10-CM

## 2013-11-18 MED ORDER — INSULIN GLARGINE 100 UNIT/ML SOLOSTAR PEN: 100.0000 [IU] | PEN_INJECTOR | Freq: Every day | SUBCUTANEOUS | Status: DC

## 2013-11-18 NOTE — Patient Instructions (Addendum)
Happy Holidays.  Thyroid function is minimally low - No adjustment in levothyroxine at this time. Will recheck your TSH in 3 months.  Kidney function - this has been variable over the years but at the last lab the kidney function has been worse than in the past. Also potassium is high Plan Be sure to continue to hydrate  Stop potassium citrate  Reduce the daily amount of calcium intake (milk)  Urology - the [place] is a probable cause of excessive  Dryness, which can also affect kidney function if you are too dry. Plan Stop the [place] and see if you are less dry and see if there is any difference in your frequency of urination              Take 2 tsp of lemon juice in water  Blood pressure  - Stop furosemide - a drug holiday. Will need to monitor blood pressure   Diabetes -  Lab Results  Component Value Date   HGBA1C 8.2* 11/12/2013   Plan Continue Lantus at 100 units and metformin  Continue to check sugar before breakfast and check at other times if you feel bad  Scrupulous attention to a NO SUGAR, low carb diet - less milk  B12 - surplus right now. No B12 supplement for 2 months than start oral B12 1,000 mcg daily   Follow up lab in 3 months: sugar, B12 and kidney function

## 2013-11-18 NOTE — Progress Notes (Signed)
Pre visit review using our clinic review tool, if applicable. No additional management support is needed unless otherwise documented below in the visit note. 

## 2013-11-19 ENCOUNTER — Telehealth: Payer: Self-pay

## 2013-11-19 DIAGNOSIS — N184 Chronic kidney disease, stage 4 (severe): Secondary | ICD-10-CM | POA: Insufficient documentation

## 2013-11-19 NOTE — Progress Notes (Signed)
Subjective:    Patient ID: [name] [name], male    DOB: Jul 21, 1931, 79 y.o.   MRN: [id number]  HPI Mr. Hatfield returns to discuss lab results: with elevated A1C, TSH, Cr and Potassium. He has been taking higher dose of lantus than before with continued poor CBGs. He does commit dietary indiscretion: sweets, carbs and lots of milk. He c/o marked xerostomia - keeps him drinking milk all night. He has nocturia q 2 hrs.  Past Medical History  Diagnosis Date  . Family history of malignant neoplasm of gastrointestinal tract   . Unspecified constipation   . Abnormal involuntary movements(781.0)   . Vitamin B12 deficiency   . Type II or unspecified type diabetes mellitus with neurological manifestations, not stated as uncontrolled(250.60)   . Claudication   . Hypertrophy of prostate with urinary obstruction and other lower urinary tract symptoms (LUTS)   . Unspecified sleep apnea     NPSG 05/14/2007- AHI 80.7/ hr  . Obesity   . Mixed hyperlipidemia   . Atrial fibrillation     --myoview 07/2006: not gated. No ischemia or infarct  Diastolic HF-- Echo 02/2008 ER60% mild AI/MR. RV mild to moderately dilated/ dysfunction. ? restrictive CM . RVSP 36  . Diabetes mellitus type 1, uncontrolled   . Bladder polyps   . Diabetes mellitus   . Renal disorder    Past Surgical History  Procedure Laterality Date  . Appendectomy    . Kidney stone surgery      Kidney stone retrieval with uretal stent x 2   . Bladder polyps    . Transurethral resection of bladder    . Orif elbow fracture     Family History  Problem Relation Age of Onset  . Lung cancer Mother 42    nonsmoker  . Colon cancer Sister   . Prostate cancer    . Stroke Father   . Hip fracture Father    History   Social History  . Marital Status: Married    Spouse Name: N/A    Number of Children: 3  . Years of Education: 16   Occupational History  . entrepenure    Social History Main Topics  . Smoking status: Former Smoker -- 1.00  packs/day for 35 years    Types: Cigarettes    Quit date: 12/05/1985  . Smokeless tobacco: Never Used  . Alcohol Use: No  . Drug Use: No  . Sexual Activity: Not on file   Other Topics Concern  . Not on file   Social History Narrative   Married '55. 1 son-'64; 3 daughters- '54, '60, '62. 3 grandaughters. 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  SO in fair health          Current Outpatient Prescriptions on File Prior to Visit  Medication Sig Dispense Refill  . acetaminophen (TYLENOL) 500 MG tablet Take 500 mg by mouth every 6 (six) hours as needed. For pain      . diazepam (VALIUM) 2 MG tablet Take 1 tablet (2 mg total) by mouth at bedtime as needed. For anxiety  30 tablet  3  . DIGOX 250 MCG tablet TAKE 1 TABLET BY MOUTH DAILY  30 tablet  5  . gabapentin (NEURONTIN) 100 MG capsule Take 1 capsule (100 mg total) by mouth as directed. 1 at bedtime. May increase up to 3 capsules at bedtime  100 capsule  3  . Insulin Pen Needle (PEN NEEDLES 31GX5/16") 31G X 8 MM MISC 1 each by Does not apply route as directed.  100 each  1  . LANTUS SOLOSTAR 100 UNIT/ML SOPN USE 70 UNITS AT BEDTIME AS DIRECTED  15 mL  3  . levothyroxine (SYNTHROID, LEVOTHROID) 100 MCG tablet TAKE 1 TABLET BY MOUTH DAILY  90 tablet  3  . lovastatin (MEVACOR) 20 MG tablet TAKE 1 TABLET BY MOUTH DAILY  30 tablet  5  . metFORMIN (GLUCOPHAGE) 1000 MG tablet Take 1 tablet (1,000 mg total) by mouth 2 (two) times daily with a meal.  180 tablet  0  . metoprolol (TOPROL-XL) 200 MG 24 hr tablet TAKE ONE (1) TABLET BY MOUTH EVERY DAY  90 tablet  3  . polyethylene glycol (MIRALAX) powder Take 17 g by mouth as needed. For regularity       . potassium citrate (UROCIT-K) 10 MEQ (1080 MG) SR tablet Take 10 mEq by mouth 2 (two) times daily with a meal.        . Tamsulosin HCl (FLOMAX) 0.4 MG CAPS Take by mouth daily after supper.        . warfarin  (COUMADIN) 5 MG tablet Take as directed by Coumadin clinic  100 tablet  0   No current facility-administered medications on file prior to visit.      Review of Systems System review is negative for any constitutional, cardiac, pulmonary, GI or neuro symptoms or complaints other than as described in the HPI.     Objective:   Physical Exam Filed Vitals:   11/18/13 1113  BP: 108/60  Pulse: 75  Temp: 97.4 F (36.3 C)   Wt Readings from Last 3 Encounters:  11/18/13 227 lb 9.6 oz (103.239 kg)  10/03/13 223 lb 6.4 oz (101.334 kg)  05/16/13 228 lb 3.2 oz (103.511 kg)   BP Readings from Last 3 Encounters:  11/18/13 108/60  10/03/13 132/62  05/16/13 120/74   Gen'l - heavyset man in no distress HEENT - normal Cor - IRIR rate controlled Pulm - normal respirations Neuro - A&O x 3, cognition mildly slowed - poor insight to his ailments.       Assessment & Plan:

## 2013-11-19 NOTE — Telephone Encounter (Signed)
Per Dr Debby Bud I spoke to patient and instructed him to continue taking Furosemide.

## 2013-11-19 NOTE — Assessment & Plan Note (Signed)
Hemodynamically stable and rate controlled.

## 2013-11-19 NOTE — Assessment & Plan Note (Signed)
Lab Results  Component Value Date   TSH 8.92* 11/12/2013   Minimal rise in TSH and patient is asymptomatic.  Plan No change in dosage at this time  Recheck TSH in 30 months

## 2013-11-19 NOTE — Telephone Encounter (Signed)
Message copied by Noreene Larsson on Tue Nov 19, 2013  8:06 AM ------      Message from: Jacques Navy      Created: Tue Nov 19, 2013  5:19 AM       On review of record and all his problems please call him and instruct him to continue furosemide (I had told him to stop it due to low BP).      Thanks ------

## 2013-11-19 NOTE — Assessment & Plan Note (Signed)
Patient on proscar and sanctura: BPH and OAB. He reports nocutria q 2 hrs. He suffers severe xerostomia  Plan Drug holiday - stop sanctura. Observe for any change in Noctura and improvement in xerostomia

## 2013-11-19 NOTE — Assessment & Plan Note (Signed)
Last A1C 8.2% He is eating sugar, some carbs and drinks 2 gallons of milk every 3 days.  Plan Continue present medications: lantus 100 u and metforimin  Reduce carbs, milk and No Sugar  Watch CBGs - may drop significantly with diet change  Repeat A1C 3 months

## 2013-11-19 NOTE — Assessment & Plan Note (Signed)
Last B12 >1500  Plan Stop B12 injections for 4-6 months  May resume B12 otc tablets 1000 mcg daily  Recheck B12 level in 6 months.

## 2013-11-19 NOTE — Assessment & Plan Note (Signed)
Patient with Creatinine of 2.0 and GFR 33. His potassium was high.  Plan Better control of diabetes  Stop exogenous potassium - potassium citrate, replace with lemon and water for stone prevention.  Will need microabluminuria and consider ACE/ARB if BP stable and Cr. Stable.

## 2014-01-01 ENCOUNTER — Other Ambulatory Visit: Payer: Self-pay | Admitting: Internal Medicine

## 2014-01-01 NOTE — Telephone Encounter (Signed)
I do not see Lasix on current med list. Should patient still be taking this

## 2014-01-07 ENCOUNTER — Encounter: Payer: Self-pay | Admitting: Internal Medicine

## 2014-01-07 ENCOUNTER — Ambulatory Visit (INDEPENDENT_AMBULATORY_CARE_PROVIDER_SITE_OTHER): Payer: Medicare Other | Admitting: General Practice

## 2014-01-07 ENCOUNTER — Ambulatory Visit (INDEPENDENT_AMBULATORY_CARE_PROVIDER_SITE_OTHER): Payer: Medicare Other | Admitting: Internal Medicine

## 2014-01-07 VITALS — BP 116/58 | HR 66 | Temp 97.2°F | Wt 229.0 lb

## 2014-01-07 DIAGNOSIS — R259 Unspecified abnormal involuntary movements: Secondary | ICD-10-CM

## 2014-01-07 DIAGNOSIS — E538 Deficiency of other specified B group vitamins: Secondary | ICD-10-CM

## 2014-01-07 DIAGNOSIS — IMO0002 Reserved for concepts with insufficient information to code with codable children: Secondary | ICD-10-CM

## 2014-01-07 DIAGNOSIS — E1065 Type 1 diabetes mellitus with hyperglycemia: Secondary | ICD-10-CM

## 2014-01-07 DIAGNOSIS — Z7901 Long term (current) use of anticoagulants: Secondary | ICD-10-CM

## 2014-01-07 DIAGNOSIS — I4891 Unspecified atrial fibrillation: Secondary | ICD-10-CM

## 2014-01-07 DIAGNOSIS — Z5181 Encounter for therapeutic drug level monitoring: Secondary | ICD-10-CM | POA: Insufficient documentation

## 2014-01-07 LAB — POCT INR: INR: 3

## 2014-01-07 MED ORDER — SERTRALINE HCL 25 MG PO TABS: 25.0000 mg | ORAL_TABLET | Freq: Every day | ORAL | Status: DC

## 2014-01-07 NOTE — Progress Notes (Signed)
Pre-visit discussion using our clinic review tool. No additional management support is needed unless otherwise documented below in the visit note.  

## 2014-01-07 NOTE — Patient Instructions (Signed)
Thanks for coming to see me.  1. Internal sense of "twitching' with actual muscle movement, including twitching of the tongue (fasiculations). Plan  If this is anxiety based - will start sertraline 25 mg once a day in the PM - for chronic anxiety  May continue Diazepam as needed  Referral to Dr. Carles Collet, a neurologist that specializes in movement disorders  2. Diabetes - last A1C 8.2 % Plan  Continue present medications  Repeat lab March 15th or after  3. B12 deficiency - last lab with B12 of >1500 Plan Stop injection replacement  IN a month start otc B12 tablets - 1,039mcg daily.  Thanks for letting me be your doctor over the years. In April you will be reassigned to one of my partners for care as I retire.

## 2014-01-07 NOTE — Progress Notes (Signed)
Pre visit review using our clinic review tool, if applicable. No additional management support is needed unless otherwise documented below in the visit note. 

## 2014-01-08 NOTE — Assessment & Plan Note (Signed)
Worsening problem - wife reports she can see muscle twitching chest wall during his episodes as well as involuntary tongue movements - fasciculations. Concern for progressive demyelinating disease.  Plan Trial of sertraline - treat any anxiety component  Continue prn diazepam  Refer to neurology - Dr. Carles Collet.

## 2014-01-08 NOTE — Assessment & Plan Note (Signed)
Last B12 > 1500.  Plan D/c B12 injections  Start B12 otc supplement 1,000 mcg daily.

## 2014-01-08 NOTE — Progress Notes (Signed)
Subjective:    Patient ID: [name] [name], male    DOB: Feb 24, 1931, 80 y.o.   MRN: [id number]  HPI [name] [name] presents for follow up of generalized twitching which has been a problem for some time but getting worse. Last week he had an episode that was severe, disturbed his sleep, did not respond quickly to 8 mg of diazepam. He was on his way to the ED and before checking in the diazepam "kicked" in and the tremoring stopped. He reports that in addition to an internal sense of twitching he will have visible muscle movement, e.g. Chest wall, and reported that even his tongue was twitching, verified by his wife. This symptom does interfere with him during the day or limit his activities.  Past Medical History  Diagnosis Date  . Family history of malignant neoplasm of gastrointestinal tract   . Unspecified constipation   . Abnormal involuntary movements(781.0)   . Vitamin B12 deficiency   . Type II or unspecified type diabetes mellitus with neurological manifestations, not stated as uncontrolled   . Claudication   . Hypertrophy of prostate with urinary obstruction and other lower urinary tract symptoms (LUTS)   . Unspecified sleep apnea     NPSG 05/14/2007- AHI 80.7/ hr  . Obesity   . Mixed hyperlipidemia   . Atrial fibrillation     --myoview 07/2006: not gated. No ischemia or infarct  Diastolic HF-- Echo 08/5283 ER60% mild AI/MR. RV mild to moderately dilated/ dysfunction. ? restrictive CM . RVSP 36  . Diabetes mellitus type 1, uncontrolled   . Bladder polyps   . Diabetes mellitus   . Renal disorder    Past Surgical History  Procedure Laterality Date  . Appendectomy    . Kidney stone surgery      Kidney stone retrieval with uretal stent x 2   . Bladder polyps    . Transurethral resection of bladder    . Orif elbow fracture     Family History  Problem Relation Age of Onset  . Lung cancer Mother 38    nonsmoker  . Colon cancer Sister   . Prostate cancer    . Stroke Father   . Hip  fracture Father    History   Social History  . Marital Status: Married    Spouse Name: N/A    Number of Children: 3  . Years of Education: 16   Occupational History  . entrepenure    Social History Main Topics  . Smoking status: Former Smoker -- 1.00 packs/day for 35 years    Types: Cigarettes    Quit date: 12/05/1985  . Smokeless tobacco: Never Used  . Alcohol Use: No  . Drug Use: No  . Sexual Activity: Not on file   Other Topics Concern  . Not on file   Social History Narrative   Married '55. 1 son-'64; 3 daughters- '54, '60, '62. 3 grandaughters. 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  SO in fair health           Current Outpatient Prescriptions on File Prior to Visit  Medication Sig Dispense Refill  . acetaminophen (TYLENOL) 500 MG tablet Take 500 mg by mouth every 6 (six) hours as needed. For pain      . diazepam (VALIUM) 2 MG tablet Take 1 tablet (2 mg total) by mouth at bedtime as needed. For anxiety  30 tablet  3  . DIGOX 250 MCG tablet TAKE 1 TABLET BY MOUTH DAILY  30 tablet  5  . furosemide (LASIX) 20 MG tablet TAKE 1 TABLET BY MOUTH ONCE DAILY  90 tablet  3  . gabapentin (NEURONTIN) 100 MG capsule Take 1 capsule (100 mg total) by mouth as directed. 1 at bedtime. May increase up to 3 capsules at bedtime  100 capsule  3  . Insulin Glargine (LANTUS SOLOSTAR) 100 UNIT/ML SOPN Inject 100 Units into the skin at bedtime.  15 mL  3  . Insulin Pen Needle (PEN NEEDLES 31GX5/16") 31G X 8 MM MISC 1 each by Does not apply route as directed.  100 each  1  . LANTUS SOLOSTAR 100 UNIT/ML SOPN USE 70 UNITS AT BEDTIME AS DIRECTED  15 mL  3  . levothyroxine (SYNTHROID, LEVOTHROID) 100 MCG tablet TAKE 1 TABLET BY MOUTH DAILY  90 tablet  3  . lovastatin (MEVACOR) 20 MG tablet TAKE 1 TABLET BY MOUTH DAILY  30 tablet  5  . metFORMIN (GLUCOPHAGE) 1000 MG tablet Take 1 tablet (1,000 mg total) by  mouth 2 (two) times daily with a meal.  180 tablet  0  . metoprolol (TOPROL-XL) 200 MG 24 hr tablet TAKE ONE (1) TABLET BY MOUTH EVERY DAY  90 tablet  3  . polyethylene glycol (MIRALAX) powder Take 17 g by mouth as needed. For regularity       . potassium citrate (UROCIT-K) 10 MEQ (1080 MG) SR tablet Take 10 mEq by mouth 2 (two) times daily with a meal.        . Tamsulosin HCl (FLOMAX) 0.4 MG CAPS Take by mouth daily after supper.        . warfarin (COUMADIN) 5 MG tablet Take as directed by Coumadin clinic  100 tablet  0   No current facility-administered medications on file prior to visit.      Review of Systems System review is negative for any constitutional, cardiac, pulmonary, GI or neuro symptoms or complaints other than as described in the HPI.     Objective:   Physical Exam Filed Vitals:   01/07/14 0810  BP: 116/58  Pulse: 66  Temp: 97.2 F (36.2 C)   Wt Readings from Last 3 Encounters:  01/07/14 229 lb (103.874 kg)  11/18/13 227 lb 9.6 oz (103.239 kg)  10/03/13 223 lb 6.4 oz (101.334 kg)   BP Readings from Last 3 Encounters:  01/07/14 116/58  11/18/13 108/60  10/03/13 132/62   Gen'l - older, heavy-set man in no distress HEENT_ C&S clear Cor 2+ radial pulse, RRR Pulm - normal respirations Neuro - Awake, alert, speech clear, cognition - not formally tested - but seems normal. Cn II - XII - nl facial symmetry, PERRRLA, EOMI; MS - no visible muscle "twitiching" no tongue fasciculations noted; MS - able to stand w/o assistance, MAE; Cerebellar - no cog-wheeling or rigidity, normal gait.        Assessment & Plan:

## 2014-01-08 NOTE — Assessment & Plan Note (Addendum)
suboptimal control with last A1C 8.2% Dec '14.  Plan Continue present regimen  Better diet  Repeat lab March 15th or after.

## 2014-01-22 ENCOUNTER — Other Ambulatory Visit: Payer: Self-pay | Admitting: Internal Medicine

## 2014-01-31 ENCOUNTER — Other Ambulatory Visit: Payer: Self-pay | Admitting: Internal Medicine

## 2014-02-07 ENCOUNTER — Other Ambulatory Visit: Payer: Self-pay | Admitting: Internal Medicine

## 2014-02-11 ENCOUNTER — Ambulatory Visit (INDEPENDENT_AMBULATORY_CARE_PROVIDER_SITE_OTHER): Payer: Medicare Other | Admitting: General Practice

## 2014-02-11 DIAGNOSIS — Z5181 Encounter for therapeutic drug level monitoring: Secondary | ICD-10-CM

## 2014-02-11 DIAGNOSIS — I4891 Unspecified atrial fibrillation: Secondary | ICD-10-CM

## 2014-02-11 LAB — POCT INR: INR: 3.4

## 2014-02-11 NOTE — Progress Notes (Signed)
Pre visit review using our clinic review tool, if applicable. No additional management support is needed unless otherwise documented below in the visit note. 

## 2014-02-13 ENCOUNTER — Ambulatory Visit (INDEPENDENT_AMBULATORY_CARE_PROVIDER_SITE_OTHER): Payer: Medicare Other | Admitting: Neurology

## 2014-02-13 ENCOUNTER — Encounter: Payer: Self-pay | Admitting: Neurology

## 2014-02-13 VITALS — BP 118/60 | HR 60 | Resp 20 | Ht 72.0 in | Wt 223.0 lb

## 2014-02-13 DIAGNOSIS — R269 Unspecified abnormalities of gait and mobility: Secondary | ICD-10-CM

## 2014-02-13 MED ORDER — CARBIDOPA-LEVODOPA 25-100 MG PO TABS: 1.0000 | ORAL_TABLET | Freq: Three times a day (TID) | ORAL | Status: DC

## 2014-02-13 MED ORDER — CARBIDOPA-LEVODOPA 25-100 MG PO TABS: ORAL_TABLET | ORAL | Status: DC

## 2014-02-13 NOTE — Patient Instructions (Addendum)
1.  Start taking sinemet as follows: 1/2 tab three times a day before meals x 1 wk, then 1/2 in am & noon & 1 in evening for a week, then 1/2 in am &1 at noon &one in evening for a week, then 1 tablet three times a day before meals    - Take one hour prior to meals and avoid taking with protein meals. If you develop nausea, take with crackers 2.  Start physical therapy for gait training. You have been referred to Neuro Rehab. They will call you directly to schedule an appointment.  Please call [phone] if you do not hear from them.  3.  Fall precautions discussed, encouraged to use cane. 4. Support group information provided.  5.  Return to clinic in 4 weeks  [facility]  Preventing Falls in the [place] are common, often dreaded events in the lives of older people. Aside from the obvious injuries and even death that may result, falls can cause wide-ranging consequences including loss of independence, mental decline, decreased activity, and mobility. Younger people are also at risk of falling, especially those with chronic illnesses and fatigue.  Ways to reduce the risk for falling:  * Examine diet and medications. Warm foods and alcohol dilate blood vessels, which can lead to dizziness when standing. Sleep aids, antidepressants, and pain medications can also increase the likelihood of a fall.  * Get a vison exam. Poor vision, cataracts, and glaucoma increase the chances of falling.  * Check foot gear. Shoes should fit snugly and have a sturdy, nonskid sole and broad, low heel.  * Participate in a physician-approved exercise program to build and maintain muscle strength and improve balance and coordination.  * Increase vitamin D intake. Vitamin D improves muscle strength and increases the amount of calcium the body is able to absorb and deposit in bones.  How to prevent falls from common hazards:  * Floors - Remove all loose wires, cords, and throw rugs. Minimize clutter. Make sure  rugs are anchored and smooth. Keep furniture in its usual place.  * Chairs - Use chairs with straight backs, armrests, and firm seats. Add firm cushions to existing pieces to add height.  * Bathroom - Install grab bars and non-skid tape in the tub or shower. Use a bathtub transfer bench or a shower chair with a back support. Use an elevated toilet seat and/or safety rails to assist standing from a low surface. Do not use towel racks or bathroom tissue holders to help you stand.  * Lighting - Make sure halls, stairways, and entrances are well-lit. Install a night light in your bathroom or hallway. Make sure there is a light switch at the top and bottom of the staircase. Turn lights on if you get up in the middle of the night. Make sure lamps or light switches are within reach of the bed if you have to get up during the night.  * Kitchen - Install non-skid rubber mats near the sink and stove. Clean spills immediately. Store frequently used utensils, pots, and pans between waist and eye level. This helps prevent reaching and bending. Sit when getting things out of the lower cupboards.  * Living room / [place] furniture with wide spaces in between, giving enough room to move around. Establish a route through the living room that gives you something to hold onto as you walk.  * Stairs - Make sure treads, rails, and rugs are secure. Install a  rail on both sides of the stairs. If stairs are a threat, it might be helpful to arrange most of your activities on the lower level to reduce the number of times you must climb the stairs.  * Entrances and doorways - Install metal handles on the walls adjacent to the doorknobs of all doors to make it more secure as you travel through the doorway.  Tips for maintaining balance:  * Keep at least one hand free at all times Try using a backpack or fanny pack to hold things rather than carrying them in your hands. Never carry objects in both hands when walking as this  interferes with keeping your balance.  * Attempt to swing both arms from front to back while walking. This might require a conscious effort if Parkinson's disease has diminished your movement. It will, however, help you to maintain balance and posture, and reduce fatigue.  * Consciously lift your feet off the ground when walking. Shuffling and dragging of the feet is a common culprit in losing your balance.  * When trying to navigate turns, use a "U" technique of facing forward and making a wide turn, rather than pivoting sharply.  * Try to stand with your feet shoulder-length apart. When your feet are close together for any length of time, you increase your risk of losing your balance and falling.  * Do one thing at a time. Do not try to walk and accomplish another task, such as reading or looking around. The decrease in your automatic reflexes complicates motor function, so the less distraction, the better.  * Do not wear rubber or gripping soled shoes, they might "catch" on the floor and cause tripping.  * Move slowly when changing positions. Use deliberate, concentrated movements and, if needed, use a grab bar or walking aid. Count fifteen (15) seconds after standing to begin walking.  * If balance is a continuous problem, you might want to consider a walking aid such as a cane, walking stick, or walker. Once you have mastered walking with help, you may be ready to try it again on your own.  This information is provided by [facility] and is not intended to replace the medical advice of your physician or other health care providers. Please consult your physician or other health care providers for advice regarding your specific medical condition.

## 2014-02-13 NOTE — Progress Notes (Signed)
a [facility] Clinic Note - Initial Visit   Date: 02/13/2014    [name] [name] MRN: [id number] DOB: [birth year]   Dear Dr Linda Hedges:  Thank you for your kind referral of [name] [name] for consultation of tremors. Although his history is well known to you, please allow [place] to reiterate it for the purpose of our medical record. The patient was accompanied to the clinic by self.    History of Present Illness: [name] [name] is a 80 y.o. right-handed Caucasian male with history of diabetes mellitus (on insulin HbA1c 8.2), hyperlipidemia, atrial fibrillation (on coumadin), BPH, and depression presenting for evaluation of tremors.    Starting early 2014, he noticed mild "internal twitching", described as muscle jerking inside and occasionally outside, but this worsened in January 2015 because his hands started shaking.  Tremors involve the hands (L >R) and occur mostly at rest, but can interfere with fine motor tasks such as eating, putting on buttons, etc.  He has not noticed anything that makes it better, but if he concentrates on the shakes, it will get better.  He has fallen twice in the past 50-months. The first fall occurred while tripping over water hoses and the second fall occurred while reaching forward to pick up something on the floor. He has trouble standing upright in the shower and feels that he staggers backwards into the walls of the shower.  He endorses that his movements have become slower over the years.  Denies any muscle stiffness.  He endorses chronic constipation.  He has been dreaming vividly more recently.  He gets startled easily especially when he is waking up.  He has never been able to smell odors and complains to his wife that food does not taste the same.  His wife has told him his voice has become more soft like "an old folks".  He endorses morning hallucinations, usually unfamiliar people standing in his room.  His mood is "about the same as usual".  Sleep  has been interrupted over the past few months, so he wakes up and watches TV and ends up napping during the day (1-3 hours/day), but denies feeling of restlessness.  He has trouble "getting his balance" in the morning, but once he is up, he is ok.  Memory has always been poor.  He is able to do his own ADLs, but has to be very careful and take his time.  He is still driving and has not been involved in any MVA, but he has to concentrate so has considerably cut down on driving.  He has "internal shakes" of his abdomen which has resolved after taking valium 2mg .  Of note, his father had similar symptoms late in his life.   Out-side paper records, electronic medical record, and images have been reviewed where available and summarized as:   Lab Results  Component Value Date   TSH 8.92* 11/12/2013   Lab Results  Component Value Date   VITAMINB12 >1500* 11/12/2013   Lab Results  Component Value Date   HGBA1C 8.2* 11/12/2013    MRI brain 03/04/2008: Negative examination. Minimal age related atrophy and chronic small vessel disease, less than often seen in healthy individuals of this age. No abnormality is seen to explain the patient's described symptoms.    Past Medical History  Diagnosis Date  . Family history of malignant neoplasm of gastrointestinal tract   . Unspecified constipation   . Abnormal involuntary movements(781.0)   . Vitamin B12 deficiency   .  Type II or unspecified type diabetes mellitus with neurological manifestations, not stated as uncontrolled   . Claudication   . Hypertrophy of prostate with urinary obstruction and other lower urinary tract symptoms (LUTS)   . Unspecified sleep apnea     NPSG 05/14/2007- AHI 80.7/ hr  . Obesity   . Mixed hyperlipidemia   . Atrial fibrillation     --myoview 07/2006: not gated. No ischemia or infarct  Diastolic HF-- Echo 0/8657 ER60% mild AI/MR. RV mild to moderately dilated/ dysfunction. ? restrictive CM . RVSP 36  . Diabetes mellitus  type 1, uncontrolled   . Bladder polyps   . Diabetes mellitus   . Renal disorder     Past Surgical History  Procedure Laterality Date  . Appendectomy    . Kidney stone surgery      Kidney stone retrieval with uretal stent x 2   . Bladder polyps    . Transurethral resection of bladder    . Orif elbow fracture       Medications:  Current Outpatient Prescriptions on File Prior to Visit  Medication Sig Dispense Refill  . acetaminophen (TYLENOL) 500 MG tablet Take 500 mg by mouth every 6 (six) hours as needed. For pain      . B-D UF III MINI PEN NEEDLES 31G X 5 MM MISC USE AS DIRECTED  100 each  5  . diazepam (VALIUM) 2 MG tablet TAKE ONE (1) TABLET BY MOUTH AT BEDTIME FOR INVOLUNTARY MOVEMENT  30 tablet  3  . DIGOX 250 MCG tablet TAKE 1 TABLET BY MOUTH DAILY  30 tablet  5  . furosemide (LASIX) 20 MG tablet TAKE 1 TABLET BY MOUTH ONCE DAILY  90 tablet  3  . gabapentin (NEURONTIN) 100 MG capsule Take 1 capsule (100 mg total) by mouth as directed. 1 at bedtime. May increase up to 3 capsules at bedtime  100 capsule  3  . Insulin Glargine (LANTUS SOLOSTAR) 100 UNIT/ML SOPN Inject 100 Units into the skin at bedtime.  15 mL  3  . Insulin Pen Needle (PEN NEEDLES 31GX5/16") 31G X 8 MM MISC 1 each by Does not apply route as directed.  100 each  1  . LANTUS SOLOSTAR 100 UNIT/ML Solostar Pen USE 70 UNITS AT BEDTIME AS DIRECTED  15 mL  3  . levothyroxine (SYNTHROID, LEVOTHROID) 100 MCG tablet TAKE 1 TABLET BY MOUTH DAILY  90 tablet  3  . lovastatin (MEVACOR) 20 MG tablet TAKE 1 TABLET BY MOUTH DAILY  30 tablet  5  . metFORMIN (GLUCOPHAGE) 1000 MG tablet Take 1 tablet (1,000 mg total) by mouth 2 (two) times daily with a meal.  180 tablet  0  . metoprolol (TOPROL-XL) 200 MG 24 hr tablet TAKE ONE (1) TABLET BY MOUTH EVERY DAY  90 tablet  3  . polyethylene glycol (MIRALAX) powder Take 17 g by mouth as needed. For regularity       . sertraline (ZOLOFT) 25 MG tablet Take 1 tablet (25 mg total) by mouth  daily.  30 tablet  11  . Tamsulosin HCl (FLOMAX) 0.4 MG CAPS Take by mouth daily after supper.        . warfarin (COUMADIN) 5 MG tablet Take as directed by anticoagulation clinic  105 tablet  1   No current facility-administered medications on file prior to visit.    Allergies:  Allergies  Allergen Reactions  . Penicillins     Family History: Family History  Problem Relation Age of  Onset  . Lung cancer Mother 19    nonsmoker  . Colon cancer Sister   . Prostate cancer    . Stroke Father   . Hip fracture Father     Social History: History   Social History  . Marital Status: Married    Spouse Name: N/A    Number of Children: 3  . Years of Education: 16   Occupational History  . entrepenure    Social History Main Topics  . Smoking status: Former Smoker -- 1.00 packs/day for 35 years    Types: Cigarettes    Quit date: 12/05/1985  . Smokeless tobacco: Never Used  . Alcohol Use: No  . Drug Use: No  . Sexual Activity: Not on file   Other Topics Concern  . Not on file   Social History Narrative   Married '55. 1 son-'64; 3 daughters- '54, '60, '62. 3 grandaughters. 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  SO in fair health          Review of Systems:  CONSTITUTIONAL: No fevers, chills, night sweats, or weight loss.   EYES: No visual changes or eye pain ENT: No hearing changes.  No history of nose bleeds.   RESPIRATORY: No cough, wheezing and shortness of breath.   CARDIOVASCULAR: Negative for chest pain, and palpitations.   GI: Negative for abdominal discomfort, blood in stools or black stools.  No recent change in bowel habits.   GU:  No history of incontinence.   MUSCLOSKELETAL: +history of joint pain or swelling.  No myalgias.   SKIN: Negative for lesions, rash, and itching.   HEMATOLOGY/ONCOLOGY: Negative for prolonged bleeding, bruising easily, and swollen nodes.      ENDOCRINE: Negative for cold or heat intolerance, polydipsia or goiter.   PSYCH:  No depression or anxiety symptoms.   NEURO: As Above.   Vital Signs:  BP 118/60  Pulse 60  Resp 20  Ht 6' (1.829 m)  Wt 223 lb (101.152 kg)  BMI 30.24 kg/m2 Pain Scale: 0 on a scale of 0-10   General Medical Exam:   General:  Well appearing, comfortable.   Eyes/ENT: see cranial nerve examination.   Neck: No masses appreciated.  Full range of motion without tenderness.  No carotid bruits. Respiratory:  Clear to auscultation, good air entry bilaterally.   Cardiac:  Irregularly irregular.   GI:  Soft, non-tender, non-distended abdomen.  No evidence of muscle twitches.   Extremities:  No deformities, edema, or skin discoloration. Good capillary refill.   Skin:  Venostasis changes over the legs.  No ulcers.  Neurological Exam: MENTAL STATUS including orientation to time, place, person, recent and remote memory, attention span and concentration, language, and fund of knowledge is fair.  MOCA test:  21/30 (-1 VS, -1 naming, -1 language, -5 recall, -1 date).  Speech is slightly low and slow, but no dysarthria.  Affect is blunted, but he smiles appropriately.  Eye contact is poor.  There is no startle reflex.    CRANIAL NERVES: II:  No visual field defects.  Limited fundoscopic exam.   III-IV-VI: Pupils equal round and reactive to light.  Normal conjugate, extra-ocular eye movements in all directions of gaze.  No nystagmus.  Mild bilateral ptosis at baseline.   V:  Normal facial sensation.  Jaw jerk is absent.   VII:  Normal facial symmetry and movements.  No pathologic facial  reflexes.  VIII:  Normal hearing and vestibular function.   IX-X:  Normal palatal movement.   XI:  Normal shoulder shrug and head rotation.   XII:  Normal tongue strength and range of motion, no deviation or fasciculation.  MOTOR:  There is a resting hand tremor (L >R) which is most noticeable at rest, but also intermittently present  with action.  No atrophy or fasciculations.  No pronator drift.  Tone is normal.    Right Upper Extremity:    Left Upper Extremity:    Deltoid  5/5   Deltoid  5/5   Biceps  5/5   Biceps  5/5   Triceps  5/5   Triceps  5/5   Wrist extensors  5/5   Wrist extensors  5/5   Wrist flexors  5/5   Wrist flexors  5/5   Finger extensors  5/5   Finger extensors  5/5   Finger flexors  5/5   Finger flexors  5/5   Dorsal interossei  5/5   Dorsal interossei  5/5   Abductor pollicis  5/5   Abductor pollicis  5/5   Tone (Ashworth scale)  0  Tone (Ashworth scale)  0   Right Lower Extremity:    Left Lower Extremity:    Hip flexors  5/5   Hip flexors  5/5   Hip extensors  5/5   Hip extensors  5/5   Knee flexors  5/5   Knee flexors  5/5   Knee extensors  5/5   Knee extensors  5/5   Dorsiflexors  5/5   Dorsiflexors  5/5   Plantarflexors  5/5   Plantarflexors  5/5   Toe extensors  5-/5   Toe extensors  5-/5   Toe flexors  5/5   Toe flexors  5/5   Tone (Ashworth scale)  0  Tone (Ashworth scale)  0   MSRs:  Right                                                                 Left brachioradialis 2+  brachioradialis 2+  biceps 2+  biceps 2+  triceps 2+  triceps 2+  patellar 0  patellar 0  ankle jerk 0  ankle jerk 0  Hoffman no  Hoffman no  plantar response mute  plantar response mute   SENSORY:  Pin prick and vibration is reduced distal to knees.  Proprioception impaired at great toe bilaterally.  Romberg's positive    COORDINATION/GAIT: Normal finger-to- nose-finger and heel-to-shin.  Finger tapping shows preserved rate and amplitide with only subtle decrement seen on the left side.  Toe tapping is slowed on the left side.  He is unable to rise from a chair without using arms.  Stooped posture, small shuffling steps with poor arm swing, left hand tremor is more noticeable.  He is unable to perform tandem or stressed gait.    IMPRESSION: [name] [name] is a delightful 80 year-old gentleman presenting for  evaluation of hand tremors.  His exam shows several findings: (1) features suggestive of tremor-predominant parkinsonism (resting asymmetrical hand tremor - L >R, shuffling gait), but his tone is normal throughout and bradykinesia is very minimal.  I would like to start him on sinemet and see how he does.  (  2) There is also evidence of dementia which may go long with parkinson's disease.  (3) Distal and symmetric peripheral neuropathy, most likely due to diabetes.  I explained that his neuropathy is predominately large fiber causing numbness and loss of balance, for which there is little in terms of management besides gait training.  If he develops small fiber symptoms such as tingling or burning paresthesias, medications can be tried.    Further, I discussed that proprioceptive loss from his neuropathy as well as possible parkinson's disease puts him at risk of falling and with him being on anticoagulation, it is imperative that we minimize his fall risk.  I would like him to start physical therapy for gait training and have strongly encouraged him to use a gait assist device, which he is agreeable to.   PLAN/RECOMMENDATIONS:  1.  Start taking sinemet as follows: 1/2 tab three times a day before meals x 1 wk, then 1/2 in am & noon & 1 in evening for a week, then 1/2 in am &1 at noon &one in evening for a week, then 1 tablet three times a day before meals 2.  Start physical therapy for gait training.  3.  Fall precautions discussed and literature provided, encouraged to use cane. 4.  Support group information provided.  5.  Encouraged tight glycemic control to prevent further injury to nerves 6.  Return to clinic in 4 weeks   The duration of this appointment visit was 60 minutes of face-to-face time with the patient.  Greater than 50% of this time was spent in counseling, explanation of diagnosis, planning of further management, and coordination of care.   Thank you for allowing me to participate  in patient's care.  If I can answer any additional questions, I would be pleased to do so.    Sincerely,    Maneh Sieben K. Posey Pronto, DO

## 2014-02-14 ENCOUNTER — Telehealth: Payer: Self-pay | Admitting: Neurology

## 2014-02-14 NOTE — Telephone Encounter (Signed)
Patient states without driving he can not make it to [facility]. Referral to [facility] PT/OT/ST faxed to [phone] with confirmation received. They will contact patient.

## 2014-02-19 ENCOUNTER — Other Ambulatory Visit: Payer: Self-pay | Admitting: Internal Medicine

## 2014-03-11 ENCOUNTER — Ambulatory Visit (INDEPENDENT_AMBULATORY_CARE_PROVIDER_SITE_OTHER): Payer: Medicare Other | Admitting: General Practice

## 2014-03-11 DIAGNOSIS — I4891 Unspecified atrial fibrillation: Secondary | ICD-10-CM

## 2014-03-11 DIAGNOSIS — Z5181 Encounter for therapeutic drug level monitoring: Secondary | ICD-10-CM

## 2014-03-11 LAB — POCT INR: INR: 3

## 2014-03-11 NOTE — Progress Notes (Signed)
Pre visit review using our clinic review tool, if applicable. No additional management support is needed unless otherwise documented below in the visit note. 

## 2014-03-13 ENCOUNTER — Ambulatory Visit: Payer: Medicare Other | Admitting: Neurology

## 2014-03-14 ENCOUNTER — Telehealth: Payer: Self-pay | Admitting: Neurology

## 2014-03-14 NOTE — Telephone Encounter (Signed)
Pt no showed 03/13/14 follow up w/ Dr. Posey Pronto. No show letter mailed to pt / Sherri S.

## 2014-03-24 ENCOUNTER — Other Ambulatory Visit: Payer: Self-pay | Admitting: *Deleted

## 2014-03-24 MED ORDER — GABAPENTIN 100 MG PO CAPS: 100.0000 mg | ORAL_CAPSULE | ORAL | Status: DC

## 2014-04-04 ENCOUNTER — Ambulatory Visit (INDEPENDENT_AMBULATORY_CARE_PROVIDER_SITE_OTHER): Payer: Medicare Other | Admitting: Neurology

## 2014-04-04 ENCOUNTER — Encounter: Payer: Self-pay | Admitting: Neurology

## 2014-04-04 VITALS — BP 110/58 | HR 65 | Wt 216.5 lb

## 2014-04-04 DIAGNOSIS — E1142 Type 2 diabetes mellitus with diabetic polyneuropathy: Secondary | ICD-10-CM

## 2014-04-04 DIAGNOSIS — E1149 Type 2 diabetes mellitus with other diabetic neurological complication: Secondary | ICD-10-CM

## 2014-04-04 DIAGNOSIS — E1140 Type 2 diabetes mellitus with diabetic neuropathy, unspecified: Secondary | ICD-10-CM

## 2014-04-04 DIAGNOSIS — G20 Parkinson's disease: Secondary | ICD-10-CM

## 2014-04-04 MED ORDER — CARBIDOPA-LEVODOPA 25-100 MG PO TABS: 1.0000 | ORAL_TABLET | Freq: Two times a day (BID) | ORAL | Status: DC

## 2014-04-04 NOTE — Progress Notes (Signed)
Follow-up Visit   Date: 04/04/2014    [name] [name] MRN: [id number] DOB: 1931-12-05   Interim History: [name] [name] is a 80 y.o. right-handed Caucasian male with history of diabetes mellitus (on insulin HbA1c 8.2), hyperlipidemia, atrial fibrillation (on coumadin), BPH, and depression returning to the clinic for follow-up of recently diagnosed parkinson disease.  The patient was accompanied to the clinic by son who also provides collateral information.  He was last seen in the clinic 02/13/2014.  History of present illness: Starting early 2014, he noticed mild "internal twitching", described as muscle jerking inside and occasionally outside, but this worsened in January 2015 because his hands started shaking. Tremors involve the hands (L >R) and occur mostly at rest, but can interfere with fine motor tasks such as eating, putting on buttons, etc. He has not noticed anything that makes it better, but if he concentrates on the shakes, it will get better. He has fallen twice in the past 59-months. The first fall occurred while tripping over water hoses and the second fall occurred while reaching forward to pick up something on the floor. He has trouble standing upright in the shower and feels that he staggers backwards into the walls of the shower. He endorses that his movements have become slower over the years. Denies any muscle stiffness.   He endorses chronic constipation and vivid dreams. He gets startled easily especially when he is waking up. He has never been able to smell odors and complains to his wife that food does not taste the same. His wife has told him his voice has become more soft like "an old folks". He endorses morning hallucinations, usually unfamiliar people standing in his room. His mood is "about the same as usual". Sleep has been interrupted over the past few months, so he wakes up and watches TV and ends up napping during the day (1-3 hours/day), but denies feeling of  restlessness. He has trouble "getting his balance" in the morning, but once he is up, he is ok. Memory has always been poor. He is able to do his own ADLs, but has to be very careful and take his time. He is still driving and has not been involved in any MVA, but he has to concentrate so has considerably cut down on driving.   He has "internal shakes" of his abdomen which has resolved after taking valium 2mg . Of note, his father had similar symptoms late in his life.  - Follow-up 04/04/2014: He reports having significant improvement of tremors and "internal shakes" after starting sinemet.  Fine motor movements are much easier and he seems happy with the medication effect.  He is not experiencing side effects taking sinemet 25/100 one tablet twice daily.   Medications:  Current Outpatient Prescriptions on File Prior to Visit  Medication Sig Dispense Refill  . acetaminophen (TYLENOL) 500 MG tablet Take 500 mg by mouth every 6 (six) hours as needed. For pain      . B-D UF III MINI PEN NEEDLES 31G X 5 MM MISC USE AS DIRECTED  100 each  5  . carbidopa-levodopa (SINEMET IR) 25-100 MG per tablet Take 1 tablet by mouth 3 (three) times daily.  60 tablet  2  . diazepam (VALIUM) 2 MG tablet TAKE ONE (1) TABLET BY MOUTH AT BEDTIME FOR INVOLUNTARY MOVEMENT  30 tablet  3  . DIGOX 250 MCG tablet TAKE 1 TABLET BY MOUTH DAILY  30 tablet  5  . furosemide (LASIX) 20 MG  tablet TAKE 1 TABLET BY MOUTH ONCE DAILY  90 tablet  3  . gabapentin (NEURONTIN) 100 MG capsule Take 1 capsule (100 mg total) by mouth as directed. 1 at bedtime. May increase up to 3 capsules at bedtime  100 capsule  3  . Insulin Glargine (LANTUS SOLOSTAR) 100 UNIT/ML SOPN Inject 100 Units into the skin at bedtime.  15 mL  3  . Insulin Pen Needle (PEN NEEDLES 31GX5/16") 31G X 8 MM MISC 1 each by Does not apply route as directed.  100 each  1  . LANTUS SOLOSTAR 100 UNIT/ML Solostar Pen USE 70 UNITS AT BEDTIME AS DIRECTED  15 mL  3  . levothyroxine  (SYNTHROID, LEVOTHROID) 100 MCG tablet TAKE 1 TABLET BY MOUTH DAILY  90 tablet  3  . lovastatin (MEVACOR) 20 MG tablet TAKE 1 TABLET BY MOUTH DAILY  30 tablet  5  . metFORMIN (GLUCOPHAGE) 1000 MG tablet Take 1 tablet (1,000 mg total) by mouth 2 (two) times daily with a meal.  180 tablet  0  . metoprolol (TOPROL-XL) 200 MG 24 hr tablet TAKE ONE (1) TABLET BY MOUTH EVERY DAY  90 tablet  3  . polyethylene glycol (MIRALAX) powder Take 17 g by mouth as needed. For regularity       . sertraline (ZOLOFT) 25 MG tablet Take 1 tablet (25 mg total) by mouth daily.  30 tablet  11  . Tamsulosin HCl (FLOMAX) 0.4 MG CAPS Take by mouth daily after supper.        . trospium (SANCTURA) 20 MG tablet Take 20 mg by mouth 2 (two) times daily.      [name] warfarin (COUMADIN) 5 MG tablet Take as directed by anticoagulation clinic  105 tablet  1   No current facility-administered medications on file prior to visit.    Allergies:  Allergies  Allergen Reactions  . Penicillins      Review of Systems:  CONSTITUTIONAL: No fevers, chills, night sweats, or weight loss.   EYES: No visual changes or eye pain ENT: No hearing changes.  No history of nose bleeds.   RESPIRATORY: No cough, wheezing and shortness of breath.   CARDIOVASCULAR: Negative for chest pain, and palpitations.   GI: Negative for abdominal discomfort, blood in stools or black stools.  No recent change in bowel habits.   GU:  No history of incontinence.   MUSCLOSKELETAL: No history of joint pain or swelling.  No myalgias.   SKIN: Negative for lesions, rash, and itching.   ENDOCRINE: Negative for cold or heat intolerance, polydipsia or goiter.   PSYCH:  No depression or anxiety symptoms.   NEURO: As Above.   Vital Signs:  BP 110/58  Pulse 65  Wt 216 lb 8 oz (98.204 kg)  SpO2 92%   Neurological Exam: MENTAL STATUS including orientation to time, place, person, recent and remote memory, attention span and concentration, language, and fund of  knowledge is moderately intact.  Affect is blunted, but he smiles appropriately.  Speech is mildly soft, no dysarthria.  CRANIAL NERVES: Pupils equal round and reactive to light.  Normal conjugate, extra-ocular eye movements in all directions of gaze.  Mild bilateral ptosis. Face is symmetric. Palate elevates symmetrically.  Tongue is midline.  MOTOR:  Motor strength is 5/5 in all extremities, except toe flexors and toe extensors are 5-/5 bilaterally.  Tone is normal.  Intermittent resting hand tremor (L>R, improved).  MSRs:  Reflexes are 2+/4 in the upper extremities, absent in the lower  extremities bilaterally.  SENSORY: Pin prick and vibration is reduced distal to knees. Proprioception impaired at great toe bilaterally. Romberg's positive   COORDINATION/GAIT:  Finger tapping shows preserved rate and amplitide with only subtle decrement seen on the left side. Toe tapping is slowed on the left side. He is unable to rise from a chair without using arms. Stooped posture, small shuffling steps with poor arm swing, left hand tremor is more noticeable.    Data: Lab Results  Component Value Date   HGBA1C 8.2* 11/12/2013   Lab Results  Component Value Date   VITAMINB12 >1500* 11/12/2013   Lab Results  Component Value Date   TSH 8.92* 11/12/2013     IMPRESSION: 1. 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 Distal and symmetric peripheral neuropathy, most likely due to diabetes  - Painful paresthesias controlled on neurontin 100mg  qhs 3. Fall risk due to #1 and #2  - No recent falls  - Again, discussed that if he starts having worsening falls, risks vs benefits of anticoagulation need to be kept in mind  - Strongly encouraged to use gait assist device    PLAN/RECOMMENDATIONS:  1. Continue sinemet 25/100  twice daily 2. Start physical therapy for gait training.  3. Fall precautions discussed and literature provided, encouraged to use cane.  4. Encouraged tight glycemic control to prevent further injury to nerves  5. Return to clinic in 33-months   The duration of this appointment visit was 25 minutes of face-to-face time with the patient.  Greater than 50% of this time was spent in counseling, explanation of diagnosis, planning of further management, and coordination of care.   Thank you for allowing me to participate in patient's care.  If I can answer any additional questions, I would be pleased to do so.    Sincerely,    Myriam Brandhorst K. Posey Pronto, DO

## 2014-04-04 NOTE — Patient Instructions (Signed)
1. Continue sinemet 25/100 twice daily 2. Start physical therapy for gait training.  3. Encouraged to use cane.  4. Encouraged to install hand rails in the bathroom for support 5. Return to clinic in 39-months

## 2014-04-08 ENCOUNTER — Ambulatory Visit (INDEPENDENT_AMBULATORY_CARE_PROVIDER_SITE_OTHER): Payer: Medicare Other | Admitting: General Practice

## 2014-04-08 DIAGNOSIS — Z5181 Encounter for therapeutic drug level monitoring: Secondary | ICD-10-CM

## 2014-04-08 DIAGNOSIS — I4891 Unspecified atrial fibrillation: Secondary | ICD-10-CM

## 2014-04-08 LAB — POCT INR: INR: 3.7

## 2014-04-08 NOTE — Progress Notes (Signed)
Pre visit review using our clinic review tool, if applicable. No additional management support is needed unless otherwise documented below in the visit note. 

## 2014-04-24 ENCOUNTER — Telehealth: Payer: Self-pay | Admitting: Internal Medicine

## 2014-04-24 MED ORDER — METFORMIN HCL 1000 MG PO TABS: 1000.0000 mg | ORAL_TABLET | Freq: Two times a day (BID) | ORAL | Status: DC

## 2014-04-24 NOTE — Telephone Encounter (Signed)
[facility] - [place] [address] Is requesting re-fill on metFORMIN (GLUCOPHAGE) 1000 MG tablet

## 2014-04-25 ENCOUNTER — Ambulatory Visit: Payer: Medicare Other | Attending: Neurology | Admitting: Rehabilitative and Restorative Service Providers"

## 2014-05-06 ENCOUNTER — Ambulatory Visit (INDEPENDENT_AMBULATORY_CARE_PROVIDER_SITE_OTHER): Payer: Medicare Other | Admitting: General Practice

## 2014-05-06 DIAGNOSIS — I4891 Unspecified atrial fibrillation: Secondary | ICD-10-CM

## 2014-05-06 DIAGNOSIS — Z5181 Encounter for therapeutic drug level monitoring: Secondary | ICD-10-CM

## 2014-05-06 LAB — POCT INR: INR: 2.3

## 2014-05-06 NOTE — Progress Notes (Signed)
Pre visit review using our clinic review tool, if applicable. No additional management support is needed unless otherwise documented below in the visit note. 

## 2014-05-16 ENCOUNTER — Encounter: Payer: Self-pay | Admitting: Internal Medicine

## 2014-05-16 ENCOUNTER — Ambulatory Visit (INDEPENDENT_AMBULATORY_CARE_PROVIDER_SITE_OTHER): Payer: Medicare Other | Admitting: Internal Medicine

## 2014-05-16 VITALS — BP 118/60 | HR 61 | Ht 72.0 in | Wt 216.0 lb

## 2014-05-16 DIAGNOSIS — G4733 Obstructive sleep apnea (adult) (pediatric): Secondary | ICD-10-CM

## 2014-05-16 DIAGNOSIS — J439 Emphysema, unspecified: Secondary | ICD-10-CM

## 2014-05-16 DIAGNOSIS — R259 Unspecified abnormal involuntary movements: Secondary | ICD-10-CM

## 2014-05-16 NOTE — Assessment & Plan Note (Signed)
He keeps his weight down and does not seek other intervention

## 2014-05-16 NOTE — Assessment & Plan Note (Signed)
Minimal symptoms not requiring intervention

## 2014-05-16 NOTE — Patient Instructions (Addendum)
I'm glad your breathing issues and sleep now seem to be doing ok. We will be happy to see you again if we can help you.

## 2014-05-16 NOTE — Progress Notes (Signed)
Patient ID: [name] [name], male    DOB: March 15, 1931, 80 y.o.   MRN: [id number]  HPI  04/06/11- He was unable to continue CPAP. It starts out fine but when he gets up for bathroom at night, then comes back to bed, the mask always leaks and he is unable to tolerate. He is still having to get up 2-3 x / night for bathroom. He disconnects hose and keeps mask on. He thinks the mask warms  and softens with body heat. Problems center around his need to get up frequently, but he denies any cardiac events since last here.   05/16/11- OSA  Doesn't feel much daytime sleepiness, still takes naps and enjoys them. We discussed reasons for continuing CPAP. Mask either leaks or is too tight at times. [name] makes face itch. Discussed alternatives.  11/15/11- 75 yoM former smoker followed for OSA  Has had flu vaccine. Had a hypoglycemic episode earlier today but says he is back in control today, having eaten. Denies cough or wheeze but notices some exertional dyspnea, little changed. He has given up on CPAP and feels he sleeps well. He still wakes 3 or 4 times a night for nocturia and this was part of the problem with CPAP. He takes an afternoon nap most days.  05/15/12- 62 yoM former smoker followed for OSA   Patient states doing good. OSA-he failed CPAP but is using an oral appliance he got from his dentist, Dr. Archie [name]. Dry mouth from medications and frequent nocturia him up a lot at night and uses CPAP was too hard to manage. We reviewed his last chest x-ray. There is mild cardiac enlargement. He appears to be some fibrosis or possibly interstitial edema but it does not look like an active progressive condition. He denies cough or change in exercise tolerance. CXR - 02/05/12  IMPRESSION:  Stable chronic lung disease. No acute cardiopulmonary process.  Original Report Authenticated By: Vivia Ewing, M.D.   05/16/13- 33 yoM former smoker followed for OSA/ failed CPAP/ Oral appliance FOLLOWS FOR: states he is  sleeping fine, denies any snoring or breathing issues. Denies any wheezing, cough, or congestion. Has occasional SOB with the simplest jobs-walking, restroom,etc). Biotene helped dry mouth. Little coughing. He did not tolerate CPAP and feels stable and comfortable without it, denying significant snoring or sleepiness. PFT 06/04/2012 mild obstructive airways disease in small airways with insignificant response to bronchodilator, normal lung volumes, diffusion slightly reduced. FVC 3.66/82%, FEV1 2.53/88%, FEV1/FEC 0.69, FEF 25-75% 1.40/59%. RV 113%, TLC 98%, DLCO 72%  05/16/14- 50 yoM former smoker followed for OSA/ failed CPAP/ failed Oral appliance, complicated by AFib, Parkinsons, DM FOLLOWS FOR: Pt states that overall breathing has been doing well since last OV x 1 year ago. No complaints.  Now seeing a neurologist for diagnosis of Parkinson's. Sleep-as an oral appliance. Began making his mouth sore, did not help his sleep and he stopped using it. Breathing-doing well. He denies wheeze or cough. PFT 2013 showed mild obstructive airways disease with insignificant response to bronchodilator. He doesn't feel he has an active problem with this. CXR 05/16/13 IMPRESSION:  No acute findings.  Original Report Authenticated By: Lorin Picket, M.D.  Review of Systems-See HPI Constitutional:   No-   weight loss, night sweats, fevers, chills, fatigue, lassitude. HEENT:   No-  headaches, difficulty swallowing, tooth/dental problems, sore throat,       No-  sneezing, itching, ear ache, nasal congestion, post nasal drip,  CV:  No-  chest pain, orthopnea, PND, swelling in lower extremities, anasarca, dizziness, palpitations Resp: +  shortness of breath with exertion , not at rest.              No-   productive cough,  No non-productive cough,  No- coughing up of blood.              No-   change in color of mucus.  No- wheezing.   Skin: No-   rash or lesions. GI:  No-   heartburn, indigestion, abdominal  pain, nausea, vomiting,  GU:  MS:  No-   joint pain or swelling.  Neuro-    + tremor Psych:  No- change in mood or affect. No depression or anxiety.  No memory loss.   Objective:   Physical Exam General- Alert, Oriented, Affect-appropriate and responsive, Distress- none acute Skin- rash-none, lesions- none, excoriation- none Lymphadenopathy- none Head- atraumatic            Eyes- Gross vision intact, PERRLA, conjunctivae clear secretions            Ears- Hearing, canals-normal            Nose- Clear, no-Septal dev, mucus, polyps, erosion, perforation             Throat- Mallampati II , mucosa very dry , drainage- none, tonsils- atrophic Neck- flexible , trachea midline, no stridor , thyroid nl, carotid no bruit Chest - symmetrical excursion , unlabored           Heart/CV- + IRR , no murmur , no gallop  , no rub, nl s1 s2                           - JVD- none , edema- none, stasis changes- none, varices- none           Lung- clear,  wheeze- none, cough- none , dullness-none, rub- none           Chest wall-  Abd-  Br/ Gen/ Rectal- Not done, not indicated Extrem- cyanosis- none, clubbing, none, atrophy- none, strength- nl, no clubbing. + Cane Neuro- +he holds hands to hide tremor

## 2014-05-16 NOTE — Assessment & Plan Note (Signed)
Managed by neurology

## 2014-05-20 ENCOUNTER — Other Ambulatory Visit: Payer: Self-pay | Admitting: *Deleted

## 2014-05-20 MED ORDER — INSULIN GLARGINE 100 UNIT/ML SOLOSTAR PEN
PEN_INJECTOR | SUBCUTANEOUS | Status: DC
Start: 2014-05-20 — End: 2014-06-10

## 2014-05-22 ENCOUNTER — Telehealth: Payer: Self-pay | Admitting: Internal Medicine

## 2014-05-22 NOTE — Telephone Encounter (Signed)
[facility] - [place] - [address] Is requesting re-fill on diazepam (VALIUM) 2 MG tablet

## 2014-05-26 MED ORDER — DIAZEPAM 2 MG PO TABS: ORAL_TABLET | ORAL | Status: DC

## 2014-05-26 NOTE — Telephone Encounter (Signed)
Rx called in to pharmacy. 

## 2014-06-03 ENCOUNTER — Ambulatory Visit (INDEPENDENT_AMBULATORY_CARE_PROVIDER_SITE_OTHER): Payer: Medicare Other | Admitting: General Practice

## 2014-06-03 DIAGNOSIS — I4891 Unspecified atrial fibrillation: Secondary | ICD-10-CM

## 2014-06-03 DIAGNOSIS — Z5181 Encounter for therapeutic drug level monitoring: Secondary | ICD-10-CM

## 2014-06-03 LAB — POCT INR: INR: 2.7

## 2014-06-03 NOTE — Progress Notes (Signed)
Pre visit review using our clinic review tool, if applicable. No additional management support is needed unless otherwise documented below in the visit note. 

## 2014-06-05 ENCOUNTER — Encounter: Payer: Self-pay | Admitting: Internal Medicine

## 2014-06-05 ENCOUNTER — Ambulatory Visit (INDEPENDENT_AMBULATORY_CARE_PROVIDER_SITE_OTHER): Payer: Medicare Other | Admitting: Internal Medicine

## 2014-06-05 VITALS — BP 110/62 | HR 64 | Temp 98.3°F | Resp 20 | Ht 72.0 in | Wt 217.0 lb

## 2014-06-05 DIAGNOSIS — E039 Hypothyroidism, unspecified: Secondary | ICD-10-CM

## 2014-06-05 DIAGNOSIS — Z23 Encounter for immunization: Secondary | ICD-10-CM

## 2014-06-05 DIAGNOSIS — N183 Chronic kidney disease, stage 3 unspecified: Secondary | ICD-10-CM

## 2014-06-05 DIAGNOSIS — G20 Parkinson's disease: Secondary | ICD-10-CM

## 2014-06-05 DIAGNOSIS — IMO0002 Reserved for concepts with insufficient information to code with codable children: Secondary | ICD-10-CM

## 2014-06-05 DIAGNOSIS — J439 Emphysema, unspecified: Secondary | ICD-10-CM

## 2014-06-05 DIAGNOSIS — I50812 Chronic right heart failure: Secondary | ICD-10-CM

## 2014-06-05 DIAGNOSIS — E1065 Type 1 diabetes mellitus with hyperglycemia: Secondary | ICD-10-CM

## 2014-06-05 DIAGNOSIS — J438 Other emphysema: Secondary | ICD-10-CM

## 2014-06-05 DIAGNOSIS — I4891 Unspecified atrial fibrillation: Secondary | ICD-10-CM

## 2014-06-05 DIAGNOSIS — G4733 Obstructive sleep apnea (adult) (pediatric): Secondary | ICD-10-CM

## 2014-06-05 DIAGNOSIS — I482 Chronic atrial fibrillation, unspecified: Secondary | ICD-10-CM

## 2014-06-05 DIAGNOSIS — I509 Heart failure, unspecified: Secondary | ICD-10-CM

## 2014-06-05 DIAGNOSIS — G20A1 Parkinson's disease without dyskinesia, without mention of fluctuations: Secondary | ICD-10-CM

## 2014-06-05 LAB — COMPREHENSIVE METABOLIC PANEL
ALT: 15 U/L (ref 0–53)
AST: 29 U/L (ref 0–37)
Albumin: 3.9 g/dL (ref 3.5–5.2)
Alkaline Phosphatase: 138 U/L — ABNORMAL HIGH (ref 39–117)
BUN: 59 mg/dL — ABNORMAL HIGH (ref 6–23)
CALCIUM: 9.6 mg/dL (ref 8.4–10.5)
CHLORIDE: 106 meq/L (ref 96–112)
CO2: 32 mEq/L (ref 19–32)
CREATININE: 2.3 mg/dL — AB (ref 0.4–1.5)
GFR: 29.75 mL/min — ABNORMAL LOW (ref >60.00)
Glucose, Bld: 119 mg/dL — ABNORMAL HIGH (ref 70–99)
Potassium: 5.4 mEq/L — ABNORMAL HIGH (ref 3.5–5.1)
Sodium: 142 mEq/L (ref 135–145)
Total Bilirubin: 0.7 mg/dL (ref 0.2–1.2)
Total Protein: 7.6 g/dL (ref 6.0–8.3)

## 2014-06-05 LAB — MICROALBUMIN / CREATININE URINE RATIO
CREATININE, U: 59 mg/dL
MICROALB UR: 4.8 mg/dL — AB (ref 0.0–1.9)
Microalb Creat Ratio: 8.1 mg/g (ref 0.0–30.0)

## 2014-06-05 LAB — CBC WITH DIFFERENTIAL/PLATELET
BASOS PCT: 0.5 % (ref 0.0–3.0)
Basophils Absolute: 0 10*3/uL (ref 0.0–0.1)
EOS ABS: 0.2 10*3/uL (ref 0.0–0.7)
Eosinophils Relative: 2.5 % (ref 0.0–5.0)
HEMATOCRIT: 33.8 % — AB (ref 39.0–52.0)
HEMOGLOBIN: 11.1 g/dL — AB (ref 13.0–17.0)
LYMPHS ABS: 1.3 10*3/uL (ref 0.7–4.0)
Lymphocytes Relative: 18.5 % (ref 12.0–46.0)
MCHC: 32.7 g/dL (ref 30.0–36.0)
MCV: 89.2 fl (ref 78.0–100.0)
MONO ABS: 0.6 10*3/uL (ref 0.1–1.0)
Monocytes Relative: 8.3 % (ref 3.0–12.0)
NEUTROS ABS: 4.9 10*3/uL (ref 1.4–7.7)
Neutrophils Relative %: 70.2 % (ref 43.0–77.0)
Platelets: 157 10*3/uL (ref 150.0–400.0)
RBC: 3.8 Mil/uL — ABNORMAL LOW (ref 4.22–5.81)
RDW: 16.4 % — ABNORMAL HIGH (ref 11.5–15.5)
WBC: 7 10*3/uL (ref 4.0–10.5)

## 2014-06-05 LAB — TSH: TSH: 2.78 u[IU]/mL (ref 0.35–4.50)

## 2014-06-05 LAB — HEMOGLOBIN A1C: Hgb A1c MFr Bld: 7.2 % — ABNORMAL HIGH (ref 4.6–6.5)

## 2014-06-05 NOTE — Progress Notes (Signed)
Subjective:    Patient ID: [name] [name], male    DOB: [birth year], 80 y.o.   MRN: [id number]  HPI 80 year old patient who is seen today to establish with our practice. He has a positive past medical history which includes insulin requiring diabetes.  He is on metformin therapy as well as a bedtime dose of basal insulin.  He has been on 70 units.  His last hemoglobin A1c was greater than 8 He is followed by neurology due to Parkinson's disease. He has a history mild COPD and also a history of pulmonary hypertension and right heart failure. His chronic atrial fibrillation and remains on chronic Coumadin anticoagulation.  He has PAD, obesity, and dyslipidemia.  He has a history of chronic kidney disease. His last INR was 2 point 7.  Past Medical History  Diagnosis Date  . Family history of malignant neoplasm of gastrointestinal tract   . Unspecified constipation   . Abnormal involuntary movements(781.0)   . Vitamin B12 deficiency   . Type II or unspecified type diabetes mellitus with neurological manifestations, not stated as uncontrolled   . Claudication   . Hypertrophy of prostate with urinary obstruction and other lower urinary tract symptoms (LUTS)   . Unspecified sleep apnea     NPSG 05/14/2007- AHI 80.7/ hr  . Obesity   . Mixed hyperlipidemia   . Atrial fibrillation     --myoview 07/2006: not gated. No ischemia or infarct  Diastolic HF-- Echo 05/3015 ER60% mild AI/MR. RV mild to moderately dilated/ dysfunction. ? restrictive CM . RVSP 36  . Diabetes mellitus type 1, uncontrolled   . Bladder polyps   . Diabetes mellitus   . Renal disorder     History   Social History  . Marital Status: Married    Spouse Name: N/A    Number of Children: 3  . Years of Education: 16   Occupational History  . entrepenure    Social History Main Topics  . Smoking status: Former Smoker -- 1.00 packs/day for 35 years    Types: Cigarettes    Quit date: 12/05/1985  . Smokeless tobacco: Never Used   . Alcohol Use: No  . Drug Use: No  . Sexual Activity: Not on file   Other Topics Concern  . Not on file   Social History Narrative   Married '55. 1 son-'64; 3 daughters- '54, '60, '62. 3 grandaughters. 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 Appendectomy    . Kidney stone surgery      Kidney stone retrieval with uretal stent x 2   . Bladder polyps    . Transurethral resection of bladder    . Orif elbow fracture      Family History  Problem Relation Age of Onset  . Lung cancer Mother 69    nonsmoker  . Colon cancer Sister   . Prostate cancer Paternal Uncle   . Stroke Father   . Hip fracture Father   . Healthy Son   . Healthy Daughter     Allergies  Allergen Reactions  . Penicillins     Current Outpatient Prescriptions on File Prior to Visit  Medication Sig Dispense Refill  . acetaminophen (TYLENOL) 500 MG tablet Take 500 mg by mouth every 6 (six) hours as  needed. For pain      . B-D UF III MINI PEN NEEDLES 31G X 5 MM MISC USE AS DIRECTED  100 each  5  . carbidopa-levodopa (SINEMET IR) 25-100 MG per tablet Take 1 tablet by mouth 2 (two) times daily.  60 tablet  5  . diazepam (VALIUM) 2 MG tablet TAKE ONE (1) TABLET BY MOUTH AT BEDTIME FOR INVOLUNTARY MOVEMENT  30 tablet  0  . DIGOX 250 MCG tablet TAKE 1 TABLET BY MOUTH DAILY  30 tablet  5  . furosemide (LASIX) 20 MG tablet TAKE 1 TABLET BY MOUTH ONCE DAILY  90 tablet  3  . Insulin Glargine (LANTUS SOLOSTAR) 100 UNIT/ML Solostar Pen USE 70 UNITS AT BEDTIME AS DIRECTED  10 pen  0  . Insulin Pen Needle (PEN NEEDLES 31GX5/16") 31G X 8 MM MISC 1 each by Does not apply route as directed.  100 each  1  . levothyroxine (SYNTHROID, LEVOTHROID) 100 MCG tablet TAKE 1 TABLET BY MOUTH DAILY  90 tablet  3  . lovastatin (MEVACOR) 20 MG tablet TAKE 1 TABLET BY  MOUTH DAILY  30 tablet  5  . metFORMIN (GLUCOPHAGE) 1000 MG tablet Take 1 tablet (1,000 mg total) by mouth 2 (two) times daily with a meal.  180 tablet  1  . metoprolol (TOPROL-XL) 200 MG 24 hr tablet TAKE ONE (1) TABLET BY MOUTH EVERY DAY  90 tablet  3  . polyethylene glycol (MIRALAX) powder Take 17 g by mouth as needed. For regularity       . sertraline (ZOLOFT) 25 MG tablet Take 1 tablet (25 mg total) by mouth daily.  30 tablet  11  . Tamsulosin HCl (FLOMAX) 0.4 MG CAPS Take by mouth daily after supper.        . trospium (SANCTURA) 20 MG tablet Take 20 mg by mouth 2 (two) times daily.      [name] warfarin (COUMADIN) 5 MG tablet Take as directed by anticoagulation clinic  105 tablet  1   No current facility-administered medications on file prior to visit.    BP 110/62  Pulse 64  Temp(Src) 98.3 F (36.8 C) (Oral)  Resp 20  Ht 6' (1.829 m)  Wt 217 lb (98.431 kg)  BMI 29.42 kg/m2  SpO2 97%        Review of Systems  Constitutional: Negative for fever, chills, appetite change and fatigue.  HENT: Negative for congestion, dental problem, ear pain, hearing loss, sore throat, tinnitus, trouble swallowing and voice change.   Eyes: Negative for pain, discharge and visual disturbance.  Respiratory: Negative for cough, chest tightness, wheezing and stridor.   Cardiovascular: Negative for chest pain, palpitations and leg swelling.  Gastrointestinal: Negative for nausea, vomiting, abdominal pain, diarrhea, constipation, blood in stool and abdominal distention.  Genitourinary: Negative for urgency, hematuria, flank pain, discharge, difficulty urinating and genital sores.  Musculoskeletal: Positive for back pain. Negative for arthralgias, gait problem, joint swelling, myalgias and neck stiffness.  Skin: Negative for rash.  Neurological: Positive for tremors and weakness. Negative for dizziness, syncope, speech difficulty, numbness and headaches.  Hematological: Negative for adenopathy. Does not  bruise/bleed easily.  Psychiatric/Behavioral: Negative for behavioral problems and dysphoric mood. The patient is not nervous/anxious.        Objective:   Physical Exam  Constitutional: He is oriented to person, place, and time. He appears well-developed and well-nourished.  Blood pressure low normal  HENT:  Head: Normocephalic.  Right Ear: External ear normal.  Left Ear:  External ear normal.  Dry mucosal membranes Wax left canal  Eyes: Conjunctivae and EOM are normal.  Neck:  Marked decreased range of motion of the neck  Cardiovascular: Normal rate and normal heart sounds.   Irregular rhythm with a controlled ventricular response  Posterior tibia pulses full.  Dorsalis pedis pulses not easily palpable  Pulmonary/Chest: Effort normal and breath sounds normal. No respiratory distress. He has no wheezes. He has no rales.  Abdominal: Soft. Bowel sounds are normal. He exhibits no distension. There is no tenderness.  Musculoskeletal: Normal range of motion. He exhibits no edema and no tenderness.  Lymphadenopathy:    He has no cervical adenopathy.  Neurological: He is alert and oriented to person, place, and time.  Parkinsonian faces  Skin:  Stasis dermatitis Seborrheic dermatitis with dry, scaly skin about the scalp line forehead and eyes  Psychiatric: He has a normal mood and affect. His behavior is normal.          Assessment & Plan:  Diabetes mellitus.  We'll check a hemoglobin A1c and urine for microalbumin Chronic kidney disease.  We'll check  renal indices.  The patient remains on metformin therapy Chronic atrial fibrillation.  Rate controlled.  Continue chronic anticoagulation Dyslipidemia PD.  Followup neurology  Recheck 3 months

## 2014-06-05 NOTE — Progress Notes (Signed)
Pre visit review using our clinic review tool, if applicable. No additional management support is needed unless otherwise documented below in the visit note. 

## 2014-06-05 NOTE — Patient Instructions (Signed)
Limit your sodium (Salt) intake   Please check your hemoglobin A1c every 3 months   

## 2014-06-10 ENCOUNTER — Other Ambulatory Visit: Payer: Self-pay | Admitting: Internal Medicine

## 2014-06-24 ENCOUNTER — Encounter: Payer: Self-pay | Admitting: Internal Medicine

## 2014-06-24 ENCOUNTER — Ambulatory Visit (INDEPENDENT_AMBULATORY_CARE_PROVIDER_SITE_OTHER): Payer: Medicare Other | Admitting: Internal Medicine

## 2014-06-24 VITALS — BP 100/58 | HR 77 | Temp 97.9°F | Resp 20 | Ht 72.0 in | Wt 219.0 lb

## 2014-06-24 DIAGNOSIS — N183 Chronic kidney disease, stage 3 unspecified: Secondary | ICD-10-CM

## 2014-06-24 DIAGNOSIS — I482 Chronic atrial fibrillation, unspecified: Secondary | ICD-10-CM

## 2014-06-24 DIAGNOSIS — I4891 Unspecified atrial fibrillation: Secondary | ICD-10-CM

## 2014-06-24 DIAGNOSIS — E1149 Type 2 diabetes mellitus with other diabetic neurological complication: Secondary | ICD-10-CM

## 2014-06-24 DIAGNOSIS — E039 Hypothyroidism, unspecified: Secondary | ICD-10-CM

## 2014-06-24 DIAGNOSIS — E782 Mixed hyperlipidemia: Secondary | ICD-10-CM

## 2014-06-24 MED ORDER — FUROSEMIDE 20 MG PO TABS: ORAL_TABLET | ORAL | Status: AC

## 2014-06-24 MED ORDER — METOPROLOL SUCCINATE ER 100 MG PO TB24: ORAL_TABLET | ORAL | Status: DC

## 2014-06-24 NOTE — Patient Instructions (Signed)
Limit your sodium (Salt) intake  Decrease Toprol to 1 half tablet daily (100 mg)  Decrease furosemide to Monday, Wednesday, Friday only  Continue Lantus 70 units at bedtime.  Decrease dose if blood sugars are consistently less than 100   Please check your hemoglobin A1c every 3 months

## 2014-06-24 NOTE — Progress Notes (Signed)
Pre visit review using our clinic review tool, if applicable. No additional management support is needed unless otherwise documented below in the visit note. 

## 2014-06-24 NOTE — Progress Notes (Signed)
Subjective:    Patient ID: [name] [name], male    DOB: [birth year], 80 y.o.   MRN: [id number]  HPI  80 year old patient who is seen today in followup.  He has a history of chronic kidney disease , and creatinine clearance was in the range of 30 cc per hour and both digoxin and metformin therapy have been discontinued.  He has a history of permanent atrial fibrillation Last week.  Blood sugars were occasionally, less than 65, and he had several episodes of mild hypoglycemia, requiring use of glucose supplements.  This week fasting blood sugars are generally in the 160 range He complains of weakness and poor appetite.  He states that he has had a weight loss from 266 to  219 pounds. He complains of anorexia  Wt Readings from Last 3 Encounters:  06/24/14 219 lb (99.338 kg)  06/05/14 217 lb (98.431 kg)  05/16/14 216 lb (97.977 kg)    Past Medical History  Diagnosis Date  . Family history of malignant neoplasm of gastrointestinal tract   . Unspecified constipation   . Abnormal involuntary movements(781.0)   . Vitamin B12 deficiency   . Type II or unspecified type diabetes mellitus with neurological manifestations, not stated as uncontrolled   . Claudication   . Hypertrophy of prostate with urinary obstruction and other lower urinary tract symptoms (LUTS)   . Unspecified sleep apnea     NPSG 05/14/2007- AHI 80.7/ hr  . Obesity   . Mixed hyperlipidemia   . Atrial fibrillation     --myoview 07/2006: not gated. No ischemia or infarct  Diastolic HF-- Echo 03/3153 ER60% mild AI/MR. RV mild to moderately dilated/ dysfunction. ? restrictive CM . RVSP 36  . Diabetes mellitus type 1, uncontrolled   . Bladder polyps   . Diabetes mellitus   . Renal disorder     History   Social History  . Marital Status: Married    Spouse Name: N/A    Number of Children: 3  . Years of Education: 16   Occupational History  . entrepenure    Social History Main Topics  . Smoking status: Former Smoker --  1.00 packs/day for 35 years    Types: Cigarettes    Quit date: 12/05/1985  . Smokeless tobacco: Never Used  . Alcohol Use: No  . Drug Use: No  . Sexual Activity: Not on file   Other Topics Concern  . Not on file   Social History Narrative   Married '55. 1 son-'64; 3 daughters- '54, '60, '62. 3 grandaughters. 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 Appendectomy    . Kidney stone surgery      Kidney stone retrieval with uretal stent x 2   . Bladder polyps    . Transurethral resection of bladder    . Orif elbow fracture      Family History  Problem Relation Age of Onset  . Lung cancer Mother 16    nonsmoker  . Colon cancer Sister   . Prostate cancer Paternal Uncle   . Stroke Father   . Hip fracture Father   . Healthy Son   . Healthy Daughter     Allergies  Allergen Reactions  . Penicillins     Current Outpatient Prescriptions on File  Prior to Visit  Medication Sig Dispense Refill  . acetaminophen (TYLENOL) 500 MG tablet Take 500 mg by mouth every 6 (six) hours as needed. For pain      . B-D UF III MINI PEN NEEDLES 31G X 5 MM MISC USE AS DIRECTED  100 each  5  . carbidopa-levodopa (SINEMET IR) 25-100 MG per tablet Take 1 tablet by mouth 2 (two) times daily.  60 tablet  5  . diazepam (VALIUM) 2 MG tablet TAKE ONE (1) TABLET BY MOUTH AT BEDTIME FOR INVOLUNTARY MOVEMENT  30 tablet  0  . furosemide (LASIX) 20 MG tablet TAKE 1 TABLET BY MOUTH ONCE DAILY  90 tablet  3  . gabapentin (NEURONTIN) 100 MG capsule Take 200 mg by mouth as directed. 1 at bedtime. May increase up to 3 capsules at bedtime      . Insulin Pen Needle (PEN NEEDLES 31GX5/16") 31G X 8 MM MISC 1 each by Does not apply route as directed.  100 each  1  . LANTUS SOLOSTAR 100 UNIT/ML Solostar Pen USE 70 UNITS AT BEDTIME AS DIRECTED  15 mL  2   . levothyroxine (SYNTHROID, LEVOTHROID) 100 MCG tablet TAKE 1 TABLET BY MOUTH DAILY  90 tablet  3  . lovastatin (MEVACOR) 20 MG tablet TAKE 1 TABLET BY MOUTH DAILY  30 tablet  5  . metoprolol (TOPROL-XL) 200 MG 24 hr tablet TAKE ONE (1) TABLET BY MOUTH EVERY DAY  90 tablet  3  . polyethylene glycol (MIRALAX) powder Take 17 g by mouth as needed. For regularity       . sertraline (ZOLOFT) 25 MG tablet Take 1 tablet (25 mg total) by mouth daily.  30 tablet  11  . Tamsulosin HCl (FLOMAX) 0.4 MG CAPS Take by mouth daily after supper.        . trospium (SANCTURA) 20 MG tablet Take 20 mg by mouth 2 (two) times daily.      [name] warfarin (COUMADIN) 5 MG tablet Take as directed by anticoagulation clinic  105 tablet  1   No current facility-administered medications on file prior to visit.    BP 100/58  Pulse 77  Temp(Src) 97.9 F (36.6 C) (Oral)  Resp 20  Ht 6' (1.829 m)  Wt 219 lb (99.338 kg)  BMI 29.70 kg/m2  SpO2 95%     Review of Systems  Constitutional: Positive for appetite change, fatigue and unexpected weight change. Negative for fever and chills.  HENT: Negative for congestion, dental problem, ear pain, hearing loss, sore throat, tinnitus, trouble swallowing and voice change.   Eyes: Negative for pain, discharge and visual disturbance.  Respiratory: Negative for cough, chest tightness, wheezing and stridor.   Cardiovascular: Negative for chest pain, palpitations and leg swelling.  Gastrointestinal: Negative for nausea, vomiting, abdominal pain, diarrhea, constipation, blood in stool and abdominal distention.  Genitourinary: Negative for urgency, hematuria, flank pain, discharge, difficulty urinating and genital sores.  Musculoskeletal: Negative for arthralgias, back pain, gait problem, joint swelling, myalgias and neck stiffness.  Skin: Negative for rash.  Neurological: Negative for dizziness, syncope, speech difficulty, weakness, numbness and headaches.  Hematological: Negative for  adenopathy. Does not bruise/bleed easily.  Psychiatric/Behavioral: Negative for behavioral problems and dysphoric mood. The patient is not nervous/anxious.        Objective:   Physical Exam  Constitutional: He is oriented to person, place, and time. He appears well-developed.  Blood pressure 100/58  HENT:  Head: Normocephalic.  Right Ear: External ear normal.  Left Ear: External ear normal.  Eyes: Conjunctivae and EOM are normal.  Neck: Normal range of motion.  Cardiovascular: Normal rate and normal heart sounds.   Controlled ventricular response  Pulmonary/Chest: Breath sounds normal.  Abdominal: Bowel sounds are normal.  Musculoskeletal: Normal range of motion. He exhibits no edema and no tenderness.  Neurological: He is alert and oriented to person, place, and time.  Psychiatric: He has a normal mood and affect. His behavior is normal.          Assessment & Plan:

## 2014-07-01 ENCOUNTER — Ambulatory Visit (INDEPENDENT_AMBULATORY_CARE_PROVIDER_SITE_OTHER): Payer: Medicare Other | Admitting: General Practice

## 2014-07-01 DIAGNOSIS — Z5181 Encounter for therapeutic drug level monitoring: Secondary | ICD-10-CM

## 2014-07-01 DIAGNOSIS — I4891 Unspecified atrial fibrillation: Secondary | ICD-10-CM

## 2014-07-01 LAB — POCT INR: INR: 4.7

## 2014-07-01 NOTE — Progress Notes (Signed)
Pre visit review using our clinic review tool, if applicable. No additional management support is needed unless otherwise documented below in the visit note. 

## 2014-07-02 ENCOUNTER — Other Ambulatory Visit: Payer: Self-pay | Admitting: Internal Medicine

## 2014-07-07 ENCOUNTER — Encounter: Payer: Self-pay | Admitting: Neurology

## 2014-07-07 ENCOUNTER — Ambulatory Visit (INDEPENDENT_AMBULATORY_CARE_PROVIDER_SITE_OTHER): Payer: Medicare Other | Admitting: Neurology

## 2014-07-07 VITALS — BP 110/78 | HR 82 | Ht 72.0 in | Wt 220.0 lb

## 2014-07-07 DIAGNOSIS — E0849 Diabetes mellitus due to underlying condition with other diabetic neurological complication: Secondary | ICD-10-CM

## 2014-07-07 DIAGNOSIS — G20A1 Parkinson's disease without dyskinesia, without mention of fluctuations: Secondary | ICD-10-CM

## 2014-07-07 DIAGNOSIS — G20 Parkinson's disease: Secondary | ICD-10-CM

## 2014-07-07 DIAGNOSIS — E1349 Other specified diabetes mellitus with other diabetic neurological complication: Secondary | ICD-10-CM

## 2014-07-07 MED ORDER — CARBIDOPA-LEVODOPA 25-100 MG PO TABS: 1.0000 | ORAL_TABLET | Freq: Two times a day (BID) | ORAL | Status: DC

## 2014-07-07 NOTE — Patient Instructions (Addendum)
1.  Continue sinemet 25/100 and take 1 tablet twice daily 2.  If balance or walking gets worse, contact my office so we can start therapy 3.  Continue to use cane for gait support 4.  Return to clinic in 96-months

## 2014-07-07 NOTE — Progress Notes (Signed)
Follow-up Visit   Date: 07/07/2014    [name] MRN: [id number] DOB: 1931-06-09   Interim History: [name] [name] is a 80 y.o. right-handed Caucasian male with history of diabetes mellitus (on insulin HbA1c 8.2), hyperlipidemia, atrial fibrillation (on coumadin), BPH, and depression returning to the clinic for follow-up of recently diagnosed parkinson disease.  The patient was accompanied to the clinic by self. He was last seen in the clinic 04/04/2014.  History of present illness: Starting early 2014, he noticed mild "internal twitching", described as muscle jerking inside and occasionally outside, but this worsened in January 2015 because his hands started shaking. Tremors involve the hands (L >R) and occur mostly at rest, but can interfere with fine motor tasks such as eating, putting on buttons, etc. He has not noticed anything that makes it better, but if he concentrates on the shakes, it will get better. He has fallen twice in the past 77-months. The first fall occurred while tripping over water hoses and the second fall occurred while reaching forward to pick up something on the floor. He has trouble standing upright in the shower and feels that he staggers backwards into the walls of the shower. He endorses that his movements have become slower over the years. Denies any muscle stiffness.   He endorses chronic constipation and vivid dreams. He gets startled easily especially when he is waking up. He has never been able to smell odors and complains to his wife that food does not taste the same. His wife has told him his voice has become more soft like "an old folks". He endorses morning hallucinations, usually unfamiliar people standing in his room. His mood is "about the same as usual". Sleep has been interrupted over the past few months, so he wakes up and watches TV and ends up napping during the day (1-3 hours/day), but denies feeling of restlessness. He has trouble "getting his balance" in  the morning, but once he is up, he is ok. Memory has always been poor. He is able to do his own ADLs, but has to be very careful and take his time. He is still driving and has not been involved in any MVA, but he has to concentrate so has considerably cut down on driving.   He has "internal shakes" of his abdomen which has resolved after taking valium 2mg . Of note, his father had similar symptoms late in his life.  - Follow-up 04/04/2014: He reports having significant improvement of tremors and "internal shakes" after starting sinemet.  Fine motor movements are much easier and he seems happy with the medication effect.    - UPDATE 07/07/2014:  I recommended that he start PT at the last visit, but he did not go due to conflicts in his schedule.  He has not had any falls.  His continues to find significant benefit with tremors after starting sinemet and does not notice any wearing off periods.  He endorses intermittent lightheadedness, especially with positional changes. His denies any problems with swallowing or talking.  Mood is good.   His blood sugars have been lower lately and his PCP has been adjusting the medications.  This morning it was 45 and he felt a little weak.  He had breakfast afterwards, which helped.   Medications:  Current Outpatient Prescriptions on File Prior to Visit  Medication Sig Dispense Refill  . acetaminophen (TYLENOL) 500 MG tablet Take 500 mg by mouth every 6 (six) hours as needed. For pain      .  B-D UF III MINI PEN NEEDLES 31G X 5 MM MISC USE AS DIRECTED  100 each  5  . carbidopa-levodopa (SINEMET IR) 25-100 MG per tablet Take 1 tablet by mouth 2 (two) times daily.  60 tablet  5  . diazepam (VALIUM) 2 MG tablet TAKE 1 TABLET BY MOUTH EVERY NIGHT AT BEDTIME  30 tablet  2  . furosemide (LASIX) 20 MG tablet 1 tablet Monday, Wednesday, Friday only  90 tablet  3  . gabapentin (NEURONTIN) 100 MG capsule Take 200 mg by mouth as directed. 1 at bedtime. May increase up to 3  capsules at bedtime      . Insulin Pen Needle (PEN NEEDLES 31GX5/16") 31G X 8 MM MISC 1 each by Does not apply route as directed.  100 each  1  . LANTUS SOLOSTAR 100 UNIT/ML Solostar Pen USE 70 UNITS AT BEDTIME AS DIRECTED  15 mL  2  . levothyroxine (SYNTHROID, LEVOTHROID) 100 MCG tablet TAKE 1 TABLET BY MOUTH DAILY  90 tablet  3  . lovastatin (MEVACOR) 20 MG tablet TAKE 1 TABLET BY MOUTH DAILY  30 tablet  5  . metoprolol (TOPROL-XL) 100 MG 24 hr tablet 1 half tablet daily  90 tablet  3  . polyethylene glycol (MIRALAX) powder Take 17 g by mouth as needed. For regularity       . Tamsulosin HCl (FLOMAX) 0.4 MG CAPS Take by mouth daily after supper.        . trospium (SANCTURA) 20 MG tablet Take 20 mg by mouth 2 (two) times daily.      [name] warfarin (COUMADIN) 5 MG tablet Take as directed by anticoagulation clinic  105 tablet  1  . sertraline (ZOLOFT) 25 MG tablet Take 1 tablet (25 mg total) by mouth daily.  30 tablet  11   No current facility-administered medications on file prior to visit.    Allergies:  Allergies  Allergen Reactions  . Penicillins      Review of Systems:  CONSTITUTIONAL: No fevers, chills, night sweats, or weight loss.   EYES: No visual changes or eye pain ENT: No hearing changes.  No history of nose bleeds.   RESPIRATORY: No cough, wheezing and shortness of breath.   CARDIOVASCULAR: Negative for chest pain, and palpitations.   GI: Negative for abdominal discomfort, blood in stools or black stools.  No recent change in bowel habits.   GU:  No history of incontinence.   MUSCLOSKELETAL: No history of joint pain or swelling.  No myalgias.   SKIN: Negative for lesions, rash, and itching.   ENDOCRINE: Negative for cold or heat intolerance, polydipsia or goiter.   PSYCH:  No depression or anxiety symptoms.   NEURO: As Above.   Vital Signs:  BP 110/78  Pulse 82  Ht 6' (1.829 m)  Wt 220 lb (99.791 kg)  BMI 29.83 kg/m2  SpO2 95%   Neurological Exam: MENTAL STATUS  including orientation to time, place, person, recent and remote memory, attention span and concentration, language, and fund of knowledge is moderately intact.  Affect is blunted, but he smiles appropriately.  Speech is mildly soft, no dysarthria.  CRANIAL NERVES: Pupils equal round and reactive to light.  Upward gaze is slightly restricted bilaterally, otherwise normal conjugate, extra-ocular eye movements.  Mild bilateral ptosis. Face is symmetric. Palate elevates symmetrically.  Tongue is midline.  MOTOR:  Motor strength is 5/5 in all extremities, except toe flexors and toe extensors are 5-/5 bilaterally.  Tone is normal.  Intermittent resting hand tremor (L>R, improved).  MSRs:  Reflexes are 2+/4 in the upper extremities, absent in the lower extremities bilaterally.  SENSORY: Vibration is reduced distal to knees.  Romberg's absent.  COORDINATION/GAIT:  Finger tapping shows preserved rate and amplitide with only subtle decrement seen on the left side. Toe tapping is slowed on the left side. He is unable to rise from a chair without using arms. Stooped posture, small steps with poor arm swing bilaterally, left hand tremor is more noticeable.    Data: Lab Results  Component Value Date   HGBA1C 7.2* 06/05/2014   Lab Results  Component Value Date   VITAMINB12 >1500* 11/12/2013   Lab Results  Component Value Date   TSH 2.78 06/05/2014     IMPRESSION: 1. 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 Distal and symmetric peripheral neuropathy, most likely due to diabetes  - Painful paresthesias controlled on neurontin 100mg  qhs 3. Fall risk due to #1 and #2  - No recent falls  - If he starts having worsening falls, risks vs benefits of anticoagulation need to be kept in mind  - Continue to use cane/gait assist  device   PLAN/RECOMMENDATIONS:  1. Refills provided for sinemet 25/100 twice daily.   2. Patient would like to hold off on physical therapy at this time 3. Fall precautions discussed 4. No driving due to dementia 5. Return to clinic in 3-6 months   The duration of this appointment visit was 25 minutes of face-to-face time with the patient.  Greater than 50% of this time was spent in counseling, explanation of diagnosis, planning of further management, and coordination of care.   Thank you for allowing me to participate in patient's care.  If I can answer any additional questions, I would be pleased to do so.    Sincerely,    Donika K. Posey Pronto, DO

## 2014-07-10 ENCOUNTER — Telehealth: Payer: Self-pay | Admitting: Internal Medicine

## 2014-07-10 MED ORDER — ONETOUCH ULTRASOFT LANCETS MISC: 1.0000 | Freq: Every day | Status: DC | PRN

## 2014-07-10 MED ORDER — GLUCOSE BLOOD VI STRP: 1.0000 | ORAL_STRIP | Freq: Every day | Status: DC | PRN

## 2014-07-10 NOTE — Telephone Encounter (Signed)
Spoke to pt , asked him how often testing? Pt stated once a day. Told pt okay will send Rx's to pharmacy. Pt verbalized understanding.

## 2014-07-10 NOTE — Telephone Encounter (Signed)
Pt called to req rx on Ultra Trak test strips and 1 touch lantcets    Pharmacy ; [facility]   Phone number [phone]

## 2014-07-15 ENCOUNTER — Ambulatory Visit (INDEPENDENT_AMBULATORY_CARE_PROVIDER_SITE_OTHER): Payer: Medicare Other | Admitting: Family Medicine

## 2014-07-15 DIAGNOSIS — Z5181 Encounter for therapeutic drug level monitoring: Secondary | ICD-10-CM

## 2014-07-15 DIAGNOSIS — I4891 Unspecified atrial fibrillation: Secondary | ICD-10-CM

## 2014-07-15 LAB — POCT INR: INR: 2.2

## 2014-07-24 ENCOUNTER — Telehealth: Payer: Self-pay | Admitting: Internal Medicine

## 2014-07-24 MED ORDER — LOVASTATIN 20 MG PO TABS: ORAL_TABLET | ORAL | Status: DC

## 2014-07-24 NOTE — Telephone Encounter (Signed)
Rx sent 

## 2014-07-24 NOTE — Telephone Encounter (Signed)
[facility] - [place] [address] Is requesting re-fill on lovastatin (MEVACOR) 20 MG tablet

## 2014-08-05 ENCOUNTER — Encounter: Payer: Self-pay | Admitting: Internal Medicine

## 2014-08-05 ENCOUNTER — Ambulatory Visit (INDEPENDENT_AMBULATORY_CARE_PROVIDER_SITE_OTHER): Payer: Medicare Other | Admitting: Internal Medicine

## 2014-08-05 VITALS — BP 109/71 | HR 83 | Temp 97.4°F | Resp 20 | Ht 72.0 in | Wt 227.0 lb

## 2014-08-05 DIAGNOSIS — G20 Parkinson's disease: Secondary | ICD-10-CM

## 2014-08-05 DIAGNOSIS — R0789 Other chest pain: Secondary | ICD-10-CM

## 2014-08-05 DIAGNOSIS — R071 Chest pain on breathing: Secondary | ICD-10-CM

## 2014-08-05 MED ORDER — HYDROCODONE-ACETAMINOPHEN 5-325 MG PO TABS: 1.0000 | ORAL_TABLET | Freq: Four times a day (QID) | ORAL | Status: DC | PRN

## 2014-08-05 NOTE — Progress Notes (Signed)
Pre visit review using our clinic review tool, if applicable. No additional management support is needed unless otherwise documented below in the visit note. 

## 2014-08-05 NOTE — Progress Notes (Signed)
Subjective:    Patient ID: [name] [name], male    DOB: Mar 08, 1931, 80 y.o.   MRN: [id number]  HPI 80 year old patient who has Parkinson's disease and unsteady gait.  He fell last night and presents today complaining of right chest wall and right shoulder pain.  No loss of consciousness.  Past Medical History  Diagnosis Date  . Family history of malignant neoplasm of gastrointestinal tract   . Unspecified constipation   . Abnormal involuntary movements(781.0)   . Vitamin B12 deficiency   . Type II or unspecified type diabetes mellitus with neurological manifestations, not stated as uncontrolled   . Claudication   . Hypertrophy of prostate with urinary obstruction and other lower urinary tract symptoms (LUTS)   . Unspecified sleep apnea     NPSG 05/14/2007- AHI 80.7/ hr  . Obesity   . Mixed hyperlipidemia   . Atrial fibrillation     --myoview 07/2006: not gated. No ischemia or infarct  Diastolic HF-- Echo 08/3715 ER60% mild AI/MR. RV mild to moderately dilated/ dysfunction. ? restrictive CM . RVSP 36  . Diabetes mellitus type 1, uncontrolled   . Bladder polyps   . Diabetes mellitus   . Renal disorder     History   Social History  . Marital Status: Married    Spouse Name: N/A    Number of Children: 3  . Years of Education: 16   Occupational History  . entrepenure    Social History Main Topics  . Smoking status: Former Smoker -- 1.00 packs/day for 35 years    Types: Cigarettes    Quit date: 12/05/1985  . Smokeless tobacco: Never Used  . Alcohol Use: No  . Drug Use: No  . Sexual Activity: Not on file   Other Topics Concern  . Not on file   Social History Narrative   Married '55. 1 son-'64; 3 daughters- '54, '60, '62. 3 grandaughters. 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 Appendectomy    .  Kidney stone surgery      Kidney stone retrieval with uretal stent x 2   . Bladder polyps    . Transurethral resection of bladder    . Orif elbow fracture      Family History  Problem Relation Age of Onset  . Lung cancer Mother 35    nonsmoker  . Colon cancer Sister   . Prostate cancer Paternal Uncle   . Stroke Father   . Hip fracture Father   . Healthy Son   . Healthy Daughter     Allergies  Allergen Reactions  . Penicillins     Current Outpatient Prescriptions on File Prior to Visit  Medication Sig Dispense Refill  . acetaminophen (TYLENOL) 500 MG tablet Take 500 mg by mouth every 6 (six) hours as needed. For pain      . B-D UF III MINI PEN NEEDLES 31G X 5 MM MISC USE AS DIRECTED  100 each  5  . carbidopa-levodopa (SINEMET IR) 25-100 MG per tablet Take 1 tablet by mouth 2 (two) times daily.  180 tablet  3  . diazepam (VALIUM) 2 MG tablet TAKE 1 TABLET BY MOUTH EVERY NIGHT AT BEDTIME  30 tablet  2  . furosemide (LASIX) 20 MG tablet 1 tablet Monday,  Wednesday, Friday only  90 tablet  3  . gabapentin (NEURONTIN) 100 MG capsule Take 200 mg by mouth as directed. 1 at bedtime. May increase up to 3 capsules at bedtime      . glucose blood test strip 1 each by Other route daily as needed for other.  100 each  12  . Insulin Pen Needle (PEN NEEDLES 31GX5/16") 31G X 8 MM MISC 1 each by Does not apply route as directed.  100 each  1  . Lancets (ONETOUCH ULTRASOFT) lancets 1 each by Other route daily as needed for other.  100 each  12  . LANTUS SOLOSTAR 100 UNIT/ML Solostar Pen USE 70 UNITS AT BEDTIME AS DIRECTED  15 mL  2  . levothyroxine (SYNTHROID, LEVOTHROID) 100 MCG tablet TAKE 1 TABLET BY MOUTH DAILY  90 tablet  3  . lovastatin (MEVACOR) 20 MG tablet TAKE 1 TABLET BY MOUTH DAILY  30 tablet  5  . metoprolol (TOPROL-XL) 100 MG 24 hr tablet 1 half tablet daily  90 tablet  3  . polyethylene glycol (MIRALAX) powder Take 17 g by mouth as needed. For regularity       . sertraline  (ZOLOFT) 25 MG tablet Take 1 tablet (25 mg total) by mouth daily.  30 tablet  11  . Tamsulosin HCl (FLOMAX) 0.4 MG CAPS Take by mouth daily after supper.        . trospium (SANCTURA) 20 MG tablet Take 20 mg by mouth 2 (two) times daily.      [name] warfarin (COUMADIN) 5 MG tablet Take as directed by anticoagulation clinic  105 tablet  1   No current facility-administered medications on file prior to visit.    BP 109/71  Pulse 83  Temp(Src) 97.4 F (36.3 C) (Oral)  Resp 20  Ht 6' (1.829 m)  Wt 227 lb (102.967 kg)  BMI 30.78 kg/m2  SpO2 96%      Review of Systems  Constitutional: Negative for fever, chills, appetite change and fatigue.  HENT: Negative for congestion, dental problem, ear pain, hearing loss, sore throat, tinnitus, trouble swallowing and voice change.   Eyes: Negative for pain, discharge and visual disturbance.  Respiratory: Negative for cough, chest tightness, wheezing and stridor.   Cardiovascular: Positive for chest pain. Negative for palpitations and leg swelling.  Gastrointestinal: Negative for nausea, vomiting, abdominal pain, diarrhea, constipation, blood in stool and abdominal distention.  Genitourinary: Negative for urgency, hematuria, flank pain, discharge, difficulty urinating and genital sores.  Musculoskeletal: Positive for arthralgias and gait problem. Negative for back pain, joint swelling, myalgias and neck stiffness.  Skin: Negative for rash.  Neurological: Negative for dizziness, syncope, speech difficulty, weakness, numbness and headaches.  Hematological: Negative for adenopathy. Does not bruise/bleed easily.  Psychiatric/Behavioral: Negative for behavioral problems and dysphoric mood. The patient is not nervous/anxious.        Objective:   Physical Exam  Constitutional: He appears well-developed and well-nourished. No distress.  Pulmonary/Chest:  Bibasilar rales Minimal diminished breath sounds at the right base due to splinting  Musculoskeletal:   Tenderness over the anterior aspect of the right shoulder.  No bruising.  Range of motion limited due to pain Considerable tenderness over the right chest wall area.  No bruising          Assessment & Plan:   Traumatic right shoulder and right chest wall pain.  Options discussed.  We'll hold off on radiographs at this time and treat symptomatically.  Hopefully, no serious shoulder  pathology, such as rotator cuff tear, may need further evaluation if unimproved over the next 2-3 days

## 2014-08-05 NOTE — Patient Instructions (Signed)
Call or return to clinic prn if these symptoms worsen or fail to improve as anticipated.

## 2014-08-12 ENCOUNTER — Ambulatory Visit (INDEPENDENT_AMBULATORY_CARE_PROVIDER_SITE_OTHER): Payer: Medicare Other | Admitting: *Deleted

## 2014-08-12 DIAGNOSIS — Z23 Encounter for immunization: Secondary | ICD-10-CM

## 2014-08-12 DIAGNOSIS — Z5181 Encounter for therapeutic drug level monitoring: Secondary | ICD-10-CM

## 2014-08-12 DIAGNOSIS — I4891 Unspecified atrial fibrillation: Secondary | ICD-10-CM

## 2014-08-12 LAB — POCT INR: INR: 3.1

## 2014-08-15 ENCOUNTER — Telehealth: Payer: Self-pay | Admitting: *Deleted

## 2014-08-15 NOTE — Telephone Encounter (Signed)
I can reassess him in the clinic to determine if he needs medication changes vs PT.  I would also encourage him to f/u with his cardiologist to determine if he needs to stay on anticoagulation because of fall risk.  Cleatus Gabriel K. Posey Pronto, DO

## 2014-08-15 NOTE — Telephone Encounter (Signed)
Will you call patient and set up an appointment please?

## 2014-08-15 NOTE — Telephone Encounter (Signed)
Pt resch to 08-26-14 thanks

## 2014-08-15 NOTE — Telephone Encounter (Signed)
Patient would like for you to know that he has had several fall in the past few months, please adviseCall back # [phone]

## 2014-08-18 ENCOUNTER — Other Ambulatory Visit: Payer: Self-pay | Admitting: Internal Medicine

## 2014-08-26 ENCOUNTER — Encounter: Payer: Self-pay | Admitting: Neurology

## 2014-08-26 ENCOUNTER — Ambulatory Visit (INDEPENDENT_AMBULATORY_CARE_PROVIDER_SITE_OTHER): Payer: Medicare Other | Admitting: Neurology

## 2014-08-26 VITALS — BP 110/60 | HR 84 | Ht 72.0 in | Wt 212.6 lb

## 2014-08-26 DIAGNOSIS — G20A1 Parkinson's disease without dyskinesia, without mention of fluctuations: Secondary | ICD-10-CM

## 2014-08-26 DIAGNOSIS — G20 Parkinson's disease: Secondary | ICD-10-CM

## 2014-08-26 DIAGNOSIS — E1149 Type 2 diabetes mellitus with other diabetic neurological complication: Secondary | ICD-10-CM

## 2014-08-26 DIAGNOSIS — Z7189 Other specified counseling: Secondary | ICD-10-CM

## 2014-08-26 NOTE — Addendum Note (Signed)
Addended by: Chester Holstein on: 08/26/2014 10:28 AM   Modules accepted: Orders
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
## 2014-08-26 NOTE — Patient Instructions (Addendum)
1.  Continue sinemet 25/100 twice daily 2.  Start physical therapy for gait training 3.  Information on Advanced Directives provided 4.  Return to clinic at scheduled appointment in November   Life Alert Resources  Medical Alert Systems that can locate patients outside the home:  http://mobilealertsystems.com/ (973)365-9376 http://www.lifelinesys.com/content/lifeline-products/get-life-gosafe  413 679 2750 www.verizonwireless.com/sure/ 202-071-1005  Medial Alert systems that link to smart phones:  http://www.lifelinesys.com/content/lifeline-products/response-app https://www.stanton.info/ RecycleRoad.pl.aspx  [employer] GPS Tracker:  VoiceZap.is (916)442-1040

## 2014-09-10 ENCOUNTER — Ambulatory Visit (INDEPENDENT_AMBULATORY_CARE_PROVIDER_SITE_OTHER): Payer: Medicare Other | Admitting: *Deleted

## 2014-09-10 DIAGNOSIS — Z5181 Encounter for therapeutic drug level monitoring: Secondary | ICD-10-CM

## 2014-09-10 DIAGNOSIS — I4891 Unspecified atrial fibrillation: Secondary | ICD-10-CM

## 2014-09-10 LAB — POCT INR: INR: 3

## 2014-09-11 ENCOUNTER — Ambulatory Visit (INDEPENDENT_AMBULATORY_CARE_PROVIDER_SITE_OTHER): Payer: Medicare Other | Admitting: Internal Medicine

## 2014-09-11 ENCOUNTER — Encounter: Payer: Self-pay | Admitting: Internal Medicine

## 2014-09-11 VITALS — BP 114/66 | Temp 97.6°F | Ht 72.0 in | Wt 223.0 lb

## 2014-09-11 DIAGNOSIS — I482 Chronic atrial fibrillation, unspecified: Secondary | ICD-10-CM

## 2014-09-11 DIAGNOSIS — N183 Chronic kidney disease, stage 3 unspecified: Secondary | ICD-10-CM

## 2014-09-11 DIAGNOSIS — E1065 Type 1 diabetes mellitus with hyperglycemia: Secondary | ICD-10-CM

## 2014-09-11 DIAGNOSIS — IMO0002 Reserved for concepts with insufficient information to code with codable children: Secondary | ICD-10-CM

## 2014-09-11 LAB — BASIC METABOLIC PANEL
BUN: 76 mg/dL — ABNORMAL HIGH (ref 6–23)
CHLORIDE: 105 meq/L (ref 96–112)
CO2: 31 mEq/L (ref 19–32)
Calcium: 9.9 mg/dL (ref 8.4–10.5)
Creatinine, Ser: 2.6 mg/dL — ABNORMAL HIGH (ref 0.4–1.5)
GFR: 25.18 mL/min — ABNORMAL LOW (ref >60.00)
GLUCOSE: 83 mg/dL (ref 70–99)
POTASSIUM: 5.2 meq/L — AB (ref 3.5–5.1)
Sodium: 143 mEq/L (ref 135–145)

## 2014-09-11 LAB — HEMOGLOBIN A1C: Hgb A1c MFr Bld: 6.6 % — ABNORMAL HIGH (ref 4.6–6.5)

## 2014-09-11 NOTE — Patient Instructions (Signed)
Please check your hemoglobin A1c every 3 months
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
## 2014-09-11 NOTE — Progress Notes (Signed)
Pre visit review using our clinic review tool, if applicable. No additional management support is needed unless otherwise documented below in the visit note. 

## 2014-09-23 ENCOUNTER — Telehealth: Payer: Self-pay | Admitting: Internal Medicine

## 2014-09-23 ENCOUNTER — Ambulatory Visit: Payer: Self-pay | Admitting: Family Medicine

## 2014-09-23 ENCOUNTER — Other Ambulatory Visit: Payer: Self-pay | Admitting: Internal Medicine

## 2014-09-23 NOTE — Telephone Encounter (Signed)
Patient Information:  Caller Name: Arleigh  Phone: [phone]  Patient: [name] [name], [name] [name]  Gender: Male  DOB: 06/20/1931  Age: 80 Years  PCP: Bluford Kaufmann (Family Practice > 13yrs old)  Office Follow Up:  Does the office need to follow up with this patient?: No  Instructions For The Office: N/A  RN Note:  Patient states he was recently diagnosed with Parkinson's Disease. Patient states he developed increased tremors of his hands and jerking and twitching of the muscles in his abdomen, onset 09/21/14. Patient states he was seen by Dr. Patel/Neurology, per referral. Patient states he called Dr. Serita Grit office but is not able to be seen. Patient states Dr. Serita Grit office advised him to be seen by Dr. Burnice Logan 09/23/14. Patient denies abdominal pain. Afebrile. Urinating normally for patient. Triage per Tremor Protocol. No emergent sx identified.  Disposition of " See Provider within 72 hours" obtained related to positive triage assessment for                   " Diagnosed with Parkinson's and tremor suddenly worsening. " Care advice given per guidelines. Patient advised to change positions slowly. with assistance. Patient advised to avoid caffeine. Patient advised not to drive. Call back parameters reviewed. Patient verbalizes understanding. No appts. available with Dr.  Burnice Logan.   Symptoms  Reason For Call & Symptoms: Twitching and Jerking of the muscles in his abdomen  Reviewed Health History In EMR: Yes  Reviewed Medications In EMR: Yes  Reviewed Allergies In EMR: Yes  Reviewed Surgeries / Procedures: Yes  Date of Onset of Symptoms: 09/21/2014  Guideline(s) Used:  No Protocol Available - Sick Adult  Disposition Per Guideline:   See Today in Office  Reason For Disposition Reached:   Nursing judgment  Advice Given:  Call Back If:  New symptoms develop  You become worse.  Patient Will Follow Care Advice:  YES  Appointment Scheduled:  09/23/2014  11:30:00 Appointment Scheduled Provider:  Alysia Penna [facility])

## 2014-09-23 NOTE — Telephone Encounter (Signed)
Noted  

## 2014-09-24 ENCOUNTER — Ambulatory Visit (INDEPENDENT_AMBULATORY_CARE_PROVIDER_SITE_OTHER): Payer: Medicare Other | Admitting: Internal Medicine

## 2014-09-24 ENCOUNTER — Encounter: Payer: Self-pay | Admitting: Internal Medicine

## 2014-09-24 ENCOUNTER — Encounter: Payer: Self-pay | Admitting: *Deleted

## 2014-09-24 VITALS — BP 118/70 | HR 88 | Temp 97.7°F | Resp 20 | Ht 72.0 in | Wt 225.0 lb

## 2014-09-24 DIAGNOSIS — G20 Parkinson's disease: Secondary | ICD-10-CM

## 2014-09-24 MED ORDER — DIAZEPAM 2 MG PO TABS: ORAL_TABLET | ORAL | Status: DC

## 2014-09-24 MED ORDER — DIAZEPAM 2 MG PO TABS: 2.0000 mg | ORAL_TABLET | Freq: Two times a day (BID) | ORAL | Status: DC | PRN

## 2014-09-24 NOTE — Progress Notes (Signed)
Pre visit review using our clinic review tool, if applicable. No additional management support is needed unless otherwise documented below in the visit note. 
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
## 2014-09-24 NOTE — Patient Instructions (Signed)
Increase diazepam to twice daily as needed Neurology followup as scheduled  Start physical therapy as scheduled

## 2014-09-29 ENCOUNTER — Telehealth: Payer: Self-pay | Admitting: Internal Medicine

## 2014-09-29 ENCOUNTER — Other Ambulatory Visit: Payer: Self-pay | Admitting: Internal Medicine

## 2014-09-29 MED ORDER — WARFARIN SODIUM 5 MG PO TABS: ORAL_TABLET | ORAL | Status: DC

## 2014-09-29 NOTE — Telephone Encounter (Signed)
Pt needs re-fill on warfarin (COUMADIN) 5 MG tablet. [facility] - [place] - [address]  Pt is out.

## 2014-09-29 NOTE — Telephone Encounter (Signed)
Done

## 2014-10-02 ENCOUNTER — Other Ambulatory Visit: Payer: Self-pay | Admitting: Internal Medicine

## 2014-10-07 ENCOUNTER — Encounter: Payer: Self-pay | Admitting: Neurology

## 2014-10-07 ENCOUNTER — Ambulatory Visit (INDEPENDENT_AMBULATORY_CARE_PROVIDER_SITE_OTHER): Payer: Medicare Other | Admitting: Neurology

## 2014-10-07 VITALS — BP 110/68 | HR 71 | Ht 72.0 in | Wt 224.6 lb

## 2014-10-07 DIAGNOSIS — G20 Parkinson's disease: Secondary | ICD-10-CM

## 2014-10-07 DIAGNOSIS — E0849 Diabetes mellitus due to underlying condition with other diabetic neurological complication: Secondary | ICD-10-CM

## 2014-10-07 DIAGNOSIS — Z7189 Other specified counseling: Secondary | ICD-10-CM

## 2014-10-07 DIAGNOSIS — R269 Unspecified abnormalities of gait and mobility: Secondary | ICD-10-CM
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
## 2014-10-07 NOTE — Patient Instructions (Addendum)
1.  Reduce gabapentin to 1 tablet at bedtime only.  Stop taking morning dose.  2.  Continue sinemet 25/100 twice daily 3.  Consider using rollator, if walking becomes more problematic 4.  Start physical therapy for gait training and balance 5.  Return to clinic in 69-months

## 2014-10-08 ENCOUNTER — Ambulatory Visit (INDEPENDENT_AMBULATORY_CARE_PROVIDER_SITE_OTHER): Payer: Medicare Other | Admitting: Family

## 2014-10-08 ENCOUNTER — Telehealth: Payer: Self-pay | Admitting: Internal Medicine

## 2014-10-08 DIAGNOSIS — Z5181 Encounter for therapeutic drug level monitoring: Secondary | ICD-10-CM

## 2014-10-08 DIAGNOSIS — I4891 Unspecified atrial fibrillation: Secondary | ICD-10-CM

## 2014-10-08 LAB — POCT INR: INR: 3.1

## 2014-10-08 MED ORDER — DIAZEPAM 2 MG PO TABS: 2.0000 mg | ORAL_TABLET | Freq: Two times a day (BID) | ORAL | Status: DC | PRN

## 2014-10-08 NOTE — Telephone Encounter (Signed)
Pt states rx diazepam (VALIUM) 2 MG tablet  Is not at the pharm . Pharm states they have been waiting 3 wks to hear from [place].   Can you resend?  thanks [facility]

## 2014-10-08 NOTE — Telephone Encounter (Signed)
Pt notified Rx was called into the pharmacy.

## 2014-10-08 NOTE — Patient Instructions (Signed)
Take 1/2 tab today only then Continue to take 1 tablet daily except 1/2 tablet on Tuesdays/Fridays. Re-check in 4 weeks.   Anticoagulation Dose Instructions as of 10/08/2014      [name] [name] Tue Wed Thu Fri Sat   New Dose 5 mg 5 mg 2.5 mg 5 mg 5 mg 2.5 mg 5 mg    Description        Take 1/2 tab today only then Continue to take 1 tablet daily except 1/2 tablet on Tuesdays/Fridays. Re-check in 4 weeks.

## 2014-10-13 ENCOUNTER — Telehealth: Payer: Self-pay | Admitting: *Deleted

## 2014-10-13 NOTE — Telephone Encounter (Signed)
Patient has not been set up with PT yet.  They will call him to set up the appointment and then we will see if PT recommends the chair.  I will call patient and let him know.

## 2014-10-13 NOTE — Telephone Encounter (Signed)
I have.  They will call him to set up appt.

## 2014-10-13 NOTE — Telephone Encounter (Signed)
Having trouble getting in and out chair patient would like to have a Rx for a raising recliner chair please advise Call back# [phone]

## 2014-10-13 NOTE — Telephone Encounter (Signed)
Noted.  Cauy Melody K. Herberta Pickron, DO   

## 2014-10-13 NOTE — Telephone Encounter (Signed)
Ok to order 

## 2014-10-13 NOTE — Telephone Encounter (Signed)
I'm not sure what this is.  Did PT schedule an appointment with him?  If so, is this what was recommended by physical therapy?  I don't mind prescribing something for assistance, but want to be sure its the appropriate equipment.  Please be sure that PT is seeing patient and have discussed options that would make it easier for him to get out of chairs easier.  If raising recliner chair is PT's recommendation, ok to give Rx.  Kevionna Heffler K. Posey Pronto, DO

## 2014-10-16 ENCOUNTER — Telehealth: Payer: Self-pay | Admitting: Internal Medicine

## 2014-10-16 MED ORDER — INSULIN GLARGINE 100 UNIT/ML SOLOSTAR PEN: PEN_INJECTOR | SUBCUTANEOUS | Status: DC

## 2014-10-16 NOTE — Telephone Encounter (Signed)
Rx sent 

## 2014-10-16 NOTE — Telephone Encounter (Signed)
[facility] - [place] [place] - [address]. Is requesting re-fill on LANTUS SOLOSTAR 100 UNIT/ML Solostar Pen

## 2014-10-24 ENCOUNTER — Telehealth: Payer: Self-pay | Admitting: *Deleted

## 2014-10-24 NOTE — Telephone Encounter (Signed)
-----   Message from Alda Berthold, DO sent at 10/20/2014  4:32 PM EST ----- Regarding: f/u with PT Please call and be sure PT appts are scheduled.  Thanks, [employer]

## 2014-10-24 NOTE — Telephone Encounter (Signed)
Patient is scheduled for PT on December 1.  I called patient to make sure that he is aware of the appointment.

## 2014-11-04 ENCOUNTER — Ambulatory Visit: Payer: Medicare Other | Attending: Neurology | Admitting: Physical Therapy

## 2014-11-04 ENCOUNTER — Encounter: Payer: Self-pay | Admitting: Physical Therapy

## 2014-11-04 DIAGNOSIS — R269 Unspecified abnormalities of gait and mobility: Secondary | ICD-10-CM

## 2014-11-04 NOTE — Therapy (Signed)
Physical Therapy Evaluation  Patient Details  Name: [name] [name] MRN: [id number] Date of Birth: Jan 10, 1931  Encounter Date: 11/04/2014      PT End of Session - 11/04/14 1308    Visit Number 1  G1   Number of Visits 17   Date for PT Re-Evaluation 01/04/15   PT Start Time 1151   PT Stop Time 1230   PT Time Calculation (min) 39 min      Past Medical History  Diagnosis Date  . Family history of malignant neoplasm of gastrointestinal tract   . Unspecified constipation   . Abnormal involuntary movements(781.0)   . Vitamin B12 deficiency   . Type II or unspecified type diabetes mellitus with neurological manifestations, not stated as uncontrolled   . Claudication   . Hypertrophy of prostate with urinary obstruction and other lower urinary tract symptoms (LUTS)   . Unspecified sleep apnea     NPSG 05/14/2007- AHI 80.7/ hr  . Obesity   . Mixed hyperlipidemia   . Atrial fibrillation     --myoview 07/2006: not gated. No ischemia or infarct  Diastolic HF-- Echo 07/2504 ER60% mild AI/MR. RV mild to moderately dilated/ dysfunction. ? restrictive CM . RVSP 36  . Diabetes mellitus type 1, uncontrolled   . Bladder polyps   . Diabetes mellitus   . Renal disorder     Past Surgical History  Procedure Laterality Date  . Appendectomy    . Kidney stone surgery      Kidney stone retrieval with uretal stent x 2   . Bladder polyps    . Transurethral resection of bladder    . Orif elbow fracture      There were no vitals taken for this visit.  Visit Diagnosis:  Abnormality of gait - Plan: PT plan of care cert/re-cert      Subjective Assessment - 11/04/14 1159    Symptoms Pt is a 80 year old male who presents to OP PT with PD since 05/2014.  He reports initiatlly having internal twitching episodes, which were resolved with Sinemet.  Pt has had several month history of balance difficulty and several falls.  Pt ambulates with cane; has walker at home, which he uses occasionally.  Pt reports  increased difficulty with transfers   Pertinent History PD diagnosis in 05/2014   Patient Stated Goals Pt's goal for therapy is to have more strength in legs to get in and out of chair easier.   Currently in Pain? Yes   Pain Score 3    Pain Location Shoulder  PT to monitor pain, but will not address as a goal at this time, as it is beyond scope of evaluation today.   Pain Orientation Right   Pain Descriptors / Indicators --  aggravating pain   Pain Type Chronic pain   Pain Onset More than a month ago  Pain since fall approximately 6 months ago   Pain Frequency Intermittent   Aggravating Factors  Sleeping positions in recliner and leaning onto shoulder aggravate pain.   Pain Relieving Factors Tyenol alleviates pain.          [facility] PT Assessment - 11/04/14 1205    Assessment   Medical Diagnosis Parkinson's disease   Onset Date --  June 2015   Precautions   Precautions Fall   Balance Screen   Has the patient fallen in the past 6 months Yes   How many times? 6   Has the patient had a decrease in activity level  because of a fear of falling?  Yes   Is the patient reluctant to leave their home because of a fear of falling?  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   Observation/Other Assessments   Focus on Therapeutic Outcomes (FOTO)  Functional Status Intake score 40  Neuro QOL Intake score 36.1   Posture/Postural Control   Posture/Postural Control Postural limitations   Postural Limitations Rounded  Shoulders;Forward head   Strength   Overall Strength Deficits   Right Hip Flexion 5/5   Left Hip Flexion 5/5   Right Knee Flexion 3+/5   Right Knee Extension 4/5   Left Knee Flexion 4/5   Left Knee Extension 4/5   Right Ankle Dorsiflexion 3/5   Left Ankle Dorsiflexion 3/5   Transfers   Transfers Sit to Stand;Stand to Sit   Sit to Stand 5: Supervision  requires UE support; unable to perform 5x sit<.stand   Stand to Sit 5: Supervision;With upper extremity assist  uncontrolled descent into sitting   Ambulation/Gait   Ambulation/Gait Yes   Ambulation/Gait Assistance 5: Supervision   Ambulation Distance (Feet) 150 Feet   Assistive device Straight cane   Gait Pattern Trunk flexed;Wide base of support  decr. foot clearance/decr. heelstrike, looks down at ground   Gait velocity 17.80 sec= 1.84 ft/sec  back 11.91 sec (0.84 f/tsec)   Standardized Balance Assessment   Standardized Balance Assessment Timed Up and Go Test   Timed Up and Go Test   TUG Normal TUG;Cognitive TUG;Manual TUG   Normal TUG (seconds) 26.03   Cognitive TUG (seconds) 30.06              PT Short Term Goals - 11/04/14 1312    PT SHORT TERM GOAL #1   Title perform HEP with family's supervision, for improved transfers and balance.   Time 4   Period Weeks   Status New   PT SHORT TERM GOAL #2   Title perform at least 8 of 10 reps of sit<>stand transfers with minimal UE support from 18 inch surfaces or below, for improved transfer efficiency and safety.   Time 4   Period Weeks   Status New   PT SHORT TERM GOAL #3   Title improve TUG score to less than or equal to 20 seconds for decreased fall risk.   Time 4   Period Weeks   Status New   PT SHORT TERM GOAL #4   Title improve score on Berg balance test by at least 5 points for decreased fall risk.   Time 4   Period Weeks   Status New   PT SHORT TERM GOAL #5   Title verbalize understanding of local Parkinson's disease related resources.   Time 4    Period Weeks   Status New          PT Long Term Goals - 11/04/14 1314    PT LONG TERM GOAL #1   Title verbalize understanding of fall prevention techniques within the home environment.  Time 8   Period Weeks   Status New   PT LONG TERM GOAL #2   Title perform at least 8 of 10 reps of sit<>stand transfers with no UE support, for improved transfer safety and efficiency.   Time 8   Period Weeks   Status New   PT LONG TERM GOAL #3   Title improve TUG cognitive score to less than or equal to 28 seconds for decreased fall risk.   Time 8   Period Weeks   Status New   PT LONG TERM GOAL #4   Title improve gait velocity to at least 2.3 ft/sec for improved gait efficiency and safety.   Time 8   Period Weeks   Status New   PT LONG TERM GOAL #5   Title verbalize plans for continued community fitness upon D/C from PT.   Time 8   Period Weeks   Status New          Plan - Dec 04, 2014 1308    Clinical Impression Statement Pt is an 80 year old male who presents to OP PT with Parkinson's disease, who presents with history of internal tremors, UE tremors, postural instability, decreased balance, decreased timing and coordination of gait.  Pt has had at least 6 falls in the past 6 months, with one fall resulting in rib fractures and continued R shoulder pain.   Pt will benefit from skilled therapeutic intervention in order to improve on the following deficits Abnormal gait;Decreased balance;Decreased mobility;Difficulty walking;Decreased strength;Postural dysfunction   Rehab Potential Good   PT Frequency 2x / week   PT Duration 8 weeks   PT Treatment/Interventions ADLs/Self Care Home Management;Gait training;Stair training;Functional mobility training;Therapeutic activities;Neuromuscular re-education;Balance training;Therapeutic exercise;Patient/family education   PT Next Visit Plan perform Berg Balance test; initiate HEP   PT Home Exercise Plan HEP to include sit<>stand transfers, seated and  standing PWR! Moves exercises   Consulted and Agree with Plan of Care Patient;Family member/caregiver          G-Codes - 04-Dec-2014 1312    Functional Assessment Tool Used TUG, TUG cognitive, gait velocity, 6 falls in 6 months   Functional Limitation Mobility: Walking and moving around   Mobility: Walking and Moving Around Current Status [phone]) At least 40 percent but less than 60 percent impaired, limited or restricted   Mobility: Walking and Moving Around Goal Status [phone]) At least 20 percent but less than 40 percent impaired, limited or restricted      Problem List Patient Active Problem List   Diagnosis Date Noted  . Encounter for therapeutic drug monitoring 01/07/2014  . CKD (chronic kidney disease) stage 3, GFR 30-59 ml/min 11/19/2013  . COPD with emphysema, mild 05/29/2013  . ED (erectile dysfunction) 11/18/2012  . Encounter for long-term (current) use of anticoagulants 01/30/2012  . Chronic right-sided HF (heart failure) 04/29/2011  . PULMONARY HYPERTENSION 01/10/2011  . ABNORMAL ECHOCARDIOGRAM 10/15/2009  . TINEA PEDIS 10/13/2009  . Unspecified hypothyroidism 10/13/2009  . CONSTIPATION 01/26/2009  . Parkinson's disease 11/13/2008  . VITAMIN B12 DEFICIENCY 10/09/2008  . DIAB W/NEURO MANIFESTS TYPE II/UNS NOT UNCNTRL 09/26/2008  . CLAUDICATION 09/09/2008  . DYSEQUILIBRIUM 02/26/2008  . BENIGN PROSTATIC HYPERTROPHY, WITH URINARY OBSTRUCTION 10/12/2007  . HYPERLIPIDEMIA, MIXED 10/08/2007  . OBESITY 10/08/2007  . Atrial fibrillation 10/08/2007  . Obstructive sleep apnea 10/08/2007  . Uncontrolled type 1 diabetes mellitus 08/24/2007  Cherika Jessie W. 11/04/2014, 2:20 PM      Wahid Holley, PT 11/04/2014 2:21 PM Phone: [phone] Fax: [phone]

## 2014-11-05 ENCOUNTER — Ambulatory Visit (INDEPENDENT_AMBULATORY_CARE_PROVIDER_SITE_OTHER): Payer: Medicare Other

## 2014-11-05 ENCOUNTER — Telehealth: Payer: Self-pay | Admitting: Family

## 2014-11-05 DIAGNOSIS — Z5181 Encounter for therapeutic drug level monitoring: Secondary | ICD-10-CM

## 2014-11-05 DIAGNOSIS — I482 Chronic atrial fibrillation, unspecified: Secondary | ICD-10-CM

## 2014-11-05 LAB — POCT INR: INR: 3.7

## 2014-11-05 NOTE — Telephone Encounter (Signed)
Agree with plan 

## 2014-11-06 ENCOUNTER — Ambulatory Visit: Payer: Medicare Other | Admitting: Physical Therapy

## 2014-11-06 ENCOUNTER — Encounter: Payer: Self-pay | Admitting: Physical Therapy

## 2014-11-06 DIAGNOSIS — R269 Unspecified abnormalities of gait and mobility: Secondary | ICD-10-CM

## 2014-11-06 NOTE — Therapy (Signed)
[facility] [address] [place], [place], [address] Phone: [phone]   Fax:  [phone]  Physical Therapy Treatment  Patient Details  Name: [name] [name] MRN: [id number] Date of Birth: 1931/05/30  Encounter Date: 11/06/2014      PT End of Session - 11/06/14 1155    Visit Number 2  G2   Number of Visits 17   Date for PT Re-Evaluation 01/04/15   PT Start Time 1107   PT Stop Time 1145   PT Time Calculation (min) 38 min   Equipment Utilized During Treatment Gait belt   Activity Tolerance Patient tolerated treatment well      Past Medical History  Diagnosis Date  . Family history of malignant neoplasm of gastrointestinal tract   . Unspecified constipation   . Abnormal involuntary movements(781.0)   . Vitamin B12 deficiency   . Type II or unspecified type diabetes mellitus with neurological manifestations, not stated as uncontrolled   . Claudication   . Hypertrophy of prostate with urinary obstruction and other lower urinary tract symptoms (LUTS)   . Unspecified sleep apnea     NPSG 05/14/2007- AHI 80.7/ hr  . Obesity   . Mixed hyperlipidemia   . Atrial fibrillation     --myoview 07/2006: not gated. No ischemia or infarct  Diastolic HF-- Echo 05/2693 ER60% mild AI/MR. RV mild to moderately dilated/ dysfunction. ? restrictive CM . RVSP 36  . Diabetes mellitus type 1, uncontrolled   . Bladder polyps   . Diabetes mellitus   . Renal disorder     Past Surgical History  Procedure Laterality Date  . Appendectomy    . Kidney stone surgery      Kidney stone retrieval with uretal stent x 2   . Bladder polyps    . Transurethral resection of bladder    . Orif elbow fracture      There were no vitals taken for this visit.  Visit Diagnosis:  Abnormality of gait      Subjective Assessment - 11/06/14 1109    Symptoms No new changes since evaluation the other day.   Currently in Pain? Yes   Pain Score 3    Pain Location  Shoulder   Pain Orientation Right   Pain Type Chronic pain   Pain Onset More than a month ago   Pain Frequency Intermittent   Aggravating Factors  Sleeping positions in recliner and leaning onto shoulder aggravate pain.   Pain Relieving Factors Tylenol alleviates pain.            [facility] PT Treatment/Exercise - 11/06/14 1112    Transfers   Transfers Sit to Stand   Sit to Stand 5: Supervision;From chair/3-in-1;From elevated surface  from 22 in. mat surface x 8; from 16 in chair x1 min assist   Sit to Stand Details (indicate cue type and reason) Cues for sit<>stand technique, large amplitude, deliberate movement patterns   Stand to Sit 5: Supervision   Standardized Balance Assessment   Standardized Balance Assessment Berg Balance Test  Berg score 35/56 (scores <45/56 indicate incr. fall risk)   Berg Balance Test   Sit to Stand Able to stand  independently using hands   Standing Unsupported Able to stand safely 2 minutes   Sitting with Back Unsupported but Feet Supported on Floor or Stool Able to sit safely and securely 2 minutes   Stand to Sit Controls descent by using hands   Transfers Able to transfer safely, definite need of  hands   Standing Unsupported with Eyes Closed Able to stand 10 seconds with supervision   Standing Ubsupported with Feet Together Able to place feet together independently but unable to hold for 30 seconds   From Standing, Reach Forward with Outstretched Arm Can reach forward >12 cm safely (5")  7 inches   From Standing Position, Pick up Object from Floor Able to pick up shoe, needs supervision   From Standing Position, Turn to Look Behind Over each Shoulder Needs supervision when turning   Turn 360 Degrees Able to turn 360 degrees safely but slowly  7.5 seconds   Standing Unsupported, Alternately Place Feet on Step/Stool Able to complete >2 steps/needs minimal assist   Standing Unsupported, One Foot in Front Able to take small step independently and  hold 30 seconds   Standing on One Leg Tries to lift leg/unable to hold 3 seconds but remains standing independently   Total Score 35          PT Education - 11/06/14 1154    Education provided Yes   Education Details Educated in HEP, discussed results of Berg Balance score, possible use of RW vs cane for improved safety with gait   Person(s) Educated Patient;Spouse   Methods Explanation;Demonstration;Handout   Comprehension Verbalized understanding;Returned demonstration;Verbal cues required;Tactile cues required              Plan - 11/06/14 1155    Clinical Impression Statement HEP initiated for PWR! Moves in sitting to address posture, weightshifting, transition step with large amplitude, as well as sit<>stand transfers.  Pt at fall risk per [facility] score of 35/56.     Pt will benefit from skilled therapeutic intervention in order to improve on the following deficits Abnormal gait;Decreased balance;Decreased mobility;Difficulty walking;Decreased strength;Postural dysfunction   Rehab Potential Good   PT Frequency 2x / week   PT Duration 8 weeks   PT Treatment/Interventions ADLs/Self Care Home Management;Gait training;Stair training;Functional mobility training;Therapeutic activities;Neuromuscular re-education;Balance training;Therapeutic exercise;Patient/family education   PT Next Visit Plan Review HEP, add standing PWR! Moves and dynamic balance activities at counter; walking program with RW vs cane   Consulted and Agree with Plan of Care Patient;Family member/caregiver                      [facility]) - 11/06/14 1133    PWR! exercises Moves in sitting   PWR! Up 10   PWR! Rock 10   PWR! Twist --  Not performed due to shoulder pain   PWR! Step 10   Comments initial verbal and visual cues given                 Problem List Patient Active Problem List   Diagnosis Date Noted  . Encounter for therapeutic drug monitoring 01/07/2014  . CKD  (chronic kidney disease) stage 3, GFR 30-59 ml/min 11/19/2013  . COPD with emphysema, mild 05/29/2013  . ED (erectile dysfunction) 11/18/2012  . Encounter for long-term (current) use of anticoagulants 01/30/2012  . Chronic right-sided HF (heart failure) 04/29/2011  . PULMONARY HYPERTENSION 01/10/2011  . ABNORMAL ECHOCARDIOGRAM 10/15/2009  . TINEA PEDIS 10/13/2009  . Unspecified hypothyroidism 10/13/2009  . CONSTIPATION 01/26/2009  . Parkinson's disease 11/13/2008  . VITAMIN B12 DEFICIENCY 10/09/2008  . DIAB W/NEURO MANIFESTS TYPE II/UNS NOT UNCNTRL 09/26/2008  . CLAUDICATION 09/09/2008  . DYSEQUILIBRIUM 02/26/2008  . BENIGN PROSTATIC HYPERTROPHY, WITH URINARY OBSTRUCTION 10/12/2007  . HYPERLIPIDEMIA, MIXED 10/08/2007  . OBESITY 10/08/2007  . Atrial fibrillation 10/08/2007  .  Obstructive sleep apnea 10/08/2007  . Uncontrolled type 1 diabetes mellitus 08/24/2007    Zamariya Neal W. 11/06/2014, 11:58 AM     Mady Haagensen, PT 11/06/2014 11:59 AM Phone: [phone] Fax: [phone]

## 2014-11-06 NOTE — Patient Instructions (Addendum)
Sit to Stand Transfers:  1. Scoot out to the edge of the chair 2. Place your feet flat on the floor, shoulder width apart.  Make sure your feet are tucked just under your knees. 3. Lean forward (nose over toes) with momentum, and stand up tall with your best posture.  If you need to use your arms, use them as a quick boost up to stand. 4. If you are in a low or soft chair, you can lean back and then forward up to stand, in order to get more momentum. 5. Once you are standing, make sure you are looking ahead and standing tall.  To sit down:  1. Back up until you feel the chair behind your legs. 2. Bend at you hips, reaching  Back for you chair, if needed, then slowly squat to sit down on your chair.   PWR! Moves provided in sitting position:  PWR! Up x 10 reps, PWR! Rock x 10, [place]! Step x 10

## 2014-11-10 ENCOUNTER — Telehealth: Payer: Self-pay | Admitting: Internal Medicine

## 2014-11-10 ENCOUNTER — Telehealth: Payer: Self-pay | Admitting: Neurology

## 2014-11-10 NOTE — Telephone Encounter (Signed)
Please call patient

## 2014-11-10 NOTE — Telephone Encounter (Signed)
Called patient back and got voicemail.  Left message for him to call me back.

## 2014-11-10 NOTE — Telephone Encounter (Signed)
Patient called and he agreed to call his cardiologist

## 2014-11-10 NOTE — Telephone Encounter (Signed)
tient Information:  Caller Name: Grabiel  Phone: [phone]  Patient: [name] [name], [name] [name]  Gender: Male  DOB: 30-Aug-1931  Age: 80 Years  PCP: Bluford Kaufmann (Family Practice > 39yrs old)  Office Follow Up:  Does the office need to follow up with this patient?: No  Instructions For The Office: N/A   Symptoms  Reason For Call & Symptoms: Pt has a hx of atrial fibrillation. Last night he developed palpitations and SOB. He has been like this all night. Pt "speaking in short sentences" d/t to SOB. He denies chest pain or palpitations at this time.  Reviewed Health History In EMR: Yes  Reviewed Medications In EMR: Yes  Reviewed Allergies In EMR: Yes  Reviewed Surgeries / Procedures: Yes  Date of Onset of Symptoms: 11/09/2014  Guideline(s) Used:  Breathing Difficulty  Disposition Per Guideline:   Go to ED Now.  Reason For Disposition Reached:   Moderate difficulty breathing (e.g., speaks in phrases, SOB even at rest, pulse 100-120) of new onset or worse than normal  Advice Given:  N/A  Patient Will Follow Care Advice:  YES

## 2014-11-10 NOTE — Telephone Encounter (Signed)
FYI

## 2014-11-10 NOTE — Telephone Encounter (Signed)
He should also let his cardiologist know.

## 2014-11-10 NOTE — Telephone Encounter (Signed)
Called patient back again since I had not heard from him yet.  He said that he was having trouble breathing last night from about 2am until 6am and he could feel his heart beating.  He called his PCP today and he was not in but his nurse instructed him to go to the ER if this happens again.  I told him that I would let you know what is going on but I also strongly advised him to go to the ER also if this happens again.  Patient agreed.

## 2014-11-10 NOTE — Telephone Encounter (Signed)
Pt needs to talk to you about what is going on with him please call [phone]

## 2014-11-11 ENCOUNTER — Ambulatory Visit: Payer: Medicare Other | Admitting: Physical Therapy

## 2014-11-12 ENCOUNTER — Inpatient Hospital Stay (HOSPITAL_COMMUNITY)
Admission: EM | Admit: 2014-11-12 | Discharge: 2014-11-15 | DRG: 187 | Disposition: A | Discharge status: Home / Self Care | Payer: Medicare Other | Attending: Internal Medicine | Admitting: Internal Medicine

## 2014-11-12 ENCOUNTER — Emergency Department (HOSPITAL_COMMUNITY): Payer: Medicare Other

## 2014-11-12 ENCOUNTER — Encounter (HOSPITAL_COMMUNITY): Payer: Self-pay

## 2014-11-12 DIAGNOSIS — R0602 Shortness of breath: Secondary | ICD-10-CM

## 2014-11-12 DIAGNOSIS — E1149 Type 2 diabetes mellitus with other diabetic neurological complication: Secondary | ICD-10-CM | POA: Diagnosis present

## 2014-11-12 DIAGNOSIS — J438 Other emphysema: Secondary | ICD-10-CM

## 2014-11-12 DIAGNOSIS — N401 Enlarged prostate with lower urinary tract symptoms: Secondary | ICD-10-CM | POA: Diagnosis present

## 2014-11-12 DIAGNOSIS — Z87891 Personal history of nicotine dependence: Secondary | ICD-10-CM | POA: Diagnosis not present

## 2014-11-12 DIAGNOSIS — N184 Chronic kidney disease, stage 4 (severe): Secondary | ICD-10-CM | POA: Diagnosis present

## 2014-11-12 DIAGNOSIS — I50812 Chronic right heart failure: Secondary | ICD-10-CM

## 2014-11-12 DIAGNOSIS — E039 Hypothyroidism, unspecified: Secondary | ICD-10-CM | POA: Diagnosis present

## 2014-11-12 DIAGNOSIS — J90 Pleural effusion, not elsewhere classified: Secondary | ICD-10-CM | POA: Diagnosis present

## 2014-11-12 DIAGNOSIS — R748 Abnormal levels of other serum enzymes: Secondary | ICD-10-CM | POA: Diagnosis present

## 2014-11-12 DIAGNOSIS — I739 Peripheral vascular disease, unspecified: Secondary | ICD-10-CM | POA: Diagnosis present

## 2014-11-12 DIAGNOSIS — N183 Chronic kidney disease, stage 3 (moderate): Secondary | ICD-10-CM

## 2014-11-12 DIAGNOSIS — I4891 Unspecified atrial fibrillation: Secondary | ICD-10-CM | POA: Diagnosis present

## 2014-11-12 DIAGNOSIS — G4733 Obstructive sleep apnea (adult) (pediatric): Secondary | ICD-10-CM | POA: Diagnosis present

## 2014-11-12 DIAGNOSIS — J9819 Other pulmonary collapse: Secondary | ICD-10-CM | POA: Diagnosis present

## 2014-11-12 DIAGNOSIS — Z794 Long term (current) use of insulin: Secondary | ICD-10-CM | POA: Diagnosis not present

## 2014-11-12 DIAGNOSIS — I509 Heart failure, unspecified: Secondary | ICD-10-CM | POA: Diagnosis present

## 2014-11-12 DIAGNOSIS — J439 Emphysema, unspecified: Secondary | ICD-10-CM | POA: Diagnosis present

## 2014-11-12 DIAGNOSIS — Z79899 Other long term (current) drug therapy: Secondary | ICD-10-CM

## 2014-11-12 DIAGNOSIS — Z7901 Long term (current) use of anticoagulants: Secondary | ICD-10-CM | POA: Diagnosis not present

## 2014-11-12 DIAGNOSIS — E782 Mixed hyperlipidemia: Secondary | ICD-10-CM | POA: Diagnosis present

## 2014-11-12 DIAGNOSIS — Z9049 Acquired absence of other specified parts of digestive tract: Secondary | ICD-10-CM | POA: Diagnosis present

## 2014-11-12 DIAGNOSIS — J449 Chronic obstructive pulmonary disease, unspecified: Secondary | ICD-10-CM | POA: Diagnosis present

## 2014-11-12 DIAGNOSIS — E038 Other specified hypothyroidism: Secondary | ICD-10-CM

## 2014-11-12 DIAGNOSIS — Z66 Do not resuscitate: Secondary | ICD-10-CM | POA: Diagnosis present

## 2014-11-12 DIAGNOSIS — G20 Parkinson's disease: Secondary | ICD-10-CM | POA: Diagnosis present

## 2014-11-12 DIAGNOSIS — I482 Chronic atrial fibrillation: Secondary | ICD-10-CM

## 2014-11-12 DIAGNOSIS — Z880 Allergy status to penicillin: Secondary | ICD-10-CM | POA: Diagnosis not present

## 2014-11-12 HISTORY — DX: Pleural effusion, not elsewhere classified: J90

## 2014-11-12 HISTORY — DX: Personal history of other diseases of the digestive system: Z87.19

## 2014-11-12 HISTORY — DX: Cerebral infarction, unspecified: I63.9

## 2014-11-12 HISTORY — DX: Headache, unspecified: R51.9

## 2014-11-12 HISTORY — DX: Headache: R51

## 2014-11-12 HISTORY — DX: Calculus of kidney: N20.0

## 2014-11-12 HISTORY — DX: Hypothyroidism, unspecified: E03.9

## 2014-11-12 HISTORY — DX: Obstructive sleep apnea (adult) (pediatric): G47.33

## 2014-11-12 HISTORY — DX: Unspecified osteoarthritis, unspecified site: M19.90

## 2014-11-12 LAB — CBC WITH DIFFERENTIAL/PLATELET
Basophils Absolute: 0 10*3/uL (ref 0.0–0.1)
Basophils Relative: 0 % (ref 0–1)
Eosinophils Absolute: 0.2 10*3/uL (ref 0.0–0.7)
Eosinophils Relative: 3 % (ref 0–5)
HCT: 35.8 % — ABNORMAL LOW (ref 39.0–52.0)
HEMOGLOBIN: 11.3 g/dL — AB (ref 13.0–17.0)
Lymphocytes Relative: 24 % (ref 12–46)
Lymphs Abs: 1.6 10*3/uL (ref 0.7–4.0)
MCH: 29.5 pg (ref 26.0–34.0)
MCHC: 31.6 g/dL (ref 30.0–36.0)
MCV: 93.5 fL (ref 78.0–100.0)
MONOS PCT: 9 % (ref 3–12)
Monocytes Absolute: 0.6 10*3/uL (ref 0.1–1.0)
NEUTROS ABS: 4.3 10*3/uL (ref 1.7–7.7)
Neutrophils Relative %: 64 % (ref 43–77)
PLATELETS: 204 10*3/uL (ref 150–400)
RBC: 3.83 MIL/uL — AB (ref 4.22–5.81)
RDW: 15.7 % — ABNORMAL HIGH (ref 11.5–15.5)
WBC: 6.8 10*3/uL (ref 4.0–10.5)

## 2014-11-12 LAB — I-STAT TROPONIN, ED: TROPONIN I, POC: 0.01 ng/mL (ref 0.00–0.08)

## 2014-11-12 LAB — COMPREHENSIVE METABOLIC PANEL
ALK PHOS: 278 U/L — AB (ref 39–117)
ALT: 19 U/L (ref 0–53)
ANION GAP: 14 (ref 5–15)
AST: 39 U/L — ABNORMAL HIGH (ref 0–37)
Albumin: 3.6 g/dL (ref 3.5–5.2)
BUN: 86 mg/dL — AB (ref 6–23)
CALCIUM: 10.5 mg/dL (ref 8.4–10.5)
CHLORIDE: 106 meq/L (ref 96–112)
CO2: 28 meq/L (ref 19–32)
Creatinine, Ser: 2.33 mg/dL — ABNORMAL HIGH (ref 0.50–1.35)
GFR calc non Af Amer: 25 mL/min — ABNORMAL LOW (ref >90)
GFR, EST AFRICAN AMERICAN: 29 mL/min — AB (ref >90)
Glucose, Bld: 94 mg/dL (ref 70–99)
POTASSIUM: 5 meq/L (ref 3.7–5.3)
SODIUM: 148 meq/L — AB (ref 137–147)
Total Bilirubin: 0.4 mg/dL (ref 0.3–1.2)
Total Protein: 7.9 g/dL (ref 6.0–8.3)

## 2014-11-12 LAB — GLUCOSE, CAPILLARY
GLUCOSE-CAPILLARY: 115 mg/dL — AB (ref 70–99)
Glucose-Capillary: 135 mg/dL — ABNORMAL HIGH (ref 70–99)

## 2014-11-12 LAB — PROTIME-INR
INR: 2.49 — ABNORMAL HIGH (ref 0.00–1.49)
Prothrombin Time: 27.2 seconds — ABNORMAL HIGH (ref 11.6–15.2)

## 2014-11-12 LAB — GAMMA GT: GGT: 149 U/L — AB (ref 7–51)

## 2014-11-12 LAB — PRO B NATRIURETIC PEPTIDE: Pro B Natriuretic peptide (BNP): 440.4 pg/mL (ref 0–450)

## 2014-11-12 LAB — LACTATE DEHYDROGENASE: LDH: 145 U/L (ref 94–250)

## 2014-11-12 MED ORDER — GABAPENTIN 100 MG PO CAPS
200.0000 mg | ORAL_CAPSULE | Freq: Every day | ORAL | Status: DC
  Administered 2014-11-12 – 2014-11-14 (×3): 200 mg via ORAL
  Filled 2014-11-12 (×5): qty 2

## 2014-11-12 MED ORDER — DEXTROSE 5 % IV SOLN
1.0000 g | INTRAVENOUS | Status: DC
  Administered 2014-11-12 – 2014-11-13 (×2): 1 g via INTRAVENOUS
  Filled 2014-11-12 (×3): qty 1

## 2014-11-12 MED ORDER — INSULIN GLARGINE 100 UNIT/ML ~~LOC~~ SOLN
50.0000 [IU] | Freq: Every day | SUBCUTANEOUS | Status: DC
  Administered 2014-11-12 – 2014-11-14 (×2): 50 [IU] via SUBCUTANEOUS
  Filled 2014-11-12 (×4): qty 0.5

## 2014-11-12 MED ORDER — INSULIN GLARGINE 100 UNIT/ML SOLOSTAR PEN: 100.0000 [IU] | PEN_INJECTOR | Freq: Every day | SUBCUTANEOUS | Status: DC

## 2014-11-12 MED ORDER — METOPROLOL SUCCINATE ER 50 MG PO TB24
50.0000 mg | ORAL_TABLET | Freq: Every morning | ORAL | Status: DC
Start: 2014-11-12 — End: 2014-11-15
  Administered 2014-11-12 – 2014-11-15 (×4): 50 mg via ORAL
  Filled 2014-11-12 (×5): qty 1

## 2014-11-12 MED ORDER — FUROSEMIDE 20 MG PO TABS
20.0000 mg | ORAL_TABLET | Freq: Every day | ORAL | Status: DC
  Administered 2014-11-13 – 2014-11-15 (×3): 20 mg via ORAL
  Filled 2014-11-12 (×3): qty 1

## 2014-11-12 MED ORDER — ONDANSETRON HCL 4 MG PO TABS: 4.0000 mg | ORAL_TABLET | Freq: Four times a day (QID) | ORAL | Status: DC | PRN

## 2014-11-12 MED ORDER — DIAZEPAM 2 MG PO TABS: 2.0000 mg | ORAL_TABLET | Freq: Two times a day (BID) | ORAL | Status: DC | PRN

## 2014-11-12 MED ORDER — MORPHINE SULFATE 2 MG/ML IJ SOLN: 2.0000 mg | INTRAMUSCULAR | Status: DC | PRN

## 2014-11-12 MED ORDER — INSULIN ASPART 100 UNIT/ML ~~LOC~~ SOLN
0.0000 [IU] | Freq: Three times a day (TID) | SUBCUTANEOUS | Status: DC
  Administered 2014-11-13 – 2014-11-14 (×2): 2 [IU] via SUBCUTANEOUS
  Administered 2014-11-14: 3 [IU] via SUBCUTANEOUS

## 2014-11-12 MED ORDER — TAMSULOSIN HCL 0.4 MG PO CAPS
0.4000 mg | ORAL_CAPSULE | Freq: Every day | ORAL | Status: DC
  Administered 2014-11-12 – 2014-11-14 (×3): 0.4 mg via ORAL
  Filled 2014-11-12 (×4): qty 1

## 2014-11-12 MED ORDER — SODIUM CHLORIDE 0.9 % IJ SOLN: 3.0000 mL | INTRAMUSCULAR | Status: DC | PRN

## 2014-11-12 MED ORDER — VANCOMYCIN HCL 10 G IV SOLR
1500.0000 mg | INTRAVENOUS | Status: DC
  Filled 2014-11-12: qty 1500

## 2014-11-12 MED ORDER — ALUM & MAG HYDROXIDE-SIMETH 200-200-20 MG/5ML PO SUSP: 30.0000 mL | Freq: Four times a day (QID) | ORAL | Status: DC | PRN

## 2014-11-12 MED ORDER — SODIUM CHLORIDE 0.9 % IJ SOLN
3.0000 mL | Freq: Two times a day (BID) | INTRAMUSCULAR | Status: DC
  Administered 2014-11-13 – 2014-11-15 (×2): 3 mL via INTRAVENOUS

## 2014-11-12 MED ORDER — HEPARIN SODIUM (PORCINE) 5000 UNIT/ML IJ SOLN
5000.0000 [IU] | Freq: Three times a day (TID) | INTRAMUSCULAR | Status: DC
  Filled 2014-11-12 (×4): qty 1

## 2014-11-12 MED ORDER — CARBIDOPA-LEVODOPA 25-100 MG PO TABS
1.0000 | ORAL_TABLET | Freq: Two times a day (BID) | ORAL | Status: DC
  Administered 2014-11-12 – 2014-11-15 (×6): 1 via ORAL
  Filled 2014-11-12 (×8): qty 1

## 2014-11-12 MED ORDER — SODIUM CHLORIDE 0.9 % IV SOLN
INTRAVENOUS | Status: DC
  Administered 2014-11-12: 13:00:00 via INTRAVENOUS

## 2014-11-12 MED ORDER — OXYCODONE HCL 5 MG PO TABS: 5.0000 mg | ORAL_TABLET | ORAL | Status: DC | PRN

## 2014-11-12 MED ORDER — SERTRALINE HCL 25 MG PO TABS
25.0000 mg | ORAL_TABLET | Freq: Every morning | ORAL | Status: DC
  Administered 2014-11-12 – 2014-11-15 (×4): 25 mg via ORAL
  Filled 2014-11-12 (×4): qty 1

## 2014-11-12 MED ORDER — PRAVASTATIN SODIUM 20 MG PO TABS
20.0000 mg | ORAL_TABLET | Freq: Every day | ORAL | Status: DC
Start: 2014-11-12 — End: 2014-11-15
  Administered 2014-11-12 – 2014-11-14 (×3): 20 mg via ORAL
  Filled 2014-11-12 (×4): qty 1

## 2014-11-12 MED ORDER — INSULIN ASPART 100 UNIT/ML ~~LOC~~ SOLN: 0.0000 [IU] | Freq: Every day | SUBCUTANEOUS | Status: DC

## 2014-11-12 MED ORDER — ACETAMINOPHEN 650 MG RE SUPP: 650.0000 mg | Freq: Four times a day (QID) | RECTAL | Status: DC | PRN

## 2014-11-12 MED ORDER — FUROSEMIDE 10 MG/ML IJ SOLN
40.0000 mg | Freq: Once | INTRAMUSCULAR | Status: AC
  Administered 2014-11-12: 40 mg via INTRAVENOUS
  Filled 2014-11-12: qty 4

## 2014-11-12 MED ORDER — SODIUM CHLORIDE 0.9 % IV SOLN: 250.0000 mL | INTRAVENOUS | Status: DC | PRN

## 2014-11-12 MED ORDER — SODIUM CHLORIDE 0.9 % IV SOLN
2000.0000 mg | Freq: Once | INTRAVENOUS | Status: AC
  Administered 2014-11-12: 2000 mg via INTRAVENOUS
  Filled 2014-11-12: qty 2000

## 2014-11-12 MED ORDER — LEVOTHYROXINE SODIUM 100 MCG PO TABS
100.0000 ug | ORAL_TABLET | Freq: Every day | ORAL | Status: DC
  Administered 2014-11-12 – 2014-11-14 (×3): 100 ug via ORAL
  Filled 2014-11-12 (×5): qty 1

## 2014-11-12 MED ORDER — PHYTONADIONE 5 MG PO TABS
5.0000 mg | ORAL_TABLET | Freq: Once | ORAL | Status: AC
  Administered 2014-11-12: 5 mg via ORAL
  Filled 2014-11-12: qty 1

## 2014-11-12 MED ORDER — SODIUM CHLORIDE 0.9 % IJ SOLN
3.0000 mL | Freq: Two times a day (BID) | INTRAMUSCULAR | Status: DC
  Administered 2014-11-12 – 2014-11-14 (×5): 3 mL via INTRAVENOUS

## 2014-11-12 MED ORDER — ONDANSETRON HCL 4 MG/2ML IJ SOLN: 4.0000 mg | Freq: Four times a day (QID) | INTRAMUSCULAR | Status: DC | PRN

## 2014-11-12 MED ORDER — ACETAMINOPHEN 325 MG PO TABS
650.0000 mg | ORAL_TABLET | Freq: Four times a day (QID) | ORAL | Status: DC | PRN
  Administered 2014-11-13: 650 mg via ORAL
  Filled 2014-11-12: qty 2

## 2014-11-12 NOTE — ED Notes (Signed)
Lunch tray ordered, carb monified

## 2014-11-12 NOTE — ED Provider Notes (Signed)
CSN: [id number]     Arrival date & time 11/12/14  4174 History   First MD Initiated Contact with Patient 11/12/14 0930     Chief Complaint  Patient presents with  . Shortness of Breath     (Consider location/radiation/quality/duration/timing/severity/associated sxs/prior Treatment) Patient is a 80 y.o. male presenting with shortness of breath. The history is provided by the patient, the spouse and the EMS personnel.  Shortness of Breath Associated symptoms: no abdominal pain, no headaches, no rash and no vomiting    patient with 3 day history of shortness of breath and fatigue. Patient spacing in the recliner for the past 3 days. Patient sats on room air were 94% upon EMS arrival. Patient was placed on a nonrebreather and sats came up to 100%. Patient with mildly labored breathing as per EMS. Patient denies any history of similar problems. Patient denies any chest pain.  Past Medical History  Diagnosis Date  . Family history of malignant neoplasm of gastrointestinal tract   . Unspecified constipation   . Abnormal involuntary movements(781.0)   . Vitamin B12 deficiency   . Type II or unspecified type diabetes mellitus with neurological manifestations, not stated as uncontrolled   . Claudication   . Hypertrophy of prostate with urinary obstruction and other lower urinary tract symptoms (LUTS)   . Unspecified sleep apnea     NPSG 05/14/2007- AHI 80.7/ hr  . Obesity   . Mixed hyperlipidemia   . Atrial fibrillation     --myoview 07/2006: not gated. No ischemia or infarct  Diastolic HF-- Echo 0/8144 ER60% mild AI/MR. RV mild to moderately dilated/ dysfunction. ? restrictive CM . RVSP 36  . Diabetes mellitus type 1, uncontrolled   . Bladder polyps   . Diabetes mellitus   . Renal disorder    Past Surgical History  Procedure Laterality Date  . Appendectomy    . Kidney stone surgery      Kidney stone retrieval with uretal stent x 2   . Bladder polyps    . Transurethral resection of  bladder    . Orif elbow fracture     Family History  Problem Relation Age of Onset  . Lung cancer Mother 35    nonsmoker  . Colon cancer Sister   . Prostate cancer Paternal Uncle   . Stroke Father   . Hip fracture Father   . Healthy Son   . Healthy Daughter    History  Substance Use Topics  . Smoking status: Former Smoker -- 1.00 packs/day for 35 years    Types: Cigarettes    Quit date: 12/05/1985  . Smokeless tobacco: Never Used  . Alcohol Use: No    Review of Systems  Constitutional: Positive for fatigue.  HENT: Negative for congestion.   Eyes: Negative for visual disturbance.  Respiratory: Positive for shortness of breath.   Gastrointestinal: Negative for nausea, vomiting and abdominal pain.  Genitourinary: Negative for dysuria.  Musculoskeletal: Negative for myalgias and back pain.  Skin: Negative for rash.  Neurological: Negative for headaches.  Hematological: Does not bruise/bleed easily.  Psychiatric/Behavioral: Negative for confusion.      Allergies  Penicillins  Home Medications   Prior to Admission medications   Medication Sig Start Date End Date Taking? Authorizing Provider  acetaminophen (TYLENOL) 325 MG tablet Take 975 mg by mouth 2 (two) times daily.   Yes Historical Provider, MD  carbidopa-levodopa (SINEMET IR) 25-100 MG per tablet Take 1 tablet by mouth 2 (two) times daily. 07/07/14  Yes  Donika K Patel, DO  diazepam (VALIUM) 2 MG tablet Take 1 tablet (2 mg total) by mouth 2 (two) times daily as needed for anxiety. Patient taking differently: Take 2 mg by mouth every evening.  10/08/14  Yes Marletta Lor, MD  furosemide (LASIX) 20 MG tablet 1 tablet Monday, Wednesday, Friday only 06/24/14  Yes Marletta Lor, MD  gabapentin (NEURONTIN) 100 MG capsule Take 200 mg by mouth every evening. 1 at bedtime. 03/24/14  Yes Rowe Clack, MD  Insulin Glargine (LANTUS SOLOSTAR) 100 UNIT/ML Solostar Pen INJECT 70 UNITS AT BEDTIME AS DIRECTED Patient  taking differently: Inject 100 Units into the skin at bedtime.  10/16/14  Yes Marletta Lor, MD  levothyroxine (SYNTHROID, LEVOTHROID) 100 MCG tablet TAKE 1 TABLET BY MOUTH DAILY Patient taking differently: TAKE 1 TABLET BY MOUTH every evening 11/12/13  Yes Neena Rhymes, MD  lovastatin (MEVACOR) 20 MG tablet TAKE 1 TABLET BY MOUTH DAILY Patient taking differently: Take 20 mg by mouth every morning. TAKE 1 TABLET BY MOUTH DAILY 07/24/14  Yes Marletta Lor, MD  metoprolol (TOPROL-XL) 100 MG 24 hr tablet 1 half tablet daily Patient taking differently: Take 50 mg by mouth every morning. 1 half tablet daily 06/24/14  Yes Marletta Lor, MD  polyethylene glycol [facility]) powder Take 17 g by mouth at bedtime. For regularity    Yes Historical Provider, MD  sertraline (ZOLOFT) 25 MG tablet Take 1 tablet (25 mg total) by mouth daily. Patient taking differently: Take 25 mg by mouth every morning.  01/07/14  Yes Neena Rhymes, MD  Tamsulosin HCl (FLOMAX) 0.4 MG CAPS Take 0.4 mg by mouth daily after supper.    Yes Historical Provider, MD  Thiamine HCl (VITAMIN B-1 PO) Take 1 capsule by mouth 2 (two) times daily.   Yes Historical Provider, MD  trospium (SANCTURA) 20 MG tablet Take 20 mg by mouth 2 (two) times daily.   Yes Historical Provider, MD  warfarin (COUMADIN) 5 MG tablet Take as directed by anticoagulation clinic Patient taking differently: Take 2.5-5 mg by mouth every evening. Take half of a tablet (2.5mg ) on Sundays, Tuesdays, and Fridays.  On all other days, take a whole tablet (5mg ). 09/29/14  Yes Timoteo Gaul, FNP  B-D UF III MINI PEN NEEDLES 31G X 5 MM MISC USE AS DIRECTED    Neena Rhymes, MD  glucose blood test strip 1 each by Other route daily as needed for other. 07/10/14   Marletta Lor, MD  Insulin Pen Needle (PEN NEEDLES 31GX5/16") 31G X 8 MM MISC 1 each by Does not apply route as directed. 07/09/12   Neena Rhymes, MD  Lancets [facility] ULTRASOFT) lancets 1  each by Other route daily as needed for other. 07/10/14   Marletta Lor, MD   BP 121/73 mmHg  Pulse 90  Resp 22  SpO2 92% Physical Exam  Constitutional: He is oriented to person, place, and time. He appears well-developed and well-nourished. No distress.  HENT:  Head: Normocephalic and atraumatic.  Next membranes dry. Lips slightly purplish.  Eyes: Conjunctivae and EOM are normal. Pupils are equal, round, and reactive to light.  Neck: Normal range of motion. Neck supple.  Cardiovascular: Normal rate and normal heart sounds.   Irregular heart rate.  Pulmonary/Chest: Effort normal. He has rales.  Decreased breath sounds on right.  Abdominal: Soft. Bowel sounds are normal. There is no tenderness.  Musculoskeletal: Normal range of motion. He exhibits no edema.  Neurological: He is alert and oriented to person, place, and time. No cranial nerve deficit. He exhibits normal muscle tone. Coordination normal.  Skin: Skin is warm.  Nursing note and vitals reviewed.   ED Course  Procedures (including critical care time) Labs Review Labs Reviewed  COMPREHENSIVE METABOLIC PANEL - Abnormal; Notable for the following:    Sodium 148 (*)    BUN 86 (*)    Creatinine, Ser 2.33 (*)    AST 39 (*)    Alkaline Phosphatase 278 (*)    GFR calc non Af Amer 25 (*)    GFR calc Af Amer 29 (*)    All other components within normal limits  CBC WITH DIFFERENTIAL - Abnormal; Notable for the following:    RBC 3.83 (*)    Hemoglobin 11.3 (*)    HCT 35.8 (*)    RDW 15.7 (*)    All other components within normal limits  PROTIME-INR - Abnormal; Notable for the following:    Prothrombin Time 27.2 (*)    INR 2.49 (*)    All other components within normal limits  PRO B NATRIURETIC PEPTIDE  I-STAT TROPOININ, ED    Imaging Review Ct Chest Wo Contrast  11/12/2014   CLINICAL DATA:  Initial encounter for three day history of worsening shortness of breath.  EXAM: CT CHEST WITHOUT CONTRAST  TECHNIQUE:  Multidetector CT imaging of the chest was performed following the standard protocol without IV contrast.  COMPARISON:  None.  FINDINGS: Soft tissue / Mediastinum: There is no axillary lymphadenopathy. Scattered small mediastinal lymph nodes are evident and borderline enlarged. No gross hilar lymphadenopathy. The heart is enlarged. Coronary artery calcification is noted. No pericardial effusion.  Lungs / Pleura: There is a moderate to large right pleural effusion associated with right lower lobe collapse/ consolidation. This is difficult to further characterize given the lack of intravenous contrast. There is some mild dependent atelectasis in the left lung.  Bones: Bone windows reveal no worrisome lytic or sclerotic osseous lesions.  Upper Abdomen: Liver contour appears nodular, raising the question of, but not diagnostic for cirrhosis. No evidence for adrenal nodule or mass.  IMPRESSION: Moderate to large right pleural effusion with right lower lobe collapse/ consolidation. Limited study given the lack of intravenous contrast material. Nevertheless, given the unilateral disease, malignancy must be considered.   Electronically Signed   By: Misty Stanley M.D.   On: 11/12/2014 15:28   Dg Chest Port 1 View  11/12/2014   CLINICAL DATA:  Shortness of breath for 3 days.  EXAM: PORTABLE CHEST - 1 VIEW  COMPARISON:  05/16/2013.  FINDINGS: Mediastinum and hilar structures normal. Cardiomegaly with pulmonary venous congestion and bilateral pulmonary infiltrates. Particularly prominent on the right. Right pleural effusion. These findings are consistent with congestive heart failure. Superimposed pneumonia particularly in the right lung base cannot be excluded. No pneumothorax. No acute bony abnormality.  IMPRESSION: Congestive heart failure with pulmonary edema and right-sided pleural effusion. Superimposed pneumonia particularly in the right lung base cannot be excluded.   Electronically Signed   By: Marcello Moores  Register   On:  11/12/2014 12:04     EKG Interpretation   Date/Time:  Wednesday November 12 2014 09:36:54 EST Ventricular Rate:  97 PR Interval:    QRS Duration: 162 QT Interval:  401 QTC Calculation: 509 R Axis:   95 Text Interpretation:  Atrial fibrillation RBBB and LPFB Baseline wander in  lead(s) V2 Confirmed by Kecia Swoboda  MD, Jossette Zirbel ([id number]) on 11/12/2014 9:44:13  AM Also confirmed by Rogene Houston  MD, Kevina Piloto [phone])  on 11/12/2014 3:40:51 PM      MDM   Final diagnoses:  SOB (shortness of breath)  Pleural effusion on right    Patient with shortness of breath gradual onset getting worse. Patient's chest x-ray raises concern for pulmonary edema. However there was evidence of a pleural effusion. CT T chest raises concern is just for a right-sided pleural effusion and possible mass. She will require admission tapping of the fluid analysis. Patient started on 2 L nasal cannula oxygen and his oxygen saturations without the any exertion were right around 90-92%. Patient improved on the 2 L of oxygen.  Patient used to be a smoker in the past but quit a long time ago. [facility] hospitalist will admit. Patient's troponin was negative. Do not feel this was cardiac. Patient does have a history of atrophic fibrillation and is therapeutic on his Coumadin.   Fredia Sorrow, MD 11/12/14 [phone]

## 2014-11-12 NOTE — Progress Notes (Signed)
ANTIBIOTIC CONSULT NOTE - INITIAL  Pharmacy Consult for vancomycin and cefepime Indication: pneumonia  Allergies  Allergen Reactions  . Penicillins Rash    Happened 60 years ago    Patient Measurements: height 72 inches, weight 102 kg    Vital Signs: BP: 114/66 mmHg (12/09 1645) Pulse Rate: 89 (12/09 1645) Intake/Output from previous day:   Intake/Output from this shift: Total I/O In: -  Out: 520 [Urine:520]  Labs:  Recent Labs  11/12/14 0941  WBC 6.8  HGB 11.3*  PLT 204  CREATININE 2.33*   CrCl cannot be calculated (Unknown ideal weight.). No results for input(s): VANCOTROUGH, VANCOPEAK, VANCORANDOM, GENTTROUGH, GENTPEAK, GENTRANDOM, TOBRATROUGH, TOBRAPEAK, TOBRARND, AMIKACINPEAK, AMIKACINTROU, AMIKACIN in the last 72 hours.   Microbiology: No results found for this or any previous visit (from the past 720 hour(s)).  Medical History: Past Medical History  Diagnosis Date  . Family history of malignant neoplasm of gastrointestinal tract   . Unspecified constipation   . Abnormal involuntary movements(781.0)   . Vitamin B12 deficiency   . Type II or unspecified type diabetes mellitus with neurological manifestations, not stated as uncontrolled   . Claudication   . Hypertrophy of prostate with urinary obstruction and other lower urinary tract symptoms (LUTS)   . Unspecified sleep apnea     NPSG 05/14/2007- AHI 80.7/ hr  . Obesity   . Mixed hyperlipidemia   . Atrial fibrillation     --myoview 07/2006: not gated. No ischemia or infarct  Diastolic HF-- Echo 0/5697 ER60% mild AI/MR. RV mild to moderately dilated/ dysfunction. ? restrictive CM . RVSP 36  . Diabetes mellitus type 1, uncontrolled   . Bladder polyps   . Diabetes mellitus   . Renal disorder     Medications:   see med rec  Assessment: Patient is an 80 y.o M on coumadin PTA for Afib .  He presented to the ED with c/o SOB.  CXR with pulmonary edema and suspected PNA and chest CT showed moderate to  large right sided pleural effusion. Scr 2.33 (crcl~25).  Dr. Coralyn Pear is aware of PCN and ok to proceed with cefepime.  Goal of Therapy:  Vancomycin trough level 15-20 mcg/ml  Plan:  1) vancomycin 2000 gm IV x1 load, then 1500 mg IV q48h 2) cefepime 1gm IV q24h  Sonika Levins P 11/12/2014,5:12 PM

## 2014-11-12 NOTE — ED Notes (Signed)
[facility]- Pt coming from home with increased shortness of breath X3 days. Pt Has been sleeping in recliner X3 days. Pt was 94% on room air at EMS arrival. Pt placed on a NRB mask with O2 sats at 100%. Pt with mildly labored breathing on arrival.

## 2014-11-12 NOTE — H&P (Signed)
[facility] History and Physical  TARELL SCHOLLMEYER CXK:[id number] DOB: [birth year] DOA: 11/12/2014  Referring physician:  PCP: Nyoka Cowden, MD   Chief Complaint: Shortness of breath/cough  HPI: SELSO [name] is a 80 y.o. male with multiple medical conditions including atrial fibrillation currently on anticoagulation with warfarin, insulin-dependent diabetes mellitus, Parkinson's disease, dyslipidemia presenting to the emergency department with complaints of shortness of breath and cough. He states having shortness of breath intermittently in the past however symptoms worsening over the weekend and continuing until today. Shortness of breath is worse at nighttime, particularly when laying flat. He complains of associated cough without sputum production. He denies accompanying chest pain, fevers, chills, nausea, vomiting, wheezing, hemoptysis, abdominal pain or palpitations. He denies having a history of pleural effusions or requiring thoracentesis in his past. Workup in the emergency department included a CT scan of lungs that showed moderate to large right-sided pleural effusion with right lower lobe collapse/consolidation. He has a 30-pack-year history of smoking.                                                                                                                                                                                                                                     Review of Systems:  Constitutional:  No weight loss, night sweats, Fevers, chills, fatigue.  HEENT:  No headaches, Difficulty swallowing,Tooth/dental problems,Sore throat,  No sneezing, itching, ear ache, nasal congestion, post nasal drip,  Cardio-vascular:  No chest pain, Orthopnea, PND, swelling in lower extremities, anasarca, dizziness, palpitations  GI:  No heartburn, indigestion, abdominal pain, nausea, vomiting, diarrhea, change in bowel habits, loss of appetite  Resp:  Positive for shortness  of breath with exertion or at rest. No excess mucus, no productive cough, No non-productive cough, No coughing up of blood.No change in color of mucus.No wheezing.No chest wall deformity  Skin:  no rash or lesions.  GU:  no dysuria, change in color of urine, no urgency or frequency. No flank pain.  Musculoskeletal:  No joint pain or swelling. No decreased range of motion. No back pain.  Psych:  No change in mood or affect. No depression or anxiety. No memory loss.   Past Medical History  Diagnosis Date  . Family history of malignant neoplasm of gastrointestinal tract   . Unspecified constipation   . Abnormal involuntary movements(781.0)   . Vitamin B12 deficiency   . Type II or unspecified type  diabetes mellitus with neurological manifestations, not stated as uncontrolled   . Claudication   . Hypertrophy of prostate with urinary obstruction and other lower urinary tract symptoms (LUTS)   . Unspecified sleep apnea     NPSG 05/14/2007- AHI 80.7/ hr  . Obesity   . Mixed hyperlipidemia   . Atrial fibrillation     --myoview 07/2006: not gated. No ischemia or infarct  Diastolic HF-- Echo 02/5360 ER60% mild AI/MR. RV mild to moderately dilated/ dysfunction. ? restrictive CM . RVSP 36  . Diabetes mellitus type 1, uncontrolled   . Bladder polyps   . Diabetes mellitus   . Renal disorder    Past Surgical History  Procedure Laterality Date  . Appendectomy    . Kidney stone surgery      Kidney stone retrieval with uretal stent x 2   . Bladder polyps    . Transurethral resection of bladder    . Orif elbow fracture     Social History:  reports that he quit smoking about 28 years ago. His smoking use included Cigarettes. He has a 35 pack-year smoking history. He has never used smokeless tobacco. He reports that he does not drink alcohol or use illicit drugs.  Allergies  Allergen Reactions  . Penicillins Rash    Happened 60 years ago    Family History  Problem Relation Age of Onset  .  Lung cancer Mother 95    nonsmoker  . Colon cancer Sister   . Prostate cancer Paternal Uncle   . Stroke Father   . Hip fracture Father   . Healthy Son   . Healthy Daughter      Prior to Admission medications   Medication Sig Start Date End Date Taking? Authorizing Provider  acetaminophen (TYLENOL) 325 MG tablet Take 975 mg by mouth 2 (two) times daily.   Yes Historical Provider, MD  carbidopa-levodopa (SINEMET IR) 25-100 MG per tablet Take 1 tablet by mouth 2 (two) times daily. 07/07/14  Yes Donika K Patel, DO  diazepam (VALIUM) 2 MG tablet Take 1 tablet (2 mg total) by mouth 2 (two) times daily as needed for anxiety. Patient taking differently: Take 2 mg by mouth every evening.  10/08/14  Yes Marletta Lor, MD  furosemide (LASIX) 20 MG tablet 1 tablet Monday, Wednesday, Friday only 06/24/14  Yes Marletta Lor, MD  gabapentin (NEURONTIN) 100 MG capsule Take 200 mg by mouth every evening. 1 at bedtime. 03/24/14  Yes Rowe Clack, MD  Insulin Glargine (LANTUS SOLOSTAR) 100 UNIT/ML Solostar Pen INJECT 70 UNITS AT BEDTIME AS DIRECTED Patient taking differently: Inject 100 Units into the skin at bedtime.  10/16/14  Yes Marletta Lor, MD  levothyroxine (SYNTHROID, LEVOTHROID) 100 MCG tablet TAKE 1 TABLET BY MOUTH DAILY Patient taking differently: TAKE 1 TABLET BY MOUTH every evening 11/12/13  Yes Neena Rhymes, MD  lovastatin (MEVACOR) 20 MG tablet TAKE 1 TABLET BY MOUTH DAILY Patient taking differently: Take 20 mg by mouth every morning. TAKE 1 TABLET BY MOUTH DAILY 07/24/14  Yes Marletta Lor, MD  metoprolol (TOPROL-XL) 100 MG 24 hr tablet 1 half tablet daily Patient taking differently: Take 50 mg by mouth every morning. 1 half tablet daily 06/24/14  Yes Marletta Lor, MD  polyethylene glycol [facility]) powder Take 17 g by mouth at bedtime. For regularity    Yes Historical Provider, MD  sertraline (ZOLOFT) 25 MG tablet Take 1 tablet (25 mg total) by mouth  daily. Patient taking  differently: Take 25 mg by mouth every morning.  01/07/14  Yes Neena Rhymes, MD  Tamsulosin HCl (FLOMAX) 0.4 MG CAPS Take 0.4 mg by mouth daily after supper.    Yes Historical Provider, MD  Thiamine HCl (VITAMIN B-1 PO) Take 1 capsule by mouth 2 (two) times daily.   Yes Historical Provider, MD  trospium (SANCTURA) 20 MG tablet Take 20 mg by mouth 2 (two) times daily.   Yes Historical Provider, MD  warfarin (COUMADIN) 5 MG tablet Take as directed by anticoagulation clinic Patient taking differently: Take 2.5-5 mg by mouth every evening. Take half of a tablet (2.5mg ) on Sundays, Tuesdays, and Fridays.  On all other days, take a whole tablet (5mg ). 09/29/14  Yes Timoteo Gaul, FNP  B-D UF III MINI PEN NEEDLES 31G X 5 MM MISC USE AS DIRECTED    Neena Rhymes, MD  glucose blood test strip 1 each by Other route daily as needed for other. 07/10/14   Marletta Lor, MD  Insulin Pen Needle (PEN NEEDLES 31GX5/16") 31G X 8 MM MISC 1 each by Does not apply route as directed. 07/09/12   Neena Rhymes, MD  Lancets [facility] ULTRASOFT) lancets 1 each by Other route daily as needed for other. 07/10/14   Marletta Lor, MD   Physical Exam: Filed Vitals:   11/12/14 1245 11/12/14 1330 11/12/14 1430 11/12/14 1445  BP: 128/73 127/79 134/66 121/73  Pulse: 83 79 103 90  Resp: 23 23 16 22   SpO2: 96% 98% 94% 92%    Wt Readings from Last 3 Encounters:  10/07/14 101.861 kg (224 lb 9 oz)  09/24/14 102.059 kg (225 lb)  09/11/14 101.152 kg (223 lb)    General:  Appears calm and comfortable, nontoxic appearing, no acute distress Eyes: PERRL, normal lids, irises & conjunctiva ENT: grossly normal hearing, lips & tongue Neck: no LAD, masses or thyromegaly Cardiovascular: RRR, no m/r/g. No LE edema. Telemetry: SR, no arrhythmias  Respiratory: Has dullness to percussion over right lung with diminished breath sounds, left lung is clear to auscultation, no wheezing rhonchi or rales.   Abdomen: soft, ntnd Skin: no rash or induration seen on limited exam, hyperpigmentation of lower extremities Musculoskeletal: grossly normal tone BUE/BLE Psychiatric: grossly normal mood and affect, speech fluent and appropriate Neurologic: grossly non-focal.          Labs on Admission:  Basic Metabolic Panel:  Recent Labs Lab 11/12/14 0941  NA 148*  K 5.0  CL 106  CO2 28  GLUCOSE 94  BUN 86*  CREATININE 2.33*  CALCIUM 10.5   Liver Function Tests:  Recent Labs Lab 11/12/14 0941  AST 39*  ALT 19  ALKPHOS 278*  BILITOT 0.4  PROT 7.9  ALBUMIN 3.6   No results for input(s): LIPASE, AMYLASE in the last 168 hours. No results for input(s): AMMONIA in the last 168 hours. CBC:  Recent Labs Lab 11/12/14 0941  WBC 6.8  NEUTROABS 4.3  HGB 11.3*  HCT 35.8*  MCV 93.5  PLT 204   Cardiac Enzymes: No results for input(s): CKTOTAL, CKMB, CKMBINDEX, TROPONINI in the last 168 hours.  BNP (last 3 results)  Recent Labs  11/12/14 0941  PROBNP 440.4   CBG: No results for input(s): GLUCAP in the last 168 hours.  Radiological Exams on Admission: Ct Chest Wo Contrast  11/12/2014   CLINICAL DATA:  Initial encounter for three day history of worsening shortness of breath.  EXAM: CT CHEST WITHOUT CONTRAST  TECHNIQUE:  Multidetector CT imaging of the chest was performed following the standard protocol without IV contrast.  COMPARISON:  None.  FINDINGS: Soft tissue / Mediastinum: There is no axillary lymphadenopathy. Scattered small mediastinal lymph nodes are evident and borderline enlarged. No gross hilar lymphadenopathy. The heart is enlarged. Coronary artery calcification is noted. No pericardial effusion.  Lungs / Pleura: There is a moderate to large right pleural effusion associated with right lower lobe collapse/ consolidation. This is difficult to further characterize given the lack of intravenous contrast. There is some mild dependent atelectasis in the left lung.  Bones:  Bone windows reveal no worrisome lytic or sclerotic osseous lesions.  Upper Abdomen: Liver contour appears nodular, raising the question of, but not diagnostic for cirrhosis. No evidence for adrenal nodule or mass.  IMPRESSION: Moderate to large right pleural effusion with right lower lobe collapse/ consolidation. Limited study given the lack of intravenous contrast material. Nevertheless, given the unilateral disease, malignancy must be considered.   Electronically Signed   By: Misty Stanley M.D.   On: 11/12/2014 15:28   Dg Chest Port 1 View  11/12/2014   CLINICAL DATA:  Shortness of breath for 3 days.  EXAM: PORTABLE CHEST - 1 VIEW  COMPARISON:  05/16/2013.  FINDINGS: Mediastinum and hilar structures normal. Cardiomegaly with pulmonary venous congestion and bilateral pulmonary infiltrates. Particularly prominent on the right. Right pleural effusion. These findings are consistent with congestive heart failure. Superimposed pneumonia particularly in the right lung base cannot be excluded. No pneumothorax. No acute bony abnormality.  IMPRESSION: Congestive heart failure with pulmonary edema and right-sided pleural effusion. Superimposed pneumonia particularly in the right lung base cannot be excluded.   Electronically Signed   By: Marcello Moores  Register   On: 11/12/2014 12:04    EKG: Independently reviewed.   Assessment/Plan Principal Problem:   Pleural effusion Active Problems:   Shortness of breath   Hypothyroidism   Atrial fibrillation   Chronic right-sided HF (heart failure)   COPD with emphysema, mild   CKD (chronic kidney disease) stage 3, GFR 30-59 ml/min   Pleural effusion on right   1. Pleural effusion. Patient presenting with complaints of shortness of breath and associated cough, imaging studies in the emergency department showing moderate to large right-sided pleural effusion. He does not report a history of pleural effusions. Radiology reporting that lack of IV contrast limiting study. (IV  contrast not used given renal failure) He has a 30-pack-year history of tobacco abuse for which malignancy should be considered. Other possibilities include parapneumonic process although he does not appear toxic on exam, having normal white count, presenting afebrile. However, he is immunocompromised from history of diabetes mellitus and  atypical presentation should also be considered. Congestive heart failure is also a possible cause. Will obtain ultrasound-guided thoracentesis, send fluid for Gram stain and cell count, culture, LDH, protein, glucose, cytology. Will cover him empirically overnight with vancomycin and cefepime, however the need for continuing antimicrobial therapy should be readdressed in a.m. Will also check a transthoracic echocardiogram, with his last echo done on 09/07/2010 that showed EF of 60-65%. Admit patient to telemetry.   2. Atrial fibrillation. Patient on chronic anticoagulation with warfarin having INR of 2.49 on initial lab work. Given the need for thoracentesis will administer 10 mg of vitamin K this evening to reverse Coumadin. Meanwhile will continue metoprolol 100 mg by mouth daily.  3.  Insulin-dependent diabetes mellitus. Will continue his home regimen with Lantus 100 units subcutaneous daily. Obtained Accu-Cheks every before meals and  daily at bedtime with sliding scale coverage 4. Stage III chronic kidney disease. Patient having baseline creatinine near 2.3.  5. Elevated alkaline phosphatase. Labs revealing an alkaline phosphatase of 278, will check a GGT level in order to distinguish if this elevation is related to liver or other causes.  6. Chronic obstructive pulmonary disease. Stable  7.  Hypothyroidism. Will check a TSH, continue Synthroid 100 g by mouth daily   Code Status: I discussed CODE STATUS with patient, he wishes to be a DO NOT RESUSCITATE Family Communication:  Disposition Plan: Anticipate patient will require greater than 2 nights hospitalization     Time spent:70 minutes   Kelvin Cellar [facility] Pager [phone]

## 2014-11-12 NOTE — ED Notes (Signed)
Reatha Harps, pt's wife, left phone number [phone]- mobile, [phone].

## 2014-11-12 NOTE — ED Notes (Addendum)
Pt transported for CT chest.

## 2014-11-13 ENCOUNTER — Inpatient Hospital Stay (HOSPITAL_COMMUNITY): Payer: Medicare Other

## 2014-11-13 ENCOUNTER — Ambulatory Visit: Payer: Medicare Other | Admitting: Physical Therapy

## 2014-11-13 DIAGNOSIS — I480 Paroxysmal atrial fibrillation: Secondary | ICD-10-CM

## 2014-11-13 DIAGNOSIS — J948 Other specified pleural conditions: Secondary | ICD-10-CM

## 2014-11-13 DIAGNOSIS — I059 Rheumatic mitral valve disease, unspecified: Secondary | ICD-10-CM

## 2014-11-13 LAB — CBC
HEMATOCRIT: 34.7 % — AB (ref 39.0–52.0)
Hemoglobin: 10.7 g/dL — ABNORMAL LOW (ref 13.0–17.0)
MCH: 28.6 pg (ref 26.0–34.0)
MCHC: 30.8 g/dL (ref 30.0–36.0)
MCV: 92.8 fL (ref 78.0–100.0)
Platelets: 193 10*3/uL (ref 150–400)
RBC: 3.74 MIL/uL — ABNORMAL LOW (ref 4.22–5.81)
RDW: 15.6 % — ABNORMAL HIGH (ref 11.5–15.5)
WBC: 7.1 10*3/uL (ref 4.0–10.5)

## 2014-11-13 LAB — BASIC METABOLIC PANEL
Anion gap: 13 (ref 5–15)
BUN: 76 mg/dL — AB (ref 6–23)
CALCIUM: 9.9 mg/dL (ref 8.4–10.5)
CO2: 27 meq/L (ref 19–32)
CREATININE: 2.23 mg/dL — AB (ref 0.50–1.35)
Chloride: 103 mEq/L (ref 96–112)
GFR calc Af Amer: 30 mL/min — ABNORMAL LOW (ref >90)
GFR calc non Af Amer: 26 mL/min — ABNORMAL LOW (ref >90)
GLUCOSE: 110 mg/dL — AB (ref 70–99)
Potassium: 4.3 mEq/L (ref 3.7–5.3)
Sodium: 143 mEq/L (ref 137–147)

## 2014-11-13 LAB — GLUCOSE, CAPILLARY
GLUCOSE-CAPILLARY: 80 mg/dL (ref 70–99)
Glucose-Capillary: 108 mg/dL — ABNORMAL HIGH (ref 70–99)
Glucose-Capillary: 139 mg/dL — ABNORMAL HIGH (ref 70–99)
Glucose-Capillary: 106 mg/dL — ABNORMAL HIGH (ref 70–99)

## 2014-11-13 LAB — PROTIME-INR
INR: 2.35 — ABNORMAL HIGH (ref 0.00–1.49)
Prothrombin Time: 26 seconds — ABNORMAL HIGH (ref 11.6–15.2)

## 2014-11-13 MED ORDER — PHYTONADIONE 5 MG PO TABS
5.0000 mg | ORAL_TABLET | Freq: Once | ORAL | Status: AC
Start: 2014-11-13 — End: 2014-11-13
  Administered 2014-11-13: 5 mg via ORAL
  Filled 2014-11-13: qty 1

## 2014-11-13 MED ORDER — POLYETHYLENE GLYCOL 3350 17 G PO PACK
17.0000 g | PACK | Freq: Every day | ORAL | Status: DC
  Administered 2014-11-13: 17 g via ORAL
  Filled 2014-11-13 (×3): qty 1

## 2014-11-13 MED ORDER — CETYLPYRIDINIUM CHLORIDE 0.05 % MT LIQD
7.0000 mL | Freq: Two times a day (BID) | OROMUCOSAL | Status: DC
  Administered 2014-11-13 – 2014-11-15 (×4): 7 mL via OROMUCOSAL

## 2014-11-13 NOTE — Plan of Care (Signed)
Problem: Phase II Progression Outcomes Goal: Discharge plan established Outcome: Progressing Goal: IV changed to normal saline lock Outcome: Completed/Met Date Met:  11/13/14 Goal: Obtain order to discontinue catheter if appropriate Outcome: Not Applicable Date Met:  38/32/91

## 2014-11-13 NOTE — Progress Notes (Signed)
  Echocardiogram 2D Echocardiogram has been performed.  [name] [name] M 11/13/2014, 3:29 PM

## 2014-11-13 NOTE — Progress Notes (Signed)
UR completed Tonjia Parillo K. Danni Leabo, RN, BSN, MSHL, CCM  11/13/2014 2:11 PM

## 2014-11-13 NOTE — Plan of Care (Signed)
Problem: Phase I Progression Outcomes Goal: Voiding-avoid urinary catheter unless indicated Outcome: Completed/Met Date Met:  11/13/14     

## 2014-11-13 NOTE — Progress Notes (Signed)
[facility] PROGRESS NOTE  [name] [name] NUU:[id number] DOB: [birth year] DOA: 11/12/2014 PCP: Nyoka Cowden, MD  Assessment/Plan: 1. Pleural effusion -CT scan of lungs showing moderate to large right-sided pleural effusion. He denies having history of pleural effusions and radiology reporting that due to lack of IV contrast study was limited. -Plan for ultrasound-guided thoracentesis, which will likely be performed tomorrow morning as his INR was still elevated at 2.35. -For now will continue empiric IV antimicrobial therapy with cefepime and vancomycin until procedure can be done. -Transthoracic echocardiogram is pending  2. Atrial fibrillation.  -Heart rates in the 70s to 80s, continue metoprolol 50 mg by mouth daily -As mentioned above anticoagulation has been held for ultrasound-guided thoracentesis  3.  Insulin-dependent diabetes mellitus -Blood sugars are stable, will continue Lantus 50 units subcutaneous daily along with Accu-Cheks every before meals and every at bedtime with sliding scale coverage  4.  Stage III chronic kidney disease -Patient's creatinine remains stable at 2.23  5. Elevated alkaline phosphatase -GGT coming back elevated, suggesting elevated Alk Phos related to liver disease/biliary disease -Will check a RUQ U/S  Code Status: DO NOT RESUSCITATE Family Communication:  Disposition Plan: Plan for ultrasound-guided thoracentesis in a.m.   Consultants:  Interventional radiology   Antibiotics:  Vancomycin (started on 11/12/2014)  Cefepime (started on 11/12/2014)  HPI/Subjective: Patient is a pleasant 80 year old with a past medical history of atrial fibrillation on chronic anticoagulation with warfarin, insulin dependent diabetes mellitus, Parkinson's disease who was admitted to the medicine service on 11/12/2014. He presented with complaints of shortness of breath with associated cough. Workup initially performed in the emergency department  revealed moderate to large right-sided pleural effusion with presence of right lower lobe collapse/consolidation. He was admitted to telemetry. Given the need for thoracentesis he was administered 5 mg of vitamin K per pharmacy's recommendations. A.m. lab work showing INR of 2.35 for which he was administered 5 mg of vitamin K today. He was unable to undergo thoracentesis given elevated INR. Patient meanwhile reports feeling about the same, as no new complaints.  Objective: Filed Vitals:   11/13/14 0958  BP: 119/77  Pulse: 78  Temp:   Resp: 18    Intake/Output Summary (Last 24 hours) at 11/13/14 1516 Last data filed at 11/13/14 1330  Gross per 24 hour  Intake   1250 ml  Output   1345 ml  Net    -95 ml   Filed Weights   11/12/14 1749 11/13/14 0536  Weight: 99.1 kg (218 lb 7.6 oz) 99.247 kg (218 lb 12.8 oz)    Exam:   General:  Patient is in no acute distress, awake alert, reports ongoing shortness of breath  Cardiovascular: Regular rate and rhythm normal S1-S2  Respiratory: Ongoing dullness to percussion over right lung with decreased breath sounds, left lung maintains clear  Abdomen: Soft nontender nondistended  Musculoskeletal: No edema  Data Reviewed: Basic Metabolic Panel:  Recent Labs Lab 11/12/14 0941 11/13/14 0650  NA 148* 143  K 5.0 4.3  CL 106 103  CO2 28 27  GLUCOSE 94 110*  BUN 86* 76*  CREATININE 2.33* 2.23*  CALCIUM 10.5 9.9   Liver Function Tests:  Recent Labs Lab 11/12/14 0941  AST 39*  ALT 19  ALKPHOS 278*  BILITOT 0.4  PROT 7.9  ALBUMIN 3.6   No results for input(s): LIPASE, AMYLASE in the last 168 hours. No results for input(s): AMMONIA in the last 168 hours. CBC:  Recent Labs Lab 11/12/14 0941 11/13/14 0650  WBC 6.8 7.1  NEUTROABS 4.3  --   HGB 11.3* 10.7*  HCT 35.8* 34.7*  MCV 93.5 92.8  PLT 204 193   Cardiac Enzymes: No results for input(s): CKTOTAL, CKMB, CKMBINDEX, TROPONINI in the last 168 hours. BNP (last 3  results)  Recent Labs  11/12/14 0941  PROBNP 440.4   CBG:  Recent Labs Lab 11/12/14 1832 11/12/14 2117 11/13/14 0631 11/13/14 1139  GLUCAP 115* 135* 108* 139*    No results found for this or any previous visit (from the past 240 hour(s)).   Studies: Ct Chest Wo Contrast  11/12/2014   CLINICAL DATA:  Initial encounter for three day history of worsening shortness of breath.  EXAM: CT CHEST WITHOUT CONTRAST  TECHNIQUE: Multidetector CT imaging of the chest was performed following the standard protocol without IV contrast.  COMPARISON:  None.  FINDINGS: Soft tissue / Mediastinum: There is no axillary lymphadenopathy. Scattered small mediastinal lymph nodes are evident and borderline enlarged. No gross hilar lymphadenopathy. The heart is enlarged. Coronary artery calcification is noted. No pericardial effusion.  Lungs / Pleura: There is a moderate to large right pleural effusion associated with right lower lobe collapse/ consolidation. This is difficult to further characterize given the lack of intravenous contrast. There is some mild dependent atelectasis in the left lung.  Bones: Bone windows reveal no worrisome lytic or sclerotic osseous lesions.  Upper Abdomen: Liver contour appears nodular, raising the question of, but not diagnostic for cirrhosis. No evidence for adrenal nodule or mass.  IMPRESSION: Moderate to large right pleural effusion with right lower lobe collapse/ consolidation. Limited study given the lack of intravenous contrast material. Nevertheless, given the unilateral disease, malignancy must be considered.   Electronically Signed   By: Misty Stanley M.D.   On: 11/12/2014 15:28   Dg Chest Port 1 View  11/12/2014   CLINICAL DATA:  Shortness of breath for 3 days.  EXAM: PORTABLE CHEST - 1 VIEW  COMPARISON:  05/16/2013.  FINDINGS: Mediastinum and hilar structures normal. Cardiomegaly with pulmonary venous congestion and bilateral pulmonary infiltrates. Particularly prominent on  the right. Right pleural effusion. These findings are consistent with congestive heart failure. Superimposed pneumonia particularly in the right lung base cannot be excluded. No pneumothorax. No acute bony abnormality.  IMPRESSION: Congestive heart failure with pulmonary edema and right-sided pleural effusion. Superimposed pneumonia particularly in the right lung base cannot be excluded.   Electronically Signed   By: Marcello Moores  Register   On: 11/12/2014 12:04    Scheduled Meds: . carbidopa-levodopa  1 tablet Oral BID  . ceFEPime (MAXIPIME) IV  1 g Intravenous Q24H  . furosemide  20 mg Oral Daily  . gabapentin  200 mg Oral QHS  . insulin aspart  0-15 Units Subcutaneous TID WC  . insulin aspart  0-5 Units Subcutaneous QHS  . insulin glargine  50 Units Subcutaneous QHS  . levothyroxine  100 mcg Oral QHS  . metoprolol  50 mg Oral q morning - 10a  . pravastatin  20 mg Oral q1800  . sertraline  25 mg Oral q morning - 10a  . sodium chloride  3 mL Intravenous Q12H  . sodium chloride  3 mL Intravenous Q12H  . tamsulosin  0.4 mg Oral QPC supper  . [START ON 11/14/2014] vancomycin  1,500 mg Intravenous Q48H   Continuous Infusions:   Principal Problem:   Pleural effusion Active Problems:   Shortness of breath   Hypothyroidism   Atrial fibrillation   Chronic right-sided HF (  heart failure)   COPD with emphysema, mild   CKD (chronic kidney disease) stage 3, GFR 30-59 ml/min   Pleural effusion on right    Time spent: 35 min    Kelvin Cellar  [facility] Pager [phone]. If 7PM-7AM, please contact night-coverage at [email or web address], password [facility] 11/13/2014, 3:16 PM  LOS: 1 day

## 2014-11-13 NOTE — Care Management Note (Addendum)
    Page 1 of 1   11/15/2014     12:33:43 PM CARE MANAGEMENT NOTE 11/15/2014  Patient:  CHAYSON, CHARTERS   Account Number:  [id number]  Date Initiated:  11/13/2014  Documentation initiated by:  Mariann Laster  Subjective/Objective Assessment:   PNA     Action/Plan:   CM to follow for disposition needs   Anticipated DC Date:  11/16/2014   Anticipated DC Plan:  [place]         Choice offered to / List presented to:          [facility] arranged  HH-1 RN  [place].   Status of service:  Completed, signed off Medicare Important Message given?  YES (If response is "NO", the following Medicare IM given date fields will be blank) Date Medicare IM given:  11/14/2014 Medicare IM given by:  HUTCHINSON,CRYSTAL Date Additional Medicare IM given:   Additional Medicare IM given by:    Discharge Disposition:  [place]  Per UR Regulation:  Reviewed for med. necessity/level of care/duration of stay  If discussed at [place] of Stay Meetings, dates discussed:    Comments:  11/15/14 CM met with pt and pt's wife in room to offer choice of home health agency.  Family chooses [facility] to render HHPT/RN.  Address and contact information verified with pt. Referral called to [facility] rep, Stephanie.  Pt has rolling walker at home and wife verbalized understanding of insurance not covering tub seat but plans to go to [facility] once she determines the size of her shower stall.  No other CM needs were communicated.  Mariane Masters, BSN, [place].

## 2014-11-13 NOTE — Plan of Care (Signed)
Problem: Phase I Progression Outcomes Goal: Pain controlled with appropriate interventions Outcome: Completed/Met Date Met:  01/08/14

## 2014-11-14 ENCOUNTER — Inpatient Hospital Stay (HOSPITAL_COMMUNITY): Payer: Medicare Other

## 2014-11-14 ENCOUNTER — Telehealth: Payer: Self-pay | Admitting: Neurology

## 2014-11-14 LAB — BODY FLUID CELL COUNT WITH DIFFERENTIAL
LYMPHS FL: 62 %
Monocyte-Macrophage-Serous Fluid: 20 % — ABNORMAL LOW (ref 50–90)
Neutrophil Count, Fluid: 18 % (ref 0–25)
Total Nucleated Cell Count, Fluid: 1202 cu mm — ABNORMAL HIGH (ref 0–1000)

## 2014-11-14 LAB — BASIC METABOLIC PANEL
ANION GAP: 11 (ref 5–15)
BUN: 76 mg/dL — ABNORMAL HIGH (ref 6–23)
CHLORIDE: 106 meq/L (ref 96–112)
CO2: 31 mEq/L (ref 19–32)
Calcium: 10.1 mg/dL (ref 8.4–10.5)
Creatinine, Ser: 2.18 mg/dL — ABNORMAL HIGH (ref 0.50–1.35)
GFR calc Af Amer: 31 mL/min — ABNORMAL LOW (ref >90)
GFR calc non Af Amer: 27 mL/min — ABNORMAL LOW (ref >90)
Glucose, Bld: 119 mg/dL — ABNORMAL HIGH (ref 70–99)
Potassium: 4.7 mEq/L (ref 3.7–5.3)
SODIUM: 148 meq/L — AB (ref 137–147)

## 2014-11-14 LAB — CBC
HCT: 36.2 % — ABNORMAL LOW (ref 39.0–52.0)
HEMOGLOBIN: 11.1 g/dL — AB (ref 13.0–17.0)
MCH: 28.5 pg (ref 26.0–34.0)
MCHC: 30.7 g/dL (ref 30.0–36.0)
MCV: 92.8 fL (ref 78.0–100.0)
PLATELETS: 202 10*3/uL (ref 150–400)
RBC: 3.9 MIL/uL — AB (ref 4.22–5.81)
RDW: 15.5 % (ref 11.5–15.5)
WBC: 7.1 10*3/uL (ref 4.0–10.5)

## 2014-11-14 LAB — GLUCOSE, CAPILLARY
GLUCOSE-CAPILLARY: 127 mg/dL — AB (ref 70–99)
Glucose-Capillary: 116 mg/dL — ABNORMAL HIGH (ref 70–99)
Glucose-Capillary: 159 mg/dL — ABNORMAL HIGH (ref 70–99)
Glucose-Capillary: 142 mg/dL — ABNORMAL HIGH (ref 70–99)

## 2014-11-14 LAB — PROTEIN, BODY FLUID: Total protein, fluid: 5.3 g/dL

## 2014-11-14 LAB — PROTIME-INR
INR: 1.61 — AB (ref 0.00–1.49)
PROTHROMBIN TIME: 19.3 s — AB (ref 11.6–15.2)

## 2014-11-14 LAB — GLUCOSE, SEROUS FLUID: Glucose, Fluid: 165 mg/dL

## 2014-11-14 LAB — LACTATE DEHYDROGENASE, PLEURAL OR PERITONEAL FLUID: LD, Fluid: 156 U/L — ABNORMAL HIGH (ref 3–23)

## 2014-11-14 MED ORDER — WARFARIN SODIUM 5 MG PO TABS: 5.0000 mg | ORAL_TABLET | Freq: Every day | ORAL | Status: DC

## 2014-11-14 MED ORDER — HEPARIN SODIUM (PORCINE) 5000 UNIT/ML IJ SOLN
5000.0000 [IU] | Freq: Three times a day (TID) | INTRAMUSCULAR | Status: DC
  Administered 2014-11-14 – 2014-11-15 (×2): 5000 [IU] via SUBCUTANEOUS
  Filled 2014-11-14 (×3): qty 1

## 2014-11-14 MED ORDER — DEXTROSE 5 % IV SOLN
2.0000 g | INTRAVENOUS | Status: DC
  Administered 2014-11-14: 2 g via INTRAVENOUS
  Filled 2014-11-14 (×2): qty 2

## 2014-11-14 MED ORDER — WARFARIN SODIUM 5 MG PO TABS
5.0000 mg | ORAL_TABLET | Freq: Once | ORAL | Status: AC
  Administered 2014-11-14: 5 mg via ORAL
  Filled 2014-11-14: qty 1

## 2014-11-14 MED ORDER — LIDOCAINE HCL (PF) 1 % IJ SOLN
INTRAMUSCULAR | Status: AC
  Filled 2014-11-14: qty 10

## 2014-11-14 MED ORDER — VANCOMYCIN HCL 10 G IV SOLR
1500.0000 mg | INTRAVENOUS | Status: DC
  Administered 2014-11-14 – 2014-11-15 (×2): 1500 mg via INTRAVENOUS
  Filled 2014-11-14 (×2): qty 1500

## 2014-11-14 MED ORDER — WARFARIN - PHARMACIST DOSING INPATIENT: Freq: Every day | Status: DC

## 2014-11-14 NOTE — Progress Notes (Signed)
ANTIBIOTIC CONSULT NOTE - FOLLOW UP  Pharmacy Consult:  Vancomycin / Cefepime Indication:  PNA  Allergies  Allergen Reactions  . Penicillins Rash    Happened 60 years ago    Patient Measurements: Height: 6' (182.9 cm) Weight: 216 lb 4.3 oz (98.1 kg) (SCAle A) IBW/kg (Calculated) : 77.6  Vital Signs: Temp: 98.5 F (36.9 C) (12/11 0557) Temp Source: Oral (12/11 0557) BP: 128/62 mmHg (12/11 0557) Pulse Rate: 90 (12/11 0557) Intake/Output from previous day: 12/10 0701 - 12/11 0700 In: 583 [P.O.:580; I.V.:3] Out: 576 [Urine:575; Stool:1]  Labs:  Recent Labs  11/12/14 0941 11/13/14 0650 11/14/14 0427  WBC 6.8 7.1 7.1  HGB 11.3* 10.7* 11.1*  PLT 204 193 202  CREATININE 2.33* 2.23* 2.18*   Estimated Creatinine Clearance: 32.3 mL/min (by C-G formula based on Cr of 2.18). No results for input(s): VANCOTROUGH, VANCOPEAK, VANCORANDOM, GENTTROUGH, GENTPEAK, GENTRANDOM, TOBRATROUGH, TOBRAPEAK, TOBRARND, AMIKACINPEAK, AMIKACINTROU, AMIKACIN in the last 72 hours.   Microbiology: No results found for this or any previous visit (from the past 720 hour(s)).    Assessment: 4 YOM presented to the ED with SOB and started on vancomycin and cefepime for PNA.  Patient's renal function is improving.  Noted plan for possible thoracentesis today for pleural effusion.  Vanc 12/9 >> Cefepime 12/9 >>   Goal of Therapy:  Vancomycin trough level 15-20 mcg/ml   Plan:  - Change Vanc to 1500mg  IV Q24H - Change Cefepime 2gm IV Q24H - Monitor renal fxn, clinical progress, vanc trough as indicated - PharmD d/c'ed heparin SQ when it was ordered on 11/13/14 d/t therapeutic INR.  INR now sub-therapeutic and will reorder to start tonight as MD plans to perform thoracentesis today - F/U resume Coumadin when appropriate    Veverly Larimer D. Mina Marble, PharmD, BCPS Pager:  [phone] 11/14/2014, 8:29 AM

## 2014-11-14 NOTE — Procedures (Signed)
US guided Rt thora  1 liter bloody fluid Pt tolerated well  cxr pending

## 2014-11-14 NOTE — Telephone Encounter (Signed)
This message is for you.

## 2014-11-14 NOTE — Telephone Encounter (Signed)
Left [name] [name] a message that Dr. Posey Pronto had not called her dad but that I spoke to him the other day advising him to go to the ER or call his cardiologist is he starts having breathing trouble again.

## 2014-11-14 NOTE — Progress Notes (Signed)
[facility] PROGRESS NOTE  [name] [name] WHQ:[id number] DOB: July 01, 1931 DOA: 11/12/2014 PCP: Nyoka Cowden, MD  Assessment/Plan: 1. Pleural effusion -CT scan of lungs showing moderate to large right-sided pleural effusion. He denies having history of pleural effusions and radiology reporting that due to lack of IV contrast study was limited. -On 11/14/2014 he underwent U/S guided thoracentesis with removal of 1 liter of fluid -Fluid analysis consistent with exudative effusion -Having history of tobacco abuse there is concern for underlying malignancy. Awaiting cytology. If this comes back negative may need biopsy and Pulmonary Medicine consult.  -Follow up on cultures.   2. Atrial fibrillation.  -Heart rates in the 70s to 80s, continue metoprolol 50 mg by mouth daily -As mentioned above anticoagulation has been held for ultrasound-guided thoracentesis -Restarting anticoagulation  3.  Insulin-dependent diabetes mellitus -Blood sugars are stable, will continue Lantus 50 units subcutaneous daily along with Accu-Cheks every before meals and every at bedtime with sliding scale coverage  4.  Stage III chronic kidney disease -Patient's creatinine remains stable at 2.18  5. Elevated alkaline phosphatase -GGT coming back elevated, suggesting elevated Alk Phos related to liver disease/biliary disease -RUQ U/S unremarkable   Code Status: DO NOT RESUSCITATE Family Communication:  Disposition Plan: Anticipate discharge home when medically stable.    Consultants:  Interventional radiology   Antibiotics:  Vancomycin (started on 11/12/2014)  Cefepime (started on 11/12/2014)  HPI/Subjective: Patient is a pleasant 80 year old with a past medical history of atrial fibrillation on chronic anticoagulation with warfarin, insulin dependent diabetes mellitus, Parkinson's disease who was admitted to the medicine service on 11/12/2014. He presented with complaints of shortness of  breath with associated cough. Workup initially performed in the emergency department revealed moderate to large right-sided pleural effusion with presence of right lower lobe collapse/consolidation. He was admitted to telemetry. Given the need for thoracentesis he was administered 5 mg of vitamin K per pharmacy's recommendations. A.m. lab work showing INR of 2.35 for which he was administered 5 mg of vitamin K today. He was unable to undergo thoracentesis given elevated INR. Patient meanwhile reports feeling about the same, as no new complaints.  Objective: Filed Vitals:   11/14/14 1616  BP: 113/64  Pulse: 91  Temp: 98.2 F (36.8 C)  Resp: 20    Intake/Output Summary (Last 24 hours) at 11/14/14 1618 Last data filed at 11/14/14 1327  Gross per 24 hour  Intake    723 ml  Output    151 ml  Net    572 ml   Filed Weights   11/12/14 1749 11/13/14 0536 11/14/14 0557  Weight: 99.1 kg (218 lb 7.6 oz) 99.247 kg (218 lb 12.8 oz) 98.1 kg (216 lb 4.3 oz)    Exam:   General:  Patient is in no acute distress, awake alert, reports ongoing shortness of breath  Cardiovascular: Regular rate and rhythm normal S1-S2  Respiratory: Ongoing dullness to percussion over right lung with decreased breath sounds, left lung maintains clear  Abdomen: Soft nontender nondistended  Musculoskeletal: No edema  Data Reviewed: Basic Metabolic Panel:  Recent Labs Lab 11/12/14 0941 11/13/14 0650 11/14/14 0427  NA 148* 143 148*  K 5.0 4.3 4.7  CL 106 103 106  CO2 28 27 31   GLUCOSE 94 110* 119*  BUN 86* 76* 76*  CREATININE 2.33* 2.23* 2.18*  CALCIUM 10.5 9.9 10.1   Liver Function Tests:  Recent Labs Lab 11/12/14 0941  AST 39*  ALT 19  ALKPHOS 278*  BILITOT 0.4  PROT  7.9  ALBUMIN 3.6   No results for input(s): LIPASE, AMYLASE in the last 168 hours. No results for input(s): AMMONIA in the last 168 hours. CBC:  Recent Labs Lab 11/12/14 0941 11/13/14 0650 11/14/14 0427  WBC 6.8 7.1 7.1   NEUTROABS 4.3  --   --   HGB 11.3* 10.7* 11.1*  HCT 35.8* 34.7* 36.2*  MCV 93.5 92.8 92.8  PLT 204 193 202   Cardiac Enzymes: No results for input(s): CKTOTAL, CKMB, CKMBINDEX, TROPONINI in the last 168 hours. BNP (last 3 results)  Recent Labs  11/12/14 0941  PROBNP 440.4   CBG:  Recent Labs Lab 11/13/14 1646 11/13/14 2113 11/14/14 0548 11/14/14 1123 11/14/14 1607  GLUCAP 106* 80 116* 159* 142*    No results found for this or any previous visit (from the past 240 hour(s)).   Studies: Dg Chest 1 View  11/14/2014   CLINICAL DATA:  80 year old male status post right-sided thoracentesis.  EXAM: CHEST - 1 VIEW  COMPARISON:  Chest CT 11/12/2014 and earlier.  FINDINGS: Portable AP upright view at 1423 hrs. No pneumothorax. Regressed veiling opacity at the right lung base and mildly improved right lung base ventilation. Residual pleural fluid tracking into the minor fissure. Stable cardiomegaly and mediastinal contours. Left lung is stable. Visualized tracheal air column is within normal limits.  IMPRESSION: No pneumothorax, regressed right pleural effusion, and mildly improved right lung base ventilation following thoracentesis.   Electronically Signed   By: Lars Pinks M.D.   On: 11/14/2014 14:35   US Abdomen Complete  11/13/2014   CLINICAL DATA:  Elevated alkaline phosphatase. Previous appendectomy and stones excision. BPH, renal stones, diabetes.  EXAM: ULTRASOUND ABDOMEN COMPLETE  COMPARISON:  CT of the abdomen and pelvis 10/12/2006  FINDINGS: Gallbladder: Gallbladder has a normal appearance. Gallbladder wall is 2.4 mm, within normal limits. No stones or pericholecystic fluid. No sonographic Murphy's sign.  Common bile duct: Diameter: 4.0 mm  Liver: No focal lesion identified. Within normal limits in parenchymal echogenicity.  IVC: No abnormality visualized.  Pancreas: There is limited visualization of the pancreas because of overlying bowel gas.  Spleen: Limited visualization of the  spleen because of bowel gas.  Right Kidney: Length: 10.8 cm. Echogenic with renal parenchymal thinning noted. No focal mass. No hydronephrosis.  Left Kidney: Length: 12.1 cm. Echogenic with renal parenchymal thinning noted. Small upper pole cyst 1.4 cm. No hydronephrosis.  Abdominal aorta: The abdominal aorta is not well seen because of bowel gas.  Other findings: Study is technically difficult because of significant overlying bowel gas.  IMPRESSION: 1. No evidence for acute cholecystitis. 2. Portions of the abdomen are obscured by bowel gas. 3. Echogenic bilateral renal parenchyma. 4. No hydronephrosis.   Electronically Signed   By: Shon Hale M.D.   On: 11/13/2014 20:42   US Thoracentesis Asp Pleural Space W/img Guide  11/14/2014   INDICATION: Symptomatic right sided pleural effusion  EXAM: US THORACENTESIS ASP PLEURAL SPACE W/IMG GUIDE  COMPARISON:  None  MEDICATIONS: 10 cc 1% lidocaine  COMPLICATIONS: None immediate  TECHNIQUE: Informed written consent was obtained from the patient after a discussion of the risks, benefits and alternatives to treatment. A timeout was performed prior to the initiation of the procedure.  Initial ultrasound scanning demonstrates a left pleural effusion. The lower chest was prepped and draped in the usual sterile fashion. 1% lidocaine was used for local anesthesia.  Under direct ultrasound guidance, a 19 gauge, 7-cm, Yueh catheter was introduced. An ultrasound image  was saved for documentation purposes. The thoracentesis was performed. The catheter was removed and a dressing was applied. The patient tolerated the procedure well without immediate post procedural complication. The patient was escorted to have an upright chest radiograph.  FINDINGS: A total of approximately 1 liters of bloody fluid was removed. Requested samples were sent to the laboratory.  IMPRESSION: Successful ultrasound-guided left sided thoracentesis yielding 1 liters of pleural fluid.  Read by:  Lavonia Drafts [facility]   Electronically Signed   By: Daryll Brod M.D.   On: 11/14/2014 14:25    Scheduled Meds: . antiseptic oral rinse  7 mL Mouth Rinse BID  . carbidopa-levodopa  1 tablet Oral BID  . ceFEPime (MAXIPIME) IV  2 g Intravenous Q24H  . furosemide  20 mg Oral Daily  . gabapentin  200 mg Oral QHS  . heparin subcutaneous  5,000 Units Subcutaneous 3 times per day  . insulin aspart  0-15 Units Subcutaneous TID WC  . insulin aspart  0-5 Units Subcutaneous QHS  . insulin glargine  50 Units Subcutaneous QHS  . levothyroxine  100 mcg Oral QHS  . lidocaine (PF)      . metoprolol  50 mg Oral q morning - 10a  . polyethylene glycol  17 g Oral Daily  . pravastatin  20 mg Oral q1800  . sertraline  25 mg Oral q morning - 10a  . sodium chloride  3 mL Intravenous Q12H  . sodium chloride  3 mL Intravenous Q12H  . tamsulosin  0.4 mg Oral QPC supper  . vancomycin  1,500 mg Intravenous Q24H   Continuous Infusions:   Principal Problem:   Pleural effusion Active Problems:   Shortness of breath   Hypothyroidism   Atrial fibrillation   Chronic right-sided HF (heart failure)   COPD with emphysema, mild   CKD (chronic kidney disease) stage 3, GFR 30-59 ml/min   Pleural effusion on right    Time spent: 35 min    Kelvin Cellar  [facility] Pager [phone]. If 7PM-7AM, please contact night-coverage at [email or web address], password [facility] 11/14/2014, 4:18 PM  LOS: 2 days

## 2014-11-14 NOTE — Plan of Care (Signed)
Problem: Phase I Progression Outcomes Goal: Hemodynamically stable Outcome: Progressing     

## 2014-11-14 NOTE — Telephone Encounter (Signed)
Terri, pt's daughter called stating that Dr. Posey Pronto called 3 days ago and she is returning the call. Pt was in the hospital. C/B [phone]

## 2014-11-14 NOTE — Telephone Encounter (Signed)
I didn't call him, maybe related to his chest pain?

## 2014-11-14 NOTE — Progress Notes (Signed)
ANTICOAGULATION CONSULT NOTE - Initial Consult  Pharmacy Consult for Coumadin Indication: atrial fibrillation  Allergies  Allergen Reactions  . Penicillins Rash    Happened 60 years ago    Patient Measurements: Height: 6' (182.9 cm) Weight: 216 lb 4.3 oz (98.1 kg) (SCAle A) IBW/kg (Calculated) : 77.6  Vital Signs: Temp: 98.2 F (36.8 C) (12/11 1616) Temp Source: Oral (12/11 1616) BP: 113/64 mmHg (12/11 1616) Pulse Rate: 91 (12/11 1616)  Labs:  Recent Labs  11/12/14 0941 11/12/14 1236 11/13/14 0650 11/14/14 0427 11/14/14 0643  HGB 11.3*  --  10.7* 11.1*  --   HCT 35.8*  --  34.7* 36.2*  --   PLT 204  --  193 202  --   LABPROT  --  27.2* 26.0*  --  19.3*  INR  --  2.49* 2.35*  --  1.61*  CREATININE 2.33*  --  2.23* 2.18*  --     Estimated Creatinine Clearance: 32.3 mL/min (by C-G formula based on Cr of 2.18).   Medical History: Past Medical History  Diagnosis Date  . Family history of malignant neoplasm of gastrointestinal tract   . Unspecified constipation   . Abnormal involuntary movements(781.0)   . Vitamin B12 deficiency   . Claudication   . Hypertrophy of prostate with urinary obstruction and other lower urinary tract symptoms (LUTS)   . Obesity   . Mixed hyperlipidemia   . Atrial fibrillation     --myoview 07/2006: not gated. No ischemia or infarct  Diastolic HF-- Echo 02/8755 ER60% mild AI/MR. RV mild to moderately dilated/ dysfunction. ? restrictive CM . RVSP 36  . Bladder polyps   . Kidney stones   . Unspecified sleep apnea     NPSG 05/14/2007- AHI 80.7/ hr  . OSA (obstructive sleep apnea)     "tried mask; couldn't use it; so I quit" (11/12/2014)  . Hypothyroidism   . Type II or unspecified type diabetes mellitus with neurological manifestations, not stated as uncontrolled   . History of hiatal hernia   . Daily headache     "I've had headaches all my life; get them almost daily" (11/12/2014)  . Stroke ~ 1985    "simple stroke" denies residual on  11/12/2014  . Arthritis     "just about qwhere"   . Pleural effusion 11/12/2014 admission    Assessment: 83 YOM on coumadin PTA for afib, s/p thoracentesis today, coumadin has been on hold, pt also received vitamin K 5mg  po x 2 on 12/9 and 12/10, INR 1.61 this morning. Now to resume coumadin after procedure. Hgb 11.1, plt 202.  PTA dose: 2.5 mg on Sun, Tues, Fri, 5mg  on all other days  Goal of Therapy:  INR 2-3 Monitor platelets by anticoagulation protocol: Yes   Plan:  - Coumadin 5mg  po x 1 - f/u daily PT/INR  Maryanna Shape, PharmD, BCPS  Clinical Pharmacist  Pager: [phone]   11/14/2014,5:16 PM

## 2014-11-14 NOTE — Evaluation (Signed)
Physical Therapy Evaluation Patient Details Name: [name] [name] MRN: [id number] DOB: 05-22-1931 Today's Date: 11/14/2014   History of Present Illness  Patient is a 80 y/o male admitted with SOB and cough. CT scan of lungs showed moderate to large right-sided pleural effusion with right lower lobe collapse/consolidation. S/p thoracentesis on right w/ removal of 1L of fluid 12/11. PMH of A-fib on anticoagulation with warfarin, insulin-dependent diabetes mellitus, Parkinson's disease and dyslipidemia.    Clinical Impression  Patient presents with functional limitations due to deficits listed in PT problem list (see below). Pt with dyspnea on exertion, impaired endurance and balance deficits impacting safe mobility. Recommend continued use of RW for all mobility to prevent falls. Education provided on energy conservation techniques and pursed lip breathing.  Pt would benefit from skilled PT to improve gait, balance and mobility so pt can maximize independence and wean 02 prior to return home.     Follow Up Recommendations Supervision/Assistance - 24 hour;Outpatient PT (Pt reports going to OPPT, if so, continue therapy. If not, HHPT)    Equipment Recommendations  None recommended by PT    Recommendations for Other Services       Precautions / Restrictions Precautions Precautions: Fall Precaution Comments: monitor 02 Restrictions Weight Bearing Restrictions: No      Mobility  Bed Mobility Overal bed mobility: Modified Independent;Needs Assistance Bed Mobility: Supine to Sit;Sit to Supine     Supine to sit: Modified independent (Device/Increase time);HOB elevated Sit to supine: Modified independent (Device/Increase time);HOB elevated   General bed mobility comments: HOB elevated 30 degrees and use of rails.  Transfers Overall transfer level: Needs assistance Equipment used: Rolling walker (2 wheeled) Transfers: Sit to/from Stand Sit to Stand: Supervision         General  transfer comment: Supervision for safety and management of 02 tubing.  Ambulation/Gait Ambulation/Gait assistance: Min assist Ambulation Distance (Feet): 150 Feet Assistive device: None Gait Pattern/deviations: Step-through pattern;Shuffle;Decreased stride length;Trunk flexed   Gait velocity interpretation: Below normal speed for age/gender General Gait Details: Pt with slow, shuffling gait with mild unsteadiness. Forward lean resulting in increased gait speed at times and momentum. Min A for balance at times as pt not using AD. Standing rest breaks due to dyspnea. Ambulated on 2L 02 [place]. Sa02 stayed >94% .  Stairs            Wheelchair Mobility    Modified Rankin (Stroke Patients Only)       Balance Overall balance assessment: Needs assistance;History of Falls Sitting-balance support: Feet supported;No upper extremity supported Sitting balance-Leahy Scale: Fair Sitting balance - Comments: Requires total A to donn socks (premorbid). Able to reach outside BoS and fix sock at end of session without LOB.   Standing balance support: During functional activity Standing balance-Leahy Scale: Fair Standing balance comment: Tolerates static standing without UE support, requires UE support PRN during dynamic standing for balance/safety.                             Pertinent Vitals/Pain Pain Assessment: No/denies pain    Home Living Family/patient expects to be discharged to:: Private residence Living Arrangements: Spouse/significant other Available Help at Discharge: Family;Available 24 hours/day Type of Home: House Home Access: Level entry     Home Layout: One level Home Equipment: Walker - 2 wheels;Cane - single point;Grab bars - tub/shower      Prior Function Level of Independence: Needs assistance      ADL's /  Homemaking Assistance Needed: Wife assists with dressing and bathing. Wife drives.  Comments: Pt uses SPC for community ambulation and RW for  household ambulation. Reports 1 fall ~2 months ago.     Hand Dominance   Dominant Hand: Right    Extremity/Trunk Assessment   Upper Extremity Assessment: RUE deficits/detail RUE Deficits / Details: Limited AROM shoulder elevation after fall ~2 months ago.          Lower Extremity Assessment: Generalized weakness;RLE deficits/detail;LLE deficits/detail         Communication   Communication: No difficulties  Cognition Arousal/Alertness: Awake/alert Behavior During Therapy: WFL for tasks assessed/performed Overall Cognitive Status: Within Functional Limits for tasks assessed                      General Comments      Exercises        Assessment/Plan    PT Assessment Patient needs continued PT services  PT Diagnosis Generalized weakness;Abnormality of gait   PT Problem List Decreased strength;Cardiopulmonary status limiting activity;Impaired sensation;Decreased activity tolerance;Decreased balance;Decreased mobility  PT Treatment Interventions Balance training;Gait training;Patient/family education;Functional mobility training;Therapeutic activities;Therapeutic exercise;Neuromuscular re-education   PT Goals (Current goals can be found in the Care Plan section) Acute Rehab PT Goals Patient Stated Goal: to return home and continue with OPPT PT Goal Formulation: With patient Time For Goal Achievement: 11/28/14 Potential to Achieve Goals: Good    Frequency Min 3X/week   Barriers to discharge        Co-evaluation               End of Session Equipment Utilized During Treatment: Gait belt Activity Tolerance: Patient tolerated treatment well Patient left: in bed;with call bell/phone within reach Nurse Communication: Mobility status         Time: 4944-9675 PT Time Calculation (min) (ACUTE ONLY): 28 min   Charges:   PT Evaluation $Initial PT Evaluation Tier I: 1 Procedure PT Treatments $Gait Training: 8-22 mins   PT G CodesCandy Sledge A 11/14/2014, 5:05 PM  Candy Sledge, [place], DPT [phone]

## 2014-11-15 ENCOUNTER — Inpatient Hospital Stay (HOSPITAL_COMMUNITY): Payer: Medicare Other

## 2014-11-15 LAB — BASIC METABOLIC PANEL
Anion gap: 10 (ref 5–15)
BUN: 75 mg/dL — ABNORMAL HIGH (ref 6–23)
CO2: 30 meq/L (ref 19–32)
Calcium: 9.9 mg/dL (ref 8.4–10.5)
Chloride: 106 mEq/L (ref 96–112)
Creatinine, Ser: 2.2 mg/dL — ABNORMAL HIGH (ref 0.50–1.35)
GFR calc Af Amer: 31 mL/min — ABNORMAL LOW (ref >90)
GFR, EST NON AFRICAN AMERICAN: 26 mL/min — AB (ref >90)
Glucose, Bld: 139 mg/dL — ABNORMAL HIGH (ref 70–99)
POTASSIUM: 4.5 meq/L (ref 3.7–5.3)
SODIUM: 146 meq/L (ref 137–147)

## 2014-11-15 LAB — GLUCOSE, CAPILLARY
GLUCOSE-CAPILLARY: 163 mg/dL — AB (ref 70–99)
Glucose-Capillary: 144 mg/dL — ABNORMAL HIGH (ref 70–99)
Glucose-Capillary: 170 mg/dL — ABNORMAL HIGH (ref 70–99)

## 2014-11-15 LAB — CBC
HCT: 34.5 % — ABNORMAL LOW (ref 39.0–52.0)
Hemoglobin: 10.5 g/dL — ABNORMAL LOW (ref 13.0–17.0)
MCH: 28.2 pg (ref 26.0–34.0)
MCHC: 30.4 g/dL (ref 30.0–36.0)
MCV: 92.7 fL (ref 78.0–100.0)
PLATELETS: 189 10*3/uL (ref 150–400)
RBC: 3.72 MIL/uL — AB (ref 4.22–5.81)
RDW: 15.2 % (ref 11.5–15.5)
WBC: 6.5 10*3/uL (ref 4.0–10.5)

## 2014-11-15 LAB — PROTIME-INR
INR: 1.48 (ref 0.00–1.49)
Prothrombin Time: 18 seconds — ABNORMAL HIGH (ref 11.6–15.2)

## 2014-11-15 MED ORDER — VANCOMYCIN HCL 10 G IV SOLR
1500.0000 mg | INTRAVENOUS | Status: DC
  Filled 2014-11-15: qty 1500

## 2014-11-15 MED ORDER — INSULIN GLARGINE 100 UNIT/ML ~~LOC~~ SOLN: 50.0000 [IU] | Freq: Every day | SUBCUTANEOUS | Status: DC

## 2014-11-15 MED ORDER — WARFARIN SODIUM 7.5 MG PO TABS
7.5000 mg | ORAL_TABLET | Freq: Once | ORAL | Status: DC
  Filled 2014-11-15: qty 1

## 2014-11-15 MED ORDER — CEFUROXIME AXETIL 250 MG PO TABS: 250.0000 mg | ORAL_TABLET | Freq: Two times a day (BID) | ORAL | Status: DC

## 2014-11-15 MED ORDER — ENOXAPARIN SODIUM 100 MG/ML ~~LOC~~ SOLN: 100.0000 mg | SUBCUTANEOUS | Status: DC

## 2014-11-15 NOTE — Clinical Documentation Improvement (Signed)
Query #1 Both Diabetes Mellitus Type 1 and Diabetes Mellitus Type 2 are documented in the current medical record.  Please clarify the type of Diabetes Mellitus in the progress notes and discharge summary.   Query #2 "Chronic Right-Sided Heart Failure" documented in the current medical record.  2D Echo completed this admission 11/13/14.  Please clarify if "Chronic Right-Sided Heart Failure" and be further specified as "Chronic Systolic" or "Chronic Diastolic" or if unable to be clinically determined.   Thank You, Erling Conte ,RN Clinical Documentation Specialist:  [phone] [place] [employer]

## 2014-11-15 NOTE — Progress Notes (Addendum)
Patient is going home with wife. DC IV, DC tele, all DC instructions explained, patient and wife both verbalize understanding.

## 2014-11-15 NOTE — Progress Notes (Signed)
ANTICOAGULATION CONSULT NOTE - Follow Up Consult  Pharmacy Consult:  Coumadin Indication: atrial fibrillation  Allergies  Allergen Reactions  . Penicillins Rash    Happened 60 years ago    Patient Measurements: Height: 6' (182.9 cm) Weight: 215 lb 12 oz (97.864 kg) (Scale A) IBW/kg (Calculated) : 77.6  Vital Signs: Temp: 98.2 F (36.8 C) (12/12 0509) Temp Source: Oral (12/12 0509) BP: 110/67 mmHg (12/12 0509) Pulse Rate: 88 (12/12 0509)  Labs:  Recent Labs  11/13/14 0650 11/14/14 0427 11/14/14 0643 11/15/14 0329  HGB 10.7* 11.1*  --  10.5*  HCT 34.7* 36.2*  --  34.5*  PLT 193 202  --  189  LABPROT 26.0*  --  19.3* 18.0*  INR 2.35*  --  1.61* 1.48  CREATININE 2.23* 2.18*  --  2.20*    Estimated Creatinine Clearance: 31.9 mL/min (by C-G formula based on Cr of 2.2).    Assessment: 83 YOM on Coumadin PTA for history of AFib.  Coumadin held on admit and reversed with Vitamin K for thoracentesis.  Heparin SQ was started for VTE px in the meantime.  Coumadin now resumed and INR is sub-therapeutic.  It may take longer for INR to be therapeutic again given the use of Vitamin K.  No bleeding reported.  Home dose:  5mg  daily except 2.5 on Tue / Fri / Sun   Goal of Therapy:  INR 2-3 Vanc trough 15-20 mcg/mL    Plan:  - Coumadin 7.5mg  PO today - Continue heparin SQ until INR therapeutic - Daily PT / INR - Continue Vanc 1500mg  IV Q24H and Cefepime 2gm IV Q24H - Monitor renal fxn, clinical progress, vanc trough as indicated    Denzell Colasanti D. Mina Marble, PharmD, BCPS Pager:  [phone] 11/15/2014, 10:21 AM

## 2014-11-15 NOTE — Discharge Summary (Signed)
Physician Discharge Summary  Arthor Captain Trentham OAC:[id number] DOB: [birth year] DOA: 11/12/2014  PCP: Nyoka Cowden, MD  Admit date: 11/12/2014 Discharge date: 11/15/2014  Time spent: 35 minutes  Recommendations for Outpatient Follow-up:  1. PLEASE FOLLOW UP ON PLEURAL FLUID PATHOLOGY. I spoke with is Pulmonologist Dr Annamaria Boots prior to discharge.  2. Follow up on BMP and CBC in 1 week 3. Follow up on PT/INR on Monday 11/17/2014, Coumadin was reversed so that he could undergo thoracentesis. On day of discharge he had an INR of 1.48, give his significant risk of thromboembolism in setting of Afib, he was discharged on Lovenox for 5 days.  4. Home Health Services set up for PT  Discharge Diagnoses:  Principal Problem:   Pleural effusion Active Problems:   Shortness of breath   Hypothyroidism   Atrial fibrillation   Chronic right-sided HF (heart failure)   COPD with emphysema, mild   CKD (chronic kidney disease) stage 3, GFR 30-59 ml/min   Pleural effusion on right   Discharge Condition: Stable/Improved  Diet recommendation: Heart healthy  Filed Weights   11/13/14 0536 11/14/14 0557 11/15/14 0509  Weight: 99.247 kg (218 lb 12.8 oz) 98.1 kg (216 lb 4.3 oz) 97.864 kg (215 lb 12 oz)    History of present illness:  [name] [name] is a 80 y.o. male with multiple medical conditions including atrial fibrillation currently on anticoagulation with warfarin, insulin-dependent diabetes mellitus, Parkinson's disease, dyslipidemia presenting to the emergency department with complaints of shortness of breath and cough. He states having shortness of breath intermittently in the past however symptoms worsening over the weekend and continuing until today. Shortness of breath is worse at nighttime, particularly when laying flat. He complains of associated cough without sputum production. He denies accompanying chest pain, fevers, chills, nausea, vomiting, wheezing, hemoptysis, abdominal pain or palpitations.  He denies having a history of pleural effusions or requiring thoracentesis in his past. Workup in the emergency department included a CT scan of lungs that showed moderate to large right-sided pleural effusion with right lower lobe collapse/consolidation. He has a 30-pack-year history of smoking.  Hospital Course:  Patient is a pleasant 80 year old with a past medical history of atrial fibrillation on chronic anticoagulation with warfarin, insulin dependent diabetes mellitus, Parkinson's disease who was admitted to the medicine service on 11/12/2014. He presented with complaints of shortness of breath with associated cough. Workup initially performed in the emergency department revealed moderate to large right-sided pleural effusion with presence of right lower lobe collapse/consolidation. He was admitted to telemetry. Given the need for thoracentesis he was administered 5 mg of vitamin K per pharmacy's recommendations. A.m. lab work showing INR of 2.35 for which he was administered 5 mg of vitamin K today. He was unable to undergo thoracentesis given elevated INR. On 11/14/2014 he underwent ultrasound-guided thoracentesis with removal of 1 L of fluid. Fluid analysis consistent with exudative effusion based on Lytes criteria. Given his history of smoking malignancy is a concern. Pathology of pleural fluid still pending at the time of this dictation. On the following day he had a repeat chest x-ray which did not reveal reaccumulation of pleural effusion. I spoke with his pulmonologist Dr Annamaria Boots regarding the need to follow-up on pathology report. He was discharged in stable condition to his home on 11/15/2014 with health services to provide home PT.   Procedures:  Ultrasound-guided thoracentesis performed on 11/14/2014 with removal 1 L of pleural fluid   Discharge Exam: Filed Vitals:   11/15/14 0509  BP:  110/67  Pulse: 88  Temp: 98.2 F (36.8 C)  Resp: 18    General: Patient is awake, alert,  reports feeling little better today. Cardiovascular: Regular rate and rhythm normal S1-S2 no murmurs rubs or gallops Respiratory: Improved air movement to right lung, normal respiratory effort  Discharge Instructions You were cared for by a hospitalist during your hospital stay. If you have any questions about your discharge medications or the care you received while you were in the hospital after you are discharged, you can call the unit and asked to speak with the hospitalist on call if the hospitalist that took care of you is not available. Once you are discharged, your primary care physician will handle any further medical issues. Please note that NO REFILLS for any discharge medications will be authorized once you are discharged, as it is imperative that you return to your primary care physician (or establish a relationship with a primary care physician if you do not have one) for your aftercare needs so that they can reassess your need for medications and monitor your lab values.  Discharge Instructions    (HEART FAILURE PATIENTS) Call MD:  Anytime you have any of the following symptoms: 1) 3 pound weight gain in 24 hours or 5 pounds in 1 week 2) shortness of breath, with or without a dry hacking cough 3) swelling in the hands, feet or stomach 4) if you have to sleep on extra pillows at night in order to breathe.    Complete by:  As directed      Call MD for:  difficulty breathing, headache or visual disturbances    Complete by:  As directed      Call MD for:  extreme fatigue    Complete by:  As directed      Call MD for:  hives    Complete by:  As directed      Call MD for:  persistant dizziness or light-headedness    Complete by:  As directed      Call MD for:  persistant nausea and vomiting    Complete by:  As directed      Call MD for:  redness, tenderness, or signs of infection (pain, swelling, redness, odor or green/yellow discharge around incision site)    Complete by:  As directed       Call MD for:  severe uncontrolled pain    Complete by:  As directed      Call MD for:  temperature >100.4    Complete by:  As directed      Diet - low sodium heart healthy    Complete by:  As directed      Discharge instructions    Complete by:  As directed   Please have you PT/INR checked on Monday You will need to follow up with your Pulmonologist Dr Annamaria Boots this week regarding pathology from the lung fluid     Increase activity slowly    Complete by:  As directed           Current Discharge Medication List    START taking these medications   Details  cefUROXime (CEFTIN) 250 MG tablet Take 1 tablet (250 mg total) by mouth 2 (two) times daily with a meal. Qty: 10 tablet, Refills: 0    insulin glargine (LANTUS) 100 UNIT/ML injection Inject 0.5 mLs (50 Units total) into the skin at bedtime. Qty: 10 mL, Refills: 11      CONTINUE these medications which have  NOT CHANGED   Details  carbidopa-levodopa (SINEMET IR) 25-100 MG per tablet Take 1 tablet by mouth 2 (two) times daily. Qty: 180 tablet, Refills: 3    diazepam (VALIUM) 2 MG tablet Take 1 tablet (2 mg total) by mouth 2 (two) times daily as needed for anxiety. Qty: 60 tablet, Refills: 5    furosemide (LASIX) 20 MG tablet 1 tablet Monday, Wednesday, Friday only Qty: 90 tablet, Refills: 3    gabapentin (NEURONTIN) 100 MG capsule Take 200 mg by mouth every evening. 1 at bedtime.    levothyroxine (SYNTHROID, LEVOTHROID) 100 MCG tablet TAKE 1 TABLET BY MOUTH DAILY Qty: 90 tablet, Refills: 3    lovastatin (MEVACOR) 20 MG tablet TAKE 1 TABLET BY MOUTH DAILY Qty: 30 tablet, Refills: 5    metoprolol (TOPROL-XL) 100 MG 24 hr tablet 1 half tablet daily Qty: 90 tablet, Refills: 3    polyethylene glycol (MIRALAX) powder Take 17 g by mouth at bedtime. For regularity     sertraline (ZOLOFT) 25 MG tablet Take 1 tablet (25 mg total) by mouth daily. Qty: 30 tablet, Refills: 11   Associated Diagnoses: Abnormal involuntary  movements(781.0)    Tamsulosin HCl (FLOMAX) 0.4 MG CAPS Take 0.4 mg by mouth daily after supper.     Thiamine HCl (VITAMIN B-1 PO) Take 1 capsule by mouth 2 (two) times daily.    trospium (SANCTURA) 20 MG tablet Take 20 mg by mouth 2 (two) times daily.    warfarin (COUMADIN) 5 MG tablet Take as directed by anticoagulation clinic Qty: 105 tablet, Refills: 1    glucose blood test strip 1 each by Other route daily as needed for other. Qty: 100 each, Refills: 12    Insulin Pen Needle (PEN NEEDLES 31GX5/16") 31G X 8 MM MISC 1 each by Does not apply route as directed. Qty: 100 each, Refills: 1    Lancets (ONETOUCH ULTRASOFT) lancets 1 each by Other route daily as needed for other. Qty: 100 each, Refills: 12      STOP taking these medications     acetaminophen (TYLENOL) 325 MG tablet      Insulin Glargine (LANTUS SOLOSTAR) 100 UNIT/ML Solostar Pen        Allergies  Allergen Reactions  . Penicillins Rash    Happened 60 years ago   Follow-up Information    Follow up with Deneise Lever, MD In 1 week.   Specialty:  Pulmonary Disease   Contact information:   [address]  [facility] [place] [address] [phone]       Follow up with Nyoka Cowden, MD In 1 week.   Specialty:  Internal Medicine   Contact information:   [place] [address] [phone]        The results of significant diagnostics from this hospitalization (including imaging, microbiology, ancillary and laboratory) are listed below for reference.    Significant Diagnostic Studies: Dg Chest 1 View  11/14/2014   CLINICAL DATA:  80 year old male status post right-sided thoracentesis.  EXAM: CHEST - 1 VIEW  COMPARISON:  Chest CT 11/12/2014 and earlier.  FINDINGS: Portable AP upright view at 1423 hrs. No pneumothorax. Regressed veiling opacity at the right lung base and mildly improved right lung base ventilation. Residual pleural fluid tracking into  the minor fissure. Stable cardiomegaly and mediastinal contours. Left lung is stable. Visualized tracheal air column is within normal limits.  IMPRESSION: No pneumothorax, regressed right pleural effusion, and mildly improved right lung base ventilation following  thoracentesis.   Electronically Signed   By: Lars Pinks M.D.   On: 11/14/2014 14:35   Dg Chest 2 View  11/15/2014   CLINICAL DATA:  Atrial fibrillation.  Pleural effusion.  EXAM: CHEST  2 VIEW  COMPARISON:  11/14/2014  FINDINGS: Small right pleural effusion and right basilar atelectasis shows no significant change. Left lung remains clear. Heart size is stable.  IMPRESSION: No significant change in small right pleural effusion and right basilar atelectasis.   Electronically Signed   By: Earle Gell M.D.   On: 11/15/2014 09:55   Ct Chest Wo Contrast  11/12/2014   CLINICAL DATA:  Initial encounter for three day history of worsening shortness of breath.  EXAM: CT CHEST WITHOUT CONTRAST  TECHNIQUE: Multidetector CT imaging of the chest was performed following the standard protocol without IV contrast.  COMPARISON:  None.  FINDINGS: Soft tissue / Mediastinum: There is no axillary lymphadenopathy. Scattered small mediastinal lymph nodes are evident and borderline enlarged. No gross hilar lymphadenopathy. The heart is enlarged. Coronary artery calcification is noted. No pericardial effusion.  Lungs / Pleura: There is a moderate to large right pleural effusion associated with right lower lobe collapse/ consolidation. This is difficult to further characterize given the lack of intravenous contrast. There is some mild dependent atelectasis in the left lung.  Bones: Bone windows reveal no worrisome lytic or sclerotic osseous lesions.  Upper Abdomen: Liver contour appears nodular, raising the question of, but not diagnostic for cirrhosis. No evidence for adrenal nodule or mass.  IMPRESSION: Moderate to large right pleural effusion with right lower lobe collapse/  consolidation. Limited study given the lack of intravenous contrast material. Nevertheless, given the unilateral disease, malignancy must be considered.   Electronically Signed   By: Misty Stanley M.D.   On: 11/12/2014 15:28   US Abdomen Complete  11/13/2014   CLINICAL DATA:  Elevated alkaline phosphatase. Previous appendectomy and stones excision. BPH, renal stones, diabetes.  EXAM: ULTRASOUND ABDOMEN COMPLETE  COMPARISON:  CT of the abdomen and pelvis 10/12/2006  FINDINGS: Gallbladder: Gallbladder has a normal appearance. Gallbladder wall is 2.4 mm, within normal limits. No stones or pericholecystic fluid. No sonographic Murphy's sign.  Common bile duct: Diameter: 4.0 mm  Liver: No focal lesion identified. Within normal limits in parenchymal echogenicity.  IVC: No abnormality visualized.  Pancreas: There is limited visualization of the pancreas because of overlying bowel gas.  Spleen: Limited visualization of the spleen because of bowel gas.  Right Kidney: Length: 10.8 cm. Echogenic with renal parenchymal thinning noted. No focal mass. No hydronephrosis.  Left Kidney: Length: 12.1 cm. Echogenic with renal parenchymal thinning noted. Small upper pole cyst 1.4 cm. No hydronephrosis.  Abdominal aorta: The abdominal aorta is not well seen because of bowel gas.  Other findings: Study is technically difficult because of significant overlying bowel gas.  IMPRESSION: 1. No evidence for acute cholecystitis. 2. Portions of the abdomen are obscured by bowel gas. 3. Echogenic bilateral renal parenchyma. 4. No hydronephrosis.   Electronically Signed   By: Shon Hale M.D.   On: 11/13/2014 20:42   Dg Chest Port 1 View  11/12/2014   CLINICAL DATA:  Shortness of breath for 3 days.  EXAM: PORTABLE CHEST - 1 VIEW  COMPARISON:  05/16/2013.  FINDINGS: Mediastinum and hilar structures normal. Cardiomegaly with pulmonary venous congestion and bilateral pulmonary infiltrates. Particularly prominent on the right. Right pleural  effusion. These findings are consistent with congestive heart failure. Superimposed pneumonia particularly in the right  lung base cannot be excluded. No pneumothorax. No acute bony abnormality.  IMPRESSION: Congestive heart failure with pulmonary edema and right-sided pleural effusion. Superimposed pneumonia particularly in the right lung base cannot be excluded.   Electronically Signed   By: Marcello Moores  Register   On: 11/12/2014 12:04   US Thoracentesis Asp Pleural Space W/img Guide  11/14/2014   INDICATION: Symptomatic right sided pleural effusion  EXAM: US THORACENTESIS ASP PLEURAL SPACE W/IMG GUIDE  COMPARISON:  None  MEDICATIONS: 10 cc 1% lidocaine  COMPLICATIONS: None immediate  TECHNIQUE: Informed written consent was obtained from the patient after a discussion of the risks, benefits and alternatives to treatment. A timeout was performed prior to the initiation of the procedure.  Initial ultrasound scanning demonstrates a left pleural effusion. The lower chest was prepped and draped in the usual sterile fashion. 1% lidocaine was used for local anesthesia.  Under direct ultrasound guidance, a 19 gauge, 7-cm, Yueh catheter was introduced. An ultrasound image was saved for documentation purposes. The thoracentesis was performed. The catheter was removed and a dressing was applied. The patient tolerated the procedure well without immediate post procedural complication. The patient was escorted to have an upright chest radiograph.  FINDINGS: A total of approximately 1 liters of bloody fluid was removed. Requested samples were sent to the laboratory.  IMPRESSION: Successful ultrasound-guided left sided thoracentesis yielding 1 liters of pleural fluid.  Read by:  Lavonia Drafts [facility]   Electronically Signed   By: Daryll Brod M.D.   On: 11/14/2014 14:25    Microbiology: No results found for this or any previous visit (from the past 240 hour(s)).   Labs: Basic Metabolic Panel:  Recent Labs Lab  11/12/14 0941 11/13/14 0650 11/14/14 0427 11/15/14 0329  NA 148* 143 148* 146  K 5.0 4.3 4.7 4.5  CL 106 103 106 106  CO2 28 27 31 30   GLUCOSE 94 110* 119* 139*  BUN 86* 76* 76* 75*  CREATININE 2.33* 2.23* 2.18* 2.20*  CALCIUM 10.5 9.9 10.1 9.9   Liver Function Tests:  Recent Labs Lab 11/12/14 0941  AST 39*  ALT 19  ALKPHOS 278*  BILITOT 0.4  PROT 7.9  ALBUMIN 3.6   No results for input(s): LIPASE, AMYLASE in the last 168 hours. No results for input(s): AMMONIA in the last 168 hours. CBC:  Recent Labs Lab 11/12/14 0941 11/13/14 0650 11/14/14 0427 11/15/14 0329  WBC 6.8 7.1 7.1 6.5  NEUTROABS 4.3  --   --   --   HGB 11.3* 10.7* 11.1* 10.5*  HCT 35.8* 34.7* 36.2* 34.5*  MCV 93.5 92.8 92.8 92.7  PLT 204 193 202 189   Cardiac Enzymes: No results for input(s): CKTOTAL, CKMB, CKMBINDEX, TROPONINI in the last 168 hours. BNP: BNP (last 3 results)  Recent Labs  11/12/14 0941  PROBNP 440.4   CBG:  Recent Labs Lab 11/14/14 1123 11/14/14 1607 11/14/14 2102 11/15/14 0607 11/15/14 0645  GLUCAP 159* 142* 127* 163* 144*       Signed:  Geneve Kimpel  [facility] 11/15/2014, 10:32 AM    b

## 2014-11-17 ENCOUNTER — Telehealth: Payer: Self-pay | Admitting: Internal Medicine

## 2014-11-17 NOTE — Telephone Encounter (Signed)
danielle is calling to report pt inr 1.8 protime 22.6

## 2014-11-18 ENCOUNTER — Ambulatory Visit: Payer: Medicare Other | Admitting: Physical Therapy

## 2014-11-18 ENCOUNTER — Ambulatory Visit (INDEPENDENT_AMBULATORY_CARE_PROVIDER_SITE_OTHER): Payer: Medicare Other | Admitting: Family

## 2014-11-18 DIAGNOSIS — I482 Chronic atrial fibrillation, unspecified: Secondary | ICD-10-CM

## 2014-11-18 DIAGNOSIS — Z5181 Encounter for therapeutic drug level monitoring: Secondary | ICD-10-CM

## 2014-11-18 LAB — BODY FLUID CULTURE
Culture: NO GROWTH
Gram Stain: NONE SEEN

## 2014-11-18 LAB — POCT INR: INR: 1.8

## 2014-11-18 NOTE — Telephone Encounter (Signed)
Take an extra 1/2 tab today then recheck in 2 weeks.

## 2014-11-18 NOTE — Patient Instructions (Signed)
Take an extra 1/2 tab today only. Take 1 tablet daily except 1/2 tablet on Sundays/Tuesdays/Fridays. Recheck in 2 weeks.   Anticoagulation Dose Instructions as of 11/18/2014      [name] [name] Tue Wed Thu Fri Sat   New Dose 2.5 mg 5 mg 2.5 mg 5 mg 5 mg 2.5 mg 5 mg    Description        Take an extra 1/2 tab today only. Take 1 tablet daily except 1/2 tablet on Sundays/Tuesdays/Fridays. Recheck in 2 weeks.

## 2014-11-18 NOTE — Telephone Encounter (Signed)
Pt aware of Padonda's note and left message to advise Andee Poles of note as well

## 2014-11-19 ENCOUNTER — Ambulatory Visit: Payer: Medicare Other

## 2014-11-19 LAB — POCT INR: INR: 1.7

## 2014-11-19 NOTE — Progress Notes (Signed)
Almyra Free, RN from [facility] called to report patient's INR today was 1.7 and he is scheduled to take his last dose of Lovenox tonight.  Called Roxy Cedar, FNP for recommendations, given that she gave recommendations for an INR 1.8 on Monday.  Padonda advised for patient take last dose of Lovenox tonight, take an extra 1/2 tablet tonight, then resume coumadin as scheduled, and recheck INR 1 week.  The same was reported to Almyra Free, RN who stated that she would call patient and make him aware.

## 2014-11-20 ENCOUNTER — Ambulatory Visit: Payer: Medicare Other | Admitting: Physical Therapy

## 2014-11-24 ENCOUNTER — Other Ambulatory Visit: Payer: Medicare Other

## 2014-11-24 ENCOUNTER — Encounter: Payer: Self-pay | Admitting: Internal Medicine

## 2014-11-24 ENCOUNTER — Ambulatory Visit (INDEPENDENT_AMBULATORY_CARE_PROVIDER_SITE_OTHER)
Admission: RE | Admit: 2014-11-24 | Discharge: 2014-11-24 | Disposition: A | Discharge status: Home / Self Care | Payer: Medicare Other | Source: Ambulatory Visit | Attending: Internal Medicine | Admitting: Internal Medicine

## 2014-11-24 ENCOUNTER — Ambulatory Visit (INDEPENDENT_AMBULATORY_CARE_PROVIDER_SITE_OTHER): Payer: Medicare Other | Admitting: Internal Medicine

## 2014-11-24 ENCOUNTER — Other Ambulatory Visit: Payer: Self-pay | Admitting: Geriatric Medicine

## 2014-11-24 ENCOUNTER — Ambulatory Visit: Payer: Medicare Other | Admitting: Physical Therapy

## 2014-11-24 VITALS — BP 118/62 | HR 93 | Temp 97.7°F | Resp 20 | Ht 72.0 in | Wt 224.0 lb

## 2014-11-24 DIAGNOSIS — J90 Pleural effusion, not elsewhere classified: Secondary | ICD-10-CM

## 2014-11-24 MED ORDER — GABAPENTIN 100 MG PO CAPS: 200.0000 mg | ORAL_CAPSULE | Freq: Every evening | ORAL | Status: DC

## 2014-11-24 NOTE — Progress Notes (Signed)
Pre visit review using our clinic review tool, if applicable. No additional management support is needed unless otherwise documented below in the visit note. 

## 2014-11-24 NOTE — Patient Instructions (Signed)
Limit your sodium (Salt) intake  Call or return to clinic prn if these symptoms worsen or fail to improve as anticipated.   

## 2014-11-24 NOTE — Progress Notes (Signed)
Subjective:    Patient ID: [name] [name], male    DOB: 1931/09/07, 80 y.o.   MRN: [id number]  HPI  Admit date: 11/12/2014 Discharge date: 11/15/2014  Recommendations for Outpatient Follow-up:  1. PLEASE FOLLOW UP ON PLEURAL FLUID PATHOLOGY. I spoke with is Pulmonologist Dr Annamaria Boots prior to discharge.  2. Follow up on BMP and CBC in 1 week 3. Follow up on PT/INR on Monday 11/17/2014, Coumadin was reversed so that he could undergo thoracentesis. On day of discharge he had an INR of 1.48, give his significant risk of thromboembolism in setting of Afib, he was discharged on Lovenox for 5 days.  4. Home Health Services set up for PT  Discharge Diagnoses:  Principal Problem:  Pleural effusion Active Problems:  Shortness of breath  Hypothyroidism  Atrial fibrillation  Chronic right-sided HF (heart failure)  COPD with emphysema, mild  CKD (chronic kidney disease) stage 3, GFR 30-59 ml/min  Pleural effusion on right    80 year old patient who is seen today in follow-up following a recent hospital discharge.  He was admitted with shortness of breath and did have a diagnostic thoracentesis on the right.  He continues to have intermittent shortness of breath, but in general is improved.  Follow-up chest x-ray revealed only a small right pleural effusion.  He is scheduled for pulmonary follow-up on January 28.  Past Medical History  Diagnosis Date  . Family history of malignant neoplasm of gastrointestinal tract   . Unspecified constipation   . Abnormal involuntary movements(781.0)   . Vitamin B12 deficiency   . Claudication   . Hypertrophy of prostate with urinary obstruction and other lower urinary tract symptoms (LUTS)   . Obesity   . Mixed hyperlipidemia   . Atrial fibrillation     --myoview 07/2006: not gated. No ischemia or infarct  Diastolic HF-- Echo 04/8849 ER60% mild AI/MR. RV mild to moderately dilated/ dysfunction. ? restrictive CM . RVSP 36  . Bladder polyps   .  Kidney stones   . Unspecified sleep apnea     NPSG 05/14/2007- AHI 80.7/ hr  . OSA (obstructive sleep apnea)     "tried mask; couldn't use it; so I quit" (11/12/2014)  . Hypothyroidism   . Type II or unspecified type diabetes mellitus with neurological manifestations, not stated as uncontrolled   . History of hiatal hernia   . Daily headache     "I've had headaches all my life; get them almost daily" (11/12/2014)  . Stroke ~ 1985    "simple stroke" denies residual on 11/12/2014  . Arthritis     "just about qwhere"   . Pleural effusion 11/12/2014 admission      Review of Systems  Constitutional: Negative for fever, chills, appetite change and fatigue.  HENT: Negative for congestion, dental problem, ear pain, hearing loss, sore throat, tinnitus, trouble swallowing and voice change.   Eyes: Negative for pain, discharge and visual disturbance.  Respiratory: Positive for shortness of breath. Negative for cough, chest tightness, wheezing and stridor.   Cardiovascular: Negative for chest pain, palpitations and leg swelling.  Gastrointestinal: Negative for nausea, vomiting, abdominal pain, diarrhea, constipation, blood in stool and abdominal distention.  Genitourinary: Negative for urgency, hematuria, flank pain, discharge, difficulty urinating and genital sores.  Musculoskeletal: Positive for gait problem. Negative for myalgias, back pain, joint swelling, arthralgias and neck stiffness.  Skin: Negative for rash.  Neurological: Negative for dizziness, syncope, speech difficulty, weakness, numbness and headaches.  Hematological: Negative for adenopathy. Does not  bruise/bleed easily.  Psychiatric/Behavioral: Negative for behavioral problems and dysphoric mood. The patient is not nervous/anxious.        Objective:   Physical Exam  Constitutional: He is oriented to person, place, and time. He appears well-developed.  HENT:  Head: Normocephalic.  Right Ear: External ear normal.  Left Ear:  External ear normal.  Eyes: Conjunctivae and EOM are normal.  Neck: Normal range of motion.  Cardiovascular: Normal rate and normal heart sounds.   Pulmonary/Chest: Effort normal. No respiratory distress. He has no rales.  Decreased breath sounds right lower hemithorax  Abdominal: Bowel sounds are normal.  Musculoskeletal: Normal range of motion. He exhibits no edema or tenderness.  Neurological: He is alert and oriented to person, place, and time.  Psychiatric: He has a normal mood and affect. His behavior is normal.          Assessment & Plan:   Atrial fibrillation.  Continue Coumadin anticoagulation  Chronic kidney disease.  Will follow-up lab  Parkinson's disease  Right pleural effusion.  Will repeat a chest x-ray.  Pulmonary follow-up as scheduled.  Pleural effusion probably has increased based on clinical exam compared to past chest x-ray

## 2014-11-26 ENCOUNTER — Telehealth: Payer: Self-pay | Admitting: Internal Medicine

## 2014-11-26 ENCOUNTER — Ambulatory Visit: Payer: Medicare Other | Admitting: Physical Therapy

## 2014-11-26 NOTE — Telephone Encounter (Signed)
lmtcb X 2 

## 2014-11-26 NOTE — Telephone Encounter (Signed)
lmtcb X1 for Almyra Free at [facility].

## 2014-11-27 ENCOUNTER — Other Ambulatory Visit: Payer: Self-pay | Admitting: *Deleted

## 2014-11-27 MED ORDER — LEVOTHYROXINE SODIUM 100 MCG PO TABS: 100.0000 ug | ORAL_TABLET | Freq: Every day | ORAL | Status: DC

## 2014-11-27 NOTE — Telephone Encounter (Signed)
Called and spoke with julie from [facility].  She stated that she was transferred yesterday to the coumadin clinic and she did leave a message for them.  I attempted to call the [facility] this morning with no answer.  Will try back later.

## 2014-11-27 NOTE — Telephone Encounter (Signed)
Called and spoke with Almyra Free from [facility] and she is aware of CY recs for the coumadin dosing.  i have called the pt and he is aware of how to take the coumadin.  Almyra Free will call the coumadin clinic on Monday to get the pt in for follow up.  Nothing further is needed.

## 2014-11-27 NOTE — Telephone Encounter (Signed)
[name] [name] looks like the pt is followed by the coumadin clinic.  I have left 2 messages today for them and I think they are closed today.  [facility] checked the pts Protime at home and called with results.  [facility] did  Call yesterday and lmom for the CC.  Please advise how we will need to proceed with this today for the pts dosing.  Thanks  Allergies  Allergen Reactions  . Penicillins Rash    Happened 60 years ago    Current Outpatient Prescriptions on File Prior to Visit  Medication Sig Dispense Refill  . carbidopa-levodopa (SINEMET IR) 25-100 MG per tablet Take 1 tablet by mouth 2 (two) times daily. 180 tablet 3  . diazepam (VALIUM) 2 MG tablet Take 1 tablet (2 mg total) by mouth 2 (two) times daily as needed for anxiety. (Patient taking differently: Take 2 mg by mouth every evening. ) 60 tablet 5  . digoxin (LANOXIN) 0.25 MG tablet Take 0.25 mg by mouth daily.    . furosemide (LASIX) 20 MG tablet 1 tablet Monday, Wednesday, Friday only 90 tablet 3  . gabapentin (NEURONTIN) 100 MG capsule Take 2 capsules (200 mg total) by mouth every evening. 1 at bedtime. 60 capsule 3  . glucose blood test strip 1 each by Other route daily as needed for other. 100 each 12  . insulin glargine (LANTUS) 100 UNIT/ML injection Inject 0.5 mLs (50 Units total) into the skin at bedtime. 10 mL 11  . Insulin Pen Needle (PEN NEEDLES 31GX5/16") 31G X 8 MM MISC 1 each by Does not apply route as directed. 100 each 1  . Lancets (ONETOUCH ULTRASOFT) lancets 1 each by Other route daily as needed for other. 100 each 12  . levothyroxine (SYNTHROID, LEVOTHROID) 100 MCG tablet TAKE 1 TABLET BY MOUTH DAILY (Patient taking differently: TAKE 1 TABLET BY MOUTH every evening) 90 tablet 3  . lovastatin (MEVACOR) 20 MG tablet TAKE 1 TABLET BY MOUTH DAILY (Patient taking differently: Take 20 mg by mouth every morning. TAKE 1 TABLET BY MOUTH DAILY) 30 tablet 5  . metoprolol (TOPROL-XL) 100 MG 24 hr tablet 1 half tablet daily (Patient taking differently:  Take 50 mg by mouth every morning. 1 half tablet daily) 90 tablet 3  . polyethylene glycol (MIRALAX) powder Take 17 g by mouth at bedtime. For regularity     . sertraline (ZOLOFT) 25 MG tablet Take 1 tablet (25 mg total) by mouth daily. (Patient taking differently: Take 25 mg by mouth every morning. ) 30 tablet 11  . Tamsulosin HCl (FLOMAX) 0.4 MG CAPS Take 0.4 mg by mouth daily after supper.     . Thiamine HCl (VITAMIN B-1 PO) Take 1 capsule by mouth 2 (two) times daily.    . trospium (SANCTURA) 20 MG tablet Take 20 mg by mouth 2 (two) times daily.    [name] warfarin (COUMADIN) 5 MG tablet Take as directed by anticoagulation clinic (Patient taking differently: Take 2.5-5 mg by mouth every evening. Take half of a tablet (2.5mg ) on Sundays, Tuesdays, and Fridays.  On all other days, take a whole tablet (5mg ).) 105 tablet 1   No current facility-administered medications on file prior to visit.

## 2014-11-27 NOTE — Telephone Encounter (Signed)
Continue current dosing- 5 mg tab daily, except on Sundays. Tuesdays and Fridays take 1/2 tab, = 2.5 mg. Follow with coumadin clinic when they open again.

## 2014-11-27 NOTE — Telephone Encounter (Signed)
lmtcb x3 

## 2014-11-29 ENCOUNTER — Observation Stay (HOSPITAL_COMMUNITY): Payer: Medicare Other

## 2014-11-29 ENCOUNTER — Emergency Department (HOSPITAL_COMMUNITY): Payer: Medicare Other

## 2014-11-29 ENCOUNTER — Observation Stay (HOSPITAL_COMMUNITY)
Admission: EM | Admit: 2014-11-29 | Discharge: 2014-11-30 | Disposition: A | Discharge status: Home / Self Care | Payer: Medicare Other | Attending: Internal Medicine | Admitting: Internal Medicine

## 2014-11-29 ENCOUNTER — Encounter (HOSPITAL_COMMUNITY): Payer: Self-pay | Admitting: Emergency Medicine

## 2014-11-29 DIAGNOSIS — N400 Enlarged prostate without lower urinary tract symptoms: Secondary | ICD-10-CM | POA: Diagnosis not present

## 2014-11-29 DIAGNOSIS — J439 Emphysema, unspecified: Secondary | ICD-10-CM | POA: Diagnosis present

## 2014-11-29 DIAGNOSIS — E785 Hyperlipidemia, unspecified: Secondary | ICD-10-CM | POA: Insufficient documentation

## 2014-11-29 DIAGNOSIS — G20A1 Parkinson's disease without dyskinesia, without mention of fluctuations: Secondary | ICD-10-CM | POA: Diagnosis present

## 2014-11-29 DIAGNOSIS — Z9889 Other specified postprocedural states: Secondary | ICD-10-CM

## 2014-11-29 DIAGNOSIS — N184 Chronic kidney disease, stage 4 (severe): Secondary | ICD-10-CM | POA: Diagnosis present

## 2014-11-29 DIAGNOSIS — E039 Hypothyroidism, unspecified: Secondary | ICD-10-CM | POA: Diagnosis not present

## 2014-11-29 DIAGNOSIS — J90 Pleural effusion, not elsewhere classified: Principal | ICD-10-CM | POA: Diagnosis present

## 2014-11-29 DIAGNOSIS — E538 Deficiency of other specified B group vitamins: Secondary | ICD-10-CM | POA: Diagnosis not present

## 2014-11-29 DIAGNOSIS — E782 Mixed hyperlipidemia: Secondary | ICD-10-CM | POA: Diagnosis not present

## 2014-11-29 DIAGNOSIS — Z880 Allergy status to penicillin: Secondary | ICD-10-CM | POA: Diagnosis not present

## 2014-11-29 DIAGNOSIS — I4891 Unspecified atrial fibrillation: Secondary | ICD-10-CM | POA: Diagnosis not present

## 2014-11-29 DIAGNOSIS — G20 Parkinson's disease: Secondary | ICD-10-CM | POA: Diagnosis present

## 2014-11-29 DIAGNOSIS — Z7901 Long term (current) use of anticoagulants: Secondary | ICD-10-CM | POA: Insufficient documentation

## 2014-11-29 DIAGNOSIS — R0602 Shortness of breath: Secondary | ICD-10-CM | POA: Diagnosis present

## 2014-11-29 DIAGNOSIS — Z8673 Personal history of transient ischemic attack (TIA), and cerebral infarction without residual deficits: Secondary | ICD-10-CM | POA: Diagnosis not present

## 2014-11-29 DIAGNOSIS — Z794 Long term (current) use of insulin: Secondary | ICD-10-CM | POA: Insufficient documentation

## 2014-11-29 DIAGNOSIS — I452 Bifascicular block: Secondary | ICD-10-CM | POA: Insufficient documentation

## 2014-11-29 DIAGNOSIS — Z87442 Personal history of urinary calculi: Secondary | ICD-10-CM | POA: Insufficient documentation

## 2014-11-29 DIAGNOSIS — Z87891 Personal history of nicotine dependence: Secondary | ICD-10-CM | POA: Diagnosis not present

## 2014-11-29 DIAGNOSIS — R0600 Dyspnea, unspecified: Secondary | ICD-10-CM

## 2014-11-29 DIAGNOSIS — G4733 Obstructive sleep apnea (adult) (pediatric): Secondary | ICD-10-CM | POA: Diagnosis not present

## 2014-11-29 LAB — BASIC METABOLIC PANEL
Anion gap: 9 (ref 5–15)
BUN: 84 mg/dL — AB (ref 6–23)
CALCIUM: 9.8 mg/dL (ref 8.4–10.5)
CO2: 27 mmol/L (ref 19–32)
CREATININE: 2.52 mg/dL — AB (ref 0.50–1.35)
Chloride: 106 mEq/L (ref 96–112)
GFR calc Af Amer: 26 mL/min — ABNORMAL LOW (ref >90)
GFR, EST NON AFRICAN AMERICAN: 22 mL/min — AB (ref >90)
GLUCOSE: 126 mg/dL — AB (ref 70–99)
Potassium: 4.7 mmol/L (ref 3.5–5.1)
SODIUM: 142 mmol/L (ref 135–145)

## 2014-11-29 LAB — CBC WITH DIFFERENTIAL/PLATELET
BASOS PCT: 1 % (ref 0–1)
Basophils Absolute: 0 10*3/uL (ref 0.0–0.1)
EOS ABS: 0.3 10*3/uL (ref 0.0–0.7)
EOS PCT: 4 % (ref 0–5)
HCT: 33.3 % — ABNORMAL LOW (ref 39.0–52.0)
HEMOGLOBIN: 10.5 g/dL — AB (ref 13.0–17.0)
Lymphocytes Relative: 23 % (ref 12–46)
Lymphs Abs: 1.5 10*3/uL (ref 0.7–4.0)
MCH: 29.3 pg (ref 26.0–34.0)
MCHC: 31.5 g/dL (ref 30.0–36.0)
MCV: 93 fL (ref 78.0–100.0)
MONOS PCT: 8 % (ref 3–12)
Monocytes Absolute: 0.5 10*3/uL (ref 0.1–1.0)
Neutro Abs: 4.1 10*3/uL (ref 1.7–7.7)
Neutrophils Relative %: 64 % (ref 43–77)
Platelets: 192 10*3/uL (ref 150–400)
RBC: 3.58 MIL/uL — ABNORMAL LOW (ref 4.22–5.81)
RDW: 15.1 % (ref 11.5–15.5)
WBC: 6.4 10*3/uL (ref 4.0–10.5)

## 2014-11-29 LAB — BODY FLUID CELL COUNT WITH DIFFERENTIAL
Eos, Fluid: 7 %
Lymphs, Fluid: 39 %
Monocyte-Macrophage-Serous Fluid: 47 % — ABNORMAL LOW (ref 50–90)
NEUTROPHIL FLUID: 7 % (ref 0–25)
WBC FLUID: 1349 uL — AB (ref 0–1000)

## 2014-11-29 LAB — PROTEIN, BODY FLUID: Total protein, fluid: 5.2 g/dL

## 2014-11-29 LAB — PROTIME-INR
INR: 1.97 — ABNORMAL HIGH (ref 0.00–1.49)
Prothrombin Time: 22.6 seconds — ABNORMAL HIGH (ref 11.6–15.2)

## 2014-11-29 LAB — TROPONIN I

## 2014-11-29 LAB — BRAIN NATRIURETIC PEPTIDE: B Natriuretic Peptide: 48.4 pg/mL (ref 0.0–100.0)

## 2014-11-29 LAB — LACTATE DEHYDROGENASE: LDH: 167 U/L (ref 94–250)

## 2014-11-29 LAB — PH, BODY FLUID: pH, Fluid: 8

## 2014-11-29 LAB — LACTATE DEHYDROGENASE, PLEURAL OR PERITONEAL FLUID: LD, Fluid: 171 U/L — ABNORMAL HIGH (ref 3–23)

## 2014-11-29 LAB — GLUCOSE, CAPILLARY: GLUCOSE-CAPILLARY: 132 mg/dL — AB (ref 70–99)

## 2014-11-29 LAB — DIGOXIN LEVEL

## 2014-11-29 MED ORDER — SODIUM CHLORIDE 0.9 % IV SOLN
INTRAVENOUS | Status: DC
  Administered 2014-11-29: 10 mL/h via INTRAVENOUS

## 2014-11-29 MED ORDER — LIDOCAINE HCL (PF) 1 % IJ SOLN
INTRAMUSCULAR | Status: AC
  Filled 2014-11-29: qty 10

## 2014-11-29 MED ORDER — ACETAMINOPHEN 325 MG PO TABS: 650.0000 mg | ORAL_TABLET | Freq: Four times a day (QID) | ORAL | Status: DC | PRN

## 2014-11-29 MED ORDER — FUROSEMIDE 20 MG PO TABS: 20.0000 mg | ORAL_TABLET | ORAL | Status: DC

## 2014-11-29 MED ORDER — ALBUTEROL SULFATE (2.5 MG/3ML) 0.083% IN NEBU: 2.5000 mg | INHALATION_SOLUTION | RESPIRATORY_TRACT | Status: DC | PRN

## 2014-11-29 MED ORDER — POLYETHYLENE GLYCOL 3350 17 G PO PACK: 17.0000 g | PACK | Freq: Every day | ORAL | Status: DC | PRN

## 2014-11-29 MED ORDER — SENNA 8.6 MG PO TABS
1.0000 | ORAL_TABLET | Freq: Two times a day (BID) | ORAL | Status: DC
  Administered 2014-11-29 – 2014-11-30 (×2): 8.6 mg via ORAL
  Filled 2014-11-29 (×3): qty 1

## 2014-11-29 MED ORDER — BISACODYL 10 MG RE SUPP: 10.0000 mg | Freq: Every day | RECTAL | Status: DC | PRN

## 2014-11-29 MED ORDER — METOPROLOL SUCCINATE ER 50 MG PO TB24: 50.0000 mg | ORAL_TABLET | Freq: Every morning | ORAL | Status: DC

## 2014-11-29 MED ORDER — VITAMIN B-12 100 MCG PO TABS
100.0000 ug | ORAL_TABLET | Freq: Every day | ORAL | Status: DC
  Administered 2014-11-29 – 2014-11-30 (×2): 100 ug via ORAL
  Filled 2014-11-29 (×2): qty 1

## 2014-11-29 MED ORDER — LEVOTHYROXINE SODIUM 100 MCG PO TABS
100.0000 ug | ORAL_TABLET | Freq: Every day | ORAL | Status: DC
  Administered 2014-11-30: 100 ug via ORAL
  Filled 2014-11-29 (×2): qty 1

## 2014-11-29 MED ORDER — ACETAMINOPHEN 650 MG RE SUPP: 650.0000 mg | Freq: Four times a day (QID) | RECTAL | Status: DC | PRN

## 2014-11-29 MED ORDER — WARFARIN SODIUM 5 MG PO TABS
5.0000 mg | ORAL_TABLET | Freq: Once | ORAL | Status: AC
  Administered 2014-11-29: 5 mg via ORAL
  Filled 2014-11-29: qty 1

## 2014-11-29 MED ORDER — TAMSULOSIN HCL 0.4 MG PO CAPS
0.4000 mg | ORAL_CAPSULE | Freq: Every day | ORAL | Status: DC
  Administered 2014-11-29 – 2014-11-30 (×2): 0.4 mg via ORAL
  Filled 2014-11-29 (×2): qty 1

## 2014-11-29 MED ORDER — POLYETHYLENE GLYCOL 3350 17 GM/SCOOP PO POWD
17.0000 g | Freq: Every day | ORAL | Status: DC
  Filled 2014-11-29: qty 255

## 2014-11-29 MED ORDER — POLYETHYLENE GLYCOL 3350 17 G PO PACK
17.0000 g | PACK | Freq: Every day | ORAL | Status: DC
  Administered 2014-11-29: 17 g via ORAL
  Filled 2014-11-29 (×2): qty 1

## 2014-11-29 MED ORDER — INSULIN GLARGINE 100 UNIT/ML ~~LOC~~ SOLN
40.0000 [IU] | Freq: Every day | SUBCUTANEOUS | Status: DC
  Administered 2014-11-29: 40 [IU] via SUBCUTANEOUS
  Filled 2014-11-29 (×2): qty 0.4

## 2014-11-29 MED ORDER — CARBIDOPA-LEVODOPA 25-100 MG PO TABS
1.0000 | ORAL_TABLET | Freq: Two times a day (BID) | ORAL | Status: DC
Start: 2014-11-29 — End: 2014-11-30
  Administered 2014-11-29 – 2014-11-30 (×2): 1 via ORAL
  Filled 2014-11-29 (×3): qty 1

## 2014-11-29 MED ORDER — WARFARIN - PHARMACIST DOSING INPATIENT
Freq: Every day | Status: DC
  Administered 2014-11-29: 18:00:00

## 2014-11-29 MED ORDER — DOCUSATE SODIUM 100 MG PO CAPS
100.0000 mg | ORAL_CAPSULE | Freq: Two times a day (BID) | ORAL | Status: DC
  Administered 2014-11-29 – 2014-11-30 (×2): 100 mg via ORAL
  Filled 2014-11-29 (×2): qty 1

## 2014-11-29 MED ORDER — DARIFENACIN HYDROBROMIDE ER 7.5 MG PO TB24
7.5000 mg | ORAL_TABLET | Freq: Every day | ORAL | Status: DC
  Administered 2014-11-30: 7.5 mg via ORAL
  Filled 2014-11-29: qty 1

## 2014-11-29 MED ORDER — METOPROLOL SUCCINATE ER 50 MG PO TB24
50.0000 mg | ORAL_TABLET | Freq: Every day | ORAL | Status: DC
  Administered 2014-11-30: 50 mg via ORAL
  Filled 2014-11-29: qty 1

## 2014-11-29 MED ORDER — DIAZEPAM 2 MG PO TABS: 2.0000 mg | ORAL_TABLET | Freq: Two times a day (BID) | ORAL | Status: DC | PRN

## 2014-11-29 MED ORDER — SERTRALINE HCL 25 MG PO TABS
25.0000 mg | ORAL_TABLET | Freq: Every day | ORAL | Status: DC
  Administered 2014-11-30: 25 mg via ORAL
  Filled 2014-11-29: qty 1

## 2014-11-29 MED ORDER — PRAVASTATIN SODIUM 20 MG PO TABS
20.0000 mg | ORAL_TABLET | Freq: Every day | ORAL | Status: DC
  Administered 2014-11-29: 20 mg via ORAL
  Filled 2014-11-29 (×2): qty 1

## 2014-11-29 MED ORDER — GABAPENTIN 100 MG PO CAPS
200.0000 mg | ORAL_CAPSULE | Freq: Every evening | ORAL | Status: DC
  Administered 2014-11-29: 200 mg via ORAL
  Filled 2014-11-29 (×2): qty 2

## 2014-11-29 NOTE — H&P (Signed)
Patient Demographics  [name] [name], is a 80 y.o. male  MRN: [id number]   DOB - 08-Jun-1931  Admit Date - 11/29/2014  Outpatient Primary MD for the patient is Nyoka Cowden, MD   With History of -  Past Medical History  Diagnosis Date  . Family history of malignant neoplasm of gastrointestinal tract   . Unspecified constipation   . Abnormal involuntary movements(781.0)   . Vitamin B12 deficiency   . Claudication   . Hypertrophy of prostate with urinary obstruction and other lower urinary tract symptoms (LUTS)   . Obesity   . Mixed hyperlipidemia   . Atrial fibrillation     --myoview 07/2006: not gated. No ischemia or infarct  Diastolic HF-- Echo 12/3242 ER60% mild AI/MR. RV mild to moderately dilated/ dysfunction. ? restrictive CM . RVSP 36  . Bladder polyps   . Kidney stones   . Unspecified sleep apnea     NPSG 05/14/2007- A[facility] 80.7/ hr  . OSA (obstructive sleep apnea)     "tried mask; couldn't use it; so I quit" (11/12/2014)  . Hypothyroidism   . Type II or unspecified type diabetes mellitus with neurological manifestations, not stated as uncontrolled   . History of hiatal hernia   . Daily headache     "I've had headaches all my life; get them almost daily" (11/12/2014)  . Stroke ~ 1985    "simple stroke" denies residual on 11/12/2014  . Arthritis     "just about qwhere"   . Pleural effusion 11/12/2014 admission      Past Surgical History  Procedure Laterality Date  . Appendectomy    . Kidney stone surgery      Kidney stone retrieval with uretal stent x 2   . Bladder polyps    . Transurethral resection of bladder    . Orif elbow fracture    . Fracture surgery    . Excisional hemorrhoidectomy  1950's  . Ureteral stent placement  X 1  . Cystoscopy w/ stone manipulation  X 2  . Cataract extraction w/ intraocular lens  implant, bilateral Bilateral     in for   Chief Complaint  Patient presents with  . Shortness of Breath     HPI  [name] [name]  is a 80  y.o. male, with past medical history of atrial fibrillation on warfarin, diabetes mellitus, Parkinson disease, dyslipidemia, presents today with complaints of shortness of breath, vision to reports it is more exertional, patient was recently discharged from [facility] with diagnosis of right pleural effusion, status post thoracentesis on 1 L, appears to be exudate, cytology showing reactive mesothelial cells, patient is x-ray showing recurrence of his right pleural effusion, so hospitalist requested to admit the patient, he denies any chest pain, cough, fever, chills, hemoptysis.    Review of Systems    In addition to the HPI above, No Fever-chills, No Headache, No changes with Vision or hearing, No problems swallowing food or Liquids, No Chest pain, Cough reports Shortness of Breath, No Abdominal pain, No Nausea or Vommitting, Bowel movements are regular, No Blood in stool or Urine, No dysuria, No new skin rashes or bruises, No new joints pains-aches,  No new weakness, tingling, numbness in any extremity, No recent weight gain or loss, No polyuria, polydypsia or polyphagia, No significant Mental Stressors.  A full 10 point Review of Systems was done, except as stated above, all other Review of Systems were negative.   Social History History  Substance Use  Topics  . Smoking status: Former Smoker -- 1.00 packs/day for 35 years    Types: Cigarettes    Quit date: 12/05/1985  . Smokeless tobacco: Never Used  . Alcohol Use: 0.0 oz/week    0 Not specified per week     Comment: 11/12/2014 "used to drink beer q now and then; not anymore"     Family History Family History  Problem Relation Age of Onset  . Lung cancer Mother 38    nonsmoker  . Colon cancer Sister   . Prostate cancer Paternal Uncle   . Stroke Father   . Hip fracture Father   . Healthy Son   . Healthy Daughter     Prior to Admission medications   Medication Sig Start Date End Date Taking? Authorizing  Provider  carbidopa-levodopa (SINEMET IR) 25-100 MG per tablet Take 1 tablet by mouth 2 (two) times daily. 07/07/14  Yes Donika K Patel, DO  Cyanocobalamin (VITAMIN B 12 PO) Take 1 tablet by mouth daily.   Yes Historical Provider, MD  diazepam (VALIUM) 2 MG tablet Take 1 tablet (2 mg total) by mouth 2 (two) times daily as needed for anxiety. Patient taking differently: Take 2 mg by mouth every evening.  10/08/14  Yes Marletta Lor, MD  furosemide (LASIX) 20 MG tablet 1 tablet Monday, Wednesday, Friday only Patient taking differently: Take 20 mg by mouth every Monday, Wednesday, and Friday. 1 tablet Monday, Wednesday, Friday only 06/24/14  Yes Marletta Lor, MD  gabapentin (NEURONTIN) 100 MG capsule Take 2 capsules (200 mg total) by mouth every evening. 1 at bedtime. Patient taking differently: Take 200 mg by mouth every evening.  11/24/14  Yes Rowe Clack, MD  insulin glargine (LANTUS) 100 UNIT/ML injection Inject 0.5 mLs (50 Units total) into the skin at bedtime. 11/15/14  Yes Kelvin Cellar, MD  levothyroxine (SYNTHROID, LEVOTHROID) 100 MCG tablet Take 1 tablet (100 mcg total) by mouth daily. 11/27/14  Yes Laurey Morale, MD  lovastatin (MEVACOR) 20 MG tablet TAKE 1 TABLET BY MOUTH DAILY Patient taking differently: Take 20 mg by mouth every morning. TAKE 1 TABLET BY MOUTH DAILY 07/24/14  Yes Marletta Lor, MD  metoprolol (TOPROL-XL) 100 MG 24 hr tablet 1 half tablet daily Patient taking differently: Take 50 mg by mouth every morning. 1 half tablet daily 06/24/14  Yes Marletta Lor, MD  polyethylene glycol [facility]) powder Take 17 g by mouth at bedtime. For regularity   Yes Historical Provider, MD  sertraline (ZOLOFT) 25 MG tablet Take 1 tablet (25 mg total) by mouth daily. Patient taking differently: Take 25 mg by mouth every morning.  01/07/14  Yes Neena Rhymes, MD  Tamsulosin HCl (FLOMAX) 0.4 MG CAPS Take 0.4 mg by mouth daily.    Yes Historical Provider, MD    trospium (SANCTURA) 20 MG tablet Take 20 mg by mouth 2 (two) times daily.   Yes Historical Provider, MD  warfarin (COUMADIN) 5 MG tablet Take as directed by anticoagulation clinic Patient taking differently: Take 2.5-5 mg by mouth every evening. Take half of a tablet (2.5mg ) on Sundays, Tuesdays, and Fridays.  On all other days, take a whole tablet (5mg ). 09/29/14  Yes Timoteo Gaul, FNP  glucose blood test strip 1 each by Other route daily as needed for other. 07/10/14   Marletta Lor, MD  Insulin Pen Needle (PEN NEEDLES 31GX5/16") 31G X 8 MM MISC 1 each by Does not apply route as directed. 07/09/12  Neena Rhymes, MD  Lancets [facility] ULTRASOFT) lancets 1 each by Other route daily as needed for other. 07/10/14   Marletta Lor, MD    Allergies  Allergen Reactions  . Penicillins Rash    Happened 60 years ago    Physical Exam  Vitals  Blood pressure 105/70, pulse 91, temperature 97.9 F (36.6 C), temperature source Oral, resp. rate 23, SpO2 100 %.   1. General frail elderly male lying in bed in NAD,    2. Normal affect and insight, Not Suicidal or Homicidal, Awake Alert, Oriented X 3.  3. No F.N deficits, ALL C.Nerves Intact, Strength 5/5 all 4 extremities, Sensation intact all 4 extremities, Plantars down going.  4. Ears and Eyes appear Normal, Conjunctivae clear, PERRLA. Moist Oral Mucosa.  5. Supple Neck, No JVD, No cervical lymphadenopathy appriciated, No Carotid Bruits.  6. Symmetrical Chest wall movement, decreased air entry in the right side.  7. Irregular irregular, No Gallops, Rubs or Murmurs, No Parasternal Heave.  8. Positive Bowel Sounds, Abdomen Soft, No tenderness, No organomegaly appriciated,No rebound -guarding or rigidity.  9.  No Cyanosis, Normal Skin Turgor, No Skin Rash or Bruise.  10. Good muscle tone,  joints appear normal , no effusions, Normal ROM.    Data Review  CBC  Recent Labs Lab 11/29/14 0820  WBC 6.4  HGB 10.5*  HCT  33.3*  PLT 192  MCV 93.0  MCH 29.3  MCHC 31.5  RDW 15.1  LYMPHSABS 1.5  MONOABS 0.5  EOSABS 0.3  BASOSABS 0.0   ------------------------------------------------------------------------------------------------------------------  Chemistries   Recent Labs Lab 11/29/14 0820  NA 142  K 4.7  CL 106  CO2 27  GLUCOSE 126*  BUN 84*  CREATININE 2.52*  CALCIUM 9.8   ------------------------------------------------------------------------------------------------------------------ estimated creatinine clearance is 28.4 mL/min (by C-G formula based on Cr of 2.52). ------------------------------------------------------------------------------------------------------------------ No results for input(s): TSH, T4TOTAL, T3FREE, THYROIDAB in the last 72 hours.  Invalid input(s): FREET3   Coagulation profile  Recent Labs Lab 11/29/14 0820  INR 1.97*   ------------------------------------------------------------------------------------------------------------------- No results for input(s): DDIMER in the last 72 hours. -------------------------------------------------------------------------------------------------------------------  Cardiac Enzymes  Recent Labs Lab 11/29/14 0820  TROPONINI <0.03   ------------------------------------------------------------------------------------------------------------------ Invalid input(s): POCBNP   ---------------------------------------------------------------------------------------------------------------  Urinalysis    Component Value Date/Time   COLORURINE YELLOW 05/11/2009 1151   APPEARANCEUR CLEAR 05/11/2009 1151   LABSPEC 1.022 05/11/2009 1151   PHURINE 6.5 05/11/2009 1151   GLUCOSEU NEGATIVE 05/11/2009 1151   GLUCOSEU 500 mg/dL* 12/14/2007 1204   HGBUR NEGATIVE 05/11/2009 1151   BILIRUBINUR NEGATIVE 05/11/2009 1151   KETONESUR NEGATIVE 05/11/2009 1151   PROTEINUR NEGATIVE 05/11/2009 1151   UROBILINOGEN 1.0 05/11/2009  1151   NITRITE NEGATIVE 05/11/2009 1151   LEUKOCYTESUR  05/11/2009 1151    NEGATIVE MICROSCOPIC NOT DONE ON URINES WITH NEGATIVE PROTEIN, BLOOD, LEUKOCYTES, NITRITE, OR GLUCOSE <1000 mg/dL.    ----------------------------------------------------------------------------------------------------------------  Imaging results:   Dg Chest 2 View  11/29/2014   CLINICAL DATA:  Acute shortness of breath, right pleural effusion  EXAM: CHEST - 2 VIEW  COMPARISON:  11/24/2014  FINDINGS: Slight enlargement of the right pleural effusion compared to 11/24/2014. Right middle and lower lobe compressive atelectasis/ consolidation noted. Minor left base atelectasis. Heart remains enlarged with central vascular congestion. No definite edema pattern or CHF. Negative for pneumothorax. Trachea midline. Atherosclerosis of the tortuous aorta. Degenerative changes of the spine.  IMPRESSION: Slight increase in the right pleural effusion with right middle and lower lobe collapse/consolidation.  Stable cardiomegaly  and vascular congestion   Electronically Signed   By: Daryll Brod M.D.   On: 11/29/2014 08:46    My personal review of EKG: Rhythm is atrial fibrillation, Rate  95/min,    Assessment & Plan  Active Problems:   B12 deficiency   Atrial fibrillation   BPH (benign prostatic hyperplasia)   Parkinson's disease   COPD with emphysema, mild   CKD (chronic kidney disease) stage 3, GFR 30-59 ml/min   Shortness of breath   Recurrent right pleural effusion    Dyspnea/recurrent right pleural effusion: - Patient presents with recurrence of pleural effusion, atelectasis and pleases done less than 2 weeks ago, with recurrence of the fluid, workup showing exudate by light's criteria, cytology showing reactive mesothelial cells, requested thoracentesis to be done ultrasound-guided, will repeat workup including protein level, LDH, cytology, cell count, culture. - We'll check CT chest without contrast 24-48 hours  after thoracentesis(previous one done on 12/9, but done prior to thoracentesis).  Atrial fibrillation -Rate controlled, consult pharmacy to dose warfarin, continue with Toprol-XL.  COPD -No active wheezing, continue with nebs as needed  Hypothyroidism -Continue with Synthroid  BPH -Continue with Flomax  B 12 deficiency  -Continue with vitamin B-12  CKD - Appears to be at baseline continue to monitor, avoid nephrotoxic medications   DVT Prophylaxis on warfarin  AM Labs Ordered, also please review Full Orders  Family Communication: Admission, patients condition and plan of care including tests being ordered have been discussed with the patient who indicate understanding and agree with the plan and Code Status.  Code Status DO NOT RESUSCITATE  Likely DC to  home when stable  Condition GUARDED    Time spent in minutes : 60 minutes    Martena Emanuele M.D on 11/29/2014 at 12:21 PM  Between 7am to 7pm - Pager - [phone]  After 7pm go to [email or web address] - password [email or web address]  And look for the night coverage person covering me after hours  [facility] Office  [phone]   **Disclaimer: This note may have been dictated with voice recognition software. Similar sounding words can inadvertently be transcribed and this note may contain transcription errors which may not have been corrected upon publication of note.**

## 2014-11-29 NOTE — ED Notes (Signed)
Patient is resting comfortably. 

## 2014-11-29 NOTE — ED Notes (Addendum)
Pt states he is feeling weak; had 1L of fluid removed from right lung 3 weeks ago. No chest tube placed. Increasing SOB. Lives at home with wife. Has just started feeling need to use oxygen at home.

## 2014-11-29 NOTE — ED Provider Notes (Signed)
CSN: [id number]     Arrival date & time 11/29/14  0757 History   First MD Initiated Contact with Patient 11/29/14 0800     Chief Complaint  Patient presents with  . Shortness of Breath     (Consider location/radiation/quality/duration/timing/severity/associated sxs/prior Treatment) HPI Comments: Patient here complaining of worsening shortness of breath 3 days. Has had a cough without fever or chills. No anginal type chest pain. History of a right-sided pleural effusion that was drained 3 weeks ago and symptoms feel similar to when he had to have the fluid drained before. No lower extremity weakness. No swelling to his lower legs. Symptoms persistent and worse with ambulation. Patient has been using home oxygen that was not prescribed by his doctor which has improved his symptoms.  Patient is a 80 y.o. male presenting with shortness of breath. The history is provided by the patient.  Shortness of Breath   Past Medical History  Diagnosis Date  . Family history of malignant neoplasm of gastrointestinal tract   . Unspecified constipation   . Abnormal involuntary movements(781.0)   . Vitamin B12 deficiency   . Claudication   . Hypertrophy of prostate with urinary obstruction and other lower urinary tract symptoms (LUTS)   . Obesity   . Mixed hyperlipidemia   . Atrial fibrillation     --myoview 07/2006: not gated. No ischemia or infarct  Diastolic HF-- Echo 05/1606 ER60% mild AI/MR. RV mild to moderately dilated/ dysfunction. ? restrictive CM . RVSP 36  . Bladder polyps   . Kidney stones   . Unspecified sleep apnea     NPSG 05/14/2007- AHI 80.7/ hr  . OSA (obstructive sleep apnea)     "tried mask; couldn't use it; so I quit" (11/12/2014)  . Hypothyroidism   . Type II or unspecified type diabetes mellitus with neurological manifestations, not stated as uncontrolled   . History of hiatal hernia   . Daily headache     "I've had headaches all my life; get them almost daily" (11/12/2014)   . Stroke ~ 1985    "simple stroke" denies residual on 11/12/2014  . Arthritis     "just about qwhere"   . Pleural effusion 11/12/2014 admission   Past Surgical History  Procedure Laterality Date  . Appendectomy    . Kidney stone surgery      Kidney stone retrieval with uretal stent x 2   . Bladder polyps    . Transurethral resection of bladder    . Orif elbow fracture    . Fracture surgery    . Excisional hemorrhoidectomy  1950's  . Ureteral stent placement  X 1  . Cystoscopy w/ stone manipulation  X 2  . Cataract extraction w/ intraocular lens  implant, bilateral Bilateral    Family History  Problem Relation Age of Onset  . Lung cancer Mother 66    nonsmoker  . Colon cancer Sister   . Prostate cancer Paternal Uncle   . Stroke Father   . Hip fracture Father   . Healthy Son   . Healthy Daughter    History  Substance Use Topics  . Smoking status: Former Smoker -- 1.00 packs/day for 35 years    Types: Cigarettes    Quit date: 12/05/1985  . Smokeless tobacco: Never Used  . Alcohol Use: 0.0 oz/week    0 Not specified per week     Comment: 11/12/2014 "used to drink beer q now and then; not anymore"    Review of Systems  Respiratory: Positive for shortness of breath.   All other systems reviewed and are negative.     Allergies  Penicillins  Home Medications   Prior to Admission medications   Medication Sig Start Date End Date Taking? Authorizing Provider  carbidopa-levodopa (SINEMET IR) 25-100 MG per tablet Take 1 tablet by mouth 2 (two) times daily. 07/07/14   Donika Keith Rake, DO  diazepam (VALIUM) 2 MG tablet Take 1 tablet (2 mg total) by mouth 2 (two) times daily as needed for anxiety. Patient taking differently: Take 2 mg by mouth every evening.  10/08/14   Marletta Lor, MD  digoxin (LANOXIN) 0.25 MG tablet Take 0.25 mg by mouth daily.    Historical Provider, MD  furosemide (LASIX) 20 MG tablet 1 tablet Monday, Wednesday, Friday only 06/24/14   Marletta Lor, MD  gabapentin (NEURONTIN) 100 MG capsule Take 2 capsules (200 mg total) by mouth every evening. 1 at bedtime. 11/24/14   Rowe Clack, MD  glucose blood test strip 1 each by Other route daily as needed for other. 07/10/14   Marletta Lor, MD  insulin glargine (LANTUS) 100 UNIT/ML injection Inject 0.5 mLs (50 Units total) into the skin at bedtime. 11/15/14   Kelvin Cellar, MD  Insulin Pen Needle (PEN NEEDLES 31GX5/16") 31G X 8 MM MISC 1 each by Does not apply route as directed. 07/09/12   Neena Rhymes, MD  Lancets [facility] ULTRASOFT) lancets 1 each by Other route daily as needed for other. 07/10/14   Marletta Lor, MD  levothyroxine (SYNTHROID, LEVOTHROID) 100 MCG tablet Take 1 tablet (100 mcg total) by mouth daily. 11/27/14   Laurey Morale, MD  lovastatin (MEVACOR) 20 MG tablet TAKE 1 TABLET BY MOUTH DAILY Patient taking differently: Take 20 mg by mouth every morning. TAKE 1 TABLET BY MOUTH DAILY 07/24/14   Marletta Lor, MD  metoprolol (TOPROL-XL) 100 MG 24 hr tablet 1 half tablet daily Patient taking differently: Take 50 mg by mouth every morning. 1 half tablet daily 06/24/14   Marletta Lor, MD  polyethylene glycol [facility]) powder Take 17 g by mouth at bedtime. For regularity     Historical Provider, MD  sertraline (ZOLOFT) 25 MG tablet Take 1 tablet (25 mg total) by mouth daily. Patient taking differently: Take 25 mg by mouth every morning.  01/07/14   Neena Rhymes, MD  Tamsulosin HCl (FLOMAX) 0.4 MG CAPS Take 0.4 mg by mouth daily after supper.     Historical Provider, MD  Thiamine HCl (VITAMIN B-1 PO) Take 1 capsule by mouth 2 (two) times daily.    Historical Provider, MD  trospium (SANCTURA) 20 MG tablet Take 20 mg by mouth 2 (two) times daily.    Historical Provider, MD  warfarin (COUMADIN) 5 MG tablet Take as directed by anticoagulation clinic Patient taking differently: Take 2.5-5 mg by mouth every evening. Take half of a tablet (2.5mg ) on  Sundays, Tuesdays, and Fridays.  On all other days, take a whole tablet (5mg ). 09/29/14   Timoteo Gaul, FNP   BP 110/64 mmHg  Pulse 90  Temp(Src) 97.9 F (36.6 C) (Oral)  Resp 26  SpO2 98% Physical Exam  Constitutional: He is oriented to person, place, and time. He appears well-developed and well-nourished.  Non-toxic appearance. No distress.  HENT:  Head: Normocephalic and atraumatic.  Eyes: Conjunctivae, EOM and lids are normal. Pupils are equal, round, and reactive to light.  Neck: Normal range of motion. Neck supple.  No tracheal deviation present. No thyroid mass present.  Cardiovascular: Normal rate, regular rhythm and normal heart sounds.  Exam reveals no gallop.   No murmur heard. Pulmonary/Chest: Effort normal. No stridor. No respiratory distress. He has decreased breath sounds. He has no wheezes. He has rhonchi. He has no rales.  Abdominal: Soft. Normal appearance and bowel sounds are normal. He exhibits no distension. There is no tenderness. There is no rebound and no CVA tenderness.  Musculoskeletal: Normal range of motion. He exhibits no edema or tenderness.  Neurological: He is alert and oriented to person, place, and time. He has normal strength. No cranial nerve deficit or sensory deficit. GCS eye subscore is 4. GCS verbal subscore is 5. GCS motor subscore is 6.  Skin: Skin is warm and dry. No abrasion and no rash noted.  Psychiatric: He has a normal mood and affect. His speech is normal and behavior is normal.  Nursing note and vitals reviewed.   ED Course  Procedures (including critical care time) Labs Review Labs Reviewed  CBC WITH DIFFERENTIAL  BRAIN NATRIURETIC PEPTIDE  BASIC METABOLIC PANEL  TROPONIN I  PROTIME-INR  DIGOXIN LEVEL    Imaging Review No results found.   EKG Interpretation   Date/Time:  Saturday November 29 2014 08:06:09 EST Ventricular Rate:  95 PR Interval:    QRS Duration: 155 QT Interval:  406 QTC Calculation: 510 R Axis:    96 Text Interpretation:  Atrial fibrillation RBBB and LPFB No significant  change since last tracing Confirmed by Davielle Lingelbach  MD, Andreah Goheen ([id number]) on  11/29/2014 8:15:17 AM      MDM   Final diagnoses:  SOB (shortness of breath)   Patient to be admitted for drainage of his pleural effusion     Leota Jacobsen, MD 11/29/14 1136

## 2014-11-29 NOTE — Progress Notes (Signed)
ANTICOAGULATION CONSULT NOTE - Initial Consult  Pharmacy Consult for Coumadin Indication: atrial fibrillation  Allergies  Allergen Reactions  . Penicillins Rash    Happened 60 years ago    Patient Measurements: Height: 6' (182.9 cm) Weight: 220 lb (99.791 kg) IBW/kg (Calculated) : 77.6 Heparin Dosing Weight:   Vital Signs: Temp: 97.9 F (36.6 C) (12/26 1359) Temp Source: Oral (12/26 1359) BP: 115/70 mmHg (12/26 1359) Pulse Rate: 81 (12/26 1359)  Labs:  Recent Labs  11/29/14 0820  HGB 10.5*  HCT 33.3*  PLT 192  LABPROT 22.6*  INR 1.97*  CREATININE 2.52*  TROPONINI <0.03    Estimated Creatinine Clearance: 28.1 mL/min (by C-G formula based on Cr of 2.52).   Medical History: Past Medical History  Diagnosis Date  . Family history of malignant neoplasm of gastrointestinal tract   . Unspecified constipation   . Abnormal involuntary movements(781.0)   . Vitamin B12 deficiency   . Claudication   . Hypertrophy of prostate with urinary obstruction and other lower urinary tract symptoms (LUTS)   . Obesity   . Mixed hyperlipidemia   . Atrial fibrillation     --myoview 07/2006: not gated. No ischemia or infarct  Diastolic HF-- Echo 06/3709 ER60% mild AI/MR. RV mild to moderately dilated/ dysfunction. ? restrictive CM . RVSP 36  . Bladder polyps   . Kidney stones   . Unspecified sleep apnea     NPSG 05/14/2007- AHI 80.7/ hr  . OSA (obstructive sleep apnea)     "tried mask; couldn't use it; so I quit" (11/12/2014)  . Hypothyroidism   . Type II or unspecified type diabetes mellitus with neurological manifestations, not stated as uncontrolled   . History of hiatal hernia   . Daily headache     "I've had headaches all my life; get them almost daily" (11/12/2014)  . Stroke ~ 1985    "simple stroke" denies residual on 11/12/2014  . Arthritis     "just about qwhere"   . Pleural effusion 11/12/2014 admission    Medications:  Scheduled:  . carbidopa-levodopa  1 tablet  Oral BID  . [START ON 11/30/2014] darifenacin  7.5 mg Oral Daily  . docusate sodium  100 mg Oral BID  . [START ON 12/01/2014] furosemide  20 mg Oral Q M,W,F  . gabapentin  200 mg Oral QPM  . insulin glargine  40 Units Subcutaneous QHS  . [START ON 11/30/2014] levothyroxine  100 mcg Oral QAC breakfast  . lidocaine (PF)      . [START ON 11/30/2014] metoprolol succinate  50 mg Oral Daily  . polyethylene glycol  17 g Oral QHS  . pravastatin  20 mg Oral q1800  . senna  1 tablet Oral BID  . [START ON 11/30/2014] sertraline  25 mg Oral Daily  . tamsulosin  0.4 mg Oral Daily  . vitamin B-12  100 mcg Oral Daily    Assessment: 80yo male on Coumadin  5mg  daily x 2.5mg  on Sun/Tu/Fri; his INR is 1.97 today.  Hg 10.5 and pltc wnl.  His last dose of Coumadin was 12/25.  Goal of Therapy:  INR 2-3 Monitor platelets by anticoagulation protocol: Yes   Plan:  Coumadin 5mg  today Daily INR  Gracy Bruins, PharmD [facility]

## 2014-11-29 NOTE — Procedures (Signed)
Successful US guided right thoracentesis. Yielded 1.4 liters of bloody fluid. Pt tolerated procedure well. No immediate complications.  Specimen was sent for labs. CXR ordered.  Tsosie Billing D PA-C 11/29/2014 12:29 PM

## 2014-11-30 DIAGNOSIS — N183 Chronic kidney disease, stage 3 (moderate): Secondary | ICD-10-CM

## 2014-11-30 DIAGNOSIS — J438 Other emphysema: Secondary | ICD-10-CM

## 2014-11-30 DIAGNOSIS — I482 Chronic atrial fibrillation: Secondary | ICD-10-CM

## 2014-11-30 LAB — BASIC METABOLIC PANEL
Anion gap: 8 (ref 5–15)
BUN: 75 mg/dL — ABNORMAL HIGH (ref 6–23)
CHLORIDE: 104 meq/L (ref 96–112)
CO2: 30 mmol/L (ref 19–32)
Calcium: 9.7 mg/dL (ref 8.4–10.5)
Creatinine, Ser: 2.39 mg/dL — ABNORMAL HIGH (ref 0.50–1.35)
GFR, EST AFRICAN AMERICAN: 28 mL/min — AB (ref >90)
GFR, EST NON AFRICAN AMERICAN: 24 mL/min — AB (ref >90)
GLUCOSE: 85 mg/dL (ref 70–99)
Potassium: 4.3 mmol/L (ref 3.5–5.1)
SODIUM: 142 mmol/L (ref 135–145)

## 2014-11-30 LAB — CBC
HCT: 34.9 % — ABNORMAL LOW (ref 39.0–52.0)
Hemoglobin: 10.7 g/dL — ABNORMAL LOW (ref 13.0–17.0)
MCH: 28.8 pg (ref 26.0–34.0)
MCHC: 30.7 g/dL (ref 30.0–36.0)
MCV: 94.1 fL (ref 78.0–100.0)
Platelets: 177 10*3/uL (ref 150–400)
RBC: 3.71 MIL/uL — AB (ref 4.22–5.81)
RDW: 15 % (ref 11.5–15.5)
WBC: 7.6 10*3/uL (ref 4.0–10.5)

## 2014-11-30 LAB — PROTIME-INR
INR: 1.95 — ABNORMAL HIGH (ref 0.00–1.49)
PROTHROMBIN TIME: 22.4 s — AB (ref 11.6–15.2)

## 2014-11-30 MED ORDER — INSULIN ASPART 100 UNIT/ML ~~LOC~~ SOLN: 0.0000 [IU] | Freq: Every day | SUBCUTANEOUS | Status: DC

## 2014-11-30 MED ORDER — INSULIN ASPART 100 UNIT/ML ~~LOC~~ SOLN: 0.0000 [IU] | Freq: Three times a day (TID) | SUBCUTANEOUS | Status: DC

## 2014-11-30 NOTE — Progress Notes (Signed)
UR completed 

## 2014-11-30 NOTE — Progress Notes (Signed)
ANTICOAGULATION CONSULT NOTE - Follow Up Consult  Pharmacy Consult for Coumadin Indication: atrial fibrillation  Allergies  Allergen Reactions  . Penicillins Rash    Happened 60 years ago    Patient Measurements: Height: 6' (182.9 cm) Weight: 220 lb (99.791 kg) IBW/kg (Calculated) : 77.6 Heparin Dosing Weight:   Vital Signs: Temp: 98.4 F (36.9 C) (12/27 0627) Temp Source: Oral (12/27 0627) BP: 107/57 mmHg (12/27 0627) Pulse Rate: 79 (12/27 0627)  Labs:  Recent Labs  11/29/14 0820 11/30/14 0632  HGB 10.5* 10.7*  HCT 33.3* 34.9*  PLT 192 177  LABPROT 22.6* 22.4*  INR 1.97* 1.95*  CREATININE 2.52* 2.39*  TROPONINI <0.03  --     Estimated Creatinine Clearance: 29.7 mL/min (by C-G formula based on Cr of 2.39).   Medications:  Scheduled:  . carbidopa-levodopa  1 tablet Oral BID  . darifenacin  7.5 mg Oral Daily  . docusate sodium  100 mg Oral BID  . [START ON 12/01/2014] furosemide  20 mg Oral Q M,W,F  . gabapentin  200 mg Oral QPM  . insulin aspart  0-15 Units Subcutaneous TID WC  . insulin aspart  0-5 Units Subcutaneous QHS  . insulin glargine  40 Units Subcutaneous QHS  . levothyroxine  100 mcg Oral QAC breakfast  . metoprolol succinate  50 mg Oral Daily  . polyethylene glycol  17 g Oral QHS  . pravastatin  20 mg Oral q1800  . senna  1 tablet Oral BID  . sertraline  25 mg Oral Daily  . tamsulosin  0.4 mg Oral Daily  . vitamin B-12  100 mcg Oral Daily  . Warfarin - Pharmacist Dosing Inpatient   Does not apply q1800    Assessment: 80yo male on Coumadin for AFib, to be d/c'd home today on home dose.  INR 1.95- slightly decreased with admit INR 1.97.  I spoke with the patient and recommended he take Coumadin 5mg  today instead of his usual 2.5mg , which he has agreed to do.  He had no questions regarding his Coumadin therapy.  Goal of Therapy:  INR 2-3 Monitor platelets by anticoagulation protocol: Yes   Plan:  Coumadin 5mg  today, when he gets  home  Gracy Bruins, [facility]

## 2014-11-30 NOTE — Care Management Note (Signed)
    Page 1 of 1   11/30/2014     1:56:00 PM CARE MANAGEMENT NOTE 11/30/2014  Patient:  RAYSON, RANDO   Account Number:  [id number]  Date Initiated:  12/07/2014  Documentation initiated by:  [facility]  Subjective/Objective Assessment:   adm: Recurrent right pleural effusion     Action/Plan:   discharge planning   Anticipated DC Date:  11/30/2014   Anticipated DC Plan:  [place]         Choice offered to / List presented to:             Status of service:  Completed, signed off Medicare Important Message given?   (If response is "NO", the following Medicare IM given date fields will be blank) Date Medicare IM given:   Medicare IM given by:   Date Additional Medicare IM given:   Additional Medicare IM given by:    Discharge Disposition:  HOME/SELF CARE  Per UR Regulation:    If discussed at Long Length of Stay Meetings, dates discussed:    Comments:  11/30/14 12:40 CM went to unit to meet with pt to arrange for home oxygen. Pt already discharged from unit.  CM spoke with both MD and RN who  both state pt does not meet saturation requirements for home oxygen but family pays out of pocket for home oxygen and went home with his personal home oxygen.  No other CM needs were communicated.  Mariane Masters, BSn, Cm [phone].

## 2014-11-30 NOTE — Discharge Instructions (Signed)

## 2014-11-30 NOTE — Progress Notes (Signed)
Patient for discharge and case manager was consulted, pt's daughter claimed that pt has an O2 at home and his wife can bring it and will not wait for case manager, will discharge as soon as wife gets here.

## 2014-11-30 NOTE — Progress Notes (Addendum)
Patient discharged to home with home oxygen, instructions was given to patient's daughter and wife.

## 2014-11-30 NOTE — Discharge Summary (Signed)
Physician Discharge Summary  [name] [name] MWU:[id number] DOB: [birth year] DOA: 11/29/2014  PCP: Nyoka Cowden, MD  Admit date: 11/29/2014 Discharge date: 11/30/2014  Time spent: 35 minutes  Recommendations for Outpatient Follow-up:  1. Patient with recurrent pleural effusions, cytology from thoracentesis taken on 11/14/2014 reported as benign. Please follow up on pleural fluid cultures and repeat cytology done on this hospitalization  2. Patient to follow up with his Pulmonologist in 1 week.  3. Follow up on PT/INR, had INR of 1.95 on 11/30/2014  Discharge Diagnoses:  Principal Problem:   Recurrent right pleural effusion Active Problems:   Shortness of breath   B12 deficiency   Atrial fibrillation   BPH (benign prostatic hyperplasia)   Parkinson's disease   COPD with emphysema, mild   CKD (chronic kidney disease) stage 3, GFR 30-59 ml/min   Discharge Condition: Stable  Diet recommendation: Heart Healthy  Filed Weights   11/29/14 1359  Weight: 99.791 kg (220 lb)    History of present illness:  [name] [name] is a 80 y.o. male, with past medical history of atrial fibrillation on warfarin, diabetes mellitus, Parkinson disease, dyslipidemia, presents today with complaints of shortness of breath, vision to reports it is more exertional, patient was recently discharged from [facility] with diagnosis of right pleural effusion, status post thoracentesis on 1 L, appears to be exudate, cytology showing reactive mesothelial cells, patient is x-ray showing recurrence of his right pleural effusion, so hospitalist requested to admit the patient, he denies any chest pain, cough, fever, chills, hemoptysis.  Hospital Course:  Patient is a pleasant 80 year old gentleman with a past medical history of atrial fibrillation presently on warfarin, who was recently discharged from my service on 11/15/2014 at which time he presented with large right-sided pleural effusion. During that  hospitalization he underwent ultrasound-guided thoracentesis with removal of 1 L of fluid. Fluid analysis appeared consistent with exudative effusion. Cytology revealed benign, reactive mesothelial cells. Patient's presented to the hospital on 11/29/2014 with complaints of increasing shortness of breath. Initial chest x-ray showing increasing right-sided pleural effusion for which he underwent ultrasound-guided thoracentesis with removal of 1 L of pleural fluid. Patient reported significant relief after procedure. Repeat chest x-ray did not reveal pneumothorax. Patient otherwise remained hemodynamically stable, afebrile, nontoxic appearing. Lab work revealed stable creatinine, white count of 7600. Patient stated feeling well enough to go home. Will need close outpatient follow-up with pulmonary medicine. Plan to resume home health services.  Procedures:  Ultrasound-guided thoracentesis performed on 11/29/2014 with removal of 1 L of fluid  Consultations:  Interventional radiology  Discharge Exam: Filed Vitals:   11/30/14 0627  BP: 107/57  Pulse: 79  Temp: 98.4 F (36.9 C)  Resp: 18    General: Patient is awake, alert, no acute distress, states feeling better Cardiovascular: Regular rate and rhythm normal S1-S2 no murmurs rubs or gallops Respiratory: Overall clear to auscultation bilaterally no wheezing rhonchi or rales Abdomen: Soft nontender nondistended Extremities: No edema  Discharge Instructions   Discharge Instructions    Call MD for:  difficulty breathing, headache or visual disturbances    Complete by:  As directed      Call MD for:  extreme fatigue    Complete by:  As directed      Call MD for:  hives    Complete by:  As directed      Call MD for:  persistant dizziness or light-headedness    Complete by:  As directed      Call  MD for:  persistant nausea and vomiting    Complete by:  As directed      Call MD for:  redness, tenderness, or signs of infection (pain,  swelling, redness, odor or green/yellow discharge around incision site)    Complete by:  As directed      Call MD for:  severe uncontrolled pain    Complete by:  As directed      Call MD for:  temperature >100.4    Complete by:  As directed      Diet - low sodium heart healthy    Complete by:  As directed      Increase activity slowly    Complete by:  As directed           Current Discharge Medication List    CONTINUE these medications which have NOT CHANGED   Details  carbidopa-levodopa (SINEMET IR) 25-100 MG per tablet Take 1 tablet by mouth 2 (two) times daily. Qty: 180 tablet, Refills: 3    Cyanocobalamin (VITAMIN B 12 PO) Take 1 tablet by mouth daily.    diazepam (VALIUM) 2 MG tablet Take 1 tablet (2 mg total) by mouth 2 (two) times daily as needed for anxiety. Qty: 60 tablet, Refills: 5    furosemide (LASIX) 20 MG tablet 1 tablet Monday, Wednesday, Friday only Qty: 90 tablet, Refills: 3    gabapentin (NEURONTIN) 100 MG capsule Take 2 capsules (200 mg total) by mouth every evening. 1 at bedtime. Qty: 60 capsule, Refills: 3    insulin glargine (LANTUS) 100 UNIT/ML injection Inject 0.5 mLs (50 Units total) into the skin at bedtime. Qty: 10 mL, Refills: 11    levothyroxine (SYNTHROID, LEVOTHROID) 100 MCG tablet Take 1 tablet (100 mcg total) by mouth daily. Qty: 90 tablet, Refills: 0    lovastatin (MEVACOR) 20 MG tablet TAKE 1 TABLET BY MOUTH DAILY Qty: 30 tablet, Refills: 5    metoprolol (TOPROL-XL) 100 MG 24 hr tablet 1 half tablet daily Qty: 90 tablet, Refills: 3    polyethylene glycol (MIRALAX) powder Take 17 g by mouth at bedtime. For regularity    sertraline (ZOLOFT) 25 MG tablet Take 1 tablet (25 mg total) by mouth daily. Qty: 30 tablet, Refills: 11   Associated Diagnoses: Abnormal involuntary movements(781.0)    Tamsulosin HCl (FLOMAX) 0.4 MG CAPS Take 0.4 mg by mouth daily.     trospium (SANCTURA) 20 MG tablet Take 20 mg by mouth 2 (two) times daily.     warfarin (COUMADIN) 5 MG tablet Take as directed by anticoagulation clinic Qty: 105 tablet, Refills: 1    glucose blood test strip 1 each by Other route daily as needed for other. Qty: 100 each, Refills: 12    Insulin Pen Needle (PEN NEEDLES 31GX5/16") 31G X 8 MM MISC 1 each by Does not apply route as directed. Qty: 100 each, Refills: 1    Lancets (ONETOUCH ULTRASOFT) lancets 1 each by Other route daily as needed for other. Qty: 100 each, Refills: 12       Allergies  Allergen Reactions  . Penicillins Rash    Happened 60 years ago   Follow-up Information    Follow up with Nyoka Cowden, MD In 2 weeks.   Specialty:  Internal Medicine   Contact information:   [place] [address] [phone]       Follow up with Deneise Lever, MD In 1 week.   Specialty:  Pulmonary Disease   Contact information:  [address] [place] [address] [phone]        The results of significant diagnostics from this hospitalization (including imaging, microbiology, ancillary and laboratory) are listed below for reference.    Significant Diagnostic Studies: Dg Chest 1 View  11/29/2014   CLINICAL DATA:  Post thoracentesis with 0.25 L right-side.  EXAM: CHEST - 1 VIEW  COMPARISON:  11/29/2014 earlier.  FINDINGS: Lungs are hypoinflated with persistent small to moderate right pleural effusion improved compared to the previous exam. No evidence of pneumothorax. Stable cardiomegaly. Remainder of the exam is unchanged.  IMPRESSION: Continued small to moderate right pleural fluid collection with interval improvement. No pneumothorax.   Electronically Signed   By: Marin Olp M.D.   On: 11/29/2014 13:06   Dg Chest 1 View  11/14/2014   CLINICAL DATA:  80 year old male status post right-sided thoracentesis.  EXAM: CHEST - 1 VIEW  COMPARISON:  Chest CT 11/12/2014 and earlier.  FINDINGS: Portable AP upright view at 1423 hrs. No pneumothorax. Regressed veiling  opacity at the right lung base and mildly improved right lung base ventilation. Residual pleural fluid tracking into the minor fissure. Stable cardiomegaly and mediastinal contours. Left lung is stable. Visualized tracheal air column is within normal limits.  IMPRESSION: No pneumothorax, regressed right pleural effusion, and mildly improved right lung base ventilation following thoracentesis.   Electronically Signed   By: Lars Pinks M.D.   On: 11/14/2014 14:35   Dg Chest 2 View  11/29/2014   CLINICAL DATA:  Acute shortness of breath, right pleural effusion  EXAM: CHEST - 2 VIEW  COMPARISON:  11/24/2014  FINDINGS: Slight enlargement of the right pleural effusion compared to 11/24/2014. Right middle and lower lobe compressive atelectasis/ consolidation noted. Minor left base atelectasis. Heart remains enlarged with central vascular congestion. No definite edema pattern or CHF. Negative for pneumothorax. Trachea midline. Atherosclerosis of the tortuous aorta. Degenerative changes of the spine.  IMPRESSION: Slight increase in the right pleural effusion with right middle and lower lobe collapse/consolidation.  Stable cardiomegaly and vascular congestion   Electronically Signed   By: Daryll Brod M.D.   On: 11/29/2014 08:46   Dg Chest 2 View  11/24/2014   CLINICAL DATA:  Right thoracentesis. Follow-up. Short of breath. Atrial fibrillation, hypertension and diabetes.  EXAM: CHEST  2 VIEW  COMPARISON:  11/15/2014  FINDINGS: The heart remains enlarged. Left chest remains clear except for a small amount of pleural fluid in the posterior costophrenic angle. Moderate amount of dependent layering pleural fluid again demonstrated on the right, quite similar to the previous study. No pneumothorax. Basilar atelectasis associated with that right effusion. The aorta shows calcification and unfolding.  IMPRESSION: Similar appearance to the study of 11/15/2014. Moderate right effusion, present dependently, with associated  atelectasis of the right lower lung.   Electronically Signed   By: Nelson Chimes M.D.   On: 11/24/2014 10:38   Dg Chest 2 View  11/15/2014   CLINICAL DATA:  Atrial fibrillation.  Pleural effusion.  EXAM: CHEST  2 VIEW  COMPARISON:  11/14/2014  FINDINGS: Small right pleural effusion and right basilar atelectasis shows no significant change. Left lung remains clear. Heart size is stable.  IMPRESSION: No significant change in small right pleural effusion and right basilar atelectasis.   Electronically Signed   By: Earle Gell M.D.   On: 11/15/2014 09:55   Ct Chest Wo Contrast  11/12/2014   CLINICAL DATA:  Initial encounter for three day history of worsening shortness  of breath.  EXAM: CT CHEST WITHOUT CONTRAST  TECHNIQUE: Multidetector CT imaging of the chest was performed following the standard protocol without IV contrast.  COMPARISON:  None.  FINDINGS: Soft tissue / Mediastinum: There is no axillary lymphadenopathy. Scattered small mediastinal lymph nodes are evident and borderline enlarged. No gross hilar lymphadenopathy. The heart is enlarged. Coronary artery calcification is noted. No pericardial effusion.  Lungs / Pleura: There is a moderate to large right pleural effusion associated with right lower lobe collapse/ consolidation. This is difficult to further characterize given the lack of intravenous contrast. There is some mild dependent atelectasis in the left lung.  Bones: Bone windows reveal no worrisome lytic or sclerotic osseous lesions.  Upper Abdomen: Liver contour appears nodular, raising the question of, but not diagnostic for cirrhosis. No evidence for adrenal nodule or mass.  IMPRESSION: Moderate to large right pleural effusion with right lower lobe collapse/ consolidation. Limited study given the lack of intravenous contrast material. Nevertheless, given the unilateral disease, malignancy must be considered.   Electronically Signed   By: Misty Stanley M.D.   On: 11/12/2014 15:28   US Abdomen  Complete  11/13/2014   CLINICAL DATA:  Elevated alkaline phosphatase. Previous appendectomy and stones excision. BPH, renal stones, diabetes.  EXAM: ULTRASOUND ABDOMEN COMPLETE  COMPARISON:  CT of the abdomen and pelvis 10/12/2006  FINDINGS: Gallbladder: Gallbladder has a normal appearance. Gallbladder wall is 2.4 mm, within normal limits. No stones or pericholecystic fluid. No sonographic Murphy's sign.  Common bile duct: Diameter: 4.0 mm  Liver: No focal lesion identified. Within normal limits in parenchymal echogenicity.  IVC: No abnormality visualized.  Pancreas: There is limited visualization of the pancreas because of overlying bowel gas.  Spleen: Limited visualization of the spleen because of bowel gas.  Right Kidney: Length: 10.8 cm. Echogenic with renal parenchymal thinning noted. No focal mass. No hydronephrosis.  Left Kidney: Length: 12.1 cm. Echogenic with renal parenchymal thinning noted. Small upper pole cyst 1.4 cm. No hydronephrosis.  Abdominal aorta: The abdominal aorta is not well seen because of bowel gas.  Other findings: Study is technically difficult because of significant overlying bowel gas.  IMPRESSION: 1. No evidence for acute cholecystitis. 2. Portions of the abdomen are obscured by bowel gas. 3. Echogenic bilateral renal parenchyma. 4. No hydronephrosis.   Electronically Signed   By: Shon Hale M.D.   On: 11/13/2014 20:42   Dg Chest Port 1 View  11/12/2014   CLINICAL DATA:  Shortness of breath for 3 days.  EXAM: PORTABLE CHEST - 1 VIEW  COMPARISON:  05/16/2013.  FINDINGS: Mediastinum and hilar structures normal. Cardiomegaly with pulmonary venous congestion and bilateral pulmonary infiltrates. Particularly prominent on the right. Right pleural effusion. These findings are consistent with congestive heart failure. Superimposed pneumonia particularly in the right lung base cannot be excluded. No pneumothorax. No acute bony abnormality.  IMPRESSION: Congestive heart failure with  pulmonary edema and right-sided pleural effusion. Superimposed pneumonia particularly in the right lung base cannot be excluded.   Electronically Signed   By: Marcello Moores  Register   On: 11/12/2014 12:04   US Thoracentesis Asp Pleural Space W/img Guide  11/29/2014   INDICATION: Symptomatic right sided pleural effusion  EXAM: US THORACENTESIS ASP PLEURAL SPACE W/IMG GUIDE  COMPARISON:  11/14/2014.  MEDICATIONS: None  COMPLICATIONS: None immediate  TECHNIQUE: Informed written consent was obtained from the patient after a discussion of the risks, benefits and alternatives to treatment. A timeout was performed prior to the initiation of the  procedure.  Initial ultrasound scanning demonstrates a right pleural effusion. The lower chest was prepped and draped in the usual sterile fashion. 1% lidocaine was used for local anesthesia.  Under direct ultrasound guidance, a 19 gauge, 10-cm, Yueh catheter was introduced. An ultrasound image was saved for documentation purposes. The thoracentesis was performed. The catheter was removed and a dressing was applied. The patient tolerated the procedure well without immediate post procedural complication. The patient was escorted to have an upright chest radiograph.  FINDINGS: A total of approximately 1.4 liters of bloody fluid was removed. A fluid sample was sent for laboratory analysis.  IMPRESSION: Successful ultrasound guided right thoracentesis yielding 1.4 liters of pleural fluid.  Read By:  Tsosie Billing PA-C   Electronically Signed   By: Daryll Brod M.D.   On: 11/29/2014 13:37   US Thoracentesis Asp Pleural Space W/img Guide  11/14/2014   INDICATION: Symptomatic right sided pleural effusion  EXAM: US THORACENTESIS ASP PLEURAL SPACE W/IMG GUIDE  COMPARISON:  None  MEDICATIONS: 10 cc 1% lidocaine  COMPLICATIONS: None immediate  TECHNIQUE: Informed written consent was obtained from the patient after a discussion of the risks, benefits and alternatives to treatment. A timeout  was performed prior to the initiation of the procedure.  Initial ultrasound scanning demonstrates a left pleural effusion. The lower chest was prepped and draped in the usual sterile fashion. 1% lidocaine was used for local anesthesia.  Under direct ultrasound guidance, a 19 gauge, 7-cm, Yueh catheter was introduced. An ultrasound image was saved for documentation purposes. The thoracentesis was performed. The catheter was removed and a dressing was applied. The patient tolerated the procedure well without immediate post procedural complication. The patient was escorted to have an upright chest radiograph.  FINDINGS: A total of approximately 1 liters of bloody fluid was removed. Requested samples were sent to the laboratory.  IMPRESSION: Successful ultrasound-guided left sided thoracentesis yielding 1 liters of pleural fluid.  Read by:  Lavonia Drafts [facility]   Electronically Signed   By: Daryll Brod M.D.   On: 11/14/2014 14:25    Microbiology: Recent Results (from the past 240 hour(s))  Body fluid culture     Status: None (Preliminary result)   Collection Time: 11/29/14 12:33 PM  Result Value Ref Range Status   Specimen Description PLEURAL FLUID  Final   Special Requests NONE  Final   Gram Stain   Final    NO WBC SEEN NO ORGANISMS SEEN Performed at [facility]    Culture PENDING  Incomplete   Report Status PENDING  Incomplete     Labs: Basic Metabolic Panel:  Recent Labs Lab 11/29/14 0820 11/30/14 0632  NA 142 142  K 4.7 4.3  CL 106 104  CO2 27 30  GLUCOSE 126* 85  BUN 84* 75*  CREATININE 2.52* 2.39*  CALCIUM 9.8 9.7   Liver Function Tests: No results for input(s): AST, ALT, ALKPHOS, BILITOT, PROT, ALBUMIN in the last 168 hours. No results for input(s): LIPASE, AMYLASE in the last 168 hours. No results for input(s): AMMONIA in the last 168 hours. CBC:  Recent Labs Lab 11/29/14 0820 11/30/14 0632  WBC 6.4 7.6  NEUTROABS 4.1  --   HGB 10.5* 10.7*  HCT 33.3*  34.9*  MCV 93.0 94.1  PLT 192 177   Cardiac Enzymes:  Recent Labs Lab 11/29/14 0820  TROPONINI <0.03   BNP: BNP (last 3 results)  Recent Labs  11/12/14 0941  PROBNP 440.4   CBG:  Recent Labs Lab 11/29/14 2244  GLUCAP 132*       Signed:  Kelvin Cellar  [facility] 11/30/2014, 10:51 AM

## 2014-12-01 ENCOUNTER — Telehealth: Payer: Self-pay | Admitting: Internal Medicine

## 2014-12-01 NOTE — Telephone Encounter (Signed)
Pt's daughter called back-she is aware that we can see patient for follow up as he is established with [facility] already on Wednesday 12-03-14 at 10:15am. Nothing more needed at this time.

## 2014-12-01 NOTE — Telephone Encounter (Signed)
LMTCB-ask for [name] [name] ONLY!!!!

## 2014-12-02 ENCOUNTER — Ambulatory Visit: Payer: Medicare Other | Admitting: Physical Therapy

## 2014-12-02 LAB — BODY FLUID CULTURE
Culture: NO GROWTH
GRAM STAIN: NONE SEEN

## 2014-12-03 ENCOUNTER — Ambulatory Visit (INDEPENDENT_AMBULATORY_CARE_PROVIDER_SITE_OTHER): Payer: Medicare Other | Admitting: Internal Medicine

## 2014-12-03 ENCOUNTER — Encounter: Payer: Self-pay | Admitting: Internal Medicine

## 2014-12-03 ENCOUNTER — Encounter: Payer: Medicare Other | Admitting: Physical Therapy

## 2014-12-03 VITALS — BP 138/76 | HR 55 | Ht 72.0 in | Wt 224.0 lb

## 2014-12-03 DIAGNOSIS — R269 Unspecified abnormalities of gait and mobility: Secondary | ICD-10-CM | POA: Diagnosis present

## 2014-12-03 DIAGNOSIS — J90 Pleural effusion, not elsewhere classified: Secondary | ICD-10-CM

## 2014-12-03 NOTE — Therapy (Signed)
[facility] [address] [place], [place], [address] Phone: [phone]   Fax:  [phone]  Patient Details  Name: [name] [name] MRN: [id number] Date of Birth: 1931/02/19  Encounter Date: 12/03/2014 PHYSICAL THERAPY DISCHARGE SUMMARY  Visits from Start of Care: 2  Current functional level related to goals / functional outcomes: PT LONG TERM GOAL #1     Title  verbalize understanding of fall prevention techniques within the home environment.    Time  8    Period  Weeks    Status  New    PT LONG TERM GOAL #2    Title  perform at least 8 of 10 reps of sit<>stand transfers with no UE support, for improved transfer safety and efficiency.    Time  8    Period  Weeks    Status  New    PT LONG TERM GOAL #3    Title  improve TUG cognitive score to less than or equal to 28 seconds for decreased fall risk.    Time  8    Period  Weeks    Status  New    PT LONG TERM GOAL #4    Title  improve gait velocity to at least 2.3 ft/sec for improved gait efficiency and safety.    Time  8    Period  Weeks    Status  New    PT LONG TERM GOAL #5    Title  verbalize plans for continued community fitness upon D/C from PT.    Time  8    Period  Weeks    Status  New          Long term goals were not met, as pt did not return after 2nd PT visits on 11/06/14, due to hospitalization/decline in medical status   Remaining deficits: Pt not seen after 11/06/14 visit-see above  Education / Equipment: Initial education in HEP  Plan: Patient agrees to discharge.  Patient goals were not met. Patient is being discharged due to a change in medical status.  ?????Pt was hospitalized and requested to cancel all remaining appointments.      MARRIOTT,AMY W. 12/03/2014, 11:02 AM   Mady Haagensen, PT 12/03/2014 11:04 AM Phone: [phone] Fax: [facility] [address] [place], [place], [address] Phone: [phone]   Fax:  [phone]

## 2014-12-03 NOTE — Patient Instructions (Signed)
Order- referral to Thoracic surgery- Dr Hendrickson/ Servando Snare et al Recurrent unilateral exudative pleural effusion

## 2014-12-03 NOTE — Progress Notes (Signed)
Patient ID: [name] [name], male    DOB: 05-04-1931, 80 y.o.   MRN: [id number]  HPI  04/06/11- He was unable to continue CPAP. It starts out fine but when he gets up for bathroom at night, then comes back to bed, the mask always leaks and he is unable to tolerate. He is still having to get up 2-3 x / night for bathroom. He disconnects hose and keeps mask on. He thinks the mask warms  and softens with body heat. Problems center around his need to get up frequently, but he denies any cardiac events since last here.   05/16/11- OSA  Doesn't feel much daytime sleepiness, still takes naps and enjoys them. We discussed reasons for continuing CPAP. Mask either leaks or is too tight at times. [name] makes face itch. Discussed alternatives.  11/15/11- 57 yoM former smoker followed for OSA  Has had flu vaccine. Had a hypoglycemic episode earlier today but says he is back in control today, having eaten. Denies cough or wheeze but notices some exertional dyspnea, little changed. He has given up on CPAP and feels he sleeps well. He still wakes 3 or 4 times a night for nocturia and this was part of the problem with CPAP. He takes an afternoon nap most days.  05/15/12- 10 yoM former smoker followed for OSA   Patient states doing good. OSA-he failed CPAP but is using an oral appliance he got from his dentist, Dr. Archie [name]. Dry mouth from medications and frequent nocturia him up a lot at night and uses CPAP was too hard to manage. We reviewed his last chest x-ray. There is mild cardiac enlargement. He appears to be some fibrosis or possibly interstitial edema but it does not look like an active progressive condition. He denies cough or change in exercise tolerance. CXR - 02/05/12  IMPRESSION:  Stable chronic lung disease. No acute cardiopulmonary process.  Original Report Authenticated By: Vivia Ewing, M.D.   05/16/13- 32 yoM former smoker followed for OSA/ failed CPAP/ Oral appliance FOLLOWS FOR: states he is  sleeping fine, denies any snoring or breathing issues. Denies any wheezing, cough, or congestion. Has occasional SOB with the simplest jobs-walking, restroom,etc). Biotene helped dry mouth. Little coughing. He did not tolerate CPAP and feels stable and comfortable without it, denying significant snoring or sleepiness. PFT 06/04/2012 mild obstructive airways disease in small airways with insignificant response to bronchodilator, normal lung volumes, diffusion slightly reduced. FVC 3.66/82%, FEV1 2.53/88%, FEV1/FEC 0.69, FEF 25-75% 1.40/59%. RV 113%, TLC 98%, DLCO 72%  05/16/14- 86 yoM former smoker followed for OSA/ failed CPAP/ failed Oral appliance, complicated by AFib, Parkinsons, DM FOLLOWS FOR: Pt states that overall breathing has been doing well since last OV x 1 year ago. No complaints.  Now seeing a neurologist for diagnosis of Parkinson's. Sleep-had an oral appliance. Began making his mouth sore, did not help his sleep and he stopped using it. Breathing-doing well. He denies wheeze or cough. PFT 2013 showed mild obstructive airways disease with insignificant response to bronchodilator. He doesn't feel he has an active problem with this. CXR 05/16/13 IMPRESSION:  No acute findings.  Original Report Authenticated By: Lorin Picket, M.D.  12/03/14- 83 yoM former smoker followed for OSA/ failed CPAP/ failed Oral appliance, complicated by AFib,Chronic R CHF, Recurrent R pleural effusion  Parkinsons, DM, Renal Insufficiency    Wife and daughter here FOLLOWS FOR: pt had thoracentesis X2 this past week in the hospital.  Pt currently having increased sob  with exertion. Hospitalized for dyspnea with large right pleural effusion in mid December. Thoracentesis twice, 3 days apart, for 1 L volume removal of exudative fluid with RBCs, no growth and benign cytology. History of trauma several months ago when he fell, breaking his right ribs and hurting his right shoulder. He sleeps on his back, not in right  decubitus which might favor formation of a right effusion. CT without contrast (creatinine 2.39) did not identify a discrete mass. Each thoracentesis relieved shortness of breath for about 3 or 4 days. His wife thinks he needs it done again. Little cough or phlegm, no fever.  CXR 11/29/14  After T'centesis IMPRESSION: Continued small to moderate right pleural fluid collection with interval improvement. No pneumothorax. Electronically Signed  By: Marin Olp M.D.  On: 11/29/2014 13:06  Review of Systems-See HPI Constitutional:   No-   weight loss, night sweats, fevers, chills, fatigue, lassitude. HEENT:   No-  headaches, difficulty swallowing, tooth/dental problems, sore throat,       No-  sneezing, itching, ear ache, nasal congestion, post nasal drip,  CV:  No-   chest pain, orthopnea, PND, swelling in lower extremities, anasarca, dizziness, palpitations Resp: +  shortness of breath with exertion , not at rest.              No-   productive cough,  No non-productive cough,  No- coughing up of blood.              No-   change in color of mucus.  No- wheezing.   Skin: No-   rash or lesions. GI:  No-   heartburn, indigestion, abdominal pain, nausea, vomiting,  GU:  MS:  No-   joint pain or swelling.  Neuro-    + tremor Psych:  No- change in mood or affect. No depression or anxiety.  No memory loss.   Objective:   Physical Exam General- Alert, Oriented, Affect-appropriate and responsive, Distress- none acute Skin- rash-none, lesions- none, excoriation- none Lymphadenopathy- none Head- atraumatic            Eyes- Gross vision intact, PERRLA, conjunctivae clear secretions            Ears- Hearing, canals-normal            Nose- Clear, no-Septal dev, mucus, polyps, erosion, perforation             Throat- Mallampati II , mucosa very dry , drainage- none, tonsils- atrophic Neck- flexible , trachea midline, no stridor , thyroid nl, carotid no bruit Chest - symmetrical excursion ,  unlabored           Heart/CV- + IRR , no murmur , no gallop  , no rub, nl s1 s2                           - JVD- none , edema- none, stasis changes- none, varices- none           Lung- distant,  wheeze- none, cough- none , dullness+to right mid back, rub- none           Chest wall-  Abd-  Br/ Gen/ Rectal- Not done, not indicated Extrem- cyanosis- none, clubbing, none, atrophy- none, strength- nl, no clubbing. + Cane Neuro- + he holds hands to hide tremor

## 2014-12-03 NOTE — Assessment & Plan Note (Signed)
History of right-sided rib fractures from trauma and anticoagulation for atrial fibrillation, a bloody exudative pleural effusion is likely traumatic. However it is large enough to be symptomatic, recurrent several months after reported rib fractures. Possibility of malignant effusion must be considered despite negative cytologies. He also needs a more definitive management plan rather than repeat thoracentesis each week. Consider possibility of thoracoscopy, or at least a more definitive drainage. Plan-I recommend referral for thoracic surgery consultation

## 2014-12-04 ENCOUNTER — Telehealth: Payer: Self-pay | Admitting: Internal Medicine

## 2014-12-04 ENCOUNTER — Ambulatory Visit: Payer: Medicare Other | Admitting: Physical Therapy

## 2014-12-04 NOTE — Telephone Encounter (Signed)
Received a call from [place] at [facility] asking if pt was ambulated with and without oxygen.  Advised that pt last saw Dr. Raliegh Ip on 12.21.2015 and was seen by pulmonology on 12.30.2015.  Advised if additional information was needed that pulmonary would be the one to ask.  Melissa called pulmonary and the office closed at noon today.  Per Melissa medicare requires that pt's at rest oxygen be 88 or below to qualify for oxygen.

## 2014-12-04 NOTE — Telephone Encounter (Signed)
[facility] reporting protime results  2.5 This was taken 12/30 at 6pm pls call w/ any instructions

## 2014-12-04 NOTE — Telephone Encounter (Signed)
Disused with Dr.K and is aware and agrees that if pt's oxygen is 95% at rest then pt does not need oxygen at this time.

## 2014-12-04 NOTE — Telephone Encounter (Signed)
daughter terry called to ask if they could get pt on O2 today for this weekend. Pt has been to the ED 2 times this week w/ his lungs filing up.  Daughter states if they can get the O2, he could make it through the weekend. Pt has appt w/ pulmonary on Mon, but they need to get through til then. advised terry she is not on DPR and we would only be able to call the pt back. Coralyn Mark verbalized understanding. Would like a cb asap concerning this.

## 2014-12-04 NOTE — Telephone Encounter (Signed)
Left a detailed message on home vm explaining about oxygen therapy and that if pt had any concerns he should seek medical attention immediately.

## 2014-12-04 NOTE — Telephone Encounter (Signed)
Faxed over request to [facility]- noted urgent request and delivery today.

## 2014-12-04 NOTE — Telephone Encounter (Signed)
Please arrange for home O2 flow rate, 2 L/m nasal cannula.  Please arrange for delivery today and notify family

## 2014-12-04 NOTE — Telephone Encounter (Signed)
Attempted to call pt and spouse on all numbers provided left a message for return call.  Additional information can be dicussed at upcoming appt on 1.4.2016 and with Dr. Keturah Barre.  If pt has severe symptoms over the holiday weekend pt should seek medical attention immediately.

## 2014-12-04 NOTE — Telephone Encounter (Signed)
Pt has upcoming appt with pulmonologist on Monday 1.4.2016. Should he wait to see them?

## 2014-12-04 NOTE — Telephone Encounter (Signed)
Called and spoke with Baker Janus (pt's spouse) and she is aware that information has been sent to [facility] and they should contact her directly.

## 2014-12-08 ENCOUNTER — Ambulatory Visit (INDEPENDENT_AMBULATORY_CARE_PROVIDER_SITE_OTHER): Payer: Self-pay | Admitting: Family

## 2014-12-08 ENCOUNTER — Other Ambulatory Visit: Payer: Self-pay | Admitting: *Deleted

## 2014-12-08 ENCOUNTER — Institutional Professional Consult (permissible substitution) (INDEPENDENT_AMBULATORY_CARE_PROVIDER_SITE_OTHER): Payer: Medicare Other | Admitting: Cardiothoracic Surgery

## 2014-12-08 ENCOUNTER — Encounter: Payer: Self-pay | Admitting: Thoracic Surgery (Cardiothoracic Vascular Surgery)

## 2014-12-08 VITALS — BP 114/66 | HR 82 | Resp 20 | Ht 72.0 in | Wt 224.0 lb

## 2014-12-08 DIAGNOSIS — Z5181 Encounter for therapeutic drug level monitoring: Secondary | ICD-10-CM

## 2014-12-08 DIAGNOSIS — J90 Pleural effusion, not elsewhere classified: Secondary | ICD-10-CM

## 2014-12-08 DIAGNOSIS — J948 Other specified pleural conditions: Secondary | ICD-10-CM

## 2014-12-08 DIAGNOSIS — I482 Chronic atrial fibrillation, unspecified: Secondary | ICD-10-CM

## 2014-12-08 LAB — POCT INR: INR: 2.5

## 2014-12-08 NOTE — Telephone Encounter (Signed)
Almyra Free, RN with [facility] aware to recheck in 3 weeks

## 2014-12-08 NOTE — Progress Notes (Signed)
PCP is Nyoka Cowden, MD Referring Provider is Deneise Lever, MD  Chief Complaint  Patient presents with  . Pleural Effusion    Surgical eval on right pleural effusion s/p right thoracentesis on 11/29/14   Patient examined, CT scan of chest reviewed  HPI: 81 year old Caucasian male diabetic presents with recurrent right pleural effusions associated with a slow decline in general health and weight loss over the past several months. In the past 30 days the patient has required thoracentesis for right pleural effusion twice, each time over 1 L of bloody pleural fluid was removed. Cytology on the fluid has been negative. CT scan of the chest was lacking in detail because of a noncontrast study-history chronic renal failure, and the presence of a large right pleural effusion at the time of the imaging.the patient did not appear to have any rib fractures on the CT scan. the patient currently has symptoms of recurrent shortness of breath after having a thoracentesis 8 days ago. The patient's pulmonologist is concerned regarding an underlying malignancy and is requested thoracic surgical evaluation and therapy.  The patient sustained a fall at home last summer. Chest x-ray that time was unremarkable. This past winter the patient started developing recurrent right pleural effusion which was bloody. He is on chronic Coumadin for atrial fibrillation and a history of stroke with INR running approximately 2.5.the patient's family states he has had proximal a 3 falls over the past 6 months.  Past Medical History  Diagnosis Date  . Family history of malignant neoplasm of gastrointestinal tract   . Unspecified constipation   . Abnormal involuntary movements(781.0)   . Vitamin B12 deficiency   . Claudication   . Hypertrophy of prostate with urinary obstruction and other lower urinary tract symptoms (LUTS)   . Obesity   . Mixed hyperlipidemia   . Atrial fibrillation     --myoview 07/2006: not  gated. No ischemia or infarct  Diastolic HF-- Echo 04/1024 ER60% mild AI/MR. RV mild to moderately dilated/ dysfunction. ? restrictive CM . RVSP 36  . Bladder polyps   . Kidney stones   . Unspecified sleep apnea     NPSG 05/14/2007- AHI 80.7/ hr  . OSA (obstructive sleep apnea)     "tried mask; couldn't use it; so I quit" (11/12/2014)  . Hypothyroidism   . Type II or unspecified type diabetes mellitus with neurological manifestations, not stated as uncontrolled   . History of hiatal hernia   . Daily headache     "I've had headaches all my life; get them almost daily" (11/12/2014)  . Stroke ~ 1985    "simple stroke" denies residual on 11/12/2014  . Arthritis     "just about qwhere"   . Pleural effusion 11/12/2014 admission    Past Surgical History  Procedure Laterality Date  . Appendectomy    . Kidney stone surgery      Kidney stone retrieval with uretal stent x 2   . Bladder polyps    . Transurethral resection of bladder    . Orif elbow fracture    . Fracture surgery    . Excisional hemorrhoidectomy  1950's  . Ureteral stent placement  X 1  . Cystoscopy w/ stone manipulation  X 2  . Cataract extraction w/ intraocular lens  implant, bilateral Bilateral     Family History  Problem Relation Age of Onset  . Lung cancer Mother 55    nonsmoker  . Colon cancer Sister   . Prostate cancer Paternal Uncle   .  Stroke Father   . Hip fracture Father   . Healthy Son   . Healthy Daughter     Social History History  Substance Use Topics  . Smoking status: Former Smoker -- 1.00 packs/day for 35 years    Types: Cigarettes    Quit date: 12/05/1985  . Smokeless tobacco: Never Used  . Alcohol Use: 0.0 oz/week    0 Not specified per week     Comment: 11/12/2014 "used to drink beer q now and then; not anymore"    Current Outpatient Prescriptions  Medication Sig Dispense Refill  . carbidopa-levodopa (SINEMET IR) 25-100 MG per tablet Take 1 tablet by mouth 2 (two) times daily. 180 tablet  3  . Cyanocobalamin (VITAMIN B 12 PO) Take 1 tablet by mouth daily.    . diazepam (VALIUM) 2 MG tablet Take 1 tablet (2 mg total) by mouth 2 (two) times daily as needed for anxiety. (Patient taking differently: Take 2 mg by mouth every evening. ) 60 tablet 5  . furosemide (LASIX) 20 MG tablet 1 tablet Monday, Wednesday, Friday only (Patient taking differently: Take 20 mg by mouth every Monday, Wednesday, and Friday. 1 tablet Monday, Wednesday, Friday only) 90 tablet 3  . gabapentin (NEURONTIN) 100 MG capsule Take 2 capsules (200 mg total) by mouth every evening. 1 at bedtime. (Patient taking differently: Take 200 mg by mouth every evening. ) 60 capsule 3  . glucose blood test strip 1 each by Other route daily as needed for other. 100 each 12  . insulin glargine (LANTUS) 100 UNIT/ML injection Inject 0.5 mLs (50 Units total) into the skin at bedtime. 10 mL 11  . Insulin Pen Needle (PEN NEEDLES 31GX5/16") 31G X 8 MM MISC 1 each by Does not apply route as directed. 100 each 1  . Lancets (ONETOUCH ULTRASOFT) lancets 1 each by Other route daily as needed for other. 100 each 12  . levothyroxine (SYNTHROID, LEVOTHROID) 100 MCG tablet Take 1 tablet (100 mcg total) by mouth daily. 90 tablet 0  . lovastatin (MEVACOR) 20 MG tablet TAKE 1 TABLET BY MOUTH DAILY (Patient taking differently: Take 20 mg by mouth every morning. TAKE 1 TABLET BY MOUTH DAILY) 30 tablet 5  . metoprolol (TOPROL-XL) 100 MG 24 hr tablet 1 half tablet daily (Patient taking differently: Take 50 mg by mouth every morning. 1 half tablet daily) 90 tablet 3  . polyethylene glycol (MIRALAX) powder Take 17 g by mouth at bedtime. For regularity    . sertraline (ZOLOFT) 25 MG tablet Take 1 tablet (25 mg total) by mouth daily. (Patient taking differently: Take 25 mg by mouth every morning. ) 30 tablet 11  . Tamsulosin HCl (FLOMAX) 0.4 MG CAPS Take 0.4 mg by mouth daily.     . trospium (SANCTURA) 20 MG tablet Take 20 mg by mouth 2 (two) times daily.     [name] warfarin (COUMADIN) 5 MG tablet Take as directed by anticoagulation clinic (Patient taking differently: Take 2.5-5 mg by mouth every evening. Take half of a tablet (2.5mg ) on Sundays, Tuesdays, and Fridays.  On all other days, take a whole tablet (5mg ).) 105 tablet 1   No current facility-administered medications for this visit.    Allergies  Allergen Reactions  . Penicillins Rash    Happened 60 years ago    Review of Systemsthe patient complains of severe constipation after he was hospitalized approximately 24 hours for thoracentesis on December 28  The patient uses a cane to walk and is unsteady  on his feet The patient has severe sleep apnea without significant COPD The patient has a history of smoking but stopped 25 years ago  Patient has a history of 20 to-25 pound weight loss over the past 8-12 months  The patient has Parkinson's disease which was diagnosed 1 year ago and has progressed significantlyaccording to  the family  BP 114/66 mmHg  Pulse 82  Resp 20  Ht 6' (1.829 m)  Wt 224 lb (101.606 kg)  BMI 30.37 kg/m2  SpO2 90% Physical Exam Gen. Appearance-elderly fragile chronically ill male unsteady on his feet and in poor health HEENT-normocephalic dentition adequate no palpable cervical adenopathy, lips  slightly cyanotic Thorax-diminished breath sounds on right Cardiac-irregular heart rhythm of atrial fibrillation without gallop Abdomen-nontender without pulsatile mass Extremities-1+ pedal edema Neurologic-shuffled gait, poor core  strength  Diagnostic Tests: CT scan and recent chest x-rays reviewed Recent thoracentesis she is reviewed with cytology reports  Impression: Recurrent right bloody pleural effusion History of falls from progressive Parkinson's disease Chronic Coumadin therapy for atrial fibrillation with elevated INR  Plan:recurrent bloody pleural effusion  from lung cancer not clearly noted on the noncontrasted CT scan versus posttraumatic pleural  inflammation with chronic anti-coagulation to cause bleeding  The patient is a very poor candidate for general anesthesia and VATS for drainage and biopsy.he would probably end up in a nursing home after that type of surgery. I would recommend Pleurx catheter placement under conscious sedation, repeat a CT scan of the chest after the effusion has been evacuated by the Pleurx catheter, and stop Coumadin for least 2 weeks. A noncontrasted CT scan after evacuation of the pleural effusion would be much more informative and could direct potential transthoracic biopsy.  Today in the office I performed a right thoracentesis and drained 1.5 L of serosanguineous fluid.  The patient will be scheduled for a right Pleurx catheter on January 7 outpatient at [facility].

## 2014-12-08 NOTE — Patient Instructions (Addendum)
Continue taking 1 tablet every day except 1/2 tablet on Sun/Tues/Fri.  Recheck INR in 3 week.    Anticoagulation Dose Instructions as of 12/08/2014      [name] [name] Tue Wed Thu Fri Sat   New Dose 2.5 mg 5 mg 2.5 mg 5 mg 5 mg 2.5 mg 5 mg    Description        Continue taking 1 tablet every day except 1/2 tablet on Sun/Tues/Fri.  Recheck INR in 3 week.

## 2014-12-08 NOTE — Telephone Encounter (Signed)
Continue current dose. Recheck in 3 weeks.

## 2014-12-10 ENCOUNTER — Telehealth: Payer: Self-pay | Admitting: Internal Medicine

## 2014-12-10 NOTE — Telephone Encounter (Signed)
Called pt to fu on pt's cancelled appt through epic for Friday.  Wife states yes they cancelled bc pt is having procedure Fri am (5:30) to have a tube inserted.  Would like dr Raliegh Ip to call and touch base w/ them. Wife states they are just a little unsure about things right now.  Made OV appt for Mon 4pm. .

## 2014-12-11 ENCOUNTER — Encounter (HOSPITAL_COMMUNITY): Payer: Self-pay | Admitting: *Deleted

## 2014-12-11 MED ORDER — VANCOMYCIN HCL 10 G IV SOLR
1500.0000 mg | INTRAVENOUS | Status: AC
  Administered 2014-12-12: 1500 mg via INTRAVENOUS
  Filled 2014-12-11: qty 1500

## 2014-12-11 NOTE — Telephone Encounter (Signed)
Called and left message.

## 2014-12-11 NOTE — Telephone Encounter (Signed)
Please see message, and call pt.

## 2014-12-12 ENCOUNTER — Encounter (HOSPITAL_COMMUNITY)
Admission: RE | Disposition: A | Discharge status: Home / Self Care | Payer: Self-pay | Source: Ambulatory Visit | Attending: Cardiothoracic Surgery

## 2014-12-12 ENCOUNTER — Ambulatory Visit (HOSPITAL_COMMUNITY): Payer: Medicare Other | Admitting: Certified Registered Nurse Anesthetist

## 2014-12-12 ENCOUNTER — Ambulatory Visit (HOSPITAL_COMMUNITY): Payer: Medicare Other

## 2014-12-12 ENCOUNTER — Ambulatory Visit (HOSPITAL_COMMUNITY)
Admission: RE | Admit: 2014-12-12 | Discharge: 2014-12-12 | Disposition: A | Discharge status: Home / Self Care | Payer: Medicare Other | Source: Ambulatory Visit | Attending: Cardiothoracic Surgery | Admitting: Cardiothoracic Surgery

## 2014-12-12 ENCOUNTER — Ambulatory Visit: Payer: Medicare Other | Admitting: Internal Medicine

## 2014-12-12 ENCOUNTER — Encounter (HOSPITAL_COMMUNITY): Payer: Self-pay | Admitting: *Deleted

## 2014-12-12 DIAGNOSIS — I4891 Unspecified atrial fibrillation: Secondary | ICD-10-CM | POA: Diagnosis not present

## 2014-12-12 DIAGNOSIS — Z87891 Personal history of nicotine dependence: Secondary | ICD-10-CM | POA: Insufficient documentation

## 2014-12-12 DIAGNOSIS — I739 Peripheral vascular disease, unspecified: Secondary | ICD-10-CM | POA: Diagnosis not present

## 2014-12-12 DIAGNOSIS — N289 Disorder of kidney and ureter, unspecified: Secondary | ICD-10-CM | POA: Insufficient documentation

## 2014-12-12 DIAGNOSIS — Z9689 Presence of other specified functional implants: Secondary | ICD-10-CM

## 2014-12-12 DIAGNOSIS — Z794 Long term (current) use of insulin: Secondary | ICD-10-CM | POA: Insufficient documentation

## 2014-12-12 DIAGNOSIS — E119 Type 2 diabetes mellitus without complications: Secondary | ICD-10-CM | POA: Insufficient documentation

## 2014-12-12 DIAGNOSIS — I509 Heart failure, unspecified: Secondary | ICD-10-CM | POA: Diagnosis not present

## 2014-12-12 DIAGNOSIS — J90 Pleural effusion, not elsewhere classified: Secondary | ICD-10-CM | POA: Insufficient documentation

## 2014-12-12 DIAGNOSIS — Z8673 Personal history of transient ischemic attack (TIA), and cerebral infarction without residual deficits: Secondary | ICD-10-CM | POA: Diagnosis not present

## 2014-12-12 DIAGNOSIS — G4730 Sleep apnea, unspecified: Secondary | ICD-10-CM | POA: Diagnosis not present

## 2014-12-12 DIAGNOSIS — M1990 Unspecified osteoarthritis, unspecified site: Secondary | ICD-10-CM | POA: Insufficient documentation

## 2014-12-12 DIAGNOSIS — J439 Emphysema, unspecified: Secondary | ICD-10-CM | POA: Diagnosis not present

## 2014-12-12 DIAGNOSIS — E039 Hypothyroidism, unspecified: Secondary | ICD-10-CM | POA: Diagnosis not present

## 2014-12-12 HISTORY — DX: Reserved for inherently not codable concepts without codable children: IMO0001

## 2014-12-12 HISTORY — PX: CHEST TUBE INSERTION: SHX231

## 2014-12-12 HISTORY — DX: Cardiac arrhythmia, unspecified: I49.9

## 2014-12-12 LAB — COMPREHENSIVE METABOLIC PANEL
ALT: 5 U/L (ref 0–53)
AST: 31 U/L (ref 0–37)
Albumin: 3.2 g/dL — ABNORMAL LOW (ref 3.5–5.2)
Alkaline Phosphatase: 268 U/L — ABNORMAL HIGH (ref 39–117)
Anion gap: 8 (ref 5–15)
BUN: 88 mg/dL — ABNORMAL HIGH (ref 6–23)
CO2: 28 mmol/L (ref 19–32)
Calcium: 10.1 mg/dL (ref 8.4–10.5)
Chloride: 107 mEq/L (ref 96–112)
Creatinine, Ser: 2.82 mg/dL — ABNORMAL HIGH (ref 0.50–1.35)
GFR calc Af Amer: 23 mL/min — ABNORMAL LOW (ref >90)
GFR calc non Af Amer: 20 mL/min — ABNORMAL LOW (ref >90)
Glucose, Bld: 81 mg/dL (ref 70–99)
Potassium: 4.3 mmol/L (ref 3.5–5.1)
Sodium: 143 mmol/L (ref 135–145)
Total Bilirubin: 0.4 mg/dL (ref 0.3–1.2)
Total Protein: 7 g/dL (ref 6.0–8.3)

## 2014-12-12 LAB — CBC
HCT: 35.1 % — ABNORMAL LOW (ref 39.0–52.0)
Hemoglobin: 11.1 g/dL — ABNORMAL LOW (ref 13.0–17.0)
MCH: 29 pg (ref 26.0–34.0)
MCHC: 31.6 g/dL (ref 30.0–36.0)
MCV: 91.6 fL (ref 78.0–100.0)
Platelets: 232 10*3/uL (ref 150–400)
RBC: 3.83 MIL/uL — ABNORMAL LOW (ref 4.22–5.81)
RDW: 14.6 % (ref 11.5–15.5)
WBC: 8.6 10*3/uL (ref 4.0–10.5)

## 2014-12-12 LAB — GLUCOSE, CAPILLARY
Glucose-Capillary: 84 mg/dL (ref 70–99)
Glucose-Capillary: 66 mg/dL — ABNORMAL LOW (ref 70–99)
Glucose-Capillary: 84 mg/dL (ref 70–99)
Glucose-Capillary: 81 mg/dL (ref 70–99)

## 2014-12-12 LAB — PROTIME-INR
INR: 1.41 (ref 0.00–1.49)
Prothrombin Time: 17.3 seconds — ABNORMAL HIGH (ref 11.6–15.2)

## 2014-12-12 LAB — APTT: aPTT: 31 seconds (ref 24–37)

## 2014-12-12 SURGERY — INSERTION, PLEURAL DRAINAGE CATHETER
Anesthesia: Monitor Anesthesia Care | Site: Chest | Laterality: Right

## 2014-12-12 MED ORDER — PROPOFOL 10 MG/ML IV BOLUS
INTRAVENOUS | Status: AC
  Filled 2014-12-12: qty 20

## 2014-12-12 MED ORDER — 0.9 % SODIUM CHLORIDE (POUR BTL) OPTIME
TOPICAL | Status: DC | PRN
  Administered 2014-12-12: 1000 mL

## 2014-12-12 MED ORDER — FENTANYL CITRATE 0.05 MG/ML IJ SOLN
INTRAMUSCULAR | Status: AC
  Filled 2014-12-12: qty 5

## 2014-12-12 MED ORDER — MIDAZOLAM HCL 2 MG/2ML IJ SOLN
INTRAMUSCULAR | Status: AC
  Filled 2014-12-12: qty 2

## 2014-12-12 MED ORDER — ONDANSETRON HCL 4 MG/2ML IJ SOLN: 4.0000 mg | Freq: Once | INTRAMUSCULAR | Status: AC | PRN

## 2014-12-12 MED ORDER — DEXTROSE 50 % IV SOLN
INTRAVENOUS | Status: AC
  Filled 2014-12-12: qty 50

## 2014-12-12 MED ORDER — LIDOCAINE HCL 1 % IJ SOLN
INTRAMUSCULAR | Status: DC | PRN
  Administered 2014-12-12: 10 mL

## 2014-12-12 MED ORDER — ROCURONIUM BROMIDE 50 MG/5ML IV SOLN
INTRAVENOUS | Status: AC
  Filled 2014-12-12: qty 1

## 2014-12-12 MED ORDER — HYDROMORPHONE HCL 1 MG/ML IJ SOLN: 0.2500 mg | INTRAMUSCULAR | Status: DC | PRN

## 2014-12-12 MED ORDER — SUCCINYLCHOLINE CHLORIDE 20 MG/ML IJ SOLN
INTRAMUSCULAR | Status: AC
Start: 2014-12-12 — End: 2014-12-12
  Filled 2014-12-12: qty 1

## 2014-12-12 MED ORDER — EPHEDRINE SULFATE 50 MG/ML IJ SOLN
INTRAMUSCULAR | Status: AC
  Filled 2014-12-12: qty 1

## 2014-12-12 MED ORDER — PHENYLEPHRINE 40 MCG/ML (10ML) SYRINGE FOR IV PUSH (FOR BLOOD PRESSURE SUPPORT)
PREFILLED_SYRINGE | INTRAVENOUS | Status: AC
  Filled 2014-12-12: qty 10

## 2014-12-12 MED ORDER — LACTATED RINGERS IV SOLN
INTRAVENOUS | Status: DC | PRN
  Administered 2014-12-12: 07:00:00 via INTRAVENOUS

## 2014-12-12 MED ORDER — PROPOFOL INFUSION 10 MG/ML OPTIME
INTRAVENOUS | Status: DC | PRN
  Administered 2014-12-12: 50 ug/kg/min via INTRAVENOUS

## 2014-12-12 MED ORDER — ONDANSETRON HCL 4 MG/2ML IJ SOLN
INTRAMUSCULAR | Status: AC
  Filled 2014-12-12: qty 2

## 2014-12-12 MED ORDER — SODIUM CHLORIDE 0.9 % IJ SOLN
INTRAMUSCULAR | Status: AC
  Filled 2014-12-12: qty 10

## 2014-12-12 MED ORDER — DEXTROSE 50 % IV SOLN: 12.5000 mL | Freq: Once | INTRAVENOUS | Status: DC

## 2014-12-12 MED ORDER — LIDOCAINE HCL (CARDIAC) 20 MG/ML IV SOLN
INTRAVENOUS | Status: AC
Start: 2014-12-12 — End: 2014-12-12
  Filled 2014-12-12: qty 5

## 2014-12-12 SURGICAL SUPPLY — 29 items
ADH SKN CLS APL DERMABOND .7 (GAUZE/BANDAGES/DRESSINGS) ×1
CANISTER SUCTION 2500CC (MISCELLANEOUS) ×3 IMPLANT
COVER SURGICAL LIGHT HANDLE (MISCELLANEOUS) ×3 IMPLANT
DERMABOND ADVANCED (GAUZE/BANDAGES/DRESSINGS) ×2
DERMABOND ADVANCED .7 DNX12 (GAUZE/BANDAGES/DRESSINGS) ×1 IMPLANT
DRAPE C-ARM 42X72 X-RAY (DRAPES) ×3 IMPLANT
DRAPE LAPAROSCOPIC ABDOMINAL (DRAPES) ×3 IMPLANT
GLOVE BIO SURGEON STRL SZ7.5 (GLOVE) ×6 IMPLANT
GOWN STRL REUS W/ TWL LRG LVL3 (GOWN DISPOSABLE) ×2 IMPLANT
GOWN STRL REUS W/TWL LRG LVL3 (GOWN DISPOSABLE) ×6
KIT BASIN OR (CUSTOM PROCEDURE TRAY) ×3 IMPLANT
KIT PLEURX DRAIN CATH 1000ML (MISCELLANEOUS) ×3 IMPLANT
KIT PLEURX DRAIN CATH 15.5FR (DRAIN) ×3 IMPLANT
KIT ROOM TURNOVER OR (KITS) ×3 IMPLANT
LIQUID BAND (GAUZE/BANDAGES/DRESSINGS) ×2 IMPLANT
NDL HYPO 25GX1X1/2 BEV (NEEDLE) ×1 IMPLANT
NEEDLE HYPO 25GX1X1/2 BEV (NEEDLE) ×3 IMPLANT
NS IRRIG 1000ML POUR BTL (IV SOLUTION) ×3 IMPLANT
PACK GENERAL/GYN (CUSTOM PROCEDURE TRAY) ×3 IMPLANT
PAD ARMBOARD 7.5X6 YLW CONV (MISCELLANEOUS) ×6 IMPLANT
SET DRAINAGE LINE (MISCELLANEOUS) IMPLANT
SUT ETHILON 3 0 PS 1 (SUTURE) ×3 IMPLANT
SUT SILK 2 0 SH (SUTURE) ×3 IMPLANT
SUT VIC AB 3-0 SH 27 (SUTURE) ×3
SUT VIC AB 3-0 SH 27X BRD (SUTURE) IMPLANT
SYR CONTROL 10ML LL (SYRINGE) ×3 IMPLANT
TOWEL OR 17X24 6PK STRL BLUE (TOWEL DISPOSABLE) ×3 IMPLANT
TOWEL OR 17X26 10 PK STRL BLUE (TOWEL DISPOSABLE) ×3 IMPLANT
WATER STERILE IRR 1000ML POUR (IV SOLUTION) ×3 IMPLANT

## 2014-12-12 NOTE — Op Note (Signed)
NAMETRINI, CHRISTIANSEN NO.:  [id number]  MEDICAL RECORD NO.:  [id number]  LOCATION:  [facility]                         FACILITY:  [facility]  PHYSICIAN:  Ivin Poot, M.D.  DATE OF BIRTH:  February 20, 1931  DATE OF PROCEDURE:  12/12/2014 DATE OF DISCHARGE:                              OPERATIVE REPORT   OPERATION:  Placement of right PleurX catheter.  PREOPERATIVE DIAGNOSIS:  Recurrent nonspecific right pleural effusion.  POSTOPERATIVE DIAGNOSIS:  Recurrent nonspecific right pleural effusion.  SURGEON:  Ivin Poot, M.D.  ANESTHESIA:  IV conscious sedation monitored by Anesthesia and local 1% lidocaine.  OPERATIVE PROCEDURE:  The patient was brought to the operating room after being evaluated for recurrent right pleural effusion requiring thoracentesis at least 2 times.  Previous cytopathology was negative for malignancy.  The patient is on chronic Coumadin and had suffered a fall from his Parkinson disease few weeks earlier.  I discussed the procedure of PleurX catheter with the patient and his wife including the indications, benefits, alternatives, and risks.  DESCRIPTION OF PROCEDURE:  The patient was brought to the operating room and placed supine on the OR table.  The right chest was prepped and draped as a sterile field.  A proper time-out was performed.  Lidocaine 1% was infiltrated in the anterior axillary line at the fifth interspace and in the midclavicular line at the right costal margin.  A small incision was made in the upper anesthetized area.  Through this, a 22- gauge needle was used to aspirate serosanguineous fluid from the pleural space.  At the same angle of this needle, a large needle with sheath was placed in the pleural space and needle was withdrawn.  Through this sheath, a guidewire was passed.  The guidewire positioned and the pleural space was documented by C-arm fluoroscopy.  Next, a small incision was made at the lower area by  Anesthesia.  The PleurX catheter was then tunneled from the lower incision to the upper incision.  It was cut to the appropriate length.  Next, a dilator was passed over the guidewire and then a guidewire-sheath system.  The dilator was removed and through the sheath, the PleurX catheter was advanced and the sheath was removed.  The catheter was then used to drain 1 L of serosanguineous fluid from the right pleural space, which was sent for cytopathology.  The position of the PleurX was confirmed by C-arm fluoroscopy.  The incisions were then closed.  The upper incision was closed with subcutaneous Vicryl and 2 interrupted nylon sutures.  The lower incision was closed with a single silk suture, which secured the catheter to the skin.  Sterile dressing was applied, and the patient returned to the recovery room in stable condition for chest x-ray.     Ivin Poot, M.D.     PV/MEDQ  D:  12/12/2014  T:  12/12/2014  Job:  [id number]  cc:   Tarri Fuller D. Annamaria Boots, MD, FCCP, FACP

## 2014-12-12 NOTE — Care Management Note (Signed)
    Page 1 of 1   12/12/2014     9:37:06 AM CARE MANAGEMENT NOTE 12/12/2014  Patient:  [name] [name], [name] [name]   Account Number:  [id number]  Date Initiated:  12/12/2014  Documentation initiated by:  [facility]  Subjective/Objective Assessment:   81 yo in today for the placement of a pleurx drain for effusion.     Action/Plan:   Active with [facility]. Per patient, they were just out a couple of days ago.   Anticipated DC Date:  12/12/2014   Anticipated DC Plan:  [place]         [facility] Choice  HOME HEALTH   Choice offered to / List presented to:  C-1 Patient        [place] arranged  HH-1 RN      [place].   Status of service:  Completed, signed off Medicare Important Message given?  NA - LOS <3 / Initial given by admissions (If response is "NO", the following Medicare IM given date fields will be blank) Date Medicare IM given:   Medicare IM given by:   Date Additional Medicare IM given:   Additional Medicare IM given by:    Discharge Disposition:  [place]  Per UR Regulation:    If discussed at Long Length of Stay Meetings, dates discussed:    Comments:  01.08.16 [facility] RNBSN ACM [phone] Spoke with [name] [name] at the bedside in PACU to confirm that he is active with [facility]. Cleon Dew at [phone] to notify her that the procedure was today. Faxed demos/orders/canister prescription to [facility] at [phone]. Order is to drain eod.

## 2014-12-12 NOTE — Anesthesia Preprocedure Evaluation (Addendum)
Anesthesia Evaluation  Patient identified by MRN, date of birth, ID band Patient awake    Reviewed: Allergy & Precautions, NPO status , Patient's Chart, lab work & pertinent test results  Airway Mallampati: II  TM Distance: >3 FB Neck ROM: Full    Dental  (+) Teeth Intact, Dental Advisory Given   Pulmonary sleep apnea , COPDformer smoker,          Cardiovascular + Peripheral Vascular Disease + dysrhythmias Atrial Fibrillation     Neuro/Psych  Headaches, CVA    GI/Hepatic hiatal hernia,   Endo/Other  diabetes, Type 2, Insulin DependentHypothyroidism   Renal/GU Renal disease     Musculoskeletal  (+) Arthritis -,   Abdominal   Peds  Hematology   Anesthesia Other Findings   Reproductive/Obstetrics                            Anesthesia Physical Anesthesia Plan  ASA: III  Anesthesia Plan: MAC   Post-op Pain Management:    Induction: Intravenous  Airway Management Planned: Simple Face Mask  Additional Equipment:   Intra-op Plan:   Post-operative Plan:   Informed Consent: I have reviewed the patients History and Physical, chart, labs and discussed the procedure including the risks, benefits and alternatives for the proposed anesthesia with the patient or authorized representative who has indicated his/her understanding and acceptance.   Dental advisory given  Plan Discussed with: CRNA, Anesthesiologist and Surgeon  Anesthesia Plan Comments:        Anesthesia Quick Evaluation

## 2014-12-12 NOTE — Anesthesia Procedure Notes (Signed)
Date/Time: 12/12/2014 7:30 AM Performed by: Carney Living Pre-anesthesia Checklist: Emergency Drugs available, Suction available, Patient being monitored, Patient identified and Timeout performed Patient Re-evaluated:Patient Re-evaluated prior to inductionOxygen Delivery Method: Simple face mask

## 2014-12-12 NOTE — Progress Notes (Signed)
The patient was examined and preop studies reviewed. There has been no change from the prior exam and the patient is ready for surgery.   Plan Right Pleurx on J Tubby today

## 2014-12-12 NOTE — Discharge Instructions (Signed)
I have notified [facility] of the catheter placement and the every other day drain order from Dr Prescott Gum. I have faxed the orders for canisters to [facility] at [phone]. The specialist line at [facility] is [phone]. Thank you for choosing [facility]! Deveron Furlong RN BSN ACM [phone]  What to eat:  For your first meals, you should eat lightly; only small meals initially.  If you do not have nausea, you may eat larger meals.  Avoid spicy, greasy and heavy food.    General Anesthesia, Adult, Care After  Refer to this sheet in the next few weeks. These instructions provide you with information on caring for yourself after your procedure. Your health care provider may also give you more specific instructions. Your treatment has been planned according to current medical practices, but problems sometimes occur. Call your health care provider if you have any problems or questions after your procedure.  WHAT TO EXPECT AFTER THE PROCEDURE  After the procedure, it is typical to experience:  Sleepiness.  Nausea and vomiting. HOME CARE INSTRUCTIONS  For the first 24 hours after general anesthesia:  Have a responsible person with you.  Do not drive a car. If you are alone, do not take public transportation.  Do not drink alcohol.  Do not take medicine that has not been prescribed by your health care provider.  Do not sign important papers or make important decisions.  You may resume a normal diet and activities as directed by your health care provider.  Change bandages (dressings) as directed.  If you have questions or problems that seem related to general anesthesia, call the hospital and ask for the anesthetist or anesthesiologist on call. SEEK MEDICAL CARE IF:  You have nausea and vomiting that continue the day after anesthesia.  You develop a rash. SEEK IMMEDIATE MEDICAL CARE IF:  You have difficulty breathing.  You have chest pain.  You have any allergic problems. Document  Released: 02/27/2001 Document Revised: 07/24/2013 Document Reviewed: 06/06/2013  [facility] Patient Information 2014 [place], [place].    Chest Tube A chest tube is a small, flexible drainage tube that is put into the chest. The tube drains fluid, blood, or extra air that has built up between the lungs and the inside of the chest wall (pleural space). Fluid or air can build up in this area for various reasons. When this occurs, it can prevent the lung from expanding completely and cause breathing problems. This can be dangerous. The chest tube allows the lung to re-expand. The procedure to put in the chest tube involves inserting the tube through the skin between the ribs and into the pleural space. LET [facility] CARE PROVIDER KNOW ABOUT:   Any allergies you have.  All medicines you are taking, including vitamins, herbs, eye drops, creams, and over-the-counter medicines.  Previous problems you or members of your family have had with the use of anesthetics.  Any blood disorders you have.  Previous surgeries you have had.  Medical conditions you have. RISKS AND COMPLICATIONS Generally, this is a safe procedure. However, as with any procedure, problems can occur. Possible problems include:  Bleeding.  Injury to the lung.  Infection.  Chest tube failing to work properly, usually due to leaking of air around the tube, or tube positioning in a place where all of the fluid or air cannot be drained.  Problems related to the use of anesthetics or pain medicines. BEFORE THE PROCEDURE  Ask your health care provider if there  are any special preparatory instructions such as not eating before the procedure. Follow these instructions exactly. PROCEDURE  The area where the chest tube will be inserted is numbed with a medicine (local anesthetic). You may be given medicine for pain and medicine to help you relax (sedative). An incision is made between the ribs, and a small opening is made through the  inner lining of the chest wall. The chest tube is inserted through this opening and into the pleural space. The other end of the chest tube may be connected to a plastic container that collects the fluid drained from the pleural space and has sterile water to make a one-way seal, or "water seal," that prevents air from going back into the pleural space. Suction is sometimes attached to the system for drainage. A stitch (suture) or tape is used to keep the tube in place.  AFTER THE PROCEDURE  A chest X-ray will be done to check the position of the chest tube. You will be monitored for breathing difficulties, air leaks in the chest tube, and the need for additional oxygen. You will be encouraged to breathe deeply. You may be given antibiotic medicine to prevent or treat infection. The chest tube will stay in place until all the extra air or fluid has drained from the chest. You will likely need to stay in the hospital until the chest tube is removed. In rare cases, you may go home with the chest tube in place. Document Released: 03/01/2007 Document Revised: 11/26/2013 Document Reviewed: 05/29/2013 [facility] Patient Information 2015 [place], [place]. This information is not intended to replace advice given to you by your health care provider. Make sure you discuss any questions you have with your health care provider.

## 2014-12-12 NOTE — Transfer of Care (Signed)
Immediate Anesthesia Transfer of Care Note  Patient: [name] [name]  Procedure(s) Performed: Procedure(s): INSERTION PLEURAL DRAINAGE CATHETER (Right)  Patient Location: PACU  Anesthesia Type:MAC  Level of Consciousness: awake, alert , oriented and patient cooperative  Airway & Oxygen Therapy: Patient Spontanous Breathing and Patient connected to nasal cannula oxygen  Post-op Assessment: Report given to PACU RN, Post -op Vital signs reviewed and stable and Patient moving all extremities X 4  Post vital signs: Reviewed and stable  Complications: No apparent anesthesia complications

## 2014-12-12 NOTE — Brief Op Note (Signed)
12/12/2014  8:18 AM  PATIENT:  [name] [name]  81 y.o. male  PRE-OPERATIVE DIAGNOSIS:  RIGHT PLEURAL EFFUSION  POST-OPERATIVE DIAGNOSIS:  RIGHT PLEURAL EFFUSION  PROCEDURE:  Procedure(s): INSERTION PLEURAL DRAINAGE CATHETER (Right)  SURGEON:  Surgeon(s) and Role:    * Ivin Poot, MD - Primary  PHYSICIAN ASSISTANT:   ASSISTANTS: none   ANESTHESIA:   local and IV sedation  EBL:  Total I/O In: 150 [I.V.:150] Out: -   BLOOD ADMINISTERED:none  DRAINS: Plerux catheter drain  LOCAL MEDICATIONS USED:  LIDOCAINE  and Amount: 8 ml  SPECIMEN:  Aspirate  Pleural fluid DISPOSITION OF SPECIMEN:  PATHOLOGY  COUNTS:  YES  TOURNIQUET:  * No tourniquets in log *  DICTATION: .Dragon Dictation  PLAN OF CARE: Discharge to home after PACU  PATIENT DISPOSITION:  PACU - hemodynamically stable.   Delay start of Pharmacological VTE agent (>24hrs) due to surgical blood loss or risk of bleeding: yes   Stop

## 2014-12-12 NOTE — Anesthesia Postprocedure Evaluation (Signed)
  Anesthesia Post-op Note  Patient: [name] [name]  Procedure(s) Performed: Procedure(s): INSERTION PLEURAL DRAINAGE CATHETER (Right)  Patient Location: PACU  Anesthesia Type:MAC  Level of Consciousness: awake, alert , oriented and patient cooperative  Airway and Oxygen Therapy: Patient Spontanous Breathing  Post-op Pain: mild  Post-op Assessment: Post-op Vital signs reviewed, Patient's Cardiovascular Status Stable, Respiratory Function Stable, Patent Airway, No signs of Nausea or vomiting and Pain level controlled  Post-op Vital Signs: stable  Last Vitals:  Filed Vitals:   12/12/14 0848  BP: 115/59  Pulse:   Temp:   Resp: 20    Complications: No apparent anesthesia complications

## 2014-12-15 ENCOUNTER — Encounter: Payer: Self-pay | Admitting: *Deleted

## 2014-12-15 ENCOUNTER — Ambulatory Visit (INDEPENDENT_AMBULATORY_CARE_PROVIDER_SITE_OTHER): Payer: Medicare Other | Admitting: Internal Medicine

## 2014-12-15 ENCOUNTER — Encounter (HOSPITAL_COMMUNITY): Payer: Self-pay | Admitting: Cardiothoracic Surgery

## 2014-12-15 VITALS — BP 120/70 | HR 80 | Temp 97.9°F | Resp 20 | Ht 72.0 in | Wt 219.0 lb

## 2014-12-15 DIAGNOSIS — N183 Chronic kidney disease, stage 3 unspecified: Secondary | ICD-10-CM

## 2014-12-15 DIAGNOSIS — J90 Pleural effusion, not elsewhere classified: Secondary | ICD-10-CM

## 2014-12-15 DIAGNOSIS — I50812 Chronic right heart failure: Secondary | ICD-10-CM

## 2014-12-15 DIAGNOSIS — I509 Heart failure, unspecified: Secondary | ICD-10-CM

## 2014-12-15 MED ORDER — TRAMADOL HCL 50 MG PO TABS: 50.0000 mg | ORAL_TABLET | Freq: Three times a day (TID) | ORAL | Status: DC | PRN

## 2014-12-15 NOTE — Progress Notes (Signed)
   Subjective:    Patient ID: [name] [name], male    DOB: [birth year], 81 y.o.   MRN: [id number]  HPI  Admit date: 11/29/2014 Discharge date: 11/30/2014  Time spent: 35 minutes  Recommendations for Outpatient Follow-up:  1. Patient with recurrent pleural effusions, cytology from thoracentesis taken on 11/14/2014 reported as benign. Please follow up on pleural fluid cultures and repeat cytology done on this hospitalization  2. Patient to follow up with his Pulmonologist in 1 week.  3. Follow up on PT/INR, had INR of 1.95 on 11/30/2014  Discharge Diagnoses:  Principal Problem:  Recurrent right pleural effusion Active Problems:  Shortness of breath  B12 deficiency  Atrial fibrillation  BPH (benign prostatic hyperplasia)  Parkinson's disease  COPD with emphysema, mild  CKD (chronic kidney disease) stage 3, GFR 30-59 ml/min  DATE OF PROCEDURE: 12/12/2014 DATE OF DISCHARGE:   OPERATIVE REPORT   OPERATION: Placement of right PleurX catheter.  PREOPERATIVE DIAGNOSIS: Recurrent nonspecific right pleural effusion.    Review of Systems  Constitutional: Positive for fatigue. Negative for fever, chills and appetite change.  HENT: Negative for congestion, dental problem, ear pain, hearing loss, sore throat, tinnitus, trouble swallowing and voice change.   Eyes: Negative for pain, discharge and visual disturbance.  Respiratory: Positive for shortness of breath. Negative for cough, chest tightness, wheezing and stridor.   Cardiovascular: Negative for chest pain, palpitations and leg swelling.  Gastrointestinal: Negative for nausea, vomiting, abdominal pain, diarrhea, constipation, blood in stool and abdominal distention.  Genitourinary: Negative for urgency, hematuria, flank pain, discharge, difficulty urinating and genital sores.  Musculoskeletal: Negative for myalgias, back pain, joint swelling, arthralgias, gait problem and neck  stiffness.  Skin: Negative for rash.  Neurological: Positive for weakness. Negative for dizziness, syncope, speech difficulty, numbness and headaches.  Hematological: Negative for adenopathy. Does not bruise/bleed easily.  Psychiatric/Behavioral: Negative for behavioral problems and dysphoric mood. The patient is not nervous/anxious.        Objective:   Physical Exam  Constitutional: He is oriented to person, place, and time. He appears well-developed.  Frail Appears chronically ill Blood pressure well controlled Afebrile O2 saturation 97%  HENT:  Head: Normocephalic.  Right Ear: External ear normal.  Left Ear: External ear normal.  Eyes: Conjunctivae and EOM are normal.  Neck: Normal range of motion.  Cardiovascular: Normal rate and normal heart sounds.   Pulmonary/Chest: Effort normal.  Left chest normal Rales and diminished breath sounds right lower hemithorax  Catheter noted.  Right chest wall  Abdominal: Bowel sounds are normal.  Musculoskeletal: Normal range of motion. He exhibits no edema or tenderness.  Neurological: He is alert and oriented to person, place, and time.  Skin: Rash noted.  Erythematous intergluteal rash  Psychiatric: He has a normal mood and affect. His behavior is normal.          Assessment & Plan:   S/p placement of PleurX catheter for recurrent R pleural effusion AFib- to resume coumadin today DM- stable; continue ame insulin dosing  ROV 4 weeks

## 2014-12-15 NOTE — Patient Instructions (Signed)
Limit your sodium (Salt) intake   return in one month for follow-up

## 2014-12-15 NOTE — Progress Notes (Signed)
Pre visit review using our clinic review tool, if applicable. No additional management support is needed unless otherwise documented below in the visit note. 

## 2014-12-20 ENCOUNTER — Other Ambulatory Visit: Payer: Self-pay | Admitting: Internal Medicine

## 2014-12-22 ENCOUNTER — Other Ambulatory Visit: Payer: Self-pay | Admitting: Cardiothoracic Surgery

## 2014-12-22 DIAGNOSIS — J90 Pleural effusion, not elsewhere classified: Secondary | ICD-10-CM

## 2014-12-24 ENCOUNTER — Ambulatory Visit (INDEPENDENT_AMBULATORY_CARE_PROVIDER_SITE_OTHER): Payer: Medicare Other | Admitting: Cardiothoracic Surgery

## 2014-12-24 ENCOUNTER — Ambulatory Visit
Admission: RE | Admit: 2014-12-24 | Discharge: 2014-12-24 | Disposition: A | Discharge status: Home / Self Care | Payer: Medicare Other | Source: Ambulatory Visit | Attending: Cardiothoracic Surgery | Admitting: Cardiothoracic Surgery

## 2014-12-24 ENCOUNTER — Encounter: Payer: Self-pay | Admitting: Cardiothoracic Surgery

## 2014-12-24 VITALS — BP 119/70 | HR 94 | Resp 20 | Ht 72.0 in | Wt 220.0 lb

## 2014-12-24 DIAGNOSIS — J90 Pleural effusion, not elsewhere classified: Secondary | ICD-10-CM

## 2014-12-24 DIAGNOSIS — J948 Other specified pleural conditions: Secondary | ICD-10-CM

## 2014-12-24 NOTE — Progress Notes (Signed)
PCP is Nyoka Cowden, MD Referring Provider is Marletta Lor, MD  Chief Complaint  Patient presents with  . Routine Post Op     f/i with CXR S/P Placement of right PleurX catheter 12/12/14 drainage amount today was 148ml's, currently draining every other day    HPI: First office visit after placement of Pleurx catheter for recurring right pleural effusion, benign by multiple cytology analysis. Patient has chronic renal failure creatinine 2.8 BUN of 90, Parkinson's disease, and has been a risk for fall. He has atrial fibrillation ands  on anticoagulation but with fall risk. He has diabetes, sleep apnea, and previous stroke.  Symptoms have improved since placement of Pleurx catheter. The catheters being drained 3 days a week. Last drainage was 200 cc. No fever or drainage. Sutures of the insertion site are removed in the office today.  Chest x-ray shows improved aeration of the right lung, basilar atelectasis with Pleurx catheter in good position Past Medical History  Diagnosis Date  . Family history of malignant neoplasm of gastrointestinal tract   . Unspecified constipation   . Abnormal involuntary movements(781.0)   . Vitamin B12 deficiency   . Claudication   . Hypertrophy of prostate with urinary obstruction and other lower urinary tract symptoms (LUTS)   . Obesity   . Mixed hyperlipidemia   . Atrial fibrillation     --myoview 07/2006: not gated. No ischemia or infarct  Diastolic HF-- Echo 06/5642 ER60% mild AI/MR. RV mild to moderately dilated/ dysfunction. ? restrictive CM . RVSP 36  . Bladder polyps   . Kidney stones   . Unspecified sleep apnea     NPSG 05/14/2007- AHI 80.7/ hr  . OSA (obstructive sleep apnea)     "tried mask; couldn't use it; so I quit" (11/12/2014)  . Hypothyroidism   . History of hiatal hernia   . Daily headache     "I've had headaches all my life; get them almost daily" (11/12/2014)  . Stroke ~ 1985    "simple stroke" denies residual on  11/12/2014  . Arthritis     "just about qwhere"   . Pleural effusion 11/12/2014 admission  . Dysrhythmia     Afib  . Shortness of breath dyspnea      with and without exertion  . Type II or unspecified type diabetes mellitus with neurological manifestations, not stated as uncontrolled     Past Surgical History  Procedure Laterality Date  . Appendectomy    . Kidney stone surgery      Kidney stone retrieval with uretal stent x 2   . Bladder polyps      many times  last time 12/15  . Transurethral resection of bladder    . Orif elbow fracture Left 1976  . Fracture surgery    . Excisional hemorrhoidectomy  1950's  . Ureteral stent placement  X 1  . Cystoscopy w/ stone manipulation  X 2  . Cataract extraction w/ intraocular lens  implant, bilateral Bilateral   . Eye surgery Bilateral     catarct  . Chest tube insertion Right 12/12/2014    Procedure: INSERTION PLEURAL DRAINAGE CATHETER;  Surgeon: Ivin Poot, MD;  Location: [facility] OR;  Service: Thoracic;  Laterality: Right;    Family History  Problem Relation Age of Onset  . Lung cancer Mother 49    nonsmoker  . Colon cancer Sister   . Prostate cancer Paternal Uncle   . Stroke Father   . Hip fracture Father   .  Healthy Son   . Healthy Daughter     Social History History  Substance Use Topics  . Smoking status: Former Smoker -- 1.00 packs/day for 35 years    Types: Cigarettes    Quit date: 12/05/1985  . Smokeless tobacco: Never Used  . Alcohol Use: No    Current Outpatient Prescriptions  Medication Sig Dispense Refill  . acetaminophen (TYLENOL) 500 MG tablet Take 500 mg by mouth every 6 (six) hours as needed for mild pain.    . carbidopa-levodopa (SINEMET IR) 25-100 MG per tablet Take 1 tablet by mouth 2 (two) times daily. 180 tablet 3  . Cyanocobalamin (VITAMIN B 12 PO) Take 1 tablet by mouth daily.    . diazepam (VALIUM) 2 MG tablet Take 1 tablet (2 mg total) by mouth 2 (two) times daily as needed for anxiety.  (Patient taking differently: Take 2 mg by mouth every evening. ) 60 tablet 5  . furosemide (LASIX) 20 MG tablet 1 tablet Monday, Wednesday, Friday only (Patient taking differently: Take 20 mg by mouth every Monday, Wednesday, and Friday. 1 tablet Monday, Wednesday, Friday only) 90 tablet 3  . gabapentin (NEURONTIN) 100 MG capsule Take 2 capsules (200 mg total) by mouth every evening. 1 at bedtime. (Patient taking differently: Take 200 mg by mouth every evening. ) 60 capsule 3  . glucose blood test strip 1 each by Other route daily as needed for other. 100 each 12  . insulin glargine (LANTUS) 100 UNIT/ML injection Inject 0.5 mLs (50 Units total) into the skin at bedtime. 10 mL 11  . Insulin Pen Needle (PEN NEEDLES 31GX5/16") 31G X 8 MM MISC 1 each by Does not apply route as directed. 100 each 1  . Lancets (ONETOUCH ULTRASOFT) lancets 1 each by Other route daily as needed for other. 100 each 12  . LANTUS SOLOSTAR 100 UNIT/ML Solostar Pen INJECT 70 UNITS UNDER THE SKIN EACH NIGHT AT BEDTIME AS DIRECTED 15 mL 2  . levothyroxine (SYNTHROID, LEVOTHROID) 100 MCG tablet Take 1 tablet (100 mcg total) by mouth daily. 90 tablet 0  . lovastatin (MEVACOR) 20 MG tablet TAKE 1 TABLET BY MOUTH DAILY (Patient taking differently: Take 20 mg by mouth every morning. TAKE 1 TABLET BY MOUTH DAILY) 30 tablet 5  . metoprolol (TOPROL-XL) 100 MG 24 hr tablet 1 half tablet daily (Patient taking differently: Take 50 mg by mouth every morning. 1 half tablet daily) 90 tablet 3  . polyethylene glycol (MIRALAX) powder Take 17 g by mouth at bedtime. For regularity    . sertraline (ZOLOFT) 25 MG tablet Take 1 tablet (25 mg total) by mouth daily. (Patient taking differently: Take 25 mg by mouth every morning. ) 30 tablet 11  . Tamsulosin HCl (FLOMAX) 0.4 MG CAPS Take 0.4 mg by mouth daily.     . traMADol (ULTRAM) 50 MG tablet Take 1 tablet (50 mg total) by mouth every 8 (eight) hours as needed. 60 tablet 0  . trospium (SANCTURA) 20 MG  tablet Take 20 mg by mouth 2 (two) times daily.    [name] warfarin (COUMADIN) 5 MG tablet Take as directed by anticoagulation clinic (Patient taking differently: Take 2.5-5 mg by mouth every evening. Take half of a tablet (2.5mg ) on Sundays, Tuesdays, and Fridays.  On all other days, take a whole tablet (5mg ).) 105 tablet 1   No current facility-administered medications for this visit.    Allergies  Allergen Reactions  . Penicillins Rash    Happened 60 years ago  Review of Systems  Improved breathing since placement of Pleurx catheter No falls No bleeding complications from Coumadin since hospitalization He denies ankle edema  BP 119/70 mmHg  Pulse 94  Resp 20  Ht 6' (1.829 m)  Wt 220 lb (99.791 kg)  BMI 29.83 kg/m2  SpO2 96% Physical Exam Chronically ill, appears very frail but responsive and appropriate Lungs clear bilaterally Heart rhythm irregular-atrial fibrillation Minimal pedal edema Abdomen soft No focal motor deficit, mild tremor and generalized weakness  Diagnostic Tests: Chest x-ray shows good drainage of the right pleural effusion with Pleurx catheter  Impression: Recurrent right pleural effusion probably related to renal failure but will proceed with CT scan of chest-without contrast-to visualize potential tumor,, with improved visualization now  the pleural effusion has been drained  Plan:return in 3 weeks for Pleurx catheter check and CT scan of chest without contrast Continue Pleurx drainage 3 days a week in order to dry up  the effusion and provide best chance of catheter removal in the future

## 2014-12-25 ENCOUNTER — Other Ambulatory Visit: Payer: Self-pay | Admitting: *Deleted

## 2014-12-25 ENCOUNTER — Ambulatory Visit: Payer: Medicare Other | Admitting: Internal Medicine

## 2014-12-29 ENCOUNTER — Other Ambulatory Visit: Payer: Self-pay | Admitting: *Deleted

## 2014-12-29 DIAGNOSIS — J90 Pleural effusion, not elsewhere classified: Secondary | ICD-10-CM

## 2014-12-30 ENCOUNTER — Ambulatory Visit: Payer: Medicare Other

## 2014-12-30 ENCOUNTER — Other Ambulatory Visit: Payer: Self-pay | Admitting: *Deleted

## 2014-12-30 DIAGNOSIS — Z5181 Encounter for therapeutic drug level monitoring: Secondary | ICD-10-CM

## 2014-12-30 DIAGNOSIS — R259 Unspecified abnormal involuntary movements: Secondary | ICD-10-CM

## 2014-12-30 LAB — POCT INR: INR: 2.8

## 2014-12-30 MED ORDER — SERTRALINE HCL 25 MG PO TABS: 25.0000 mg | ORAL_TABLET | Freq: Every day | ORAL | Status: AC

## 2014-12-30 NOTE — Progress Notes (Signed)
Almyra Free returned called and verified INR and current coumadin dose. Recommendations were given at this time.  Please see Anticoag Track for details.

## 2014-12-30 NOTE — Progress Notes (Signed)
Almyra Free from [facility] called to report pt's INR of 2.8.  Report was taken by scheduler.  Mcarthur Rossetti at [phone]. Left message for call back.

## 2015-01-01 ENCOUNTER — Institutional Professional Consult (permissible substitution): Payer: Medicare Other | Admitting: Internal Medicine

## 2015-01-07 ENCOUNTER — Telehealth: Payer: Self-pay | Admitting: Internal Medicine

## 2015-01-07 ENCOUNTER — Ambulatory Visit: Payer: Medicare Other | Admitting: Family

## 2015-01-07 ENCOUNTER — Encounter: Payer: Self-pay | Admitting: Neurology

## 2015-01-07 ENCOUNTER — Ambulatory Visit (INDEPENDENT_AMBULATORY_CARE_PROVIDER_SITE_OTHER): Payer: Medicare Other | Admitting: Neurology

## 2015-01-07 ENCOUNTER — Ambulatory Visit: Payer: Medicare Other | Admitting: Neurology

## 2015-01-07 VITALS — BP 132/70 | HR 100 | Ht 72.0 in | Wt 217.0 lb

## 2015-01-07 DIAGNOSIS — I482 Chronic atrial fibrillation, unspecified: Secondary | ICD-10-CM

## 2015-01-07 DIAGNOSIS — G20 Parkinson's disease: Secondary | ICD-10-CM

## 2015-01-07 DIAGNOSIS — Z5181 Encounter for therapeutic drug level monitoring: Secondary | ICD-10-CM

## 2015-01-07 DIAGNOSIS — E0842 Diabetes mellitus due to underlying condition with diabetic polyneuropathy: Secondary | ICD-10-CM

## 2015-01-07 LAB — POCT INR: INR: 1.6

## 2015-01-07 MED ORDER — CARBIDOPA-LEVODOPA 25-100 MG PO TABS: 1.0000 | ORAL_TABLET | Freq: Three times a day (TID) | ORAL | Status: AC

## 2015-01-07 NOTE — Telephone Encounter (Signed)
Pt saw dr patel this morning and pt is passing blood agian in urine. She advised pt to call dr Raliegh Ip.  Dr Posey Pronto was also going to call dr Jeralyn Ruths who took his polyps out each time.   Pt states dr Posey Pronto seemed very concerned and has got him concerned as well. Pt states bleeds when he urinates, this last time 2- 3 tablespoons of blood. Would like a cb w/ advice

## 2015-01-07 NOTE — Patient Instructions (Addendum)
1.  Continue gabapentin 100mg  twice daily 2.  Increase sinemet 25/100 to three times daily 3.  Recommended using rollator 4.  Discuss with your primary care doctor regarding possible discontinuation of warfarin given your high risk of falls  5.  OK to stop valium since  6.  Return to clinic in 54-months
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
## 2015-01-07 NOTE — Telephone Encounter (Signed)
Pt will have [facility] check his protime today

## 2015-01-07 NOTE — Telephone Encounter (Signed)
Please advise 

## 2015-01-07 NOTE — Telephone Encounter (Signed)
Discussed with Padonda due to Dr.K left for the day. Padonda said to bring pt in to have his Protime checked due to bleeding. Constance Holster calling pt and having him come in today for Padonda.

## 2015-01-07 NOTE — Patient Instructions (Signed)
Anticoagulation Dose Instructions as of 01/07/2015      Dorene Grebe Tue Wed Thu Fri Sat   New Dose 2.5 mg 5 mg 2.5 mg 5 mg 5 mg 2.5 mg 5 mg    Description        Almyra Free from [facility] was advised to have patient continue taking 1 tablet every day except 1/2 tablet on Sundays/Tuesdays/Fridays.  Recheck INR in 2 weeks.  Call and let urology know that hematuria is present.

## 2015-01-10 ENCOUNTER — Emergency Department (HOSPITAL_COMMUNITY)
Admission: EM | Admit: 2015-01-10 | Discharge: 2015-01-11 | Disposition: A | Discharge status: Home / Self Care | Payer: Medicare Other | Attending: Emergency Medicine | Admitting: Emergency Medicine

## 2015-01-10 ENCOUNTER — Encounter (HOSPITAL_COMMUNITY): Payer: Self-pay | Admitting: Emergency Medicine

## 2015-01-10 DIAGNOSIS — Z79899 Other long term (current) drug therapy: Secondary | ICD-10-CM | POA: Diagnosis not present

## 2015-01-10 DIAGNOSIS — E039 Hypothyroidism, unspecified: Secondary | ICD-10-CM | POA: Insufficient documentation

## 2015-01-10 DIAGNOSIS — Z87442 Personal history of urinary calculi: Secondary | ICD-10-CM | POA: Insufficient documentation

## 2015-01-10 DIAGNOSIS — Z8709 Personal history of other diseases of the respiratory system: Secondary | ICD-10-CM | POA: Diagnosis not present

## 2015-01-10 DIAGNOSIS — K5900 Constipation, unspecified: Secondary | ICD-10-CM | POA: Diagnosis not present

## 2015-01-10 DIAGNOSIS — Z8673 Personal history of transient ischemic attack (TIA), and cerebral infarction without residual deficits: Secondary | ICD-10-CM | POA: Insufficient documentation

## 2015-01-10 DIAGNOSIS — Z7901 Long term (current) use of anticoagulants: Secondary | ICD-10-CM | POA: Diagnosis not present

## 2015-01-10 DIAGNOSIS — Z8669 Personal history of other diseases of the nervous system and sense organs: Secondary | ICD-10-CM | POA: Insufficient documentation

## 2015-01-10 DIAGNOSIS — E1149 Type 2 diabetes mellitus with other diabetic neurological complication: Secondary | ICD-10-CM | POA: Diagnosis not present

## 2015-01-10 DIAGNOSIS — Z880 Allergy status to penicillin: Secondary | ICD-10-CM | POA: Diagnosis not present

## 2015-01-10 DIAGNOSIS — Z800 Family history of malignant neoplasm of digestive organs: Secondary | ICD-10-CM | POA: Insufficient documentation

## 2015-01-10 DIAGNOSIS — M1990 Unspecified osteoarthritis, unspecified site: Secondary | ICD-10-CM | POA: Diagnosis not present

## 2015-01-10 DIAGNOSIS — Z87448 Personal history of other diseases of urinary system: Secondary | ICD-10-CM | POA: Diagnosis not present

## 2015-01-10 DIAGNOSIS — Z87891 Personal history of nicotine dependence: Secondary | ICD-10-CM | POA: Insufficient documentation

## 2015-01-10 DIAGNOSIS — Z794 Long term (current) use of insulin: Secondary | ICD-10-CM | POA: Diagnosis not present

## 2015-01-10 DIAGNOSIS — R319 Hematuria, unspecified: Secondary | ICD-10-CM | POA: Insufficient documentation

## 2015-01-10 DIAGNOSIS — N3090 Cystitis, unspecified without hematuria: Secondary | ICD-10-CM | POA: Diagnosis not present

## 2015-01-10 DIAGNOSIS — E669 Obesity, unspecified: Secondary | ICD-10-CM | POA: Diagnosis not present

## 2015-01-10 DIAGNOSIS — Z862 Personal history of diseases of the blood and blood-forming organs and certain disorders involving the immune mechanism: Secondary | ICD-10-CM | POA: Insufficient documentation

## 2015-01-10 DIAGNOSIS — I4891 Unspecified atrial fibrillation: Secondary | ICD-10-CM | POA: Insufficient documentation

## 2015-01-10 LAB — CBC WITH DIFFERENTIAL/PLATELET
Basophils Absolute: 0 K/uL (ref 0.0–0.1)
Basophils Relative: 0 % (ref 0–1)
Eosinophils Absolute: 0.2 K/uL (ref 0.0–0.7)
Eosinophils Relative: 2 % (ref 0–5)
HCT: 31.8 % — ABNORMAL LOW (ref 39.0–52.0)
Hemoglobin: 9.9 g/dL — ABNORMAL LOW (ref 13.0–17.0)
Lymphocytes Relative: 11 % — ABNORMAL LOW (ref 12–46)
Lymphs Abs: 1 K/uL (ref 0.7–4.0)
MCH: 28.4 pg (ref 26.0–34.0)
MCHC: 31.1 g/dL (ref 30.0–36.0)
MCV: 91.1 fL (ref 78.0–100.0)
Monocytes Absolute: 0.9 K/uL (ref 0.1–1.0)
Monocytes Relative: 10 % (ref 3–12)
Neutro Abs: 6.4 K/uL (ref 1.7–7.7)
Neutrophils Relative %: 77 % (ref 43–77)
Platelets: 190 K/uL (ref 150–400)
RBC: 3.49 MIL/uL — ABNORMAL LOW (ref 4.22–5.81)
RDW: 14.6 % (ref 11.5–15.5)
WBC: 8.5 K/uL (ref 4.0–10.5)

## 2015-01-10 LAB — URINALYSIS, ROUTINE W REFLEX MICROSCOPIC
Bilirubin Urine: NEGATIVE
Glucose, UA: NEGATIVE mg/dL
KETONES UR: NEGATIVE mg/dL
Nitrite: POSITIVE — AB
Protein, ur: >300 mg/dL — AB
Specific Gravity, Urine: 1.015 (ref 1.005–1.030)
Urobilinogen, UA: 1 mg/dL (ref 0.0–1.0)
pH: 8 (ref 5.0–8.0)

## 2015-01-10 LAB — PROTIME-INR
INR: 1.63 — ABNORMAL HIGH (ref 0.00–1.49)
Prothrombin Time: 19.5 s — ABNORMAL HIGH (ref 11.6–15.2)

## 2015-01-10 MED ORDER — CIPROFLOXACIN HCL 500 MG PO TABS: 500.0000 mg | ORAL_TABLET | Freq: Two times a day (BID) | ORAL | Status: DC

## 2015-01-10 MED ORDER — CIPROFLOXACIN HCL 500 MG PO TABS
500.0000 mg | ORAL_TABLET | Freq: Once | ORAL | Status: AC
  Administered 2015-01-11: 500 mg via ORAL
  Filled 2015-01-10: qty 1

## 2015-01-10 MED ORDER — LIDOCAINE HCL 2 % EX GEL
CUTANEOUS | Status: AC
  Administered 2015-01-10: 10
  Filled 2015-01-10: qty 10

## 2015-01-10 MED ORDER — LIDOCAINE HCL 2 % EX GEL
1.0000 "application " | Freq: Once | CUTANEOUS | Status: AC
  Administered 2015-01-10: 1 via URETHRAL
  Filled 2015-01-10: qty 10

## 2015-01-10 NOTE — ED Notes (Signed)
Pt reports had surgery for polyp removal about 6 months ago and has had intermittent small amount of blood in urine. Today pt started having increased blood in urine to the point that he has to urinate every 5-10 minutes and it is "basically all blood along with clots of blood." Pt c/o pain to penis. Denies dizziness/ lightheadedness, "no worse than usual SOB."

## 2015-01-10 NOTE — ED Provider Notes (Signed)
CSN: [id number]     Arrival date & time 01/10/15  2040 History   First MD Initiated Contact with Patient 01/10/15 2122     Chief Complaint  Patient presents with  . Hematuria     (Consider location/radiation/quality/duration/timing/severity/associated sxs/prior Treatment) HPI Comments: Patient presents with hematuria. He has a history of a polyp removal about 6 months ago. He's had some intermittent episodes of hematuria since that time. Today started having gross hematuria with blood clots. He's having urinary urgency but is only peeing small amounts at a time. He has some pain to his penis. He has some mild pain across his lower abdomen. He denies any fevers. He denies any nausea or vomiting. He is on Coumadin for atrial fibrillation.  Patient is a 81 y.o. male presenting with hematuria.  Hematuria Associated symptoms include abdominal pain. Pertinent negatives include no chest pain, no headaches and no shortness of breath.    Past Medical History  Diagnosis Date  . Family history of malignant neoplasm of gastrointestinal tract   . Unspecified constipation   . Abnormal involuntary movements(781.0)   . Vitamin B12 deficiency   . Claudication   . Hypertrophy of prostate with urinary obstruction and other lower urinary tract symptoms (LUTS)   . Obesity   . Mixed hyperlipidemia   . Atrial fibrillation     --myoview 07/2006: not gated. No ischemia or infarct  Diastolic HF-- Echo 05/7618 ER60% mild AI/MR. RV mild to moderately dilated/ dysfunction. ? restrictive CM . RVSP 36  . Bladder polyps   . Kidney stones   . Unspecified sleep apnea     NPSG 05/14/2007- AHI 80.7/ hr  . OSA (obstructive sleep apnea)     "tried mask; couldn't use it; so I quit" (11/12/2014)  . Hypothyroidism   . History of hiatal hernia   . Daily headache     "I've had headaches all my life; get them almost daily" (11/12/2014)  . Stroke ~ 1985    "simple stroke" denies residual on 11/12/2014  . Arthritis     "just  about qwhere"   . Pleural effusion 11/12/2014 admission  . Dysrhythmia     Afib  . Shortness of breath dyspnea      with and without exertion  . Type II or unspecified type diabetes mellitus with neurological manifestations, not stated as uncontrolled    Past Surgical History  Procedure Laterality Date  . Appendectomy    . Kidney stone surgery      Kidney stone retrieval with uretal stent x 2   . Bladder polyps      many times  last time 12/15  . Transurethral resection of bladder    . Orif elbow fracture Left 1976  . Fracture surgery    . Excisional hemorrhoidectomy  1950's  . Ureteral stent placement  X 1  . Cystoscopy w/ stone manipulation  X 2  . Cataract extraction w/ intraocular lens  implant, bilateral Bilateral   . Eye surgery Bilateral     catarct  . Chest tube insertion Right 12/12/2014    Procedure: INSERTION PLEURAL DRAINAGE CATHETER;  Surgeon: Ivin Poot, MD;  Location: [facility] OR;  Service: Thoracic;  Laterality: Right;   Family History  Problem Relation Age of Onset  . Lung cancer Mother 57    nonsmoker  . Colon cancer Sister   . Prostate cancer Paternal Uncle   . Stroke Father   . Hip fracture Father   . Healthy Son   .  Healthy Daughter    History  Substance Use Topics  . Smoking status: Former Smoker -- 1.00 packs/day for 35 years    Types: Cigarettes    Quit date: 12/05/1985  . Smokeless tobacco: Never Used  . Alcohol Use: No    Review of Systems  Constitutional: Negative for fever, chills, diaphoresis and fatigue.  HENT: Negative for congestion, rhinorrhea and sneezing.   Eyes: Negative.   Respiratory: Negative for cough, chest tightness and shortness of breath.   Cardiovascular: Negative for chest pain and leg swelling.  Gastrointestinal: Positive for abdominal pain. Negative for nausea, vomiting, diarrhea and blood in stool.  Genitourinary: Positive for urgency, frequency and hematuria. Negative for flank pain and difficulty urinating.   Musculoskeletal: Negative for back pain and arthralgias.  Skin: Negative for rash.  Neurological: Negative for dizziness, speech difficulty, weakness, numbness and headaches.      Allergies  Penicillins  Home Medications   Prior to Admission medications   Medication Sig Start Date End Date Taking? Authorizing Provider  acetaminophen (TYLENOL) 500 MG tablet Take 500 mg by mouth every 6 (six) hours as needed for mild pain.   Yes Historical Provider, MD  carbidopa-levodopa (SINEMET IR) 25-100 MG per tablet Take 1 tablet by mouth 3 (three) times daily. 01/07/15  Yes Donika K Patel, DO  Cyanocobalamin (VITAMIN B 12 PO) Take 1 tablet by mouth daily.   Yes Historical Provider, MD  diazepam (VALIUM) 2 MG tablet Take 1 tablet (2 mg total) by mouth 2 (two) times daily as needed for anxiety. Patient taking differently: Take 2 mg by mouth every evening.  10/08/14  Yes Marletta Lor, MD  furosemide (LASIX) 20 MG tablet 1 tablet Monday, Wednesday, Friday only Patient taking differently: Take 20 mg by mouth every Monday, Wednesday, and Friday. 1 tablet Monday, Wednesday, Friday only 06/24/14  Yes Marletta Lor, MD  gabapentin (NEURONTIN) 100 MG capsule Take 2 capsules (200 mg total) by mouth every evening. 1 at bedtime. Patient taking differently: Take 200 mg by mouth every evening.  11/24/14  Yes Rowe Clack, MD  insulin glargine (LANTUS) 100 UNIT/ML injection Inject 0.5 mLs (50 Units total) into the skin at bedtime. 11/15/14  Yes Kelvin Cellar, MD  levothyroxine (SYNTHROID, LEVOTHROID) 100 MCG tablet Take 1 tablet (100 mcg total) by mouth daily. 11/27/14  Yes Laurey Morale, MD  lovastatin (MEVACOR) 20 MG tablet TAKE 1 TABLET BY MOUTH DAILY Patient taking differently: Take 20 mg by mouth every morning. TAKE 1 TABLET BY MOUTH DAILY 07/24/14  Yes Marletta Lor, MD  metoprolol (TOPROL-XL) 100 MG 24 hr tablet 1 half tablet daily Patient taking differently: Take 50 mg by mouth every  morning. 1 half tablet daily 06/24/14  Yes Marletta Lor, MD  Polyethyl Glycol-Propyl Glycol 0.4-0.3 % SOLN Apply 1 drop to eye daily as needed (dry eye).   Yes Historical Provider, MD  polyethylene glycol (MIRALAX) powder Take 17 g by mouth at bedtime. For regularity   Yes Historical Provider, MD  sertraline (ZOLOFT) 25 MG tablet Take 1 tablet (25 mg total) by mouth daily. 12/30/14  Yes Marletta Lor, MD  Tamsulosin HCl (FLOMAX) 0.4 MG CAPS Take 0.4 mg by mouth daily.    Yes Historical Provider, MD  traMADol (ULTRAM) 50 MG tablet Take 1 tablet (50 mg total) by mouth every 8 (eight) hours as needed. 12/15/14  Yes Marletta Lor, MD  trospium (SANCTURA) 20 MG tablet Take 20 mg by mouth 2 (two)  times daily.   Yes Historical Provider, MD  warfarin (COUMADIN) 5 MG tablet Take as directed by anticoagulation clinic Patient taking differently: Take 2.5-5 mg by mouth every evening. Take half of a tablet (2.5mg ) on Sundays, Tuesdays, and Fridays.  On all other days, take a whole tablet (5mg ). 09/29/14  Yes Timoteo Gaul, FNP  ciprofloxacin (CIPRO) 500 MG tablet Take 1 tablet (500 mg total) by mouth every 18 (eighteen) hours. One po bid x 7 days 01/11/15   Malvin Johns, MD  glucose blood test strip 1 each by Other route daily as needed for other. 07/10/14   Marletta Lor, MD  Insulin Pen Needle (PEN NEEDLES 31GX5/16") 31G X 8 MM MISC 1 each by Does not apply route as directed. 07/09/12   Neena Rhymes, MD  Lancets [facility] ULTRASOFT) lancets 1 each by Other route daily as needed for other. 07/10/14   Marletta Lor, MD  LANTUS SOLOSTAR 100 UNIT/ML Solostar Pen INJECT 70 UNITS UNDER THE SKIN EACH NIGHT AT BEDTIME AS DIRECTED Patient not taking: Reported on 01/10/2015 12/20/14   Marletta Lor, MD   BP 96/64 mmHg  Pulse 103  Resp 14  SpO2 94% Physical Exam  Constitutional: He is oriented to person, place, and time. He appears well-developed and well-nourished.  HENT:  Head:  Normocephalic and atraumatic.  Eyes: Pupils are equal, round, and reactive to light.  Neck: Normal range of motion. Neck supple.  Cardiovascular: Normal rate, regular rhythm and normal heart sounds.   Pulmonary/Chest: Effort normal and breath sounds normal. No respiratory distress. He has no wheezes. He has no rales. He exhibits no tenderness.  Abdominal: Soft. Bowel sounds are normal. There is tenderness (Mild tenderness the suprapubic area). There is no rebound and no guarding.  Genitourinary:  Normal circumcised male penis. There some blood clots the tip of the penis.  Musculoskeletal: Normal range of motion. He exhibits no edema.  Lymphadenopathy:    He has no cervical adenopathy.  Neurological: He is alert and oriented to person, place, and time.  Skin: Skin is warm and dry. No rash noted.  Psychiatric: He has a normal mood and affect.    ED Course  Procedures (including critical care time) Labs Review Labs Reviewed  CBC WITH DIFFERENTIAL/PLATELET - Abnormal; Notable for the following:    RBC 3.49 (*)    Hemoglobin 9.9 (*)    HCT 31.8 (*)    Lymphocytes Relative 11 (*)    All other components within normal limits  PROTIME-INR - Abnormal; Notable for the following:    Prothrombin Time 19.5 (*)    INR 1.63 (*)    All other components within normal limits  URINALYSIS, ROUTINE W REFLEX MICROSCOPIC - Abnormal; Notable for the following:    Color, Urine RED (*)    APPearance TURBID (*)    Hgb urine dipstick LARGE (*)    Protein, ur >300 (*)    Nitrite POSITIVE (*)    Leukocytes, UA LARGE (*)    All other components within normal limits  URINE MICROSCOPIC-ADD ON    Imaging Review No results found.   EKG Interpretation None      MDM   Final diagnoses:  Hematuria    Patient had a Foley catheter placed and continuous bladder irrigation was done. His urine was clear after this. He had no ongoing clots. He did have some intermittent pain to the tip of his penis and  Urojet was used for pain control. The Foley  appears to be draining normally. Silva Bandy is likely from some irritation to the tip of his penis. Foley appears to be well positioned. His pain is currently completely controlled. I spoke with Dr. Tresa Moore who is familiar with the patient and is comfortable with him being discharged home. His urine does have positive nitrate as well as leukocytes and I will treat him for infection with Cipro. His urine was sent for culture. His hemoglobin is dropped slightly from his prior values. I advised him to stop his Coumadin and contact his primary care provider to find out about restarting it. Apparently he's been falling a lot recently and there is been discussion about stopping the Coumadin permanently anyway. I gave him strict return precautions regarding returning if he has diminished urine output from the catheter, increased clots, abdominal pain, vomiting, fevers. Also encouraged him to drink lots of water to flush his bladder. He'll make a follow-up appointment with Dr. Risa Grill.    Malvin Johns, MD 01/11/15 [phone]

## 2015-01-10 NOTE — Discharge Instructions (Signed)
Hold your coumadin for the next 3 days.  Consult your doctor prior to restarting it.    Hematuria Hematuria is blood in your urine. It can be caused by a bladder infection, kidney infection, prostate infection, kidney stone, or cancer of your urinary tract. Infections can usually be treated with medicine, and a kidney stone usually will pass through your urine. If neither of these is the cause of your hematuria, further workup to find out the reason may be needed. It is very important that you tell your health care provider about any blood you see in your urine, even if the blood stops without treatment or happens without causing pain. Blood in your urine that happens and then stops and then happens again can be a symptom of a very serious condition. Also, pain is not a symptom in the initial stages of many urinary cancers. HOME CARE INSTRUCTIONS   Drink lots of fluid, 3-4 quarts a day. If you have been diagnosed with an infection, cranberry juice is especially recommended, in addition to large amounts of water.  Avoid caffeine, tea, and carbonated beverages because they tend to irritate the bladder.  Avoid alcohol because it may irritate the prostate.  Take all medicines as directed by your health care provider.  If you were prescribed an antibiotic medicine, finish it all even if you start to feel better.  If you have been diagnosed with a kidney stone, follow your health care provider's instructions regarding straining your urine to catch the stone.  Empty your bladder often. Avoid holding urine for long periods of time.  After a bowel movement, women should cleanse front to back. Use each tissue only once.  Empty your bladder before and after sexual intercourse if you are a male. SEEK MEDICAL CARE IF:  You develop back pain.  You have a fever.  You have a feeling of sickness in your stomach (nausea) or vomiting.  Your symptoms are not better in 3 days. Return sooner if you are  getting worse. SEEK IMMEDIATE MEDICAL CARE IF:   You develop severe vomiting and are unable to keep the medicine down.  You develop severe back or abdominal pain despite taking your medicines.  You begin passing a large amount of blood or clots in your urine.  You feel extremely weak or faint, or you pass out. MAKE SURE YOU:   Understand these instructions.  Will watch your condition.  Will get help right away if you are not doing well or get worse. Document Released: 11/21/2005 Document Revised: 04/07/2014 Document Reviewed: 07/22/2013 [facility] Patient Information 2015 [place], [place]. This information is not intended to replace advice given to you by your health care provider. Make sure you discuss any questions you have with your health care provider.

## 2015-01-11 ENCOUNTER — Encounter (HOSPITAL_COMMUNITY): Payer: Self-pay | Admitting: Emergency Medicine

## 2015-01-11 ENCOUNTER — Emergency Department (HOSPITAL_COMMUNITY)
Admission: EM | Admit: 2015-01-11 | Discharge: 2015-01-11 | Disposition: A | Discharge status: Home / Self Care | Payer: Medicare Other | Attending: Emergency Medicine | Admitting: Emergency Medicine

## 2015-01-11 DIAGNOSIS — Z880 Allergy status to penicillin: Secondary | ICD-10-CM | POA: Insufficient documentation

## 2015-01-11 DIAGNOSIS — Z794 Long term (current) use of insulin: Secondary | ICD-10-CM | POA: Insufficient documentation

## 2015-01-11 DIAGNOSIS — Z8669 Personal history of other diseases of the nervous system and sense organs: Secondary | ICD-10-CM | POA: Diagnosis not present

## 2015-01-11 DIAGNOSIS — E119 Type 2 diabetes mellitus without complications: Secondary | ICD-10-CM | POA: Insufficient documentation

## 2015-01-11 DIAGNOSIS — Z79899 Other long term (current) drug therapy: Secondary | ICD-10-CM | POA: Insufficient documentation

## 2015-01-11 DIAGNOSIS — Z862 Personal history of diseases of the blood and blood-forming organs and certain disorders involving the immune mechanism: Secondary | ICD-10-CM | POA: Insufficient documentation

## 2015-01-11 DIAGNOSIS — E782 Mixed hyperlipidemia: Secondary | ICD-10-CM | POA: Diagnosis not present

## 2015-01-11 DIAGNOSIS — I4891 Unspecified atrial fibrillation: Secondary | ICD-10-CM | POA: Diagnosis not present

## 2015-01-11 DIAGNOSIS — N3090 Cystitis, unspecified without hematuria: Secondary | ICD-10-CM | POA: Diagnosis not present

## 2015-01-11 DIAGNOSIS — N401 Enlarged prostate with lower urinary tract symptoms: Secondary | ICD-10-CM | POA: Diagnosis not present

## 2015-01-11 DIAGNOSIS — E669 Obesity, unspecified: Secondary | ICD-10-CM | POA: Insufficient documentation

## 2015-01-11 DIAGNOSIS — M1990 Unspecified osteoarthritis, unspecified site: Secondary | ICD-10-CM | POA: Diagnosis not present

## 2015-01-11 DIAGNOSIS — Z7901 Long term (current) use of anticoagulants: Secondary | ICD-10-CM | POA: Insufficient documentation

## 2015-01-11 DIAGNOSIS — R319 Hematuria, unspecified: Secondary | ICD-10-CM | POA: Diagnosis present

## 2015-01-11 DIAGNOSIS — E039 Hypothyroidism, unspecified: Secondary | ICD-10-CM | POA: Insufficient documentation

## 2015-01-11 DIAGNOSIS — Z87442 Personal history of urinary calculi: Secondary | ICD-10-CM | POA: Insufficient documentation

## 2015-01-11 DIAGNOSIS — Z8673 Personal history of transient ischemic attack (TIA), and cerebral infarction without residual deficits: Secondary | ICD-10-CM | POA: Insufficient documentation

## 2015-01-11 DIAGNOSIS — Z87891 Personal history of nicotine dependence: Secondary | ICD-10-CM | POA: Diagnosis not present

## 2015-01-11 DIAGNOSIS — K5900 Constipation, unspecified: Secondary | ICD-10-CM | POA: Insufficient documentation

## 2015-01-11 DIAGNOSIS — R310 Gross hematuria: Secondary | ICD-10-CM

## 2015-01-11 LAB — CBC
HCT: 30.5 % — ABNORMAL LOW (ref 39.0–52.0)
Hemoglobin: 9.5 g/dL — ABNORMAL LOW (ref 13.0–17.0)
MCH: 28.5 pg (ref 26.0–34.0)
MCHC: 31.1 g/dL (ref 30.0–36.0)
MCV: 91.6 fL (ref 78.0–100.0)
Platelets: 194 10*3/uL (ref 150–400)
RBC: 3.33 MIL/uL — ABNORMAL LOW (ref 4.22–5.81)
RDW: 14.7 % (ref 11.5–15.5)
WBC: 7.9 10*3/uL (ref 4.0–10.5)

## 2015-01-11 LAB — I-STAT CHEM 8, ED
BUN: 84 mg/dL — ABNORMAL HIGH (ref 6–23)
CREATININE: 2.8 mg/dL — AB (ref 0.50–1.35)
Calcium, Ion: 1.24 mmol/L (ref 1.13–1.30)
Chloride: 100 mmol/L (ref 96–112)
GLUCOSE: 147 mg/dL — AB (ref 70–99)
HCT: 30 % — ABNORMAL LOW (ref 39.0–52.0)
HEMOGLOBIN: 10.2 g/dL — AB (ref 13.0–17.0)
POTASSIUM: 4.4 mmol/L (ref 3.5–5.1)
Sodium: 140 mmol/L (ref 135–145)
TCO2: 24 mmol/L (ref 0–100)

## 2015-01-11 LAB — ABO/RH: ABO/RH(D): O POS

## 2015-01-11 LAB — URINE MICROSCOPIC-ADD ON

## 2015-01-11 LAB — TYPE AND SCREEN
ABO/RH(D): O POS
Antibody Screen: NEGATIVE

## 2015-01-11 MED ORDER — CIPROFLOXACIN HCL 500 MG PO TABS
500.0000 mg | ORAL_TABLET | Freq: Once | ORAL | Status: AC
  Administered 2015-01-11: 500 mg via ORAL
  Filled 2015-01-11: qty 1

## 2015-01-11 MED ORDER — TRAMADOL HCL 50 MG PO TABS: 50.0000 mg | ORAL_TABLET | Freq: Four times a day (QID) | ORAL | Status: DC | PRN

## 2015-01-11 MED ORDER — FENTANYL CITRATE 0.05 MG/ML IJ SOLN
50.0000 ug | Freq: Once | INTRAMUSCULAR | Status: AC
  Administered 2015-01-11: 50 ug via INTRAVENOUS
  Filled 2015-01-11: qty 2

## 2015-01-11 MED ORDER — CIPROFLOXACIN HCL 500 MG PO TABS: 500.0000 mg | ORAL_TABLET | ORAL | Status: DC

## 2015-01-11 MED ORDER — TRAMADOL HCL 50 MG PO TABS
50.0000 mg | ORAL_TABLET | Freq: Once | ORAL | Status: AC
  Administered 2015-01-11: 50 mg via ORAL
  Filled 2015-01-11: qty 1

## 2015-01-11 NOTE — ED Notes (Addendum)
Upon further assessment pt leg bag completely full; good output. Pt seen here yesterday for same.

## 2015-01-11 NOTE — Discharge Instructions (Signed)
Please start the ciprofloxacin tonight. Please see Dr. Tresa Moore tomorrow. Yesterday, you were also given a prescription for tramadol, which is a pain medicine. Return to the ER if there is intolerable pain.   Hematuria Hematuria is blood in your urine. It can be caused by a bladder infection, kidney infection, prostate infection, kidney stone, or cancer of your urinary tract. Infections can usually be treated with medicine, and a kidney stone usually will pass through your urine. If neither of these is the cause of your hematuria, further workup to find out the reason may be needed. It is very important that you tell your health care provider about any blood you see in your urine, even if the blood stops without treatment or happens without causing pain. Blood in your urine that happens and then stops and then happens again can be a symptom of a very serious condition. Also, pain is not a symptom in the initial stages of many urinary cancers. HOME CARE INSTRUCTIONS   Drink lots of fluid, 3-4 quarts a day. If you have been diagnosed with an infection, cranberry juice is especially recommended, in addition to large amounts of water.  Avoid caffeine, tea, and carbonated beverages because they tend to irritate the bladder.  Avoid alcohol because it may irritate the prostate.  Take all medicines as directed by your health care provider.  If you were prescribed an antibiotic medicine, finish it all even if you start to feel better.  If you have been diagnosed with a kidney stone, follow your health care provider's instructions regarding straining your urine to catch the stone.  Empty your bladder often. Avoid holding urine for long periods of time.  After a bowel movement, women should cleanse front to back. Use each tissue only once.  Empty your bladder before and after sexual intercourse if you are a male. SEEK MEDICAL CARE IF:  You develop back pain.  You have a fever.  You have a  feeling of sickness in your stomach (nausea) or vomiting.  Your symptoms are not better in 3 days. Return sooner if you are getting worse. SEEK IMMEDIATE MEDICAL CARE IF:   You develop severe vomiting and are unable to keep the medicine down.  You develop severe back or abdominal pain despite taking your medicines.  You begin passing a large amount of blood or clots in your urine.  You feel extremely weak or faint, or you pass out. MAKE SURE YOU:   Understand these instructions.  Will watch your condition.  Will get help right away if you are not doing well or get worse. Document Released: 11/21/2005 Document Revised: 04/07/2014 Document Reviewed: 07/22/2013 [facility] Patient Information 2015 [place], [place]. This information is not intended to replace advice given to you by your health care provider. Make sure you discuss any questions you have with your health care provider.  Urinary Tract Infection Urinary tract infections (UTIs) can develop anywhere along your urinary tract. Your urinary tract is your body's drainage system for removing wastes and extra water. Your urinary tract includes two kidneys, two ureters, a bladder, and a urethra. Your kidneys are a pair of bean-shaped organs. Each kidney is about the size of your fist. They are located below your ribs, one on each side of your spine. CAUSES Infections are caused by microbes, which are microscopic organisms, including fungi, viruses, and bacteria. These organisms are so small that they can only be seen through a microscope. Bacteria are the microbes that most commonly cause UTIs.  SYMPTOMS  Symptoms of UTIs may vary by age and gender of the patient and by the location of the infection. Symptoms in young women typically include a frequent and intense urge to urinate and a painful, burning feeling in the bladder or urethra during urination. Older women and men are more likely to be tired, shaky, and weak and have muscle aches and  abdominal pain. A fever may mean the infection is in your kidneys. Other symptoms of a kidney infection include pain in your back or sides below the ribs, nausea, and vomiting. DIAGNOSIS To diagnose a UTI, your caregiver will ask you about your symptoms. Your caregiver also will ask to provide a urine sample. The urine sample will be tested for bacteria and white blood cells. White blood cells are made by your body to help fight infection. TREATMENT  Typically, UTIs can be treated with medication. Because most UTIs are caused by a bacterial infection, they usually can be treated with the use of antibiotics. The choice of antibiotic and length of treatment depend on your symptoms and the type of bacteria causing your infection. HOME CARE INSTRUCTIONS  If you were prescribed antibiotics, take them exactly as your caregiver instructs you. Finish the medication even if you feel better after you have only taken some of the medication.  Drink enough water and fluids to keep your urine clear or pale yellow.  Avoid caffeine, tea, and carbonated beverages. They tend to irritate your bladder.  Empty your bladder often. Avoid holding urine for long periods of time.  Empty your bladder before and after sexual intercourse.  After a bowel movement, women should cleanse from front to back. Use each tissue only once. SEEK MEDICAL CARE IF:   You have back pain.  You develop a fever.  Your symptoms do not begin to resolve within 3 days. SEEK IMMEDIATE MEDICAL CARE IF:   You have severe back pain or lower abdominal pain.  You develop chills.  You have nausea or vomiting.  You have continued burning or discomfort with urination. MAKE SURE YOU:   Understand these instructions.  Will watch your condition.  Will get help right away if you are not doing well or get worse. Document Released: 08/31/2005 Document Revised: 05/22/2012 Document Reviewed: 12/30/2011 [facility] Patient Information 2015  [place], [place]. This information is not intended to replace advice given to you by your health care provider. Make sure you discuss any questions you have with your health care provider.

## 2015-01-11 NOTE — ED Notes (Signed)
Pt reports "parts from bladder" removed 6 months ago with short episodes of hematuria but then it goes away. Pt complaint of hematuria since Friday and dysuria.

## 2015-01-12 ENCOUNTER — Other Ambulatory Visit: Payer: Self-pay | Admitting: Urology

## 2015-01-12 ENCOUNTER — Other Ambulatory Visit: Payer: Self-pay | Admitting: Internal Medicine

## 2015-01-13 ENCOUNTER — Encounter (HOSPITAL_COMMUNITY)
Admission: RE | Admit: 2015-01-13 | Discharge: 2015-01-13 | Disposition: A | Discharge status: Home / Self Care | Payer: Medicare Other | Source: Ambulatory Visit | Attending: Urology | Admitting: Urology

## 2015-01-13 ENCOUNTER — Encounter (HOSPITAL_COMMUNITY): Payer: Self-pay

## 2015-01-13 DIAGNOSIS — E119 Type 2 diabetes mellitus without complications: Secondary | ICD-10-CM | POA: Diagnosis not present

## 2015-01-13 DIAGNOSIS — Z7901 Long term (current) use of anticoagulants: Secondary | ICD-10-CM | POA: Diagnosis not present

## 2015-01-13 DIAGNOSIS — I739 Peripheral vascular disease, unspecified: Secondary | ICD-10-CM | POA: Diagnosis not present

## 2015-01-13 DIAGNOSIS — E039 Hypothyroidism, unspecified: Secondary | ICD-10-CM | POA: Diagnosis not present

## 2015-01-13 DIAGNOSIS — R310 Gross hematuria: Secondary | ICD-10-CM | POA: Diagnosis present

## 2015-01-13 DIAGNOSIS — M1990 Unspecified osteoarthritis, unspecified site: Secondary | ICD-10-CM | POA: Diagnosis not present

## 2015-01-13 DIAGNOSIS — N189 Chronic kidney disease, unspecified: Secondary | ICD-10-CM | POA: Diagnosis not present

## 2015-01-13 DIAGNOSIS — Z87891 Personal history of nicotine dependence: Secondary | ICD-10-CM | POA: Diagnosis not present

## 2015-01-13 DIAGNOSIS — Z8551 Personal history of malignant neoplasm of bladder: Secondary | ICD-10-CM | POA: Diagnosis not present

## 2015-01-13 DIAGNOSIS — Z8673 Personal history of transient ischemic attack (TIA), and cerebral infarction without residual deficits: Secondary | ICD-10-CM | POA: Diagnosis not present

## 2015-01-13 DIAGNOSIS — Z79899 Other long term (current) drug therapy: Secondary | ICD-10-CM | POA: Diagnosis not present

## 2015-01-13 DIAGNOSIS — I4891 Unspecified atrial fibrillation: Secondary | ICD-10-CM | POA: Diagnosis not present

## 2015-01-13 DIAGNOSIS — G4730 Sleep apnea, unspecified: Secondary | ICD-10-CM | POA: Diagnosis not present

## 2015-01-13 DIAGNOSIS — J449 Chronic obstructive pulmonary disease, unspecified: Secondary | ICD-10-CM | POA: Diagnosis not present

## 2015-01-13 HISTORY — DX: Myoneural disorder, unspecified: G70.9

## 2015-01-13 LAB — BASIC METABOLIC PANEL
ANION GAP: 9 (ref 5–15)
BUN: 86 mg/dL — ABNORMAL HIGH (ref 6–23)
CHLORIDE: 104 mmol/L (ref 96–112)
CO2: 29 mmol/L (ref 19–32)
CREATININE: 3.13 mg/dL — AB (ref 0.50–1.35)
Calcium: 9.8 mg/dL (ref 8.4–10.5)
GFR calc Af Amer: 20 mL/min — ABNORMAL LOW (ref >90)
GFR calc non Af Amer: 17 mL/min — ABNORMAL LOW (ref >90)
Glucose, Bld: 144 mg/dL — ABNORMAL HIGH (ref 70–99)
Potassium: 4.9 mmol/L (ref 3.5–5.1)
Sodium: 142 mmol/L (ref 135–145)

## 2015-01-13 LAB — URINE CULTURE: Special Requests: NORMAL

## 2015-01-13 LAB — CBC
HCT: 31.6 % — ABNORMAL LOW (ref 39.0–52.0)
HEMOGLOBIN: 9.8 g/dL — AB (ref 13.0–17.0)
MCH: 28.7 pg (ref 26.0–34.0)
MCHC: 31 g/dL (ref 30.0–36.0)
MCV: 92.7 fL (ref 78.0–100.0)
Platelets: 239 10*3/uL (ref 150–400)
RBC: 3.41 MIL/uL — AB (ref 4.22–5.81)
RDW: 14.6 % (ref 11.5–15.5)
WBC: 8.9 10*3/uL (ref 4.0–10.5)

## 2015-01-13 NOTE — Patient Instructions (Addendum)
[name] [name]  01/13/2015   Your procedure is scheduled on:    Thursday January 15, 2015   Report to [facility] Main Entrance and follow signs to  [place] arrive at 9:30 AM.   Call this number if you have problems the morning of surgery [phone] or Presurgical Testing [phone].   Remember:  Do not eat food or drink liquids :After Midnight. EAT A GOOD HEALTHY SNACK NIGHT PRIOR TO SURGERY.             1/2 dose of insulin night before surgery  For Living Will and/or Health Care Power Attorney Forms: please provide copy for your medical record, may bring AM of surgery (forms should be already notarized-we do not provide this service).    Take these medicines the morning of surgery with A SIP OF WATER: Carbidopa-Levodopa;Levothyroxine;Metoprolol;Sertraline;Cipro;Gabapentin;Diazepam if needed.                               You may not have any metal on your body including hair pins and piercings  Do not wear jewelry, lotions, powders, cologne or deodorant.  Men may shave face and neck.               Do not bring valuables to the hospital. [place].    Patients discharged the day of surgery will not be allowed to drive home.  Name and phone number of your driver:Teri Leder (daughter)   ________________________________________________________________________  [facility] - Preparing for Surgery Before surgery, you can play an important role.  Because skin is not sterile, your skin needs to be as free of germs as possible.  You can reduce the number of germs on your skin by washing with CHG (chlorahexidine gluconate) soap before surgery.  CHG is an antiseptic cleaner which kills germs and bonds with the skin to continue killing germs even after washing. Please DO NOT use if you have an allergy to CHG or antibacterial soaps.  If your skin becomes reddened/irritated stop using the CHG and inform your nurse when you arrive at  Short Stay. Do not shave (including legs and underarms) for at least 48 hours prior to the first CHG shower.  You may shave your face/neck. Please follow these instructions carefully:  1.  Shower with CHG Soap the night before surgery and the  morning of Surgery.  2.  If you choose to wash your hair, wash your hair first as usual with your  normal  shampoo.  3.  After you shampoo, rinse your hair and body thoroughly to remove the  shampoo.                           4.  Use CHG as you would any other liquid soap.  You can apply chg directly  to the skin and wash                       Gently with a scrungie or clean washcloth.  5.  Apply the CHG Soap to your body ONLY FROM THE NECK DOWN.   Do not use on face/ open                           Wound or open sores. Avoid contact with eyes, ears mouth and genitals (  private parts).                       Wash face,  Genitals (private parts) with your normal soap.             6.  Wash thoroughly, paying special attention to the area where your surgery  will be performed.  7.  Thoroughly rinse your body with warm water from the neck down.  8.  DO NOT shower/wash with your normal soap after using and rinsing off  the CHG Soap.                9.  Pat yourself dry with a clean towel.            10.  Wear clean pajamas.            11.  Place clean sheets on your bed the night of your first shower and do not  sleep with pets. Day of Surgery : Do not apply any lotions/deodorants the morning of surgery.  Please wear clean clothes to the hospital/surgery center.  FAILURE TO FOLLOW THESE INSTRUCTIONS MAY RESULT IN THE CANCELLATION OF YOUR SURGERY PATIENT SIGNATURE_________________________________  NURSE SIGNATURE__________________________________  ________________________________________________________________________

## 2015-01-13 NOTE — Progress Notes (Addendum)
EKG per epic 11/30/2014 ECHO per epic 11/13/2014  Pt due to see Dr Nils Pyle and have a CT chest on 01/14/2015  Discussed H&P with anesthesia/Dr Marcell Barlow no orders given anesthesia will see pt day of surgery

## 2015-01-13 NOTE — ED Provider Notes (Signed)
CSN: [id number]     Arrival date & time 01/11/15  1018 History   First MD Initiated Contact with Patient 01/11/15 1059     Chief Complaint  Patient presents with  . Hematuria     (Consider location/radiation/quality/duration/timing/severity/associated sxs/prior Treatment) HPI Comments: Pt comes in with cc of blood per urine. Hx of afib, anticoagulation discontinued 2 days ago, hx of hematuria - seen in the ER yday and discharged with a foley catheter, and hx of polyps in the bladder. Pt was seen yday for the same and had foley catheter placed and bladder irrigated to clear. Pt return to the ER as the foley is filled with blood again. Daughter at bedside. There is no significant abd pain - like he was having yday. Dr. Tamera Punt had seen the patient, and spoken with Urology, who was going to see pt in the clinic on Monday. PT was started on cipro for uti. No n/v/f/c.  Patient is a 81 y.o. male presenting with hematuria. The history is provided by the patient, a relative and medical records.  Hematuria Pertinent negatives include no chest pain, no abdominal pain and no shortness of breath.    Past Medical History  Diagnosis Date  . Family history of malignant neoplasm of gastrointestinal tract   . Unspecified constipation   . Abnormal involuntary movements(781.0)   . Vitamin B12 deficiency   . Claudication   . Hypertrophy of prostate with urinary obstruction and other lower urinary tract symptoms (LUTS)   . Obesity   . Mixed hyperlipidemia   . Atrial fibrillation     --myoview 07/2006: not gated. No ischemia or infarct  Diastolic HF-- Echo 03/3328 ER60% mild AI/MR. RV mild to moderately dilated/ dysfunction. ? restrictive CM . RVSP 36  . Bladder polyps   . Kidney stones   . Unspecified sleep apnea     NPSG 05/14/2007- AHI 80.7/ hr  . OSA (obstructive sleep apnea)     "tried mask; couldn't use it; so I quit" (11/12/2014)  . Hypothyroidism   . History of hiatal hernia   . Daily headache      "I've had headaches all my life; get them almost daily" (11/12/2014)  . Stroke ~ 1985    "simple stroke" denies residual on 11/12/2014  . Arthritis     "just about qwhere"   . Pleural effusion 11/12/2014 admission  . Dysrhythmia     Afib  . Type II or unspecified type diabetes mellitus with neurological manifestations, not stated as uncontrolled   . Shortness of breath dyspnea     walking distance or climbing stairs  . Neuromuscular disorder     Parkinson's / age 50 had growth per left shoulder/neck area that stopped blood flow to left arm was removed and has not no further problems.   Past Surgical History  Procedure Laterality Date  . Appendectomy    . Kidney stone surgery      Kidney stone retrieval with uretal stent x 2   . Bladder polyps      many times  last time 12/15  . Transurethral resection of bladder    . Orif elbow fracture Left 1976  . Fracture surgery    . Excisional hemorrhoidectomy  1950's  . Ureteral stent placement  X 1  . Cystoscopy w/ stone manipulation  X 2  . Cataract extraction w/ intraocular lens  implant, bilateral Bilateral   . Eye surgery Bilateral     catarct  . Chest tube insertion Right 12/12/2014  Procedure: INSERTION PLEURAL DRAINAGE CATHETER;  Surgeon: Ivin Poot, MD;  Location: [facility] OR;  Service: Thoracic;  Laterality: Right;  . Colonscopy       removal of polyps   Family History  Problem Relation Age of Onset  . Lung cancer Mother 20    nonsmoker  . Colon cancer Sister   . Prostate cancer Paternal Uncle   . Stroke Father   . Hip fracture Father   . Healthy Son   . Healthy Daughter    History  Substance Use Topics  . Smoking status: Former Smoker -- 1.00 packs/day for 35 years    Types: Cigarettes    Quit date: 12/05/1985  . Smokeless tobacco: Never Used  . Alcohol Use: No    Review of Systems  Constitutional: Negative for activity change and appetite change.  Respiratory: Negative for cough and shortness of breath.    Cardiovascular: Negative for chest pain.  Gastrointestinal: Negative for abdominal pain.  Genitourinary: Positive for hematuria. Negative for dysuria.  All other systems reviewed and are negative.     Allergies  Penicillins  Home Medications   Prior to Admission medications   Medication Sig Start Date End Date Taking? Authorizing Provider  acetaminophen (TYLENOL) 500 MG tablet Take 500 mg by mouth every 6 (six) hours as needed for mild pain.    Historical Provider, MD  carbidopa-levodopa (SINEMET IR) 25-100 MG per tablet Take 1 tablet by mouth 3 (three) times daily. 01/07/15   Donika Keith Rake, DO  ciprofloxacin (CIPRO) 500 MG tablet Take 1 tablet (500 mg total) by mouth every 18 (eighteen) hours. One po bid x 7 days 01/11/15   Malvin Johns, MD  Cyanocobalamin (VITAMIN B 12 PO) Take 1 tablet by mouth daily.    Historical Provider, MD  diazepam (VALIUM) 2 MG tablet Take 1 tablet (2 mg total) by mouth 2 (two) times daily as needed for anxiety. 10/08/14   Marletta Lor, MD  furosemide (LASIX) 20 MG tablet 1 tablet Monday, Wednesday, Friday only Patient taking differently: Take 20 mg by mouth every Monday, Wednesday, and Friday. 1 tablet Monday, Wednesday, Friday only 06/24/14   Marletta Lor, MD  gabapentin (NEURONTIN) 100 MG capsule Take 2 capsules (200 mg total) by mouth every evening. 1 at bedtime. Patient taking differently: Take 100-200 mg by mouth 2 (two) times daily. 1 in am 1 in pm 11/24/14   Rowe Clack, MD  glucose blood test strip 1 each by Other route daily as needed for other. 07/10/14   Marletta Lor, MD  insulin glargine (LANTUS) 100 UNIT/ML injection Inject 0.5 mLs (50 Units total) into the skin at bedtime. 11/15/14   Kelvin Cellar, MD  Insulin Pen Needle (PEN NEEDLES 31GX5/16") 31G X 8 MM MISC 1 each by Does not apply route as directed. 07/09/12   Neena Rhymes, MD  Lancets [facility] ULTRASOFT) lancets 1 each by Other route daily as needed for other.  07/10/14   Marletta Lor, MD  LANTUS SOLOSTAR 100 UNIT/ML Solostar Pen INJECT 70 UNITS UNDER THE SKIN [facility] NIGHT AT BEDTIME AS DIRECTED Patient not taking: Reported on 01/13/2015 12/20/14   Marletta Lor, MD  levothyroxine (SYNTHROID, LEVOTHROID) 100 MCG tablet Take 1 tablet (100 mcg total) by mouth daily. 11/27/14   Laurey Morale, MD  lovastatin (MEVACOR) 20 MG tablet TAKE 1 TABLET BY MOUTH ONCE DAILY 01/12/15   Marletta Lor, MD  metoprolol (TOPROL-XL) 100 MG 24 hr tablet 1 half  tablet daily Patient taking differently: Take 50 mg by mouth every morning. 1 half tablet daily 06/24/14   Marletta Lor, MD  Polyethyl Glycol-Propyl Glycol 0.4-0.3 % SOLN Apply 1 drop to eye daily as needed (dry eye).    Historical Provider, MD  polyethylene glycol (MIRALAX) powder Take 17 g by mouth at bedtime. For regularity    Historical Provider, MD  sertraline (ZOLOFT) 25 MG tablet Take 1 tablet (25 mg total) by mouth daily. 12/30/14   Marletta Lor, MD  Tamsulosin HCl (FLOMAX) 0.4 MG CAPS Take 0.4 mg by mouth daily.     Historical Provider, MD  traMADol (ULTRAM) 50 MG tablet Take 1 tablet (50 mg total) by mouth every 6 (six) hours as needed. Patient taking differently: Take 50 mg by mouth every 6 (six) hours as needed for moderate pain.  01/11/15   Malvin Johns, MD  warfarin (COUMADIN) 5 MG tablet Take as directed by anticoagulation clinic Patient taking differently: Take 2.5-5 mg by mouth every evening. Take half of a tablet (2.5mg ) on Sundays, Tuesdays, and Fridays.  On all other days, take a whole tablet (5mg ). 09/29/14   Timoteo Gaul, FNP   BP 117/70 mmHg  Pulse 87  Temp(Src) 97.9 F (36.6 C) (Oral)  Resp 16  SpO2 100% Physical Exam  Constitutional: He is oriented to person, place, and time. He appears well-developed.  HENT:  Head: Normocephalic and atraumatic.  Eyes: Conjunctivae and EOM are normal. Pupils are equal, round, and reactive to light.  Neck: Normal range of  motion. Neck supple.  Cardiovascular: Normal rate and regular rhythm.   Pulmonary/Chest: Effort normal and breath sounds normal.  Abdominal: Soft. Bowel sounds are normal. He exhibits no distension. There is no tenderness. There is no rebound and no guarding.  Genitourinary:  Foley catheter with gross hematuria, no large clots.  Neurological: He is alert and oriented to person, place, and time.  Skin: Skin is warm.  Nursing note and vitals reviewed.   ED Course  Procedures (including critical care time) Labs Review Labs Reviewed  CBC - Abnormal; Notable for the following:    RBC 3.33 (*)    Hemoglobin 9.5 (*)    HCT 30.5 (*)    All other components within normal limits  I-STAT CHEM 8, ED - Abnormal; Notable for the following:    BUN 84 (*)    Creatinine, Ser 2.80 (*)    Glucose, Bld 147 (*)    Hemoglobin 10.2 (*)    HCT 30.0 (*)    All other components within normal limits  TYPE AND SCREEN  ABO/RH    Imaging Review No results found.   EKG Interpretation None      MDM   Final diagnoses:  Cystitis  Gross hematuria    PT comes in with cc of hematuria. He has hx of bladder surgery in the past, and has had hematuria in the past. Seen for the same yday, and sent home with cipro and foley catheter. Cipro was not started per family. Comes in with persistent hematuria. He is asymptomatic, and has mild abd pain. WE irrigated his bladder in the ER, and the return now is pink. Spoke with daughter. Hb is stable, agree that pt needs to see Urology for definitve care, and maybe a cystoscopy. Asked her to start the antibiotics, and f/u with Urology. Advised that pt doesn't need admission for this currently given all the findings we have. PT and daughter comfortable with the discharge idea.  Varney Biles, MD 01/13/15 1315

## 2015-01-13 NOTE — Progress Notes (Signed)
Cbc and bmet results sent to dr grapey by epic

## 2015-01-14 ENCOUNTER — Telehealth: Payer: Self-pay

## 2015-01-14 ENCOUNTER — Ambulatory Visit (INDEPENDENT_AMBULATORY_CARE_PROVIDER_SITE_OTHER): Payer: Medicare Other | Admitting: Cardiothoracic Surgery

## 2015-01-14 ENCOUNTER — Telehealth (HOSPITAL_BASED_OUTPATIENT_CLINIC_OR_DEPARTMENT_OTHER): Payer: Self-pay | Admitting: Emergency Medicine

## 2015-01-14 ENCOUNTER — Ambulatory Visit
Admission: RE | Admit: 2015-01-14 | Discharge: 2015-01-14 | Disposition: A | Discharge status: Home / Self Care | Payer: Medicare Other | Source: Ambulatory Visit | Attending: Cardiothoracic Surgery | Admitting: Cardiothoracic Surgery

## 2015-01-14 ENCOUNTER — Encounter: Payer: Self-pay | Admitting: Cardiothoracic Surgery

## 2015-01-14 VITALS — BP 136/70 | HR 100 | Resp 20 | Ht 72.0 in | Wt 218.0 lb

## 2015-01-14 DIAGNOSIS — J90 Pleural effusion, not elsewhere classified: Secondary | ICD-10-CM

## 2015-01-14 DIAGNOSIS — J948 Other specified pleural conditions: Secondary | ICD-10-CM

## 2015-01-14 NOTE — Telephone Encounter (Signed)
Post ED Visit - Positive Culture Follow-up  Culture report reviewed by antimicrobial stewardship pharmacist: []  Wes Crane, Pharm.D., BCPS [x]  Heide Guile, Pharm.D., BCPS []  Alycia Rossetti, Pharm.D., BCPS []  [place], Pharm.D., BCPS, AAHIVP []  Legrand Como, Pharm.D., BCPS, AAHIVP []  Isac Sarna, Pharm.D., BCPS  Positive urine culture Proteus Treated with ciprofloxacin, organism sensitive to the same and no further patient follow-up is required at this time.  Hazle Nordmann 01/14/2015, 4:43 PM

## 2015-01-14 NOTE — Progress Notes (Signed)
PCP is Nyoka Cowden, MD Referring Provider is Deneise Lever, MD  Chief Complaint  Patient presents with  . Follow-up    3 week f/u with Chest CT, currently draining Pleurx Cath every other day, averaging 90-150cc's  . Pleural Effusion    YBO:[id number] scheduled followup of right Pleurx catheter for recurrent right pleural effusion probably related to chronic right  Sided heart failure and  mild chronic renal insufficiency.  Patient initially had over a liter drained every other day by home health nurse. The drainage is decreased to approximately 200 cc and we will reduce the home drainage visits to twice weekly on Mondays and Thursdays.  No evidence of drainage around the catheter redness or evidence of infection.    There was concern over an underlying malignancy and a CT scan of the chest is performed today after the effusion has been cleared to fully assess the right lung.  the final report is still pending however the lung appears to be without any evidence of neoplasm and there is no evidence of abnormal mediastinal adenopathy.  The patient is undergoing transurethral bladder polyp resection tomorrow. He has stopped his Coumadin.  Past Medical History  Diagnosis Date  . Family history of malignant neoplasm of gastrointestinal tract   . Unspecified constipation   . Abnormal involuntary movements(781.0)   . Vitamin B12 deficiency   . Claudication   . Hypertrophy of prostate with urinary obstruction and other lower urinary tract symptoms (LUTS)   . Obesity   . Mixed hyperlipidemia   . Atrial fibrillation     --myoview 07/2006: not gated. No ischemia or infarct  Diastolic HF-- Echo 07/5276 ER60% mild AI/MR. RV mild to moderately dilated/ dysfunction. ? restrictive CM . RVSP 36  . Bladder polyps   . Kidney stones   . Unspecified sleep apnea     NPSG 05/14/2007- AHI 80.7/ hr  . OSA (obstructive sleep apnea)     "tried mask; couldn't use it; so I quit" (11/12/2014)  .  Hypothyroidism   . History of hiatal hernia   . Daily headache     "I've had headaches all my life; get them almost daily" (11/12/2014)  . Stroke ~ 1985    "simple stroke" denies residual on 11/12/2014  . Arthritis     "just about qwhere"   . Pleural effusion 11/12/2014 admission  . Dysrhythmia     Afib  . Type II or unspecified type diabetes mellitus with neurological manifestations, not stated as uncontrolled   . Shortness of breath dyspnea     walking distance or climbing stairs  . Neuromuscular disorder     Parkinson's / age 62 had growth per left shoulder/neck area that stopped blood flow to left arm was removed and has not no further problems.    Past Surgical History  Procedure Laterality Date  . Appendectomy    . Kidney stone surgery      Kidney stone retrieval with uretal stent x 2   . Bladder polyps      many times  last time 12/15  . Transurethral resection of bladder    . Orif elbow fracture Left 1976  . Fracture surgery    . Excisional hemorrhoidectomy  1950's  . Ureteral stent placement  X 1  . Cystoscopy w/ stone manipulation  X 2  . Cataract extraction w/ intraocular lens  implant, bilateral Bilateral   . Eye surgery Bilateral     catarct  . Chest tube insertion Right 12/12/2014  Procedure: INSERTION PLEURAL DRAINAGE CATHETER;  Surgeon: Ivin Poot, MD;  Location: [facility] OR;  Service: Thoracic;  Laterality: Right;  . Colonscopy       removal of polyps    Family History  Problem Relation Age of Onset  . Lung cancer Mother 68    nonsmoker  . Colon cancer Sister   . Prostate cancer Paternal Uncle   . Stroke Father   . Hip fracture Father   . Healthy Son   . Healthy Daughter     Social History History  Substance Use Topics  . Smoking status: Former Smoker -- 1.00 packs/day for 35 years    Types: Cigarettes    Quit date: 12/05/1985  . Smokeless tobacco: Never Used  . Alcohol Use: No    Current Outpatient Prescriptions  Medication Sig Dispense  Refill  . acetaminophen (TYLENOL) 500 MG tablet Take 500 mg by mouth every 6 (six) hours as needed for mild pain.    . carbidopa-levodopa (SINEMET IR) 25-100 MG per tablet Take 1 tablet by mouth 3 (three) times daily. 270 tablet 3  . ciprofloxacin (CIPRO) 500 MG tablet Take 1 tablet (500 mg total) by mouth every 18 (eighteen) hours. One po bid x 7 days 12 tablet 0  . Cyanocobalamin (VITAMIN B 12 PO) Take 1 tablet by mouth daily.    . diazepam (VALIUM) 2 MG tablet Take 1 tablet (2 mg total) by mouth 2 (two) times daily as needed for anxiety. 60 tablet 5  . furosemide (LASIX) 20 MG tablet 1 tablet Monday, Wednesday, Friday only (Patient taking differently: Take 20 mg by mouth every Monday, Wednesday, and Friday. 1 tablet Monday, Wednesday, Friday only) 90 tablet 3  . gabapentin (NEURONTIN) 100 MG capsule Take 2 capsules (200 mg total) by mouth every evening. 1 at bedtime. (Patient taking differently: Take 100-200 mg by mouth 2 (two) times daily. 1 in am 1 in pm) 60 capsule 3  . insulin glargine (LANTUS) 100 UNIT/ML injection Inject 0.5 mLs (50 Units total) into the skin at bedtime. 10 mL 11  . levothyroxine (SYNTHROID, LEVOTHROID) 100 MCG tablet Take 1 tablet (100 mcg total) by mouth daily. 90 tablet 0  . lovastatin (MEVACOR) 20 MG tablet TAKE 1 TABLET BY MOUTH ONCE DAILY 30 tablet 3  . metoprolol (TOPROL-XL) 100 MG 24 hr tablet 1 half tablet daily (Patient taking differently: Take 50 mg by mouth every morning. 1 half tablet daily) 90 tablet 3  . Polyethyl Glycol-Propyl Glycol 0.4-0.3 % SOLN Apply 1 drop to eye daily as needed (dry eye).    . polyethylene glycol (MIRALAX) powder Take 17 g by mouth at bedtime. For regularity    . sertraline (ZOLOFT) 25 MG tablet Take 1 tablet (25 mg total) by mouth daily. 30 tablet 11  . Tamsulosin HCl (FLOMAX) 0.4 MG CAPS Take 0.4 mg by mouth daily.     . traMADol (ULTRAM) 50 MG tablet Take 1 tablet (50 mg total) by mouth every 6 (six) hours as needed. (Patient taking  differently: Take 50 mg by mouth every 6 (six) hours as needed for moderate pain. ) 15 tablet 0  . warfarin (COUMADIN) 5 MG tablet Take as directed by anticoagulation clinic (Patient taking differently: Take 2.5-5 mg by mouth every evening. Take half of a tablet (2.5mg ) on Sundays, Tuesdays, and Fridays.  On all other days, take a whole tablet (5mg ).) 105 tablet 1  . glucose blood test strip 1 each by Other route daily as needed for  other. (Patient not taking: Reported on 01/14/2015) 100 each 12  . Insulin Pen Needle (PEN NEEDLES 31GX5/16") 31G X 8 MM MISC 1 each by Does not apply route as directed. (Patient not taking: Reported on 01/14/2015) 100 each 1  . Lancets (ONETOUCH ULTRASOFT) lancets 1 each by Other route daily as needed for other. (Patient not taking: Reported on 01/14/2015) 100 each 12   No current facility-administered medications for this visit.    Allergies  Allergen Reactions  . Penicillins Rash    Happened 60 years ago    Review of Systems   No abdominal pain No shortness of breath No falls since discharge home or evidence of stroke No fever or night sweats or change in weight Patient is having frequent episodes of bladder spasm and clots in his urine and has an indwelling Foley catheter No neurologic changes since discharge  BP 136/70 mmHg  Pulse 100  Resp 20  Ht 6' (1.829 m)  Wt 218 lb (98.884 kg)  BMI 29.56 kg/m2  SpO2 98% Physical Exam Alert and comfortable accompanied by wife, elderly frail gentleman sitting in a wheelchair Lungs clear bilaterally Right Pleurx catheter dressing dry and clean Heart rhythm irregular without murmur Abdomen soft nontender Extremities with 1+ pedal  Edema No focal motor deficit patient generally weak  Diagnostic Tests: Chest CT scan reviewed personally show no evidence of right lung neoplasm or abnormal mediastinal adenopathy. Pleurx catheter in good position. Minimal right pleural effusion noted.  Impression: Pleural  effusion drainage has significantly diminished. Will reduce the drainage schedule to twice weekly in hopes of removing the Pleurx catheter later this spring.  Plan:return for followup in 4-6 weeks.

## 2015-01-14 NOTE — Telephone Encounter (Signed)
Drain PleurX starting next week 01/19/15 every Monday and Thursdays.He is scheduled to see Dr Prescott Gum again in 5 weeks.

## 2015-01-15 ENCOUNTER — Ambulatory Visit (HOSPITAL_COMMUNITY): Payer: Medicare Other | Admitting: Anesthesiology

## 2015-01-15 ENCOUNTER — Encounter (HOSPITAL_COMMUNITY)
Admission: RE | Disposition: A | Discharge status: Home / Self Care | Payer: Self-pay | Source: Ambulatory Visit | Attending: Urology

## 2015-01-15 ENCOUNTER — Ambulatory Visit (HOSPITAL_COMMUNITY)
Admission: RE | Admit: 2015-01-15 | Discharge: 2015-01-15 | Disposition: A | Discharge status: Home / Self Care | Payer: Medicare Other | Source: Ambulatory Visit | Attending: Urology | Admitting: Urology

## 2015-01-15 ENCOUNTER — Encounter (HOSPITAL_COMMUNITY): Payer: Self-pay

## 2015-01-15 DIAGNOSIS — N189 Chronic kidney disease, unspecified: Secondary | ICD-10-CM | POA: Diagnosis not present

## 2015-01-15 DIAGNOSIS — Z7901 Long term (current) use of anticoagulants: Secondary | ICD-10-CM | POA: Insufficient documentation

## 2015-01-15 DIAGNOSIS — E039 Hypothyroidism, unspecified: Secondary | ICD-10-CM | POA: Insufficient documentation

## 2015-01-15 DIAGNOSIS — G4730 Sleep apnea, unspecified: Secondary | ICD-10-CM | POA: Insufficient documentation

## 2015-01-15 DIAGNOSIS — I4891 Unspecified atrial fibrillation: Secondary | ICD-10-CM | POA: Insufficient documentation

## 2015-01-15 DIAGNOSIS — Z8551 Personal history of malignant neoplasm of bladder: Secondary | ICD-10-CM | POA: Insufficient documentation

## 2015-01-15 DIAGNOSIS — R310 Gross hematuria: Secondary | ICD-10-CM | POA: Diagnosis not present

## 2015-01-15 DIAGNOSIS — I739 Peripheral vascular disease, unspecified: Secondary | ICD-10-CM | POA: Insufficient documentation

## 2015-01-15 DIAGNOSIS — E119 Type 2 diabetes mellitus without complications: Secondary | ICD-10-CM | POA: Insufficient documentation

## 2015-01-15 DIAGNOSIS — C679 Malignant neoplasm of bladder, unspecified: Secondary | ICD-10-CM

## 2015-01-15 DIAGNOSIS — M1990 Unspecified osteoarthritis, unspecified site: Secondary | ICD-10-CM | POA: Insufficient documentation

## 2015-01-15 DIAGNOSIS — Z79899 Other long term (current) drug therapy: Secondary | ICD-10-CM | POA: Insufficient documentation

## 2015-01-15 DIAGNOSIS — Z87891 Personal history of nicotine dependence: Secondary | ICD-10-CM | POA: Insufficient documentation

## 2015-01-15 DIAGNOSIS — J449 Chronic obstructive pulmonary disease, unspecified: Secondary | ICD-10-CM | POA: Insufficient documentation

## 2015-01-15 DIAGNOSIS — Z8673 Personal history of transient ischemic attack (TIA), and cerebral infarction without residual deficits: Secondary | ICD-10-CM | POA: Insufficient documentation

## 2015-01-15 HISTORY — PX: CYSTOSCOPY: SHX5120

## 2015-01-15 HISTORY — PX: TRANSURETHRAL RESECTION OF BLADDER TUMOR: SHX2575

## 2015-01-15 LAB — PROTIME-INR
INR: 1.4 (ref 0.00–1.49)
Prothrombin Time: 17.3 seconds — ABNORMAL HIGH (ref 11.6–15.2)

## 2015-01-15 LAB — GLUCOSE, CAPILLARY
GLUCOSE-CAPILLARY: 113 mg/dL — AB (ref 70–99)
Glucose-Capillary: 97 mg/dL (ref 70–99)
Glucose-Capillary: 102 mg/dL — ABNORMAL HIGH (ref 70–99)

## 2015-01-15 LAB — APTT: aPTT: 34 seconds (ref 24–37)

## 2015-01-15 SURGERY — CYSTOSCOPY
Anesthesia: General

## 2015-01-15 MED ORDER — LIDOCAINE HCL (CARDIAC) 20 MG/ML IV SOLN
INTRAVENOUS | Status: AC
  Filled 2015-01-15: qty 5

## 2015-01-15 MED ORDER — PROPOFOL 10 MG/ML IV BOLUS
INTRAVENOUS | Status: AC
  Filled 2015-01-15: qty 20

## 2015-01-15 MED ORDER — SODIUM CHLORIDE 0.9 % IV SOLN
INTRAVENOUS | Status: DC | PRN
  Administered 2015-01-15: 12:00:00 via INTRAVENOUS

## 2015-01-15 MED ORDER — PROPOFOL 10 MG/ML IV BOLUS
INTRAVENOUS | Status: DC | PRN
  Administered 2015-01-15: 30 mg via INTRAVENOUS
  Administered 2015-01-15: 70 mg via INTRAVENOUS

## 2015-01-15 MED ORDER — LEVOFLOXACIN IN D5W 500 MG/100ML IV SOLN
500.0000 mg | INTRAVENOUS | Status: AC
  Administered 2015-01-15: 500 mg via INTRAVENOUS
  Filled 2015-01-15: qty 100

## 2015-01-15 MED ORDER — FENTANYL CITRATE 0.05 MG/ML IJ SOLN
INTRAMUSCULAR | Status: AC
  Filled 2015-01-15: qty 2

## 2015-01-15 MED ORDER — LIDOCAINE HCL 2 % EX GEL
CUTANEOUS | Status: DC | PRN
  Administered 2015-01-15: 1 via URETHRAL

## 2015-01-15 MED ORDER — FENTANYL CITRATE 0.05 MG/ML IJ SOLN
INTRAMUSCULAR | Status: DC | PRN
  Administered 2015-01-15 (×2): 25 ug via INTRAVENOUS

## 2015-01-15 MED ORDER — ESMOLOL HCL 10 MG/ML IV SOLN
INTRAVENOUS | Status: DC | PRN
  Administered 2015-01-15: 20 mg via INTRAVENOUS

## 2015-01-15 MED ORDER — EPINEPHRINE HCL 1 MG/ML IJ SOLN
INTRAMUSCULAR | Status: AC
  Filled 2015-01-15: qty 1

## 2015-01-15 MED ORDER — SODIUM CHLORIDE 0.9 % IJ SOLN
INTRAMUSCULAR | Status: AC
  Filled 2015-01-15: qty 20

## 2015-01-15 MED ORDER — PHENYLEPHRINE HCL 10 MG/ML IJ SOLN
INTRAMUSCULAR | Status: AC
  Filled 2015-01-15: qty 1

## 2015-01-15 MED ORDER — LIDOCAINE HCL (CARDIAC) 20 MG/ML IV SOLN
INTRAVENOUS | Status: DC | PRN
  Administered 2015-01-15: 100 mg via INTRAVENOUS

## 2015-01-15 MED ORDER — FENTANYL CITRATE 0.05 MG/ML IJ SOLN
25.0000 ug | INTRAMUSCULAR | Status: DC | PRN
Start: 2015-01-15 — End: 2015-01-15
  Administered 2015-01-15 (×3): 25 ug via INTRAVENOUS

## 2015-01-15 MED ORDER — BELLADONNA ALKALOIDS-OPIUM 16.2-60 MG RE SUPP
RECTAL | Status: AC
  Filled 2015-01-15: qty 1

## 2015-01-15 MED ORDER — SODIUM CHLORIDE 0.9 % IV SOLN
10.0000 mg | INTRAVENOUS | Status: DC | PRN
  Administered 2015-01-15: 40 ug/min via INTRAVENOUS

## 2015-01-15 MED ORDER — ONDANSETRON HCL 4 MG/2ML IJ SOLN: 4.0000 mg | Freq: Once | INTRAMUSCULAR | Status: DC | PRN

## 2015-01-15 MED ORDER — ONDANSETRON HCL 4 MG/2ML IJ SOLN
INTRAMUSCULAR | Status: AC
  Filled 2015-01-15: qty 2

## 2015-01-15 MED ORDER — LIDOCAINE HCL 2 % EX GEL
CUTANEOUS | Status: AC
  Filled 2015-01-15: qty 10

## 2015-01-15 MED ORDER — LACTATED RINGERS IV SOLN
INTRAVENOUS | Status: DC
  Administered 2015-01-15: 1000 mL via INTRAVENOUS

## 2015-01-15 MED ORDER — SODIUM CHLORIDE 0.9 % IR SOLN
Status: DC | PRN
  Administered 2015-01-15: 6000 mL

## 2015-01-15 SURGICAL SUPPLY — 18 items
BAG URINE DRAINAGE (UROLOGICAL SUPPLIES) ×2 IMPLANT
BAG URO CATCHER STRL LF (DRAPE) ×3 IMPLANT
BLADE SURG 15 STRL LF DISP TIS (BLADE) IMPLANT
BLADE SURG 15 STRL SS (BLADE)
ELECT REM PT RETURN 9FT ADLT (ELECTROSURGICAL)
ELECTRODE REM PT RTRN 9FT ADLT (ELECTROSURGICAL) ×1 IMPLANT
GLOVE BIOGEL M STRL SZ7.5 (GLOVE) ×15 IMPLANT
GOWN STRL REUS W/TWL LRG LVL3 (GOWN DISPOSABLE) ×7 IMPLANT
GOWN STRL REUS W/TWL XL LVL3 (GOWN DISPOSABLE) ×3 IMPLANT
HOLDER FOLEY CATH W/STRAP (MISCELLANEOUS) IMPLANT
KIT ASPIRATION TUBING (SET/KITS/TRAYS/PACK) ×1 IMPLANT
LOOP CUT BIPOLAR 24F LRG (ELECTROSURGICAL) ×2 IMPLANT
MANIFOLD NEPTUNE II (INSTRUMENTS) ×3 IMPLANT
NS IRRIG 1000ML POUR BTL (IV SOLUTION) ×1 IMPLANT
PACK CYSTO (CUSTOM PROCEDURE TRAY) ×3 IMPLANT
SYRINGE IRR TOOMEY STRL 70CC (SYRINGE) ×3 IMPLANT
TUBING CONNECTING 10 (TUBING) ×2 IMPLANT
TUBING CONNECTING 10' (TUBING) ×1

## 2015-01-15 NOTE — Transfer of Care (Signed)
Immediate Anesthesia Transfer of Care Note  Patient: Casten Floren Van  Procedure(s) Performed: Procedure(s): CYSTOSCOPY (N/A) TRANSURETHRAL RESECTION OF BLADDER TUMOR (TURBT) (N/A)  Patient Location: PACU  Anesthesia Type:General  Level of Consciousness: awake, oriented, patient cooperative, lethargic and responds to stimulation  Airway & Oxygen Therapy: Patient Spontanous Breathing and Patient connected to face mask oxygen  Post-op Assessment: Report given to RN, Post -op Vital signs reviewed and stable and Patient moving all extremities  Post vital signs: Reviewed and stable  Last Vitals:  Filed Vitals:   01/15/15 1256  BP: 104/67  Pulse: 112  Temp:   Resp: 24    Complications: No apparent anesthesia complications

## 2015-01-15 NOTE — Interval H&P Note (Signed)
History and Physical Interval Note:  01/15/2015 11:43 AM  [name] [name]  has presented today for surgery, with the diagnosis of GROSS HEMATURIA, BLADDER CANCER  The various methods of treatment have been discussed with the patient and family. After consideration of risks, benefits and other options for treatment, the patient has consented to  Procedure(s): CYSTOSCOPY (N/A) TRANSURETHRAL RESECTION OF BLADDER TUMOR (TURBT) (N/A) as a surgical intervention .  The patient's history has been reviewed, patient examined, no change in status, stable for surgery.  I have reviewed the patient's chart and labs.  Questions were answered to the patient's satisfaction.     Keiji Melland S

## 2015-01-15 NOTE — Op Note (Signed)
Preoperative diagnosis: Gross hematuria, history of transitional cell carcinoma the bladder Postoperative diagnosis: Same  Procedure: Gyrus TURBT   Surgeon: Bernestine Amass M.D.  Anesthesia: Gen.  Indications: Mr. [name] [name] is 81 years of age. He has a long-standing history of recurrent transitional cell carcinoma along with BPH and long-standing voiding symptoms. Recently he has had issues with gross hematuria. He is on chronic anticoagulation. He's been seen and evaluated in the hospital on several occasions as well as in our office. He is suspected of having recurrent tumor. He currently has a 3 way indwelling Foley catheter which is been draining a light tea-colored urine. He presents now for further assessment of his bladder with resection and/or fulguration of any recurrent tumor.     Technique and findings: Patient was brought to the operating room where he had successful induction of general anesthesia. He was placed in lithotomy position and prepped and draped in usual manner. He received perioperative antibiotics and placement of PAS compression boots. Appropriate surgical timeout was performed. A 28 French continuous flow gyrus resectoscope was inserted. Saline was used as an irrigant. The bladder itself showed some edematous and erythematous mucosa especially on the bladder base and posterior wall consistent with his chronic indwelling Foley catheter. Those changes appeared inflammatory. There was no evidence of active bleeding. Anteriorly there was a 3 cm nodular-appearing tumor right next to the bladder neck. This tumor was then resected down to bladder muscle. No evidence of bladder perforation. Tumor chips were removed for pathologic analysis. A new Foley catheter was inserted with drainage of light pink urine. No obvious complications occurred.

## 2015-01-15 NOTE — Anesthesia Procedure Notes (Signed)
Procedure Name: LMA Insertion Date/Time: 01/15/2015 11:55 AM Performed by: Ofilia Neas Pre-anesthesia Checklist: Patient identified, Emergency Drugs available, Suction available, Patient being monitored and Timeout performed Patient Re-evaluated:Patient Re-evaluated prior to inductionOxygen Delivery Method: Circle system utilized Preoxygenation: Pre-oxygenation with 100% oxygen Intubation Type: IV induction Ventilation: Mask ventilation without difficulty LMA: LMA inserted LMA Size: 4.0 Number of attempts: 2 Tube secured with: Tape Dental Injury: Teeth and Oropharynx as per pre-operative assessment  Comments: Positioned to comfort with head elevated within anatomical limits

## 2015-01-15 NOTE — H&P (Signed)
History of Present Illness Patient returns for gross hematuria. He has a history of low-grade TA bladder cancer with multiple recurrences. He underwent office fulguration 2 months ago for multiple papillary recurrences. He was seen about 11 days ago with gross hematuria and a catheter placed. Last upper tract imaging was December 2015 abdominal ultrasound which showed normal kidneys. I reviewed all the images. I reviewed the office notes.    Patient went to the emergency department yesterday with gross hematuria. His H&H was 9.5 and 30.5 down from hemoglobin of about 11 two months ago. His BUN was 84 creatinine 2.8. Baseline creatinine recently around 2.3. Urine culture pending.    He has several competing medical issues including recurrent pleural effusion recently taken to the operating room January 2016 for a right Pleurx catheter. He also has some chronic renal insufficiency and Parkinson's.    Today, he has a 22 [place] 3-way catheter. He stopped his Coumadin Friday. Urine much clearer today. He feels well and has no complaints.    I irrigated the Foley catheter and there were no clots. Irrigation was clear. Catheter irrigated normally.   Past Medical History Problems  1. History of Arthritis 2. History of Atrial fibrillation (I48.91) 3. History of cardiac disorder (Z86.79) 4. History of diabetes mellitus (Z86.39) 5. History of glaucoma (Z86.69) 6. History of sleep apnea (Z87.09) 7. History of transient cerebral ischemia (Z86.73) 8. Personal history of bladder cancer (Z85.51)  Surgical History Problems  1. History of Appendectomy 2. History of Bladder Surgery 3. History of Colon Surgery 4. History of Shoulder Surgery  Current Meds 1. Acetaminophen 500 MG Oral Capsule;  Therapy: (Recorded:17Dec2015) to Recorded 2. Carbidopa-Levodopa 25-100 MG Oral Tablet;  Therapy: (Recorded:17Dec2015) to Recorded 3. Diazepam 2 MG Oral Tablet;  Therapy: (Recorded:28Jan2016) to  Recorded 4. Furosemide 20 MG Oral Tablet; 20mg  1 qd;  Therapy: (Recorded:26Jan2012) to Recorded 5. Gabapentin CAPS;  Therapy: (Recorded:04Dec2014) to Recorded 6. Lantus SoloStar 100 UNIT/ML Subcutaneous Solution Pen-injector;  Therapy: (Recorded:28Jan2016) to Recorded 7. Levothyroxine Sodium 100 MCG Oral Tablet; 100mg  1 qd;  Therapy: (Recorded:26Jan2012) to Recorded 8. Lovastatin TABS; TAKE 1 TABLET DAILY;  Therapy: (Recorded:26Jan2012) to Recorded 9. Metoprolol Succinate ER 100 MG Oral Tablet Extended Release 24 Hour;  Therapy: (Recorded:28Jan2016) to Recorded 10. MiraLax POWD; prn;   Therapy: (Recorded:26Jan2012) to Recorded 11. Sertraline HCl - 25 MG Oral Tablet;   Therapy: (Recorded:07Apr2015) to Recorded 12. Tamsulosin HCl - 0.4 MG Oral Capsule; Take 1 capsule by mouth once daily;   Therapy: [phone] to (Last Rx:28Jan2016)  Requested for: 28Jan2016 Ordered 13. Trospium Chloride 20 MG Oral Tablet; TAKE 1 TABLET BY MOUTH TWICE DAILY;   Therapy: 03Aug2011 to (Last Rx:24Nov2015)  Requested for: [id number] Ordered 14. Vitamin B-12 TABS;   Therapy: (Recorded:28Jan2016) to Recorded 15. Warfarin Sodium 5 MG Oral Tablet;   Therapy: (Recorded:28Jan2016) to Recorded  Allergies Medication  1. Penicillins  Family History Problems  1. Family history of Brain Cancer : Mother 2. Family history of Colon Cancer : Sister 3. Family history of Lung Cancer : Mother 4. Family history of Prostate Cancer : Paternal Uncle 5. Family history of Transient Ischemic Attack : Father  Social History Problems  1. Family history of Death In The Family Father   42, stroke 2. Family history of Death In The Family Mother   65, lung cancer/brain cancer 3. Former smoker [phone]) 4. Marital History - Currently Married 5. History of Tobacco Use   quit 22 years ago; smoked one pack per day for 34  years  Vitals Vital Signs [Data Includes: Last 1 Day]  Recorded: [id number] 09:10AM  Blood Pressure: 109  / 64 Temperature: 97 F Heart Rate: 90  Physical Exam Constitutional: Well nourished and well developed . No acute distress.  Abdomen: The abdomen is soft and nontender. No masses are palpated. No CVA tenderness. No hernias are palpable. No hepatosplenomegaly noted.  Neuro/Psych:. Mood and affect are appropriate.    Results/Data  Old records or history reviewed: office and epic.  The following images/tracing/specimen were independently visualized:  U/s.    Procedure 22 French 3-way catheter irrigated. Irrigation clear.   Assessment Assessed  1. Malignant neoplasm of overlapping sites of bladder (C67.8) 2. Gross hematuria (R31.0)  Plan Malignant neoplasm of overlapping sites of bladder  1. Follow-up Schedule Surgery Office  Follow-up  Status: Hold For - Appointment   Requested for: [phone] 2. Cath & Irrigation; Status:Hold For - Appointment,Date of Service; Requested  for:08Feb2016;   Discussion/Summary Bladder cancer with recurrent gross hematuria x 2 recent and drop in hemoglobin over the past 2 months. Plan was to consider repeat resection in April but I don't think he is going to make it that long. He needs cystoscopy, TURBT and fulguration ASAP. We'll get this arranged. Urine culture from ED pending. I covered him with Levaquin for the case. I told his wife to take patient to emergency department if he has any issues.      cc: Dr. Burnice Logan;  Dr. Risa Grill;     Signatures Electronically signed by : Festus Aloe, M.D.; Jan 12 2015  9:32AM EST

## 2015-01-15 NOTE — Anesthesia Preprocedure Evaluation (Signed)
Anesthesia Evaluation  Patient identified by MRN, date of birth, ID band Patient awake  General Assessment Comment:Family history of malignant neoplasm of gastrointestinal tract   . Unspecified constipation  . Abnormal involuntary movements(781.0)  . Vitamin B12 deficiency  . Claudication  . Hypertrophy of prostate with urinary obstruction and other lower urinary tract symptoms (LUTS)  . Obesity  . Mixed hyperlipidemia  . Atrial fibrillation    --myoview 07/2006: not gated. No ischemia or infarct Diastolic HF-- Echo 04/3298 ER60% mild AI/MR. RV mild to moderately dilated/ dysfunction. ? restrictive CM . RVSP 36 . Bladder polyps  . Kidney stones  . Unspecified sleep apnea    NPSG 05/14/2007- AHI 80.7/ hr . OSA (obstructive sleep apnea)    "tried mask; couldn't use it; so I quit" (11/12/2014) . Hypothyroidism  . History of hiatal hernia  . Daily headache    "I've had headaches all my life; get them almost daily" (11/12/2014) . Stroke ~ 1985   "simple stroke" denies residual on 11/12/2014 . Arthritis    "just about qwhere"  . Pleural effusion 11/12/2014 admission . Dysrhythmia    Afib . Type II or unspecified type diabetes mellitus with neurological manifestations, not stated as uncontrolled  . Shortness of breath dyspnea    walking distance or climbing stairs . Neuromuscular disorder    Parkinson's / age 62 had growth per left shoulder/neck area that stopped blood flow to left arm was removed and has not no further problems.        Reviewed: Allergy & Precautions, NPO status , Patient's Chart, lab work & pertinent test results  History of Anesthesia Complications Negative for: history of anesthetic complications  Airway Mallampati: II  TM Distance: >3 FB Neck ROM: Full    Dental no notable dental hx. (+) Dental Advisory Given    Pulmonary shortness of breath and with exertion, sleep apnea and Continuous Positive Airway Pressure Ventilation , COPD COPD inhaler, former smoker,  Pleural effusion drained 12/15 and with catheter in place breath sounds clear to auscultation  Pulmonary exam normal       Cardiovascular + Peripheral Vascular Disease + dysrhythmias Atrial Fibrillation Rhythm:Regular Rate:Normal     Neuro/Psych  Headaches, Parkinsons CVA negative psych ROS   GI/Hepatic negative GI ROS, Neg liver ROS,   Endo/Other  diabetes, Oral Hypoglycemic AgentsHypothyroidism   Renal/GU CRFRenal disease  negative genitourinary   Musculoskeletal  (+) Arthritis -, Osteoarthritis,    Abdominal   Peds negative pediatric ROS (+)  Hematology negative hematology ROS (+)   Anesthesia Other Findings   Reproductive/Obstetrics negative OB ROS                             Anesthesia Physical Anesthesia Plan  ASA: III  Anesthesia Plan: General   Post-op Pain Management:    Induction: Intravenous  Airway Management Planned: LMA  Additional Equipment:   Intra-op Plan:   Post-operative Plan: Extubation in OR  Informed Consent: I have reviewed the patients History and Physical, chart, labs and discussed the procedure including the risks, benefits and alternatives for the proposed anesthesia with the patient or authorized representative who has indicated his/her understanding and acceptance.   Dental advisory given  Plan Discussed with: CRNA  Anesthesia Plan Comments:         Anesthesia Quick Evaluation

## 2015-01-15 NOTE — Discharge Instructions (Signed)
Transurethral Resection of Bladder Tumor (TURBT)   Definition:  Transurethral Resection of the Bladder Tumor is a surgical procedure used to diagnose and remove tumors within the bladder. TURBT is the most common treatment for early stage bladder cancer.  General instructions:     Your recent bladder surgery requires very little post hospital care but some definite precautions.  Despite the fact that no skin incisions were used, the area around the bladder incisions are raw and covered with scabs to promote healing and prevent bleeding. Certain precautions are needed to insure that the scabs are not disturbed over the next 2-4 weeks while the healing proceeds.  Because the raw surface inside your bladder and the irritating effects of urine you may expect frequency of urination and/or urgency (a stronger desire to urinate) and perhaps even getting up at night more often. This will usually resolve or improve slowly over the healing period. You may see some blood in your urine over the first 6 weeks. Do not be alarmed, even if the urine was clear for a while. Get off your feet and drink lots of fluids until clearing occurs. If you start to pass clots or don't improve call us.  Diet:  You may return to your normal diet immediately. Because of the raw surface of your bladder, alcohol, spicy foods, foods high in acid and drinks with caffeine may cause irritation or frequency and should be used in moderation. To keep your urine flowing freely and avoid constipation, drink plenty of fluids during the day (8-10 glasses). Tip: Avoid cranberry juice because it is very acidic.  Activity:  Your physical activity doesn't need to be restricted. However, if you are very active, you may see some blood in the urine. We suggest that you reduce your activity under the circumstances until the bleeding has stopped.  Bowels:  It is important to keep your bowels regular during the postoperative period. Straining  with bowel movements can cause bleeding. A bowel movement every other day is reasonable. Use a mild laxative if needed, such as milk of magnesia 2-3 tablespoons, or 2 Dulcolax tablets. Call if you continue to have problems. If you had been taking narcotics for pain, before, during or after your surgery, you may be constipated. Take a laxative if necessary.    Medication:  You should resume your pre-surgery medications unless told not to. In addition you may be given an antibiotic to prevent or treat infection. Antibiotics are not always necessary. All medication should be taken as prescribed until the bottles are finished unless you are having an unusual reaction to one of the drugs.  Would continue to hold Coumadin for at least 1 week and then can restart if urine is clear

## 2015-01-15 NOTE — Anesthesia Postprocedure Evaluation (Signed)
  Anesthesia Post-op Note  Patient: [name] [name]  Procedure(s) Performed: Procedure(s) (LRB): CYSTOSCOPY (N/A) TRANSURETHRAL RESECTION OF BLADDER TUMOR (TURBT) (N/A)  Patient Location: PACU  Anesthesia Type: General  Level of Consciousness: awake and alert   Airway and Oxygen Therapy: Patient Spontanous Breathing  Post-op Pain: mild  Post-op Assessment: Post-op Vital signs reviewed, Patient's Cardiovascular Status Stable, Respiratory Function Stable, Patent Airway and No signs of Nausea or vomiting  Last Vitals:  Filed Vitals:   01/15/15 1327  BP:   Pulse: 123  Temp:   Resp: 16    Post-op Vital Signs: stable   Complications: No apparent anesthesia complications

## 2015-01-16 ENCOUNTER — Encounter (HOSPITAL_COMMUNITY): Payer: Self-pay | Admitting: Urology

## 2015-01-16 DIAGNOSIS — I509 Heart failure, unspecified: Secondary | ICD-10-CM | POA: Diagnosis not present

## 2015-01-26 ENCOUNTER — Encounter: Payer: Self-pay | Admitting: Internal Medicine

## 2015-01-26 ENCOUNTER — Ambulatory Visit (INDEPENDENT_AMBULATORY_CARE_PROVIDER_SITE_OTHER): Payer: Medicare Other | Admitting: Internal Medicine

## 2015-01-26 VITALS — BP 118/74 | HR 103 | Temp 97.6°F | Ht 72.0 in | Wt 220.0 lb

## 2015-01-26 DIAGNOSIS — E038 Other specified hypothyroidism: Secondary | ICD-10-CM

## 2015-01-26 DIAGNOSIS — C679 Malignant neoplasm of bladder, unspecified: Secondary | ICD-10-CM

## 2015-01-26 DIAGNOSIS — J438 Other emphysema: Secondary | ICD-10-CM

## 2015-01-26 DIAGNOSIS — R319 Hematuria, unspecified: Secondary | ICD-10-CM

## 2015-01-26 DIAGNOSIS — I481 Persistent atrial fibrillation: Secondary | ICD-10-CM

## 2015-01-26 DIAGNOSIS — I4819 Other persistent atrial fibrillation: Secondary | ICD-10-CM

## 2015-01-26 DIAGNOSIS — E034 Atrophy of thyroid (acquired): Secondary | ICD-10-CM

## 2015-01-26 LAB — CBC WITH DIFFERENTIAL/PLATELET
BASOS PCT: 0.4 % (ref 0.0–3.0)
Basophils Absolute: 0 10*3/uL (ref 0.0–0.1)
EOS ABS: 0.2 10*3/uL (ref 0.0–0.7)
EOS PCT: 3 % (ref 0.0–5.0)
HEMATOCRIT: 29.5 % — AB (ref 39.0–52.0)
Hemoglobin: 9.5 g/dL — ABNORMAL LOW (ref 13.0–17.0)
Lymphocytes Relative: 15.6 % (ref 12.0–46.0)
Lymphs Abs: 1.2 10*3/uL (ref 0.7–4.0)
MCHC: 32.3 g/dL (ref 30.0–36.0)
MCV: 87.2 fl (ref 78.0–100.0)
MONOS PCT: 9.6 % (ref 3.0–12.0)
Monocytes Absolute: 0.7 10*3/uL (ref 0.1–1.0)
NEUTROS ABS: 5.5 10*3/uL (ref 1.4–7.7)
Neutrophils Relative %: 71.4 % (ref 43.0–77.0)
Platelets: 308 10*3/uL (ref 150.0–400.0)
RBC: 3.39 Mil/uL — ABNORMAL LOW (ref 4.22–5.81)
RDW: 15.1 % (ref 11.5–15.5)
WBC: 7.7 10*3/uL (ref 4.0–10.5)

## 2015-01-26 NOTE — Progress Notes (Signed)
Pre visit review using our clinic review tool, if applicable. No additional management support is needed unless otherwise documented below in the visit note. 

## 2015-01-26 NOTE — Progress Notes (Signed)
Subjective:    Patient ID: [name] [name], male    DOB: 05/19/1931, 81 y.o.   MRN: [id number]  HPI  81 year old patient who is seen today for follow-up.  He has a history of diabetes, which he states has been stable. He has chronic atrial fibrillation and has been off Coumadin anticoagulation since a TURBT on February 11.  He continues to have intermittent mild hematuria.  He continues to have a chronic Foley catheter His pulmonary status has been stable.  He has a right sided pleural drainage catheter with very little output.  [facility] patient [facility] for [facility] care.  Denies any shortness of breath   Lab Results  Component Value Date   HGBA1C 6.6* 09/11/2014    Past Medical History  Diagnosis Date  . Family history of malignant neoplasm of gastrointestinal tract   . Unspecified constipation   . Abnormal involuntary movements(781.0)   . Vitamin B12 deficiency   . Claudication   . Hypertrophy of prostate with urinary obstruction and other lower urinary tract symptoms (LUTS)   . Obesity   . Mixed hyperlipidemia   . Atrial fibrillation     --myoview 07/2006: not gated. No ischemia or infarct  Diastolic HF-- Echo 01/9517 ER60% mild AI/MR. RV mild to moderately dilated/ dysfunction. ? restrictive CM . RVSP 36  . Bladder polyps   . Kidney stones   . Unspecified sleep apnea     NPSG 05/14/2007- AHI 80.7/ hr  . OSA (obstructive sleep apnea)     "tried mask; couldn't use it; so I quit" (11/12/2014)  . Hypothyroidism   . History of hiatal hernia   . Daily headache     "I've had headaches all my life; get them almost daily" (11/12/2014)  . Stroke ~ 1985    "simple stroke" denies residual on 11/12/2014  . Arthritis     "just about qwhere"   . Pleural effusion 11/12/2014 admission  . Dysrhythmia     Afib  . Type II or unspecified type diabetes mellitus with neurological manifestations, not stated as uncontrolled   . Shortness of breath dyspnea    walking distance or climbing stairs  . Neuromuscular disorder     Parkinson's / age 68 had growth per left shoulder/neck area that stopped blood flow to left arm was removed and has not no further problems.    History   Social History  . Marital Status: Married    Spouse Name: N/A  . Number of Children: 3  . Years of Education: 16   Occupational History  . entrepenure    Social History Main Topics  . Smoking status: Former Smoker -- 1.00 packs/day for 35 years    Types: Cigarettes    Quit date: 12/05/1985  . Smokeless tobacco: Never Used  . Alcohol Use: No  . Drug Use: No  . Sexual Activity: Not on file   Other Topics Concern  . Not on file   Social History Narrative   Married '55. 1 son-'64; 3 daughters- '54, '60, '62. 3 grandaughters. 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 Appendectomy    . Kidney stone surgery      Kidney stone retrieval with uretal stent x 2   . Bladder polyps  many times  last time 12/15  . Transurethral resection of bladder    . Orif elbow fracture Left 1976  . Fracture surgery    . Excisional hemorrhoidectomy  1950's  . Ureteral stent placement  X 1  . Cystoscopy w/ stone manipulation  X 2  . Cataract extraction w/ intraocular lens  implant, bilateral Bilateral   . Eye surgery Bilateral     catarct  . Chest tube insertion Right 12/12/2014    Procedure: INSERTION PLEURAL DRAINAGE CATHETER;  Surgeon: Ivin Poot, MD;  Location: [facility];  Service: Thoracic;  Laterality: Right;  . Colonscopy       removal of polyps  . Cystoscopy N/A 01/15/2015    Procedure: CYSTOSCOPY;  Surgeon: Bernestine Amass, MD;  Location: [facility];  Service: Urology;  Laterality: N/A;  . Transurethral resection of bladder tumor N/A 01/15/2015    Procedure: TRANSURETHRAL RESECTION OF BLADDER TUMOR  (TURBT);  Surgeon: Bernestine Amass, MD;  Location: [facility];  Service: Urology;  Laterality: N/A;    Family History  Problem Relation Age of Onset  . Lung cancer Mother 44    nonsmoker  . Colon cancer Sister   . Prostate cancer Paternal Uncle   . Stroke Father   . Hip fracture Father   . Healthy Son   . Healthy Daughter     Allergies  Allergen Reactions  . Penicillins Rash    Happened 60 years ago    Current Outpatient Prescriptions on File Prior to Visit  Medication Sig Dispense Refill  . acetaminophen (TYLENOL) 500 MG tablet Take 500 mg by mouth every 6 (six) hours as needed for mild pain.    . carbidopa-levodopa (SINEMET IR) 25-100 MG per tablet Take 1 tablet by mouth 3 (three) times daily. 270 tablet 3  . Cyanocobalamin (VITAMIN B 12 PO) Take 1 tablet by mouth daily.    . diazepam (VALIUM) 2 MG tablet Take 1 tablet (2 mg total) by mouth 2 (two) times daily as needed for anxiety. 60 tablet 5  . furosemide (LASIX) 20 MG tablet 1 tablet Monday, Wednesday, Friday only (Patient taking differently: Take 20 mg by mouth every Monday, Wednesday, and Friday. 1 tablet Monday, Wednesday, Friday only) 90 tablet 3  . gabapentin (NEURONTIN) 100 MG capsule Take 2 capsules (200 mg total) by mouth every evening. 1 at bedtime. (Patient taking differently: Take 100-200 mg by mouth 2 (two) times daily. 1 in am 1 in pm) 60 capsule 3  . glucose blood test strip 1 each by Other route daily as needed for other. 100 each 12  . insulin glargine (LANTUS) 100 UNIT/ML injection Inject 0.5 mLs (50 Units total) into the skin at bedtime. 10 mL 11  . Insulin Pen Needle (PEN NEEDLES 31GX5/16") 31G X 8 MM MISC 1 each by Does not apply route as directed. 100 each 1  . Lancets (ONETOUCH ULTRASOFT) lancets 1 each by Other route daily as needed for other. 100 each 12  . levothyroxine (SYNTHROID, LEVOTHROID) 100 MCG tablet Take 1 tablet (100 mcg total) by mouth daily. 90 tablet 0  . lovastatin (MEVACOR) 20 MG tablet TAKE  1 TABLET BY MOUTH ONCE DAILY 30 tablet 3  . metoprolol (TOPROL-XL) 100 MG 24 hr tablet 1 half tablet daily (Patient taking differently: Take 50 mg by mouth every morning. 1 half tablet daily) 90 tablet 3  . Polyethyl Glycol-Propyl Glycol 0.4-0.3 % SOLN Apply 1 drop to eye daily as needed (dry eye).    [name]  polyethylene glycol (MIRALAX) powder Take 17 g by mouth at bedtime. For regularity    . sertraline (ZOLOFT) 25 MG tablet Take 1 tablet (25 mg total) by mouth daily. 30 tablet 11  . Tamsulosin HCl (FLOMAX) 0.4 MG CAPS Take 0.4 mg by mouth daily.      No current facility-administered medications on file prior to visit.    BP 118/74 mmHg  Pulse 103  Temp(Src) 97.6 F (36.4 C) (Oral)  Ht 6' (1.829 m)  Wt 220 lb (99.791 kg)  BMI 29.83 kg/m2     Review of Systems  Constitutional: Negative for fever, chills, appetite change and fatigue.  HENT: Negative for congestion, dental problem, ear pain, hearing loss, sore throat, tinnitus, trouble swallowing and voice change.   Eyes: Negative for pain, discharge and visual disturbance.  Respiratory: Negative for cough, chest tightness, wheezing and stridor.   Cardiovascular: Positive for leg swelling. Negative for chest pain and palpitations.  Gastrointestinal: Negative for nausea, vomiting, abdominal pain, diarrhea, constipation, blood in stool and abdominal distention.  Genitourinary: Positive for hematuria. Negative for urgency, flank pain, discharge, difficulty urinating and genital sores.  Musculoskeletal: Negative for myalgias, back pain, joint swelling, arthralgias, gait problem and neck stiffness.  Skin: Positive for wound. Negative for rash.  Neurological: Negative for dizziness, syncope, speech difficulty, weakness, numbness and headaches.  Hematological: Negative for adenopathy. Does not bruise/bleed easily.  Psychiatric/Behavioral: Negative for behavioral problems and dysphoric mood. The patient is not nervous/anxious.          Objective:   Physical Exam  Constitutional: He is oriented to person, place, and time. He appears well-developed. No distress.  HENT:  Head: Normocephalic.  Right Ear: External ear normal.  Left Ear: External ear normal.  Eyes: Conjunctivae and EOM are normal.  Neck: Normal range of motion.  Cardiovascular: Normal rate and normal heart sounds.   Controlled ventricular response  Pulmonary/Chest: Effort normal. No respiratory distress.  Decreased breath sounds and rales.  Involving the right lower hemithorax  Abdominal: Bowel sounds are normal.  Musculoskeletal: Normal range of motion. He exhibits no edema or tenderness.  Neurological: He is alert and oriented to person, place, and time.  Skin:  Superficial dime-sized pressure ulcer, right buttock area  Psychiatric: He has a normal mood and affect. His behavior is normal.          Assessment & Plan:   Permanent atrial fibrillation.  Will resume Coumadin anticoagulation.  May need to hold again if hematuria worsens Diabetes mellitus History of recurrent right pleural effusion.  Follow-up cardiothoracic surgery next month as planned High-grade invasive bladder cancer.  Follow-up urology next month as scheduled  Recheck here 2 months

## 2015-01-26 NOTE — Patient Instructions (Signed)
Urology follow-up as scheduled  Limit your sodium (Salt) intake  Return in 2 months for follow-up

## 2015-01-29 ENCOUNTER — Ambulatory Visit (INDEPENDENT_AMBULATORY_CARE_PROVIDER_SITE_OTHER): Payer: Medicare Other | Admitting: General Practice

## 2015-01-29 DIAGNOSIS — Z5181 Encounter for therapeutic drug level monitoring: Secondary | ICD-10-CM

## 2015-01-29 LAB — POCT INR: INR: 1.4

## 2015-01-29 NOTE — Progress Notes (Signed)
Pre visit review using our clinic review tool, if applicable. No additional management support is needed unless otherwise documented below in the visit note. 

## 2015-02-05 ENCOUNTER — Ambulatory Visit (INDEPENDENT_AMBULATORY_CARE_PROVIDER_SITE_OTHER): Payer: Medicare Other | Admitting: General Practice

## 2015-02-05 ENCOUNTER — Telehealth: Payer: Self-pay | Admitting: Internal Medicine

## 2015-02-05 DIAGNOSIS — Z5181 Encounter for therapeutic drug level monitoring: Secondary | ICD-10-CM

## 2015-02-05 LAB — POCT INR: INR: 3.5

## 2015-02-05 NOTE — Telephone Encounter (Signed)
Patient's is PT 41.5 and INR 3.5.  Almyra Free would like a callback

## 2015-02-05 NOTE — Progress Notes (Signed)
Pre visit review using our clinic review tool, if applicable. No additional management support is needed unless otherwise documented below in the visit note. 

## 2015-02-06 ENCOUNTER — Other Ambulatory Visit: Payer: Self-pay | Admitting: Internal Medicine

## 2015-02-06 DIAGNOSIS — G4733 Obstructive sleep apnea (adult) (pediatric): Secondary | ICD-10-CM

## 2015-02-13 ENCOUNTER — Other Ambulatory Visit: Payer: Self-pay | Admitting: Cardiothoracic Surgery

## 2015-02-13 ENCOUNTER — Telehealth: Payer: Self-pay | Admitting: Internal Medicine

## 2015-02-13 DIAGNOSIS — J90 Pleural effusion, not elsewhere classified: Secondary | ICD-10-CM

## 2015-02-13 NOTE — Telephone Encounter (Signed)
[name] [name], please see message and call pt to schedule follow up coumadin check. Thanks.

## 2015-02-13 NOTE — Telephone Encounter (Signed)
No change in therapy.  Repeat INR in 4 weeks

## 2015-02-13 NOTE — Telephone Encounter (Signed)
Spoke to pt, told him Dr.K said no change in Therapy and to continue same dose regimen. Warfin 5 mg one tablet daily, except 1/2 tablet on Sun, Tues and Friday. Repeat PT/INR in 4 weeks. Pt verbalized understanding and stated he normally goes to [facility] for coumadin check. Told him I will send message to [facility] and she will call him on Monday to schedule. Pt verbalized understanding.

## 2015-02-13 NOTE — Telephone Encounter (Signed)
[facility], Almyra Free got a reading of  I & R   2.1 PT     25.4  Need to know what to do.  Pt held dose Wednesday night (1mg ) due to dental surgery.  Resumed last night.   Current dose: Pt is on  (1) 5 mg daily except 1/2 tab Sun, Tues, Fri

## 2015-02-18 ENCOUNTER — Ambulatory Visit
Admission: RE | Admit: 2015-02-18 | Discharge: 2015-02-18 | Disposition: A | Discharge status: Home / Self Care | Payer: Medicare Other | Source: Ambulatory Visit | Attending: Cardiothoracic Surgery | Admitting: Cardiothoracic Surgery

## 2015-02-18 ENCOUNTER — Ambulatory Visit (INDEPENDENT_AMBULATORY_CARE_PROVIDER_SITE_OTHER): Payer: Medicare Other | Admitting: Cardiothoracic Surgery

## 2015-02-18 ENCOUNTER — Encounter: Payer: Self-pay | Admitting: Cardiothoracic Surgery

## 2015-02-18 ENCOUNTER — Other Ambulatory Visit: Payer: Self-pay | Admitting: *Deleted

## 2015-02-18 VITALS — BP 120/66 | HR 100 | Resp 20 | Ht 72.0 in | Wt 220.0 lb

## 2015-02-18 DIAGNOSIS — J90 Pleural effusion, not elsewhere classified: Secondary | ICD-10-CM

## 2015-02-18 DIAGNOSIS — J948 Other specified pleural conditions: Secondary | ICD-10-CM

## 2015-02-18 NOTE — Progress Notes (Signed)
PCP is Nyoka Cowden, MD Referring Provider is Deneise Lever, MD  Chief Complaint  Patient presents with  . Pleural Effusion    5 week f/u with CXR, no drainage amounts in the last month    HPI:the patient returns for routine office check of right Pleurx catheter placed in mid January for recurrent right pleural effusion. Etiology of effusion is nonmalignant. The patient has probable right heart failure, mild renal insufficiency, and also fell on his right side last fall. The drainage has stopped for the past 2 weeks. Chest x-ray today shows no reaccumulation of effusion. We will schedule the Pleurx catheter to be removed as outpatient in 5 days. The patient will stop his Coumadin taken for atrial fibrillation 3 days before surgery.  We will refer the patient to orthopedics for a probable right rotator cuff tear suffered when he fell last fall.   Past Medical History  Diagnosis Date  . Family history of malignant neoplasm of gastrointestinal tract   . Unspecified constipation   . Abnormal involuntary movements(781.0)   . Vitamin B12 deficiency   . Claudication   . Hypertrophy of prostate with urinary obstruction and other lower urinary tract symptoms (LUTS)   . Obesity   . Mixed hyperlipidemia   . Atrial fibrillation     --myoview 07/2006: not gated. No ischemia or infarct  Diastolic HF-- Echo 0/9233 ER60% mild AI/MR. RV mild to moderately dilated/ dysfunction. ? restrictive CM . RVSP 36  . Bladder polyps   . Kidney stones   . Unspecified sleep apnea     NPSG 05/14/2007- AHI 80.7/ hr  . OSA (obstructive sleep apnea)     "tried mask; couldn't use it; so I quit" (11/12/2014)  . Hypothyroidism   . History of hiatal hernia   . Daily headache     "I've had headaches all my life; get them almost daily" (11/12/2014)  . Stroke ~ 1985    "simple stroke" denies residual on 11/12/2014  . Arthritis     "just about qwhere"   . Pleural effusion 11/12/2014 admission  . Dysrhythmia      Afib  . Type II or unspecified type diabetes mellitus with neurological manifestations, not stated as uncontrolled   . Shortness of breath dyspnea     walking distance or climbing stairs  . Neuromuscular disorder     Parkinson's / age 25 had growth per left shoulder/neck area that stopped blood flow to left arm was removed and has not no further problems.    Past Surgical History  Procedure Laterality Date  . Appendectomy    . Kidney stone surgery      Kidney stone retrieval with uretal stent x 2   . Bladder polyps      many times  last time 12/15  . Transurethral resection of bladder    . Orif elbow fracture Left 1976  . Fracture surgery    . Excisional hemorrhoidectomy  1950's  . Ureteral stent placement  X 1  . Cystoscopy w/ stone manipulation  X 2  . Cataract extraction w/ intraocular lens  implant, bilateral Bilateral   . Eye surgery Bilateral     catarct  . Chest tube insertion Right 12/12/2014    Procedure: INSERTION PLEURAL DRAINAGE CATHETER;  Surgeon: Ivin Poot, MD;  Location: [facility];  Service: Thoracic;  Laterality: Right;  . Colonscopy       removal of polyps  . Cystoscopy N/A 01/15/2015    Procedure: CYSTOSCOPY;  Surgeon:  Bernestine Amass, MD;  Location: [facility];  Service: Urology;  Laterality: N/A;  . Transurethral resection of bladder tumor N/A 01/15/2015    Procedure: TRANSURETHRAL RESECTION OF BLADDER TUMOR (TURBT);  Surgeon: Bernestine Amass, MD;  Location[facility];  Service: Urology;  Laterality: N/A;    Family History  Problem Relation Age of Onset  . Lung cancer Mother 25    nonsmoker  . Colon cancer Sister   . Prostate cancer Paternal Uncle   . Stroke Father   . Hip fracture Father   . Healthy Son   . Healthy Daughter     Social History History  Substance Use Topics  . Smoking status: Former Smoker -- 1.00 packs/day for 35 years    Types: Cigarettes    Quit date: 12/05/1985  . Smokeless tobacco: Never Used  . Alcohol Use: No    Current  Outpatient Prescriptions  Medication Sig Dispense Refill  . acetaminophen (TYLENOL) 500 MG tablet Take 500 mg by mouth every 6 (six) hours as needed for mild pain.    . carbidopa-levodopa (SINEMET IR) 25-100 MG per tablet Take 1 tablet by mouth 3 (three) times daily. 270 tablet 3  . Cyanocobalamin (VITAMIN B 12 PO) Take 1 tablet by mouth daily.    . diazepam (VALIUM) 2 MG tablet Take 1 tablet (2 mg total) by mouth 2 (two) times daily as needed for anxiety. 60 tablet 5  . furosemide (LASIX) 20 MG tablet 1 tablet Monday, Wednesday, Friday only (Patient taking differently: Take 20 mg by mouth every Monday, Wednesday, and Friday. 1 tablet Monday, Wednesday, Friday only) 90 tablet 3  . gabapentin (NEURONTIN) 100 MG capsule Take 2 capsules (200 mg total) by mouth every evening. 1 at bedtime. (Patient taking differently: Take 100-200 mg by mouth 2 (two) times daily. 1 in am 1 in pm) 60 capsule 3  . glucose blood test strip 1 each by Other route daily as needed for other. 100 each 12  . insulin glargine (LANTUS) 100 UNIT/ML injection Inject 0.5 mLs (50 Units total) into the skin at bedtime. 10 mL 11  . Insulin Pen Needle (PEN NEEDLES 31GX5/16") 31G X 8 MM MISC 1 each by Does not apply route as directed. 100 each 1  . Lancets (ONETOUCH ULTRASOFT) lancets 1 each by Other route daily as needed for other. 100 each 12  . levothyroxine (SYNTHROID, LEVOTHROID) 100 MCG tablet Take 1 tablet (100 mcg total) by mouth daily. 90 tablet 0  . lovastatin (MEVACOR) 20 MG tablet TAKE 1 TABLET BY MOUTH ONCE DAILY 30 tablet 3  . metoprolol (TOPROL-XL) 100 MG 24 hr tablet 1 half tablet daily (Patient taking differently: Take 50 mg by mouth every morning. 1 half tablet daily) 90 tablet 3  . Polyethyl Glycol-Propyl Glycol 0.4-0.3 % SOLN Apply 1 drop to eye daily as needed (dry eye).    . polyethylene glycol (MIRALAX) powder Take 17 g by mouth at bedtime. For regularity    . sertraline (ZOLOFT) 25 MG tablet Take 1 tablet (25 mg  total) by mouth daily. 30 tablet 11  . Tamsulosin HCl (FLOMAX) 0.4 MG CAPS Take 0.4 mg by mouth daily.     [name] warfarin (COUMADIN) 5 MG tablet Take 5 mg by mouth daily. 2.5mg  on Sun/Tues./Fri's     No current facility-administered medications for this visit.    Allergies  Allergen Reactions  . Penicillins Rash    Happened 60 years ago    Review of Systems  Gen.-no fever no drainage from Pleurx catheter or sign of infection, generally stronger ,no recent falls Thoracic-no shortness of breath Cardiac-no symptoms CHF GI-no pain or dysphagia or choking Neuro-no dizziness or syncope      EXAM Gen.-ulnarly weak-appearing gentleman no acute distress HEENT-normocephalic, pupils equal Neck-neck angled forward without JVD Chest-breath sounds clear and equal, right Pleurx catheter dressing clean and dry Cardiac-irregular rhythm-atrial fibrillation Abdomen-soft nontender Extremities-2+ edema Neurologic-no focal motor weakness, generalized weakness uses a cane to walk   BP 120/66 mmHg  Pulse 100  Resp 20  Ht 6' (1.829 m)  Wt 220 lb (99.791 kg)  BMI 29.83 kg/m2  SpO2 97% Physical Exam  Diagnostic Tests: Chest x-ray personally reviewed showing the Pleurx catheter in good position, no significant pleural effusion  Impression: Resolved right pleural effusion We'll schedule removal of Pleurx catheter as outpatient at [place] short stay procedure room  Plan: Removal of right Pleurx catheter March 21 at [place] short stay procedure room 1 PM. Patient will stop his Coumadin 3 days prior to surgery.  Len Childs, MD [facility] [phone]

## 2015-02-23 ENCOUNTER — Ambulatory Visit (HOSPITAL_COMMUNITY)
Admission: RE | Admit: 2015-02-23 | Discharge: 2015-02-23 | Disposition: A | Discharge status: Home / Self Care | Payer: Medicare Other | Source: Ambulatory Visit | Attending: Cardiothoracic Surgery | Admitting: Cardiothoracic Surgery

## 2015-02-23 ENCOUNTER — Encounter (HOSPITAL_COMMUNITY)
Admission: RE | Disposition: A | Discharge status: Home / Self Care | Payer: Self-pay | Source: Ambulatory Visit | Attending: Cardiothoracic Surgery

## 2015-02-23 DIAGNOSIS — J948 Other specified pleural conditions: Secondary | ICD-10-CM | POA: Diagnosis not present

## 2015-02-23 DIAGNOSIS — J90 Pleural effusion, not elsewhere classified: Secondary | ICD-10-CM | POA: Insufficient documentation

## 2015-02-23 HISTORY — PX: REMOVAL OF PLEURAL DRAINAGE CATHETER: SHX5080

## 2015-02-23 SURGERY — REMOVAL, CLOSED DRAINAGE CATHETER SYSTEM, PLEURAL
Anesthesia: Monitor Anesthesia Care | Laterality: Right

## 2015-02-23 MED ORDER — OXYCODONE HCL 5 MG PO TABS
ORAL_TABLET | ORAL | Status: AC
  Filled 2015-02-23: qty 1

## 2015-02-23 MED ORDER — OXYCODONE HCL 5 MG PO TABS: 5.0000 mg | ORAL_TABLET | ORAL | Status: DC | PRN

## 2015-02-23 MED ORDER — LIDOCAINE HCL (PF) 1 % IJ SOLN
INTRAMUSCULAR | Status: AC
  Filled 2015-02-23: qty 5

## 2015-02-23 MED ORDER — OXYCODONE HCL 5 MG PO TABS
5.0000 mg | ORAL_TABLET | Freq: Once | ORAL | Status: AC
  Administered 2015-02-23: 5 mg via ORAL

## 2015-02-23 NOTE — Procedures (Signed)
      [address]       [place] [address]             [phone]      [name] [name]    MRN: [id number]   Pre Procedure Diagnosis:  Recurrent right pleural effusion  Post Procedure Diagnosis: Recurrent right pleural effusion  Procedure:  Pleur-x Catheter Removal Right  Anesthesia- Local 1% Lidocaine  The patient was placed with his left side down.  His right 5th intercostal space was cleaned with Betadine and prepped using sterile technique.  The suture was removed from the Pleur-x catheter.  5 ml of Lidocaine was placed around the Pleur-x catheter exit site.  Debridement of adhesions was performed, however the cuff was not able to be visualized.  Palpation along the right thoracic region revealed the cuff to about 3 inches above the exit site.  Therefore, another 5 ml of Lidocaine was placed and a small 2 in incision was made directly over the Pleur-x catheter cuff.  The cuff was visualized and blunt dissection of adhesions was performed.  Once cuff was free from adhesions it was removed without difficulty.  The incision was closed with 3 interrupted 3-0 Nylon Sutures.  The exit site was also closed with a single 3-0 Nylon suture.  There was minimal blood loss and the procedure was tolerated without difficulty.  The patient was stable post procedure.  He was given a 5 mg Oxy IR tablet and a prescription for Oxy IR 1 tablet by mouth every 4-6 hours as needed for pain.  Dispense 30 with no refills.  He will follow up with Dr. Prescott Gum with CXR prior to appointment on 03/11/2015.  Erin Barrett PA-C  Agree with procedure as documented above

## 2015-02-24 ENCOUNTER — Encounter (HOSPITAL_COMMUNITY): Payer: Self-pay | Admitting: Cardiothoracic Surgery

## 2015-02-24 ENCOUNTER — Encounter (HOSPITAL_COMMUNITY): Payer: Self-pay | Admitting: *Deleted

## 2015-02-24 ENCOUNTER — Other Ambulatory Visit: Payer: Self-pay

## 2015-02-25 ENCOUNTER — Telehealth: Payer: Self-pay | Admitting: Neurology

## 2015-02-25 NOTE — Telephone Encounter (Signed)
Called patient he states that he is twitching a lot more recently it is more dramatic at night when he tries to sleep and is keeping him from getting any rest. Please advise

## 2015-02-25 NOTE — Telephone Encounter (Signed)
Pt requesting call back ASAP, pt reports his PD symptoms have increased dramatically as of today. Please call back [phone]/cell or [phone]/home / [name] S.

## 2015-02-25 NOTE — Telephone Encounter (Signed)
Returned call to patient.  He reports having more internal twitches at night time, which previously improved after starting carbidopa-levadopa but recently worsened.  No new medication changes.  He reports having his pleural drain removed earlier this week.  I recommend that he increase his evening dose of sinemet to (2) tablets 25/100mg  and continue (1) tablet in the morning and afternoon.  [name] Heslop K. Posey Pronto, DO

## 2015-02-26 ENCOUNTER — Telehealth: Payer: Self-pay | Admitting: Neurology

## 2015-02-26 NOTE — Telephone Encounter (Signed)
Patient not doing better

## 2015-02-26 NOTE — Telephone Encounter (Signed)
Called patient on cell and home phone.  Left messages for him to call me back.

## 2015-02-26 NOTE — Telephone Encounter (Signed)
Pt called again at 10:03AM wanting to speak to a nurse regarding the message from earlier today.

## 2015-02-26 NOTE — Telephone Encounter (Signed)
Pt called wanting to speak to a nurse regarding his current parkinson's. C/b [phone]

## 2015-03-02 ENCOUNTER — Telehealth: Payer: Self-pay | Admitting: Internal Medicine

## 2015-03-02 ENCOUNTER — Other Ambulatory Visit: Payer: Self-pay | Admitting: Family Medicine

## 2015-03-02 NOTE — Telephone Encounter (Signed)
Spoke to pt, told him will need an office visit with Dr.K in order to get a bed. Pt verbalized understanding, transferred to scheduling for Face to Face.

## 2015-03-02 NOTE — Telephone Encounter (Signed)
I called patient back and he said that he had 2 rough nights.  He said that he is doing much better now since he has increased his carbadopa levodopa to 6 pills a day.  I told him that I would call him back if there are any further instructions.

## 2015-03-02 NOTE — Telephone Encounter (Signed)
Noted  

## 2015-03-02 NOTE — Telephone Encounter (Signed)
Pt is having sores on buttock and wife would like a hospital bed [facility]. Please send fax [facility] to [facility]

## 2015-03-10 ENCOUNTER — Other Ambulatory Visit: Payer: Self-pay | Admitting: Cardiothoracic Surgery

## 2015-03-10 ENCOUNTER — Other Ambulatory Visit: Payer: Self-pay | Admitting: Internal Medicine

## 2015-03-10 DIAGNOSIS — J90 Pleural effusion, not elsewhere classified: Secondary | ICD-10-CM

## 2015-03-11 ENCOUNTER — Encounter: Payer: Self-pay | Admitting: Cardiothoracic Surgery

## 2015-03-11 ENCOUNTER — Ambulatory Visit: Payer: Medicare Other | Admitting: Cardiothoracic Surgery

## 2015-03-11 ENCOUNTER — Ambulatory Visit (INDEPENDENT_AMBULATORY_CARE_PROVIDER_SITE_OTHER): Payer: Medicare Other | Admitting: General Practice

## 2015-03-11 ENCOUNTER — Ambulatory Visit (INDEPENDENT_AMBULATORY_CARE_PROVIDER_SITE_OTHER): Payer: Medicare Other | Admitting: Cardiothoracic Surgery

## 2015-03-11 ENCOUNTER — Ambulatory Visit
Admission: RE | Admit: 2015-03-11 | Discharge: 2015-03-11 | Disposition: A | Discharge status: Home / Self Care | Payer: Medicare Other | Source: Ambulatory Visit | Attending: Cardiothoracic Surgery | Admitting: Cardiothoracic Surgery

## 2015-03-11 VITALS — BP 122/68 | HR 84 | Resp 20 | Ht 72.0 in | Wt 200.0 lb

## 2015-03-11 DIAGNOSIS — Z5181 Encounter for therapeutic drug level monitoring: Secondary | ICD-10-CM

## 2015-03-11 DIAGNOSIS — J90 Pleural effusion, not elsewhere classified: Secondary | ICD-10-CM

## 2015-03-11 LAB — POCT INR: INR: 2.5

## 2015-03-11 NOTE — Progress Notes (Signed)
Pre visit review using our clinic review tool, if applicable. No additional management support is needed unless otherwise documented below in the visit note. 

## 2015-03-11 NOTE — Progress Notes (Signed)
Agree with plan 

## 2015-03-11 NOTE — Progress Notes (Signed)
PCP is Nyoka Cowden, MD Referring Provider is Deneise Lever, MD  Chief Complaint  Patient presents with  . Pleural Effusion    2 week f/u with CXR s/p removal of pleurX 02/23/15    [id number] returns for Pleurx catheter followup. He had a right Pleurx catheter placed 4 months ago for right pleural effusion secondary to right heart failure. With medical therapy the effusion has resolved and Pleurx catheter removed. Chest x-ray today shows no recurrence of right pleural effusion. On exam the catheter exit site is well-healed.   Past Medical History  Diagnosis Date  . Family history of malignant neoplasm of gastrointestinal tract   . Unspecified constipation   . Abnormal involuntary movements(781.0)   . Vitamin B12 deficiency   . Claudication   . Hypertrophy of prostate with urinary obstruction and other lower urinary tract symptoms (LUTS)   . Obesity   . Mixed hyperlipidemia   . Atrial fibrillation     --myoview 07/2006: not gated. No ischemia or infarct  Diastolic HF-- Echo 07/5461 ER60% mild AI/MR. RV mild to moderately dilated/ dysfunction. ? restrictive CM . RVSP 36  . Bladder polyps   . Kidney stones   . Unspecified sleep apnea     NPSG 05/14/2007- AHI 80.7/ hr  . OSA (obstructive sleep apnea)     "tried mask; couldn't use it; so I quit" (11/12/2014)  . Hypothyroidism   . History of hiatal hernia   . Daily headache     "I've had headaches all my life; get them almost daily" (11/12/2014)  . Stroke ~ 1985    "simple stroke" denies residual on 11/12/2014  . Arthritis     "just about qwhere"   . Pleural effusion 11/12/2014 admission  . Dysrhythmia     Afib  . Type II or unspecified type diabetes mellitus with neurological manifestations, not stated as uncontrolled   . Shortness of breath dyspnea     walking distance or climbing stairs  . Neuromuscular disorder     Parkinson's / age 48 had growth per left shoulder/neck area that stopped blood flow to left arm was  removed and has not no further problems.    Past Surgical History  Procedure Laterality Date  . Appendectomy    . Kidney stone surgery      Kidney stone retrieval with uretal stent x 2   . Bladder polyps      many times  last time 12/15  . Transurethral resection of bladder    . Orif elbow fracture Left 1976  . Fracture surgery    . Excisional hemorrhoidectomy  1950's  . Ureteral stent placement  X 1  . Cystoscopy w/ stone manipulation  X 2  . Cataract extraction w/ intraocular lens  implant, bilateral Bilateral   . Eye surgery Bilateral     catarct  . Chest tube insertion Right 12/12/2014    Procedure: INSERTION PLEURAL DRAINAGE CATHETER;  Surgeon: Ivin Poot, MD;  Location: [place];  Service: Thoracic;  Laterality: Right;  . Colonscopy       removal of polyps  . Cystoscopy N/A 01/15/2015    Procedure: CYSTOSCOPY;  Surgeon: Bernestine Amass, MD;  Location: [facility];  Service: Urology;  Laterality: N/A;  . Transurethral resection of bladder tumor N/A 01/15/2015    Procedure: TRANSURETHRAL RESECTION OF BLADDER TUMOR (TURBT);  Surgeon: Bernestine Amass, MD;  Location: [facility];  Service: Urology;  Laterality: N/A;  . Removal of pleural drainage catheter Right 02/23/2015  Procedure: REMOVAL OF PLEURAL DRAINAGE CATHETER;  Surgeon: Ivin Poot, MD;  Location: [facility];  Service: Thoracic;  Laterality: Right;    Family History  Problem Relation Age of Onset  . Lung cancer Mother 71    nonsmoker  . Colon cancer Sister   . Prostate cancer Paternal Uncle   . Stroke Father   . Hip fracture Father   . Healthy Son   . Healthy Daughter     Social History History  Substance Use Topics  . Smoking status: Former Smoker -- 1.00 packs/day for 35 years    Types: Cigarettes    Quit date: 12/05/1985  . Smokeless tobacco: Never Used  . Alcohol Use: No    Current Outpatient Prescriptions  Medication Sig Dispense Refill  . acetaminophen (TYLENOL) 500 MG tablet Take 500 mg by mouth every 6  (six) hours as needed for mild pain.    . carbidopa-levodopa (SINEMET IR) 25-100 MG per tablet Take 1 tablet by mouth 3 (three) times daily. 270 tablet 3  . Cyanocobalamin (VITAMIN B 12 PO) Take 1 tablet by mouth daily.    . diazepam (VALIUM) 2 MG tablet Take 1 tablet (2 mg total) by mouth 2 (two) times daily as needed for anxiety. 60 tablet 5  . furosemide (LASIX) 20 MG tablet 1 tablet Monday, Wednesday, Friday only (Patient taking differently: Take 20 mg by mouth every Monday, Wednesday, and Friday. 1 tablet Monday, Wednesday, Friday only) 90 tablet 3  . gabapentin (NEURONTIN) 100 MG capsule Take 2 capsules (200 mg total) by mouth every evening. 1 at bedtime. (Patient taking differently: Take 100-200 mg by mouth 2 (two) times daily. 1 in am 1 in pm) 60 capsule 3  . glucose blood test strip 1 each by Other route daily as needed for other. 100 each 12  . insulin glargine (LANTUS) 100 UNIT/ML injection Inject 0.5 mLs (50 Units total) into the skin at bedtime. 10 mL 11  . Insulin Pen Needle (PEN NEEDLES 31GX5/16") 31G X 8 MM MISC 1 each by Does not apply route as directed. 100 each 1  . Lancets (ONETOUCH ULTRASOFT) lancets 1 each by Other route daily as needed for other. 100 each 12  . levothyroxine (SYNTHROID, LEVOTHROID) 100 MCG tablet TAKE 1 TABLET BY MOUTH DAILY 90 tablet 0  . lovastatin (MEVACOR) 20 MG tablet TAKE 1 TABLET BY MOUTH ONCE DAILY 30 tablet 3  . metoprolol (TOPROL-XL) 100 MG 24 hr tablet 1 half tablet daily (Patient taking differently: Take 50 mg by mouth every morning. 1 half tablet daily) 90 tablet 3  . oxyCODONE (OXY IR/ROXICODONE) 5 MG immediate release tablet Take 1 tablet (5 mg total) by mouth every 4 (four) hours as needed for severe pain. 30 tablet 0  . Polyethyl Glycol-Propyl Glycol 0.4-0.3 % SOLN Apply 1 drop to eye daily as needed (dry eye).    . polyethylene glycol (MIRALAX) powder Take 17 g by mouth at bedtime. For regularity    . sertraline (ZOLOFT) 25 MG tablet Take 1  tablet (25 mg total) by mouth daily. 30 tablet 11  . Tamsulosin HCl (FLOMAX) 0.4 MG CAPS Take 0.4 mg by mouth daily.     . traMADol (ULTRAM) 50 MG tablet TAKE 1 TABLET BY MOUTH EVERY 8 HOURS AS NEEDED 60 tablet 2  . warfarin (COUMADIN) 5 MG tablet Take 5 mg by mouth daily. 2.5mg  on Sun/Tues./Fri's     No current facility-administered medications for this visit.    Allergies  Allergen  Reactions  . Penicillins Rash    Happened 60 years ago    Review of Systemsno complaints regarding the catheter site Patient denies any significant increase in symptoms of heart failure.  Ht 6' (1.829 m)  Wt 200 lb (90.719 kg)  BMI 27.12 kg/m2  SpO2  Physical Exam Alert and comfortable Breath sounds clear and equal Pleurx incision well-healed  Diagnostic Tests: Chest x-ray shows resolution right pleural effusion  Impression: Resolved nonmalignant right pleural effusion  Plan:return as needed   Len Childs, MD [facility] [phone]

## 2015-04-01 ENCOUNTER — Other Ambulatory Visit: Payer: Self-pay

## 2015-04-01 ENCOUNTER — Other Ambulatory Visit: Payer: Self-pay | Admitting: Internal Medicine

## 2015-04-05 NOTE — Progress Notes (Signed)
Ok to rf? 

## 2015-04-06 MED ORDER — GABAPENTIN 100 MG PO CAPS: 200.0000 mg | ORAL_CAPSULE | Freq: Every evening | ORAL | Status: DC

## 2015-04-07 ENCOUNTER — Encounter: Payer: Self-pay | Admitting: Internal Medicine

## 2015-04-07 ENCOUNTER — Ambulatory Visit (INDEPENDENT_AMBULATORY_CARE_PROVIDER_SITE_OTHER): Payer: Medicare Other | Admitting: Internal Medicine

## 2015-04-07 VITALS — BP 120/78 | HR 106 | Ht 72.0 in | Wt 223.0 lb

## 2015-04-07 DIAGNOSIS — J432 Centrilobular emphysema: Secondary | ICD-10-CM | POA: Diagnosis not present

## 2015-04-07 DIAGNOSIS — J90 Pleural effusion, not elsewhere classified: Secondary | ICD-10-CM

## 2015-04-07 NOTE — Patient Instructions (Signed)
See Dr Kem Parkinson about your sore shoulder and whether to change from coumadin to a newer blood thinner, since that was your question.  Order-  future CXR in 3 months with next ov   For dx right pleural effusion  Walking, standing up straight and taking deep breaths will help open your lungs and improve your stamina.

## 2015-04-07 NOTE — Progress Notes (Signed)
Patient ID: [name] [name], male    DOB: Jun 14, 1931, 81 y.o.   MRN: [id number]  HPI  04/06/11- He was unable to continue CPAP. It starts out fine but when he gets up for bathroom at night, then comes back to bed, the mask always leaks and he is unable to tolerate. He is still having to get up 2-3 x / night for bathroom. He disconnects hose and keeps mask on. He thinks the mask warms  and softens with body heat. Problems center around his need to get up frequently, but he denies any cardiac events since last here.   05/16/11- OSA  Doesn't feel much daytime sleepiness, still takes naps and enjoys them. We discussed reasons for continuing CPAP. Mask either leaks or is too tight at times. [name] makes face itch. Discussed alternatives.  11/15/11- 67 yoM former smoker followed for OSA  Has had flu vaccine. Had a hypoglycemic episode earlier today but says he is back in control today, having eaten. Denies cough or wheeze but notices some exertional dyspnea, little changed. He has given up on CPAP and feels he sleeps well. He still wakes 3 or 4 times a night for nocturia and this was part of the problem with CPAP. He takes an afternoon nap most days.  05/15/12- 43 yoM former smoker followed for OSA   Patient states doing good. OSA-he failed CPAP but is using an oral appliance he got from his dentist, Dr. Archie Balboa. Dry mouth from medications and frequent nocturia him up a lot at night and uses CPAP was too hard to manage. We reviewed his last chest x-ray. There is mild cardiac enlargement. He appears to be some fibrosis or possibly interstitial edema but it does not look like an active progressive condition. He denies cough or change in exercise tolerance. CXR - 02/05/12  IMPRESSION:  Stable chronic lung disease. No acute cardiopulmonary process.  Original Report Authenticated By: Vivia Ewing, M.D.   05/16/13- 89 yoM former smoker followed for OSA/ failed CPAP/ Oral appliance FOLLOWS FOR: states he is  sleeping fine, denies any snoring or breathing issues. Denies any wheezing, cough, or congestion. Has occasional SOB with the simplest jobs-walking, restroom,etc). Biotene helped dry mouth. Little coughing. He did not tolerate CPAP and feels stable and comfortable without it, denying significant snoring or sleepiness. PFT 06/04/2012 mild obstructive airways disease in small airways with insignificant response to bronchodilator, normal lung volumes, diffusion slightly reduced. FVC 3.66/82%, FEV1 2.53/88%, FEV1/FEC 0.69, FEF 25-75% 1.40/59%. RV 113%, TLC 98%, DLCO 72%  05/16/14- 13 yoM former smoker followed for OSA/ failed CPAP/ failed Oral appliance, complicated by AFib, Parkinsons, DM FOLLOWS FOR: Pt states that overall breathing has been doing well since last OV x 1 year ago. No complaints.  Now seeing a neurologist for diagnosis of Parkinson's. Sleep-had an oral appliance. Began making his mouth sore, did not help his sleep and he stopped using it. Breathing-doing well. He denies wheeze or cough. PFT 2013 showed mild obstructive airways disease with insignificant response to bronchodilator. He doesn't feel he has an active problem with this. CXR 05/16/13 IMPRESSION:  No acute findings.  Original Report Authenticated By: Lorin Picket, M.D.  12/03/14- 83 yoM former smoker followed for OSA/ failed CPAP/ failed Oral appliance, complicated by AFib,Chronic R CHF, Recurrent R pleural effusion  Parkinsons, DM, Renal Insufficiency    Wife and daughter here FOLLOWS FOR: pt had thoracentesis X2 this past week in the hospital.  Pt currently having increased sob  with exertion. Hospitalized for dyspnea with large right pleural effusion in mid December. Thoracentesis twice, 3 days apart, for 1 L volume removal of exudative fluid with RBCs, no growth and benign cytology. History of trauma several months ago when he fell, breaking his right ribs and hurting his right shoulder. He sleeps on his back, not in right  decubitus which might favor formation of a right effusion. CT without contrast (creatinine 2.39) did not identify a discrete mass. Each thoracentesis relieved shortness of breath for about 3 or 4 days. His wife thinks he needs it done again. Little cough or phlegm, no fever.  CXR 11/29/14  After T'centesis IMPRESSION: Continued small to moderate right pleural fluid collection with interval improvement. No pneumothorax. Electronically Signed  By: Marin Olp M.D.  On: 11/29/2014 13:06  04/07/15- 35 yoM former smoker followed for Recurrent R pleural effusion, OSA/ failed CPAP/ failed Oral appliance, complicated by AFib,Chronic R CHF,   Parkinsons, DM, Renal Insufficiency    Wife and daughter here FOLLOWS FOR: Has completed visits with Dr Prescott Gum; Pt states his breathing is fine but wife has noticed SOB with walking. Right pleural effusion had developed following a fall with right sided rib fractures. Workup was benign and he was treated by Thoracic Surgery. CXR 03/11/15- reviewed by me. Shallow insp and pleural thickening R base IMPRESSION: No reaccumulation of the right pleural effusion is seen. Probable chronic change remains at the right lung base. Electronically Signed  By: Ivar Drape M.D.  On: 03/11/2015 12:01  Review of Systems-See HPI Constitutional:   No-   weight loss, night sweats, fevers, chills, fatigue, lassitude. HEENT:   No-  headaches, difficulty swallowing, tooth/dental problems, sore throat,       No-  sneezing, itching, ear ache, nasal congestion, post nasal drip,  CV:  No-   chest pain, orthopnea, PND, swelling in lower extremities, anasarca, dizziness, palpitations Resp: +  shortness of breath with exertion , not at rest.              No-   productive cough,  No non-productive cough,  No- coughing up of blood.              No-   change in color of mucus.  No- wheezing.   Skin: No-   rash or lesions. GI:  No-   heartburn, indigestion, abdominal pain, nausea,  vomiting,  GU:  MS:  No-   joint pain or swelling.  Neuro-    + tremor Psych:  No- change in mood or affect. No depression or anxiety.  No memory loss.   Objective:   Physical Exam General- Alert, Oriented, Affect-appropriate and responsive, Distress- none acute Skin- rash-none, lesions- none, excoriation- none Lymphadenopathy- none Head- atraumatic            Eyes- Gross vision intact, PERRLA, conjunctivae clear secretions            Ears- Hearing, canals-normal            Nose- Clear, no-Septal dev, mucus, polyps, erosion, perforation             Throat- Mallampati II , mucosa very dry , drainage- none, tonsils- atrophic Neck- flexible , trachea midline, no stridor , thyroid nl, carotid no bruit Chest - symmetrical excursion , unlabored           Heart/CV- + IRR , no murmur , no gallop  , no rub, nl s1 s2                           -  JVD- none , edema- none, stasis changes- none, varices- none           Lung- distant, breath sounds equal, wheeze- none, cough- none , dullness-none, rub- none           Chest wall-  Abd-  Br/ Gen/ Rectal- Not done, not indicated Extrem- cyanosis- none, clubbing, none, atrophy- none, strength- nl, no clubbing. + Cane Neuro- + he holds hands to hide tremor

## 2015-04-08 ENCOUNTER — Ambulatory Visit (INDEPENDENT_AMBULATORY_CARE_PROVIDER_SITE_OTHER): Payer: Medicare Other | Admitting: General Practice

## 2015-04-08 DIAGNOSIS — Z5181 Encounter for therapeutic drug level monitoring: Secondary | ICD-10-CM

## 2015-04-08 LAB — POCT INR: INR: 2

## 2015-04-08 NOTE — Progress Notes (Signed)
Agree with plan 

## 2015-04-08 NOTE — Progress Notes (Signed)
Pre visit review using our clinic review tool, if applicable. No additional management support is needed unless otherwise documented below in the visit note. 

## 2015-04-09 NOTE — Assessment & Plan Note (Signed)
We discussed ways to maintain stamina with exertion-encourage walking, standing up straight, occasional deep breath. Watch need for bronchodilators.

## 2015-04-09 NOTE — Assessment & Plan Note (Signed)
Chest x-ray now shows residual pleural thickening at the right base but no evident fluid. This was probably a traumatic effusion aggravated by his right-sided heart failure. Plan-follow-up chest x-ray in 3 months

## 2015-04-16 ENCOUNTER — Ambulatory Visit: Payer: Medicare Other | Admitting: Neurology

## 2015-04-16 ENCOUNTER — Encounter: Payer: Self-pay | Admitting: Gastroenterology

## 2015-04-24 ENCOUNTER — Other Ambulatory Visit: Payer: Self-pay | Admitting: Internal Medicine

## 2015-04-29 ENCOUNTER — Other Ambulatory Visit: Payer: Self-pay | Admitting: Internal Medicine

## 2015-05-06 ENCOUNTER — Ambulatory Visit (INDEPENDENT_AMBULATORY_CARE_PROVIDER_SITE_OTHER): Payer: Medicare Other | Admitting: Neurology

## 2015-05-06 ENCOUNTER — Encounter: Payer: Self-pay | Admitting: Neurology

## 2015-05-06 ENCOUNTER — Ambulatory Visit (INDEPENDENT_AMBULATORY_CARE_PROVIDER_SITE_OTHER): Payer: Medicare Other | Admitting: *Deleted

## 2015-05-06 ENCOUNTER — Ambulatory Visit: Payer: Medicare Other

## 2015-05-06 VITALS — BP 110/70 | HR 109 | Ht 72.0 in | Wt 222.6 lb

## 2015-05-06 DIAGNOSIS — G20 Parkinson's disease: Secondary | ICD-10-CM

## 2015-05-06 DIAGNOSIS — E0842 Diabetes mellitus due to underlying condition with diabetic polyneuropathy: Secondary | ICD-10-CM

## 2015-05-06 DIAGNOSIS — Z5181 Encounter for therapeutic drug level monitoring: Secondary | ICD-10-CM | POA: Diagnosis not present

## 2015-05-06 DIAGNOSIS — I4891 Unspecified atrial fibrillation: Secondary | ICD-10-CM

## 2015-05-06 DIAGNOSIS — R269 Unspecified abnormalities of gait and mobility: Secondary | ICD-10-CM | POA: Diagnosis not present

## 2015-05-06 LAB — POCT INR: INR: 2.5
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
## 2015-05-06 NOTE — Progress Notes (Signed)
Pre visit review using our clinic review tool, if applicable. No additional management support is needed unless otherwise documented below in the visit note. 

## 2015-05-06 NOTE — Patient Instructions (Signed)
1.  Continue your medications as you are taking them 2.  Should you decide to start Parkinson's disease therapy program, please call my office 3.  Return to clinic in 3 months

## 2015-05-06 NOTE — Progress Notes (Signed)
I have reviewed and agree with the plan. 

## 2015-05-11 ENCOUNTER — Other Ambulatory Visit: Payer: Self-pay | Admitting: Internal Medicine

## 2015-05-20 ENCOUNTER — Ambulatory Visit (INDEPENDENT_AMBULATORY_CARE_PROVIDER_SITE_OTHER): Payer: Medicare Other | Admitting: Internal Medicine

## 2015-05-20 ENCOUNTER — Encounter: Payer: Self-pay | Admitting: Internal Medicine

## 2015-05-20 VITALS — BP 114/60 | HR 94 | Temp 97.7°F | Resp 20 | Ht 72.0 in | Wt 225.0 lb

## 2015-05-20 DIAGNOSIS — E039 Hypothyroidism, unspecified: Secondary | ICD-10-CM

## 2015-05-20 DIAGNOSIS — I480 Paroxysmal atrial fibrillation: Secondary | ICD-10-CM

## 2015-05-20 DIAGNOSIS — G20 Parkinson's disease: Secondary | ICD-10-CM | POA: Diagnosis not present

## 2015-05-20 DIAGNOSIS — E0821 Diabetes mellitus due to underlying condition with diabetic nephropathy: Secondary | ICD-10-CM | POA: Diagnosis not present

## 2015-05-20 DIAGNOSIS — E782 Mixed hyperlipidemia: Secondary | ICD-10-CM

## 2015-05-20 DIAGNOSIS — S81802A Unspecified open wound, left lower leg, initial encounter: Secondary | ICD-10-CM

## 2015-05-20 LAB — HEMOGLOBIN A1C: HEMOGLOBIN A1C: 7 % — AB (ref 4.6–6.5)

## 2015-05-20 MED ORDER — INSULIN GLARGINE 100 UNIT/ML SOLOSTAR PEN: 54.0000 [IU] | PEN_INJECTOR | Freq: Every day | SUBCUTANEOUS | Status: DC

## 2015-05-20 NOTE — Progress Notes (Signed)
Pre visit review using our clinic review tool, if applicable. No additional management support is needed unless otherwise documented below in the visit note. 

## 2015-05-20 NOTE — Patient Instructions (Signed)
Please check your hemoglobin A1c every 3 months  Limit your sodium (Salt) intake
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
## 2015-05-27 ENCOUNTER — Other Ambulatory Visit: Payer: Self-pay | Admitting: Internal Medicine

## 2015-06-03 ENCOUNTER — Ambulatory Visit (INDEPENDENT_AMBULATORY_CARE_PROVIDER_SITE_OTHER): Payer: Medicare Other | Admitting: General Practice

## 2015-06-03 ENCOUNTER — Telehealth: Payer: Self-pay | Admitting: Internal Medicine

## 2015-06-03 DIAGNOSIS — Z5181 Encounter for therapeutic drug level monitoring: Secondary | ICD-10-CM | POA: Diagnosis not present

## 2015-06-03 DIAGNOSIS — I4891 Unspecified atrial fibrillation: Secondary | ICD-10-CM

## 2015-06-03 LAB — POCT INR: INR: 2.7

## 2015-06-03 NOTE — Telephone Encounter (Signed)
Spoke to pt's wife Baker Janus, she said pt has had a rough time, falling a lot and his chair is not working anymore that he sleeps in, would like a bed. Told Gail need to schedule Face to Face for Home Health referral to get help and a bed. Baker Janus verbalized understanding. Transferred to scheduling.

## 2015-06-03 NOTE — Progress Notes (Signed)
Pre visit review using our clinic review tool, if applicable. No additional management support is needed unless otherwise documented below in the visit note. 

## 2015-06-03 NOTE — Telephone Encounter (Signed)
Pt wife call and would like a call back. She said her husband has a lot going on and she has some questions

## 2015-06-05 ENCOUNTER — Encounter: Payer: Self-pay | Admitting: Internal Medicine

## 2015-06-05 ENCOUNTER — Ambulatory Visit (INDEPENDENT_AMBULATORY_CARE_PROVIDER_SITE_OTHER): Payer: Medicare Other | Admitting: Internal Medicine

## 2015-06-05 VITALS — BP 110/64 | HR 91 | Temp 97.7°F | Wt 223.0 lb

## 2015-06-05 DIAGNOSIS — G20 Parkinson's disease: Secondary | ICD-10-CM

## 2015-06-05 DIAGNOSIS — G4733 Obstructive sleep apnea (adult) (pediatric): Secondary | ICD-10-CM | POA: Diagnosis not present

## 2015-06-05 DIAGNOSIS — I4819 Other persistent atrial fibrillation: Secondary | ICD-10-CM

## 2015-06-05 DIAGNOSIS — I481 Persistent atrial fibrillation: Secondary | ICD-10-CM

## 2015-06-05 DIAGNOSIS — J90 Pleural effusion, not elsewhere classified: Secondary | ICD-10-CM

## 2015-06-05 NOTE — Patient Instructions (Signed)
Limit your sodium (Salt) intake  Return in 3 months for follow-up  Neurology follow-up as scheduled  Pulmonary follow-up as scheduled

## 2015-06-05 NOTE — Progress Notes (Signed)
Subjective:    Patient ID: [name] [name], male    DOB: Feb 10, 1931, 81 y.o.   MRN: [id number]  HPI 81 year old patient who is seen today for a face to face evaluation to determine need for a hospital bed. Patient has a history of advanced parkinsonism, which has caused a severe gait abnormality.  He has frequent falls. Due to his parkinsonism, he has a difficult time with handling oral secretions and is a high aspiration risk.  He does have frequent episodes of coughing, especially postprandially. Due to his immobility, aggressive measures are needed to prevent major sacral decubiti.  He requires frequent repositioning. He has a history of COPD with pulmonary hypertension and breathing is much improved when the head of the bed is elevated.  Raising the head of the bed will greatly reduce his aspiration risk.  Past Medical History  Diagnosis Date  . Family history of malignant neoplasm of gastrointestinal tract   . Unspecified constipation   . Abnormal involuntary movements(781.0)   . Vitamin B12 deficiency   . Claudication   . Hypertrophy of prostate with urinary obstruction and other lower urinary tract symptoms (LUTS)   . Obesity   . Mixed hyperlipidemia   . Atrial fibrillation     --myoview 07/2006: not gated. No ischemia or infarct  Diastolic HF-- Echo 05/3148 ER60% mild AI/MR. RV mild to moderately dilated/ dysfunction. ? restrictive CM . RVSP 36  . Bladder polyps   . Kidney stones   . Unspecified sleep apnea     NPSG 05/14/2007- AHI 80.7/ hr  . OSA (obstructive sleep apnea)     "tried mask; couldn't use it; so I quit" (11/12/2014)  . Hypothyroidism   . History of hiatal hernia   . Daily headache     "I've had headaches all my life; get them almost daily" (11/12/2014)  . Stroke ~ 1985    "simple stroke" denies residual on 11/12/2014  . Arthritis     "just about qwhere"   . Pleural effusion 11/12/2014 admission  . Dysrhythmia     Afib  . Type II or unspecified type diabetes  mellitus with neurological manifestations, not stated as uncontrolled   . Shortness of breath dyspnea     walking distance or climbing stairs  . Neuromuscular disorder     Parkinson's / age 36 had growth per left shoulder/neck area that stopped blood flow to left arm was removed and has not no further problems.    History   Social History  . Marital Status: Married    Spouse Name: N/A  . Number of Children: 3  . Years of Education: 16   Occupational History  . entrepenure    Social History Main Topics  . Smoking status: Former Smoker -- 1.00 packs/day for 35 years    Types: Cigarettes    Quit date: 12/05/1985  . Smokeless tobacco: Never Used  . Alcohol Use: No  . Drug Use: No  . Sexual Activity: Not on file   Other Topics Concern  . Not on file   Social History Narrative   Married '55. 1 son-'64; 3 daughters- '54, '60, '62. 3 grandaughters. 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 Appendectomy    . Kidney stone surgery  Kidney stone retrieval with uretal stent x 2   . Bladder polyps      many times  last time 12/15  . Transurethral resection of bladder    . Orif elbow fracture Left 1976  . Fracture surgery    . Excisional hemorrhoidectomy  1950's  . Ureteral stent placement  X 1  . Cystoscopy w/ stone manipulation  X 2  . Cataract extraction w/ intraocular lens  implant, bilateral Bilateral   . Eye surgery Bilateral     catarct  . Chest tube insertion Right 12/12/2014    Procedure: INSERTION PLEURAL DRAINAGE CATHETER;  Surgeon: Ivin Poot, MD;  Location: [facility];  Service: Thoracic;  Laterality: Right;  . Colonscopy       removal of polyps  . Cystoscopy N/A 01/15/2015    Procedure: CYSTOSCOPY;  Surgeon: Bernestine Amass, MD;  Location: [facility];  Service: Urology;  Laterality: N/A;  .  Transurethral resection of bladder tumor N/A 01/15/2015    Procedure: TRANSURETHRAL RESECTION OF BLADDER TUMOR (TURBT);  Surgeon: Bernestine Amass, MD;  Location: [facility];  Service: Urology;  Laterality: N/A;  . Removal of pleural drainage catheter Right 02/23/2015    Procedure: REMOVAL OF PLEURAL DRAINAGE CATHETER;  Surgeon: Ivin Poot, MD;  Location: [facility];  Service: Thoracic;  Laterality: Right;    Family History  Problem Relation Age of Onset  . Lung cancer Mother 12    nonsmoker  . Colon cancer Sister   . Prostate cancer Paternal Uncle   . Stroke Father   . Hip fracture Father   . Healthy Son   . Healthy Daughter     Allergies  Allergen Reactions  . Penicillins Rash    Happened 60 years ago    Current Outpatient Prescriptions on File Prior to Visit  Medication Sig Dispense Refill  . acetaminophen (TYLENOL) 500 MG tablet Take 500 mg by mouth every 6 (six) hours as needed for mild pain.    . carbidopa-levodopa (SINEMET IR) 25-100 MG per tablet Take 1 tablet by mouth 3 (three) times daily. 270 tablet 3  . Cyanocobalamin (VITAMIN B 12 PO) Take 1 tablet by mouth daily.    . furosemide (LASIX) 20 MG tablet 1 tablet Monday, Wednesday, Friday only (Patient taking differently: Take 20 mg by mouth every Monday, Wednesday, and Friday. 1 tablet Monday, Wednesday, Friday only) 90 tablet 3  . gabapentin (NEURONTIN) 100 MG capsule Take 2 capsules (200 mg total) by mouth every evening. 1 at bedtime. 60 capsule 3  . glucose blood test strip 1 each by Other route daily as needed for other. 100 each 12  . Insulin Glargine (LANTUS SOLOSTAR) 100 UNIT/ML Solostar Pen Inject 54 Units into the skin daily at 10 pm. 15 mL 1  . Insulin Pen Needle (PEN NEEDLES 31GX5/16") 31G X 8 MM MISC 1 each by Does not apply route as directed. 100 each 1  . Lancets (ONETOUCH ULTRASOFT) lancets 1 each by Other route daily as needed for other. 100 each 12  . levothyroxine (SYNTHROID, LEVOTHROID) 100 MCG tablet TAKE 1  TABLET BY MOUTH ONCE DAILY 90 tablet 1  . lovastatin (MEVACOR) 20 MG tablet TAKE 1 TABLET BY MOUTH DAILY 30 tablet 5  . metoprolol (TOPROL-XL) 100 MG 24 hr tablet 1 half tablet daily (Patient taking differently: Take 50 mg by mouth every morning. 1 half tablet daily) 90 tablet 3  . Polyethyl Glycol-Propyl Glycol 0.4-0.3 % SOLN Apply 1 drop to eye daily  as needed (dry eye).    . polyethylene glycol (MIRALAX) powder Take 17 g by mouth at bedtime. For regularity    . sertraline (ZOLOFT) 25 MG tablet Take 1 tablet (25 mg total) by mouth daily. 30 tablet 11  . Tamsulosin HCl (FLOMAX) 0.4 MG CAPS Take 0.4 mg by mouth daily.     [name] warfarin (COUMADIN) 5 MG tablet Take 5 mg by mouth daily. 2.5mg  on Sun/Tues./Fri's     No current facility-administered medications on file prior to visit.    BP 110/64 mmHg  Pulse 91  Temp(Src) 97.7 F (36.5 C) (Oral)  Wt 223 lb (101.152 kg)  SpO2 91%      Review of Systems  Constitutional: Positive for activity change, appetite change and fatigue. Negative for fever and chills.  HENT: Positive for drooling. Negative for congestion, dental problem, ear pain, hearing loss, sore throat, tinnitus, trouble swallowing and voice change.   Eyes: Negative for pain, discharge and visual disturbance.  Respiratory: Positive for cough. Negative for chest tightness, wheezing and stridor.   Cardiovascular: Negative for chest pain, palpitations and leg swelling.  Gastrointestinal: Negative for nausea, vomiting, abdominal pain, diarrhea, constipation, blood in stool and abdominal distention.  Genitourinary: Negative for urgency, hematuria, flank pain, discharge, difficulty urinating and genital sores.  Musculoskeletal: Positive for back pain and gait problem. Negative for myalgias, joint swelling, arthralgias and neck stiffness.  Skin: Positive for rash.  Neurological: Positive for weakness. Negative for dizziness, syncope, speech difficulty, numbness and headaches.    Hematological: Negative for adenopathy. Does not bruise/bleed easily.  Psychiatric/Behavioral: Negative for behavioral problems and dysphoric mood. The patient is not nervous/anxious.        Objective:   Physical Exam  Constitutional: He is oriented to person, place, and time. He appears well-developed.  Requires assistance to stand from a sitting position  Blood pressure low normal Afebrile  HENT:  Head: Normocephalic.  Right Ear: External ear normal.  Left Ear: External ear normal.  Eyes: Conjunctivae and EOM are normal.  Neck: Normal range of motion.  Cardiovascular: Normal rate and normal heart sounds.   Pulmonary/Chest: Breath sounds normal.  Abdominal: Bowel sounds are normal.  Musculoskeletal: Normal range of motion. He exhibits no edema or tenderness.  Neurological: He is alert and oriented to person, place, and time.  Skin:  Ecchymoses over the left upper anterior abdominal area Erythema in the sacral and intergluteal area 2 cm stage II pressure sore involving the left buttock area  Psychiatric: He has a normal mood and affect. His behavior is normal.          Assessment & Plan:   Parkinson's disease with aspiration risk and severe mobility issues. High risk for decubiti  We'll order hospital bed to minimize complications Paroxysmal atrial fibrillation Chronic right-sided heart failure Long term anticoagulation

## 2015-06-05 NOTE — Progress Notes (Signed)
Pre visit review using our clinic review tool, if applicable. No additional management support is needed unless otherwise documented below in the visit note. 

## 2015-06-08 NOTE — Progress Notes (Signed)
I have reviewed and agree with the plan. 

## 2015-06-11 ENCOUNTER — Telehealth: Payer: Self-pay | Admitting: Internal Medicine

## 2015-06-11 DIAGNOSIS — G20 Parkinson's disease: Secondary | ICD-10-CM

## 2015-06-11 DIAGNOSIS — I50812 Chronic right heart failure: Secondary | ICD-10-CM

## 2015-06-11 NOTE — Telephone Encounter (Signed)
Left detailed message on personal voicemail that hospital bed was ordered today. Sorry was not done when in for visit. Someone will be in touch with you. Any questions please call the office.  Contacted Melissa at [facility] and told her put order in for Hospital bed for pt and needs as soon as possible and note is in EP[facility]. Melissa said she will check and let me know if she needs anything else. Told her okay.

## 2015-06-11 NOTE — Telephone Encounter (Signed)
Daughter would like an update on hospital bed asap.  She is at her parents right now  thanks

## 2015-06-17 ENCOUNTER — Other Ambulatory Visit: Payer: Self-pay | Admitting: Internal Medicine

## 2015-06-23 ENCOUNTER — Inpatient Hospital Stay (HOSPITAL_COMMUNITY)
Admission: EM | Admit: 2015-06-23 | Discharge: 2015-06-26 | DRG: 689 | Disposition: A | Discharge status: Home Health | Payer: Medicare Other | Attending: Internal Medicine | Admitting: Internal Medicine

## 2015-06-23 ENCOUNTER — Encounter (HOSPITAL_COMMUNITY): Payer: Self-pay | Admitting: Emergency Medicine

## 2015-06-23 ENCOUNTER — Emergency Department (HOSPITAL_COMMUNITY): Payer: Medicare Other

## 2015-06-23 ENCOUNTER — Telehealth: Payer: Self-pay | Admitting: *Deleted

## 2015-06-23 DIAGNOSIS — Z823 Family history of stroke: Secondary | ICD-10-CM | POA: Diagnosis not present

## 2015-06-23 DIAGNOSIS — D631 Anemia in chronic kidney disease: Secondary | ICD-10-CM | POA: Diagnosis present

## 2015-06-23 DIAGNOSIS — Z86010 Personal history of colonic polyps: Secondary | ICD-10-CM

## 2015-06-23 DIAGNOSIS — Z880 Allergy status to penicillin: Secondary | ICD-10-CM | POA: Diagnosis not present

## 2015-06-23 DIAGNOSIS — J189 Pneumonia, unspecified organism: Secondary | ICD-10-CM | POA: Diagnosis present

## 2015-06-23 DIAGNOSIS — E1149 Type 2 diabetes mellitus with other diabetic neurological complication: Secondary | ICD-10-CM | POA: Diagnosis present

## 2015-06-23 DIAGNOSIS — Z6830 Body mass index (BMI) 30.0-30.9, adult: Secondary | ICD-10-CM | POA: Diagnosis not present

## 2015-06-23 DIAGNOSIS — R531 Weakness: Secondary | ICD-10-CM

## 2015-06-23 DIAGNOSIS — E1122 Type 2 diabetes mellitus with diabetic chronic kidney disease: Secondary | ICD-10-CM | POA: Diagnosis present

## 2015-06-23 DIAGNOSIS — Z8673 Personal history of transient ischemic attack (TIA), and cerebral infarction without residual deficits: Secondary | ICD-10-CM | POA: Diagnosis not present

## 2015-06-23 DIAGNOSIS — I129 Hypertensive chronic kidney disease with stage 1 through stage 4 chronic kidney disease, or unspecified chronic kidney disease: Secondary | ICD-10-CM | POA: Diagnosis present

## 2015-06-23 DIAGNOSIS — E538 Deficiency of other specified B group vitamins: Secondary | ICD-10-CM | POA: Diagnosis present

## 2015-06-23 DIAGNOSIS — R51 Headache: Secondary | ICD-10-CM | POA: Diagnosis present

## 2015-06-23 DIAGNOSIS — I872 Venous insufficiency (chronic) (peripheral): Secondary | ICD-10-CM | POA: Diagnosis present

## 2015-06-23 DIAGNOSIS — R1310 Dysphagia, unspecified: Secondary | ICD-10-CM

## 2015-06-23 DIAGNOSIS — Z87442 Personal history of urinary calculi: Secondary | ICD-10-CM | POA: Diagnosis not present

## 2015-06-23 DIAGNOSIS — N400 Enlarged prostate without lower urinary tract symptoms: Secondary | ICD-10-CM | POA: Diagnosis present

## 2015-06-23 DIAGNOSIS — L89312 Pressure ulcer of right buttock, stage 2: Secondary | ICD-10-CM | POA: Diagnosis present

## 2015-06-23 DIAGNOSIS — Z794 Long term (current) use of insulin: Secondary | ICD-10-CM | POA: Diagnosis not present

## 2015-06-23 DIAGNOSIS — I4891 Unspecified atrial fibrillation: Secondary | ICD-10-CM | POA: Diagnosis present

## 2015-06-23 DIAGNOSIS — E782 Mixed hyperlipidemia: Secondary | ICD-10-CM | POA: Diagnosis present

## 2015-06-23 DIAGNOSIS — L8990 Pressure ulcer of unspecified site, unspecified stage: Secondary | ICD-10-CM | POA: Insufficient documentation

## 2015-06-23 DIAGNOSIS — E039 Hypothyroidism, unspecified: Secondary | ICD-10-CM | POA: Diagnosis present

## 2015-06-23 DIAGNOSIS — R0789 Other chest pain: Secondary | ICD-10-CM

## 2015-06-23 DIAGNOSIS — E669 Obesity, unspecified: Secondary | ICD-10-CM | POA: Diagnosis present

## 2015-06-23 DIAGNOSIS — N390 Urinary tract infection, site not specified: Secondary | ICD-10-CM | POA: Diagnosis present

## 2015-06-23 DIAGNOSIS — Z87891 Personal history of nicotine dependence: Secondary | ICD-10-CM | POA: Diagnosis not present

## 2015-06-23 DIAGNOSIS — D638 Anemia in other chronic diseases classified elsewhere: Secondary | ICD-10-CM

## 2015-06-23 DIAGNOSIS — R479 Unspecified speech disturbances: Secondary | ICD-10-CM

## 2015-06-23 DIAGNOSIS — R0781 Pleurodynia: Secondary | ICD-10-CM

## 2015-06-23 DIAGNOSIS — Z79899 Other long term (current) drug therapy: Secondary | ICD-10-CM | POA: Diagnosis not present

## 2015-06-23 DIAGNOSIS — B964 Proteus (mirabilis) (morganii) as the cause of diseases classified elsewhere: Secondary | ICD-10-CM | POA: Diagnosis present

## 2015-06-23 DIAGNOSIS — G4733 Obstructive sleep apnea (adult) (pediatric): Secondary | ICD-10-CM | POA: Diagnosis present

## 2015-06-23 DIAGNOSIS — G20 Parkinson's disease: Secondary | ICD-10-CM | POA: Diagnosis present

## 2015-06-23 DIAGNOSIS — R059 Cough, unspecified: Secondary | ICD-10-CM

## 2015-06-23 DIAGNOSIS — Z906 Acquired absence of other parts of urinary tract: Secondary | ICD-10-CM | POA: Diagnosis present

## 2015-06-23 DIAGNOSIS — M1389 Other specified arthritis, multiple sites: Secondary | ICD-10-CM | POA: Diagnosis present

## 2015-06-23 DIAGNOSIS — G20A1 Parkinson's disease without dyskinesia, without mention of fluctuations: Secondary | ICD-10-CM | POA: Diagnosis present

## 2015-06-23 DIAGNOSIS — Z7901 Long term (current) use of anticoagulants: Secondary | ICD-10-CM

## 2015-06-23 DIAGNOSIS — N184 Chronic kidney disease, stage 4 (severe): Secondary | ICD-10-CM | POA: Diagnosis present

## 2015-06-23 DIAGNOSIS — R05 Cough: Secondary | ICD-10-CM

## 2015-06-23 LAB — COMPREHENSIVE METABOLIC PANEL
ALBUMIN: 3 g/dL — AB (ref 3.5–5.0)
ALT: 7 U/L — ABNORMAL LOW (ref 17–63)
AST: 25 U/L (ref 15–41)
Alkaline Phosphatase: 238 U/L — ABNORMAL HIGH (ref 38–126)
Anion gap: 7 (ref 5–15)
BILIRUBIN TOTAL: 0.8 mg/dL (ref 0.3–1.2)
BUN: 69 mg/dL — AB (ref 6–20)
CHLORIDE: 105 mmol/L (ref 101–111)
CO2: 29 mmol/L (ref 22–32)
CREATININE: 2.65 mg/dL — AB (ref 0.61–1.24)
Calcium: 9.9 mg/dL (ref 8.9–10.3)
GFR calc Af Amer: 24 mL/min — ABNORMAL LOW (ref >60)
GFR calc non Af Amer: 21 mL/min — ABNORMAL LOW (ref >60)
Glucose, Bld: 163 mg/dL — ABNORMAL HIGH (ref 65–99)
Potassium: 4.4 mmol/L (ref 3.5–5.1)
SODIUM: 141 mmol/L (ref 135–145)
Total Protein: 7.2 g/dL (ref 6.5–8.1)

## 2015-06-23 LAB — URINALYSIS, ROUTINE W REFLEX MICROSCOPIC
Bilirubin Urine: NEGATIVE
GLUCOSE, UA: NEGATIVE mg/dL
Ketones, ur: NEGATIVE mg/dL
Nitrite: POSITIVE — AB
PH: 7 (ref 5.0–8.0)
Protein, ur: 30 mg/dL — AB
Specific Gravity, Urine: 1.017 (ref 1.005–1.030)
Urobilinogen, UA: 0.2 mg/dL (ref 0.0–1.0)

## 2015-06-23 LAB — CBC WITH DIFFERENTIAL/PLATELET
BASOS ABS: 0 10*3/uL (ref 0.0–0.1)
Basophils Relative: 0 % (ref 0–1)
EOS ABS: 0.2 10*3/uL (ref 0.0–0.7)
Eosinophils Relative: 3 % (ref 0–5)
HEMATOCRIT: 31.9 % — AB (ref 39.0–52.0)
HEMOGLOBIN: 9.9 g/dL — AB (ref 13.0–17.0)
Lymphocytes Relative: 22 % (ref 12–46)
Lymphs Abs: 1.3 10*3/uL (ref 0.7–4.0)
MCH: 27.7 pg (ref 26.0–34.0)
MCHC: 31 g/dL (ref 30.0–36.0)
MCV: 89.4 fL (ref 78.0–100.0)
Monocytes Absolute: 0.4 10*3/uL (ref 0.1–1.0)
Monocytes Relative: 7 % (ref 3–12)
NEUTROS PCT: 68 % (ref 43–77)
Neutro Abs: 4.2 10*3/uL (ref 1.7–7.7)
PLATELETS: 210 10*3/uL (ref 150–400)
RBC: 3.57 MIL/uL — AB (ref 4.22–5.81)
RDW: 16.7 % — ABNORMAL HIGH (ref 11.5–15.5)
WBC: 6.2 10*3/uL (ref 4.0–10.5)

## 2015-06-23 LAB — PROTIME-INR
INR: 2.54 — ABNORMAL HIGH (ref 0.00–1.49)
PROTHROMBIN TIME: 27 s — AB (ref 11.6–15.2)

## 2015-06-23 LAB — URINE MICROSCOPIC-ADD ON

## 2015-06-23 LAB — TSH: TSH: 5.417 u[IU]/mL — AB (ref 0.350–4.500)

## 2015-06-23 LAB — I-STAT TROPONIN, ED: Troponin i, poc: 0.01 ng/mL (ref 0.00–0.08)

## 2015-06-23 LAB — GLUCOSE, CAPILLARY: GLUCOSE-CAPILLARY: 143 mg/dL — AB (ref 65–99)

## 2015-06-23 LAB — VITAMIN B12: Vitamin B-12: 3432 pg/mL — ABNORMAL HIGH (ref 180–914)

## 2015-06-23 MED ORDER — SODIUM CHLORIDE 0.9 % IV SOLN
INTRAVENOUS | Status: DC
  Administered 2015-06-23: 75 mL/h via INTRAVENOUS

## 2015-06-23 MED ORDER — INSULIN GLARGINE 100 UNIT/ML SOLOSTAR PEN: 54.0000 [IU] | PEN_INJECTOR | Freq: Every day | SUBCUTANEOUS | Status: DC

## 2015-06-23 MED ORDER — DIAZEPAM 2 MG PO TABS
2.0000 mg | ORAL_TABLET | Freq: Four times a day (QID) | ORAL | Status: DC | PRN
  Administered 2015-06-24 – 2015-06-25 (×2): 2 mg via ORAL
  Filled 2015-06-23 (×2): qty 1

## 2015-06-23 MED ORDER — ACETAMINOPHEN 650 MG RE SUPP: 650.0000 mg | Freq: Four times a day (QID) | RECTAL | Status: DC | PRN

## 2015-06-23 MED ORDER — INSULIN ASPART 100 UNIT/ML ~~LOC~~ SOLN
4.0000 [IU] | Freq: Three times a day (TID) | SUBCUTANEOUS | Status: DC
  Administered 2015-06-24 – 2015-06-26 (×6): 4 [IU] via SUBCUTANEOUS

## 2015-06-23 MED ORDER — SERTRALINE HCL 25 MG PO TABS
25.0000 mg | ORAL_TABLET | Freq: Every day | ORAL | Status: DC
  Administered 2015-06-24 – 2015-06-26 (×3): 25 mg via ORAL
  Filled 2015-06-23 (×5): qty 1

## 2015-06-23 MED ORDER — WARFARIN - PHARMACIST DOSING INPATIENT: Freq: Every day | Status: DC

## 2015-06-23 MED ORDER — WARFARIN SODIUM 2.5 MG PO TABS
2.5000 mg | ORAL_TABLET | Freq: Once | ORAL | Status: AC
  Administered 2015-06-23: 2.5 mg via ORAL
  Filled 2015-06-23: qty 1

## 2015-06-23 MED ORDER — METOPROLOL SUCCINATE ER 50 MG PO TB24
50.0000 mg | ORAL_TABLET | Freq: Every morning | ORAL | Status: DC
  Administered 2015-06-24 – 2015-06-26 (×3): 50 mg via ORAL
  Filled 2015-06-23 (×4): qty 1

## 2015-06-23 MED ORDER — INSULIN ASPART 100 UNIT/ML ~~LOC~~ SOLN
0.0000 [IU] | Freq: Three times a day (TID) | SUBCUTANEOUS | Status: DC
  Administered 2015-06-24 – 2015-06-25 (×2): 1 [IU] via SUBCUTANEOUS
  Administered 2015-06-26 (×2): 2 [IU] via SUBCUTANEOUS

## 2015-06-23 MED ORDER — TRAMADOL HCL 50 MG PO TABS
50.0000 mg | ORAL_TABLET | Freq: Four times a day (QID) | ORAL | Status: DC | PRN
  Administered 2015-06-24 – 2015-06-25 (×2): 50 mg via ORAL
  Filled 2015-06-23 (×2): qty 1

## 2015-06-23 MED ORDER — ONDANSETRON HCL 4 MG PO TABS: 4.0000 mg | ORAL_TABLET | Freq: Four times a day (QID) | ORAL | Status: DC | PRN

## 2015-06-23 MED ORDER — PRAVASTATIN SODIUM 10 MG PO TABS
10.0000 mg | ORAL_TABLET | Freq: Every day | ORAL | Status: DC
  Administered 2015-06-23 – 2015-06-25 (×3): 10 mg via ORAL
  Filled 2015-06-23 (×4): qty 1

## 2015-06-23 MED ORDER — INSULIN GLARGINE 100 UNIT/ML ~~LOC~~ SOLN
54.0000 [IU] | Freq: Every day | SUBCUTANEOUS | Status: DC
  Administered 2015-06-23: 54 [IU] via SUBCUTANEOUS
  Filled 2015-06-23 (×3): qty 0.54

## 2015-06-23 MED ORDER — CEFTRIAXONE SODIUM IN DEXTROSE 20 MG/ML IV SOLN
1.0000 g | INTRAVENOUS | Status: DC
  Administered 2015-06-24 – 2015-06-25 (×2): 1 g via INTRAVENOUS
  Filled 2015-06-23 (×3): qty 50

## 2015-06-23 MED ORDER — POLYETHYLENE GLYCOL 3350 17 GM/SCOOP PO POWD
17.0000 g | Freq: Every day | ORAL | Status: DC
  Filled 2015-06-23: qty 255

## 2015-06-23 MED ORDER — CARBIDOPA-LEVODOPA ER 25-100 MG PO TBCR
1.0000 | EXTENDED_RELEASE_TABLET | ORAL | Status: AC
  Administered 2015-06-23: 1 via ORAL
  Filled 2015-06-23: qty 1

## 2015-06-23 MED ORDER — DEXTROSE 5 % IV SOLN
1.0000 g | Freq: Once | INTRAVENOUS | Status: AC
  Administered 2015-06-23: 1 g via INTRAVENOUS
  Filled 2015-06-23: qty 10

## 2015-06-23 MED ORDER — POLYETHYLENE GLYCOL 3350 17 G PO PACK
17.0000 g | PACK | Freq: Every day | ORAL | Status: DC
  Administered 2015-06-23 – 2015-06-25 (×3): 17 g via ORAL
  Filled 2015-06-23 (×3): qty 1

## 2015-06-23 MED ORDER — SENNOSIDES-DOCUSATE SODIUM 8.6-50 MG PO TABS: 1.0000 | ORAL_TABLET | Freq: Every evening | ORAL | Status: DC | PRN

## 2015-06-23 MED ORDER — LEVOTHYROXINE SODIUM 100 MCG PO TABS
100.0000 ug | ORAL_TABLET | Freq: Every day | ORAL | Status: DC
  Administered 2015-06-24 – 2015-06-26 (×3): 100 ug via ORAL
  Filled 2015-06-23 (×4): qty 1

## 2015-06-23 MED ORDER — CARBIDOPA-LEVODOPA 25-100 MG PO TABS: 1.0000 | ORAL_TABLET | Freq: Three times a day (TID) | ORAL | Status: DC

## 2015-06-23 MED ORDER — ACETAMINOPHEN 325 MG PO TABS: 650.0000 mg | ORAL_TABLET | Freq: Four times a day (QID) | ORAL | Status: DC | PRN

## 2015-06-23 MED ORDER — TAMSULOSIN HCL 0.4 MG PO CAPS
0.4000 mg | ORAL_CAPSULE | Freq: Every day | ORAL | Status: DC
  Administered 2015-06-24 – 2015-06-26 (×3): 0.4 mg via ORAL
  Filled 2015-06-23 (×4): qty 1

## 2015-06-23 MED ORDER — ONDANSETRON HCL 4 MG/2ML IJ SOLN: 4.0000 mg | Freq: Four times a day (QID) | INTRAMUSCULAR | Status: DC | PRN

## 2015-06-23 MED ORDER — FUROSEMIDE 20 MG PO TABS
20.0000 mg | ORAL_TABLET | ORAL | Status: DC
  Administered 2015-06-24 – 2015-06-26 (×2): 20 mg via ORAL
  Filled 2015-06-23 (×4): qty 1

## 2015-06-23 MED ORDER — CARBIDOPA-LEVODOPA 25-100 MG PO TABS
1.5000 | ORAL_TABLET | Freq: Three times a day (TID) | ORAL | Status: DC
  Administered 2015-06-23 – 2015-06-26 (×8): 1.5 via ORAL
  Filled 2015-06-23: qty 2
  Filled 2015-06-23: qty 1.5
  Filled 2015-06-23: qty 2
  Filled 2015-06-23 (×3): qty 1.5
  Filled 2015-06-23: qty 2
  Filled 2015-06-23 (×5): qty 1.5

## 2015-06-23 MED ORDER — GABAPENTIN 100 MG PO CAPS
100.0000 mg | ORAL_CAPSULE | Freq: Two times a day (BID) | ORAL | Status: DC
  Administered 2015-06-23 – 2015-06-26 (×6): 100 mg via ORAL
  Filled 2015-06-23 (×9): qty 1

## 2015-06-23 NOTE — Telephone Encounter (Signed)
Patients wife call states he has had a major decline patient is unable to stand alone. Please call ASAP Edd Fabian call back number [phone]

## 2015-06-23 NOTE — H&P (Signed)
[facility]          History and Physical    PCP:   Nyoka Cowden, MD   EDP: Aline Brochure, M.D.  Chief Complaint:  Increased tremors, weakness  HPI: Patient is a very pleasant 81 year old gentleman with history of Parkinson's disease, atrial fibrillation maintained on chronic anticoagulation with Coumadin, diabetes, hypertension, hypothyroidism among other comorbidities, who presents to the hospital today with the above-mentioned. Patient states that over the past 24 hours he has had increased tremors he states that even his tongue has been affected as having a little bit of difficulty swallowing that he says is because his "tongue won't stay still". He called his neurologist recommended he come to the hospital today for evaluation. In the ED he is found to have a urinary tract infection, a small infiltrate on chest x-ray that cannot exclude pneumonia. We are asked to admit him for further evaluation and management.  Allergies:   Allergies  Allergen Reactions  . Penicillins Rash    Happened 60 years ago      Past Medical History  Diagnosis Date  . Family history of malignant neoplasm of gastrointestinal tract   . Unspecified constipation   . Abnormal involuntary movements(781.0)   . Vitamin B12 deficiency   . Claudication   . Hypertrophy of prostate with urinary obstruction and other lower urinary tract symptoms (LUTS)   . Obesity   . Mixed hyperlipidemia   . Atrial fibrillation     --myoview 07/2006: not gated. No ischemia or infarct  Diastolic HF-- Echo 12/2456 ER60% mild AI/MR. RV mild to moderately dilated/ dysfunction. ? restrictive CM . RVSP 36  . Bladder polyps   . Kidney stones   . Unspecified sleep apnea     NPSG 05/14/2007- AHI 80.7/ hr  . OSA (obstructive sleep apnea)     "tried mask; couldn't use it; so I quit" (11/12/2014)  . Hypothyroidism   . History of hiatal hernia   . Daily headache     "I've had headaches all my life; get  them almost daily" (11/12/2014)  . Stroke ~ 1985    "simple stroke" denies residual on 11/12/2014  . Arthritis     "just about qwhere"   . Pleural effusion 11/12/2014 admission  . Dysrhythmia     Afib  . Type II or unspecified type diabetes mellitus with neurological manifestations, not stated as uncontrolled   . Shortness of breath dyspnea     walking distance or climbing stairs  . Neuromuscular disorder     Parkinson's / age 87 had growth per left shoulder/neck area that stopped blood flow to left arm was removed and has not no further problems.    Past Surgical History  Procedure Laterality Date  . Appendectomy    . Kidney stone surgery      Kidney stone retrieval with uretal stent x 2   . Bladder polyps      many times  last time 12/15  . Transurethral resection of bladder    . Orif elbow fracture Left 1976  . Fracture surgery    . Excisional hemorrhoidectomy  1950's  . Ureteral stent placement  X 1  . Cystoscopy w/ stone manipulation  X 2  . Cataract extraction w/ intraocular lens  implant, bilateral Bilateral   . Eye surgery Bilateral     catarct  . Chest tube insertion Right 12/12/2014    Procedure: INSERTION PLEURAL  DRAINAGE CATHETER;  Surgeon: Ivin Poot, MD;  Location: [facility];  Service: Thoracic;  Laterality: Right;  . Colonscopy       removal of polyps  . Cystoscopy N/A 01/15/2015    Procedure: CYSTOSCOPY;  Surgeon: Bernestine Amass, MD;  Location: [facility];  Service: Urology;  Laterality: N/A;  . Transurethral resection of bladder tumor N/A 01/15/2015    Procedure: TRANSURETHRAL RESECTION OF BLADDER TUMOR (TURBT);  Surgeon: Bernestine Amass, MD;  Location: [facility];  Service: Urology;  Laterality: N/A;  . Removal of pleural drainage catheter Right 02/23/2015    Procedure: REMOVAL OF PLEURAL DRAINAGE CATHETER;  Surgeon: Ivin Poot, MD;  Location: [facility];  Service: Thoracic;  Laterality: Right;    Prior to Admission medications   Medication Sig Start Date End Date  Taking? Authorizing Provider  acetaminophen (TYLENOL) 500 MG tablet Take 500 mg by mouth every 6 (six) hours as needed for mild pain.   Yes Historical Provider, MD  carbidopa-levodopa (SINEMET IR) 25-100 MG per tablet Take 1 tablet by mouth 3 (three) times daily. 01/07/15  Yes Donika K Patel, DO  Cyanocobalamin (VITAMIN B 12 PO) Take 1 tablet by mouth daily.   Yes Historical Provider, MD  diazepam (VALIUM) 2 MG tablet Take 2 mg by mouth every 6 (six) hours as needed for anxiety.   Yes Historical Provider, MD  furosemide (LASIX) 20 MG tablet 1 tablet Monday, Wednesday, Friday only Patient taking differently: Take 20 mg by mouth every Monday, Wednesday, and Friday. 1 tablet Monday, Wednesday, Friday only 06/24/14  Yes Marletta Lor, MD  gabapentin (NEURONTIN) 100 MG capsule TAKE 2 CAPSULES BY MOUTH EACH EVENING AND ONE CAPSULE AT BEDTIME Patient taking differently: TAKE 2 CAPSULES BY MOUTH DAILY IN THE MORNING AND ONE CAPSULE AT BEDTIME 06/17/15  Yes Marletta Lor, MD  Insulin Glargine (LANTUS SOLOSTAR) 100 UNIT/ML Solostar Pen Inject 54 Units into the skin daily at 10 pm. 05/20/15  Yes Marletta Lor, MD  levothyroxine (SYNTHROID, LEVOTHROID) 100 MCG tablet TAKE 1 TABLET BY MOUTH ONCE DAILY 05/27/15  Yes Marletta Lor, MD  lovastatin (MEVACOR) 20 MG tablet TAKE 1 TABLET BY MOUTH DAILY Patient taking differently: TAKE 1 TABLET BY MOUTH DAILY AT BEDTIME 04/24/15  Yes Marletta Lor, MD  metoprolol (TOPROL-XL) 100 MG 24 hr tablet 1 half tablet daily Patient taking differently: Take 50 mg by mouth every morning. 1 half tablet daily 06/24/14  Yes Marletta Lor, MD  Polyethyl Glycol-Propyl Glycol 0.4-0.3 % SOLN Apply 1 drop to eye daily as needed (dry eye).   Yes Historical Provider, MD  polyethylene glycol (MIRALAX) powder Take 17 g by mouth at bedtime. For regularity   Yes Historical Provider, MD  sertraline (ZOLOFT) 25 MG tablet Take 1 tablet (25 mg total) by mouth daily.  12/30/14  Yes Marletta Lor, MD  Tamsulosin HCl (FLOMAX) 0.4 MG CAPS Take 0.4 mg by mouth daily.    Yes Historical Provider, MD  traMADol (ULTRAM) 50 MG tablet Take 50 mg by mouth every 6 (six) hours as needed for moderate pain or severe pain.   Yes Historical Provider, MD  warfarin (COUMADIN) 5 MG tablet Take 2.5-5 mg by mouth daily at 6 PM. TAKE 2.41m on Sun/Tues/Fri's and Take 5 mg on all other days   Yes Historical Provider, MD    Social History:  reports that he quit smoking about 29 years ago. His smoking use included Cigarettes. He has a 35 pack-year  smoking history. He has never used smokeless tobacco. He reports that he does not drink alcohol or use illicit drugs.  Family History  Problem Relation Age of Onset  . Lung cancer Mother 40    nonsmoker  . Colon cancer Sister   . Prostate cancer Paternal Uncle   . Stroke Father   . Hip fracture Father   . Healthy Son   . Healthy Daughter     Review of Systems:  Constitutional: Denies fever, chills, diaphoresis. HEENT: Denies photophobia, eye pain, redness, hearing loss, ear pain, congestion, sore throat, rhinorrhea, sneezing, mouth sores, trouble swallowing, neck pain, neck stiffness and tinnitus.   Respiratory: Denies SOB, DOE, cough, chest tightness,  and wheezing.   Cardiovascular: Denies chest pain, palpitations and leg swelling.  Gastrointestinal: Denies nausea, vomiting, abdominal pain, diarrhea, constipation, blood in stool and abdominal distention.  Genitourinary: Denies dysuria, urgency, frequency, hematuria, flank pain and difficulty urinating.  Endocrine: Denies: hot or cold intolerance, sweats, changes in hair or nails, polyuria, polydipsia. Musculoskeletal: Denies myalgias, back pain, joint swelling, arthralgias and gait problem.  Skin: Denies pallor, rash and wound.  Neurological: Denies dizziness, seizures, syncope, weakness, light-headedness, numbness and headaches.  Hematological: Denies adenopathy. Easy  bruising, personal or family bleeding history  Psychiatric/Behavioral: Denies suicidal ideation, mood changes, confusion, nervousness, sleep disturbance and agitation   Physical Exam: Blood pressure 129/76, pulse 95, temperature 98 F (36.7 C), temperature source Rectal, resp. rate 19, SpO2 96 %. General: Elderly man in no distress, very pleasant and cooperative with exam, alert, awake, oriented 3. HEENT: Normocephalic, atraumatic, pupils equal and reactive to light, sharp limb movements intact, poor dentition Neck: Supple, no JVD, no lymphadenopathy, no bruits, goiter. Cardiovascular: Regular rate and rhythm, no murmurs, rubs or gallops. Lungs: Clear to auscultation bilaterally Abdomen: Soft, nontender, nondistended, positive bowel sounds. Extremities: Trace bilateral pitting edema, chronic venous insufficiency skin changes. Neurologic: Tremorous at rest, mental status intact, so strength diminished but equal over all extremities.  Labs on Admission:  Results for orders placed or performed during the hospital encounter of 06/23/15 (from the past 48 hour(s))  CBC with Differential/Platelet     Status: Abnormal   Collection Time: 06/23/15 12:47 PM  Result Value Ref Range   WBC 6.2 4.0 - 10.5 K/uL   RBC 3.57 (L) 4.22 - 5.81 MIL/uL   Hemoglobin 9.9 (L) 13.0 - 17.0 g/dL   HCT 31.9 (L) 39.0 - 52.0 %   MCV 89.4 78.0 - 100.0 fL   MCH 27.7 26.0 - 34.0 pg   MCHC 31.0 30.0 - 36.0 g/dL   RDW 16.7 (H) 11.5 - 15.5 %   Platelets 210 150 - 400 K/uL   Neutrophils Relative % 68 43 - 77 %   Neutro Abs 4.2 1.7 - 7.7 K/uL   Lymphocytes Relative 22 12 - 46 %   Lymphs Abs 1.3 0.7 - 4.0 K/uL   Monocytes Relative 7 3 - 12 %   Monocytes Absolute 0.4 0.1 - 1.0 K/uL   Eosinophils Relative 3 0 - 5 %   Eosinophils Absolute 0.2 0.0 - 0.7 K/uL   Basophils Relative 0 0 - 1 %   Basophils Absolute 0.0 0.0 - 0.1 K/uL  Comprehensive metabolic panel     Status: Abnormal   Collection Time: 06/23/15 12:47 PM    Result Value Ref Range   Sodium 141 135 - 145 mmol/L   Potassium 4.4 3.5 - 5.1 mmol/L   Chloride 105 101 - 111 mmol/L   CO2  29 22 - 32 mmol/L   Glucose, Bld 163 (H) 65 - 99 mg/dL   BUN 69 (H) 6 - 20 mg/dL   Creatinine, Ser 2.65 (H) 0.61 - 1.24 mg/dL   Calcium 9.9 8.9 - 10.3 mg/dL   Total Protein 7.2 6.5 - 8.1 g/dL   Albumin 3.0 (L) 3.5 - 5.0 g/dL   AST 25 15 - 41 U/L   ALT 7 (L) 17 - 63 U/L   Alkaline Phosphatase 238 (H) 38 - 126 U/L   Total Bilirubin 0.8 0.3 - 1.2 mg/dL   GFR calc non Af Amer 21 (L) >60 mL/min   GFR calc Af Amer 24 (L) >60 mL/min    Comment: (NOTE) The eGFR has been calculated using the CKD EPI equation. This calculation has not been validated in all clinical situations. eGFR's persistently <60 mL/min signify possible Chronic Kidney Disease.    Anion gap 7 5 - 15  Protime-INR     Status: Abnormal   Collection Time: 06/23/15 12:47 PM  Result Value Ref Range   Prothrombin Time 27.0 (H) 11.6 - 15.2 seconds   INR 2.54 (H) 0.00 - 1.49  I-stat troponin, ED     Status: None   Collection Time: 06/23/15 12:54 PM  Result Value Ref Range   Troponin i, poc 0.01 0.00 - 0.08 ng/mL   Comment 3            Comment: Due to the release kinetics of cTnI, a negative result within the first hours of the onset of symptoms does not rule out myocardial infarction with certainty. If myocardial infarction is still suspected, repeat the test at appropriate intervals.   Urinalysis, Routine w reflex microscopic (not at [facility])     Status: Abnormal   Collection Time: 06/23/15  2:44 PM  Result Value Ref Range   Color, Urine YELLOW YELLOW   APPearance CLOUDY (A) CLEAR   Specific Gravity, Urine 1.017 1.005 - 1.030   pH 7.0 5.0 - 8.0   Glucose, UA NEGATIVE NEGATIVE mg/dL   Hgb urine dipstick LARGE (A) NEGATIVE   Bilirubin Urine NEGATIVE NEGATIVE   Ketones, ur NEGATIVE NEGATIVE mg/dL   Protein, ur 30 (A) NEGATIVE mg/dL   Urobilinogen, UA 0.2 0.0 - 1.0 mg/dL   Nitrite POSITIVE (A)  NEGATIVE   Leukocytes, UA LARGE (A) NEGATIVE  Urine microscopic-add on     Status: None   Collection Time: 06/23/15  2:44 PM  Result Value Ref Range   Squamous Epithelial / LPF RARE RARE   WBC, UA 21-50 <3 WBC/hpf   RBC / HPF 21-50 <3 RBC/hpf   Bacteria, UA RARE RARE    Radiological Exams on Admission: Dg Chest 2 View  06/23/2015   CLINICAL DATA:  Cough, generalized weakness.  EXAM: CHEST  2 VIEW  COMPARISON:  03/11/2015  FINDINGS: Cardiomegaly with vascular congestion and diffuse interstitial prominence throughout the lungs which could reflect interstitial edema. There are low lung volumes with bibasilar opacities, left greater than right. Suspect small left effusion. No acute bony abnormality.  IMPRESSION: Cardiomegaly, vascular congestion and suspected mild interstitial edema.  Bibasilar opacities, left greater than right. This likely reflects atelectasis of the right base. Cannot exclude pneumonia in the left base.  Small effusion.   Electronically Signed   By: Rolm Baptise M.D.   On: 06/23/2015 13:09   Ct Head Wo Contrast  06/23/2015   CLINICAL DATA:  Cough, swallowing and speech difficulty, weakness  EXAM: CT HEAD WITHOUT CONTRAST  TECHNIQUE:  Contiguous axial images were obtained from the base of the skull through the vertex without intravenous contrast.  COMPARISON:  12/16/2003  FINDINGS: No skull fracture is noted. Paranasal sinuses and mastoid air cells are unremarkable.  No intracranial hemorrhage, mass effect or midline shift. Stable atrophy. Stable periventricular chronic white matter disease. No definite acute cortical infarction. No mass lesion is noted on this unenhanced scan. The gray and white-matter differentiation is preserved.  IMPRESSION: No acute intracranial abnormality. Stable atrophy and chronic white matter disease.   Electronically Signed   By: Lahoma Crocker M.D.   On: 06/23/2015 13:28    Assessment/Plan Principal Problem:   UTI (lower urinary tract infection) Active  Problems:   Generalized weakness   Parkinson's disease   CKD (chronic kidney disease) stage 4, GFR 15-29 ml/min   Anemia, chronic disease   Generalized weakness -Suspect related to urinary tract infection in this elderly gentleman. -Check TSH/B-12. -Obtain PT/OT evaluations.  Urinary tract infection -Continue Rocephin pending culture data.  Infiltrate on chest x-ray -Does not seem like definite pneumonia on imaging, he has no symptoms of pneumonia, as such will not treat at this time.  Chronic kidney disease stage IV  -creatinine seems to be at baseline or little improved.  Parkinson's disease -Continue Sinemet. -Appreciate neurology recommendations.  Anemia of chronic disease -Hemoglobin is at his baseline of around 9-1/2-10. -Check anemia panel.  Atrial fibrillation  -rate controlled, anticoagulated on Coumadin, will ask pharmacy to dose.  DVT prophylaxis -Anticoagulated on Coumadin.  CODE STATUS -Full code. Discussed with daughter at bedside.   Time Spent on Admission: 85 minutes  [facility] Pager: [phone] 06/23/2015, 4:55 PM

## 2015-06-23 NOTE — Telephone Encounter (Signed)
I called Mrs. Doering back and she said that patient is having worse tremors inside and out, he can't stand, he is having trouble swallowing and she is having trouble understanding his speech.  Please advise.

## 2015-06-23 NOTE — Progress Notes (Signed)
ANTICOAGULATION CONSULT NOTE - Initial Consult  Pharmacy Consult for Warfarin Indication: atrial fibrillation  Allergies  Allergen Reactions  . Penicillins Rash    Happened 60 years ago    Patient Measurements:   Heparin Dosing Weight:   Vital Signs: Temp: 98 F (36.7 C) (07/19 1639) Temp Source: Rectal (07/19 1507) BP: 129/76 mmHg (07/19 1630) Pulse Rate: 95 (07/19 1630)  Labs:  Recent Labs  06/23/15 1247  HGB 9.9*  HCT 31.9*  PLT 210  LABPROT 27.0*  INR 2.54*  CREATININE 2.65*    CrCl cannot be calculated (Unknown ideal weight.).   Medical History: Past Medical History  Diagnosis Date  . Family history of malignant neoplasm of gastrointestinal tract   . Unspecified constipation   . Abnormal involuntary movements(781.0)   . Vitamin B12 deficiency   . Claudication   . Hypertrophy of prostate with urinary obstruction and other lower urinary tract symptoms (LUTS)   . Obesity   . Mixed hyperlipidemia   . Atrial fibrillation     --myoview 07/2006: not gated. No ischemia or infarct  Diastolic HF-- Echo 07/2504 ER60% mild AI/MR. RV mild to moderately dilated/ dysfunction. ? restrictive CM . RVSP 36  . Bladder polyps   . Kidney stones   . Unspecified sleep apnea     NPSG 05/14/2007- AHI 80.7/ hr  . OSA (obstructive sleep apnea)     "tried mask; couldn't use it; so I quit" (11/12/2014)  . Hypothyroidism   . History of hiatal hernia   . Daily headache     "I've had headaches all my life; get them almost daily" (11/12/2014)  . Stroke ~ 1985    "simple stroke" denies residual on 11/12/2014  . Arthritis     "just about qwhere"   . Pleural effusion 11/12/2014 admission  . Dysrhythmia     Afib  . Type II or unspecified type diabetes mellitus with neurological manifestations, not stated as uncontrolled   . Shortness of breath dyspnea     walking distance or climbing stairs  . Neuromuscular disorder     Parkinson's / age 21 had growth per left shoulder/neck area  that stopped blood flow to left arm was removed and has not no further problems.    Medications:   (Not in a hospital admission) Scheduled:   Infusions:  . cefTRIAXone (ROCEPHIN)  IV 1 g (06/23/15 1632)    Assessment: 81yo male presents with increasing tremor and difficulty speaking. Pharmacy is consulted to dose warfarin for atrial fibrillation. INR 2.54, Hgb 9.9, Plt 210, sCr 2.65.  PTA Warfarin Dose: 2.5mg  Sun/Tues/Fri and 5mg  AODs with last dose 7/18  Goal of Therapy:  INR 2-3 Monitor platelets by anticoagulation protocol: Yes   Plan:  Warfarin 2.5mg  tonight x1 Daily INR/CBC Monitor s/sx of bleeding  Andrey Cota. Diona Foley, PharmD Clinical Pharmacist Pager [phone] 06/23/2015,4:57 PM

## 2015-06-23 NOTE — Progress Notes (Signed)
Called report -ED-Melissa.

## 2015-06-23 NOTE — ED Provider Notes (Signed)
CSN: [id number]     Arrival date & time 06/23/15  1202 History   First MD Initiated Contact with Patient 06/23/15 1204     Chief Complaint  Patient presents with  . Weakness     (Consider location/radiation/quality/duration/timing/severity/associated sxs/prior Treatment) Patient is a 81 y.o. male presenting with weakness.  Weakness This is a new problem. The current episode started 12 to 24 hours ago. The problem occurs constantly. The problem has been gradually worsening. Pertinent negatives include no chest pain, no abdominal pain, no headaches and no shortness of breath. Nothing aggravates the symptoms. Nothing relieves the symptoms. Treatments tried: sinemet. The treatment provided no relief.    Past Medical History  Diagnosis Date  . Family history of malignant neoplasm of gastrointestinal tract   . Unspecified constipation   . Abnormal involuntary movements(781.0)   . Vitamin B12 deficiency   . Claudication   . Hypertrophy of prostate with urinary obstruction and other lower urinary tract symptoms (LUTS)   . Obesity   . Mixed hyperlipidemia   . Atrial fibrillation     --myoview 07/2006: not gated. No ischemia or infarct  Diastolic HF-- Echo 0/9983 ER60% mild AI/MR. RV mild to moderately dilated/ dysfunction. ? restrictive CM . RVSP 36  . Bladder polyps   . Kidney stones   . Unspecified sleep apnea     NPSG 05/14/2007- AHI 80.7/ hr  . OSA (obstructive sleep apnea)     "tried mask; couldn't use it; so I quit" (11/12/2014)  . Hypothyroidism   . History of hiatal hernia   . Daily headache     "I've had headaches all my life; get them almost daily" (11/12/2014)  . Stroke ~ 1985    "simple stroke" denies residual on 11/12/2014  . Arthritis     "just about qwhere"   . Pleural effusion 11/12/2014 admission  . Dysrhythmia     Afib  . Type II or unspecified type diabetes mellitus with neurological manifestations, not stated as uncontrolled   . Shortness of breath dyspnea      walking distance or climbing stairs  . Neuromuscular disorder     Parkinson's / age 46 had growth per left shoulder/neck area that stopped blood flow to left arm was removed and has not no further problems.   Past Surgical History  Procedure Laterality Date  . Appendectomy    . Kidney stone surgery      Kidney stone retrieval with uretal stent x 2   . Bladder polyps      many times  last time 12/15  . Transurethral resection of bladder    . Orif elbow fracture Left 1976  . Fracture surgery    . Excisional hemorrhoidectomy  1950's  . Ureteral stent placement  X 1  . Cystoscopy w/ stone manipulation  X 2  . Cataract extraction w/ intraocular lens  implant, bilateral Bilateral   . Eye surgery Bilateral     catarct  . Chest tube insertion Right 12/12/2014    Procedure: INSERTION PLEURAL DRAINAGE CATHETER;  Surgeon: Ivin Poot, MD;  Location: [facility];  Service: Thoracic;  Laterality: Right;  . Colonscopy       removal of polyps  . Cystoscopy N/A 01/15/2015    Procedure: CYSTOSCOPY;  Surgeon: Bernestine Amass, MD;  Location: [facility];  Service: Urology;  Laterality: N/A;  . Transurethral resection of bladder tumor N/A 01/15/2015    Procedure: TRANSURETHRAL RESECTION OF BLADDER TUMOR (TURBT);  Surgeon: Bernestine Amass, MD;  Location: [facility];  Service: Urology;  Laterality: N/A;  . Removal of pleural drainage catheter Right 02/23/2015    Procedure: REMOVAL OF PLEURAL DRAINAGE CATHETER;  Surgeon: Ivin Poot, MD;  Location: [facility];  Service: Thoracic;  Laterality: Right;   Family History  Problem Relation Age of Onset  . Lung cancer Mother 49    nonsmoker  . Colon cancer Sister   . Prostate cancer Paternal Uncle   . Stroke Father   . Hip fracture Father   . Healthy Son   . Healthy Daughter    History  Substance Use Topics  . Smoking status: Former Smoker -- 1.00 packs/day for 35 years    Types: Cigarettes    Quit date: 12/05/1985  . Smokeless tobacco: Never Used  . Alcohol Use: No     Review of Systems  Constitutional: Negative for fever.  HENT: Negative for drooling and rhinorrhea.   Eyes: Negative for pain.  Respiratory: Positive for cough. Negative for shortness of breath.   Cardiovascular: Negative for chest pain and leg swelling.  Gastrointestinal: Positive for diarrhea. Negative for nausea, vomiting and abdominal pain.  Genitourinary: Negative for dysuria and hematuria.  Musculoskeletal: Negative for gait problem and neck pain.  Skin: Negative for color change.  Neurological: Positive for weakness. Negative for numbness and headaches.  Hematological: Negative for adenopathy.  Psychiatric/Behavioral: Negative for behavioral problems.  All other systems reviewed and are negative.     Allergies  Penicillins  Home Medications   Prior to Admission medications   Medication Sig Start Date End Date Taking? Authorizing Provider  acetaminophen (TYLENOL) 500 MG tablet Take 500 mg by mouth every 6 (six) hours as needed for mild pain.    Historical Provider, MD  carbidopa-levodopa (SINEMET IR) 25-100 MG per tablet Take 1 tablet by mouth 3 (three) times daily. 01/07/15   Donika Keith Rake, DO  Cyanocobalamin (VITAMIN B 12 PO) Take 1 tablet by mouth daily.    Historical Provider, MD  furosemide (LASIX) 20 MG tablet 1 tablet Monday, Wednesday, Friday only Patient taking differently: Take 20 mg by mouth every Monday, Wednesday, and Friday. 1 tablet Monday, Wednesday, Friday only 06/24/14   Marletta Lor, MD  gabapentin (NEURONTIN) 100 MG capsule TAKE 2 CAPSULES BY MOUTH EACH EVENING AND ONE CAPSULE AT BEDTIME 06/17/15   Marletta Lor, MD  glucose blood test strip 1 each by Other route daily as needed for other. 07/10/14   Marletta Lor, MD  Insulin Glargine (LANTUS SOLOSTAR) 100 UNIT/ML Solostar Pen Inject 54 Units into the skin daily at 10 pm. 05/20/15   Marletta Lor, MD  Insulin Pen Needle (PEN NEEDLES 31GX5/16") 31G X 8 MM MISC 1 each by Does not  apply route as directed. 07/09/12   Neena Rhymes, MD  Lancets [facility] ULTRASOFT) lancets 1 each by Other route daily as needed for other. 07/10/14   Marletta Lor, MD  levothyroxine (SYNTHROID, LEVOTHROID) 100 MCG tablet TAKE 1 TABLET BY MOUTH ONCE DAILY 05/27/15   Marletta Lor, MD  lovastatin (MEVACOR) 20 MG tablet TAKE 1 TABLET BY MOUTH DAILY 04/24/15   Marletta Lor, MD  metoprolol (TOPROL-XL) 100 MG 24 hr tablet 1 half tablet daily Patient taking differently: Take 50 mg by mouth every morning. 1 half tablet daily 06/24/14   Marletta Lor, MD  Polyethyl Glycol-Propyl Glycol 0.4-0.3 % SOLN Apply 1 drop to eye daily as needed (dry eye).    Historical Provider, MD  polyethylene glycol (MIRALAX) powder Take 17 g by mouth at bedtime. For regularity    Historical Provider, MD  sertraline (ZOLOFT) 25 MG tablet Take 1 tablet (25 mg total) by mouth daily. 12/30/14   Marletta Lor, MD  Tamsulosin HCl (FLOMAX) 0.4 MG CAPS Take 0.4 mg by mouth daily.     Historical Provider, MD  warfarin (COUMADIN) 5 MG tablet Take 5 mg by mouth daily. 2.5mg  on Sun/Tues./Fri's    Historical Provider, MD   BP 104/55 mmHg  Pulse 98  Temp(Src) 97.9 F (36.6 C) (Oral)  Resp 21  SpO2 94% Physical Exam  Constitutional: He is oriented to person, place, and time. He appears well-developed and well-nourished.  HENT:  Head: Normocephalic and atraumatic.  Right Ear: External ear normal.  Left Ear: External ear normal.  Nose: Nose normal.  Mouth/Throat: Oropharynx is clear and moist. No oropharyngeal exudate.  Eyes: Conjunctivae and EOM are normal. Pupils are equal, round, and reactive to light.  Neck: Normal range of motion. Neck supple.  Cardiovascular: Normal rate, regular rhythm, normal heart sounds and intact distal pulses.  Exam reveals no gallop and no friction rub.   No murmur heard. Pulmonary/Chest: Effort normal and breath sounds normal. No respiratory distress. He has no wheezes.   Abdominal: Soft. Bowel sounds are normal. He exhibits no distension. There is no tenderness. There is no rebound and no guarding.  Musculoskeletal: Normal range of motion. He exhibits no edema or tenderness.  Neurological: He is alert and oriented to person, place, and time.  alert, oriented x3 speech: some stuttering memory: intact grossly cranial nerves II-XII: intact motor strength: full proximally and distally  sensation: intact to light touch diffusely  cerebellar: finger-to-nose intact gait: deferred  Skin: Skin is warm and dry.  Psychiatric: He has a normal mood and affect. His behavior is normal.  Nursing note and vitals reviewed.   ED Course  Procedures (including critical care time) Labs Review Labs Reviewed  CBC WITH DIFFERENTIAL/PLATELET - Abnormal; Notable for the following:    RBC 3.57 (*)    Hemoglobin 9.9 (*)    HCT 31.9 (*)    RDW 16.7 (*)    All other components within normal limits  COMPREHENSIVE METABOLIC PANEL - Abnormal; Notable for the following:    Glucose, Bld 163 (*)    BUN 69 (*)    Creatinine, Ser 2.65 (*)    Albumin 3.0 (*)    ALT 7 (*)    Alkaline Phosphatase 238 (*)    GFR calc non Af Amer 21 (*)    GFR calc Af Amer 24 (*)    All other components within normal limits  PROTIME-INR - Abnormal; Notable for the following:    Prothrombin Time 27.0 (*)    INR 2.54 (*)    All other components within normal limits  URINALYSIS, ROUTINE W REFLEX MICROSCOPIC (NOT AT [facility])  Randolm Idol, ED    Imaging Review Dg Chest 2 View  06/23/2015   CLINICAL DATA:  Cough, generalized weakness.  EXAM: CHEST  2 VIEW  COMPARISON:  03/11/2015  FINDINGS: Cardiomegaly with vascular congestion and diffuse interstitial prominence throughout the lungs which could reflect interstitial edema. There are low lung volumes with bibasilar opacities, left greater than right. Suspect small left effusion. No acute bony abnormality.  IMPRESSION: Cardiomegaly, vascular  congestion and suspected mild interstitial edema.  Bibasilar opacities, left greater than right. This likely reflects atelectasis of the right base. Cannot exclude pneumonia in the left base.  Small effusion.   Electronically Signed   By: Rolm Baptise M.D.   On: 06/23/2015 13:09   Ct Head Wo Contrast  06/23/2015   CLINICAL DATA:  Cough, swallowing and speech difficulty, weakness  EXAM: CT HEAD WITHOUT CONTRAST  TECHNIQUE: Contiguous axial images were obtained from the base of the skull through the vertex without intravenous contrast.  COMPARISON:  12/16/2003  FINDINGS: No skull fracture is noted. Paranasal sinuses and mastoid air cells are unremarkable.  No intracranial hemorrhage, mass effect or midline shift. Stable atrophy. Stable periventricular chronic white matter disease. No definite acute cortical infarction. No mass lesion is noted on this unenhanced scan. The gray and white-matter differentiation is preserved.  IMPRESSION: No acute intracranial abnormality. Stable atrophy and chronic white matter disease.   Electronically Signed   By: Lahoma Crocker M.D.   On: 06/23/2015 13:28     EKG Interpretation   Date/Time:  Tuesday June 23 2015 12:04:50 EDT Ventricular Rate:  99 PR Interval:    QRS Duration: 168 QT Interval:  401 QTC Calculation: 515 R Axis:   76 Text Interpretation:  Atrial fibrillation Right bundle branch block No  significant change since last tracing Confirmed by Dave Mannes  MD, Brittyn Salaz  ([id number]) on 06/23/2015 12:08:08 PM      MDM   Final diagnoses:  Cough  Generalized weakness  Speech complaints  Dysphagia  UTI (lower urinary tract infection)    12:23 PM 81 y.o. male w hx of afib on coumadin, parkinsons, CVA, bladder ca, pleural effusion who pw progressive functional decline which has worsened over the last 24 hours. The patient notes he's had a persistent nonproductive cough and over the last 24 hours has developed difficulty swallowing and speech difficulties. He is also  had some worsening confusion. This is per the family and also per their discussion with their neurologist whose phone note is below. The patient is afebrile and vital signs are unremarkable here. He denies any shortness of breath. He notes some chronic right-sided chest pain related to a fall last September. His neurologist is recommending rule out stroke given new dysphasia, dysarthria, and generalized weakness and ambulatory difficulties.  Dr. Serita Grit phone note: "For the past several weeks patient has been having persistent cough and over the past 24 hours, wife says that he has been having progressive functional decline. He is having worsening shakes his body and developed new swallowing and speech difficulties. He has also had worsening confusion and difficulty walking. He is able to stand with a walker, but fears walking because of weakness. Symptoms have started gradually and not improved. Patient self titrated his Sinemet to one tablet 5 times daily yesterday and did not notice any improvement.  3:34 PM Discussed case with Dr. Leonel Ramsay from neurology who will see the patient. Will plan on admission to the hospitalist service. Pt found to have UTI, will tx w/ rocephin and send urine culture.   Pamella Pert, MD 06/23/15 1556

## 2015-06-23 NOTE — ED Notes (Signed)
Pt here via EMS for eval of generalized weakness x 1 week. EMS noted fever of 100.2 temporal however pt fmaily had heating blanket on pt. Pt has indwelling catheter from bladder cancer, last changed 3 weeks ago. Pt reports dark urine. Pt faily reports "random episodes of saying things that don't make sense" Pt has hx parkinsons. No pain reported.

## 2015-06-23 NOTE — Progress Notes (Addendum)
Pt arrived on unit from ED. Stage II found-R buttock. Foam bandage applied. Discolored L/R buttocks. O2 sat 92%. Oriented to unit, bed alarm in place, hospital brochure given.

## 2015-06-23 NOTE — Consult Note (Addendum)
NEURO HOSPITALIST CONSULT NOTE   Referring physician: Jerilee Hoh   Reason for Consult: increasing tremor and difficulty with speech over last 6 weeks and new difficulty swallowing  HPI:                                                                                                                                          [name] [name] is an 81 y.o. male who is followed by Dr Posey Pronto for PD as out patient.  He currently is on Sinemet 1 tab TID but states he takes his medications as follows : one tab in AM/ one tab at lunch/ one tab HS. He states over the last 6 weeks his tremor/ memory/ gait has worsened and speech has also become more stuttering.  This AM he was eating his breakfast and noted difficulty swallowing his food.  The food seemed to get caught in his throat.  Patient had tried 5 Tabs throughout the day yesterday but saw no improvement. Patient called Dr. Posey Pronto who "recommended taking 2 tabs of his Sinemet TID.  He has only taken on tab today. Due to concerns of his dysphagia he was recommended to come to ED.   current labs of interest: Cr 2.65, AP 238, glucose 163, WBC 6.2, UA--cloudy, Leukocytes and Nitrites   CXR-- Interstitial edema  Past Medical History  Diagnosis Date  . Family history of malignant neoplasm of gastrointestinal tract   . Unspecified constipation   . Abnormal involuntary movements(781.0)   . Vitamin B12 deficiency   . Claudication   . Hypertrophy of prostate with urinary obstruction and other lower urinary tract symptoms (LUTS)   . Obesity   . Mixed hyperlipidemia   . Atrial fibrillation     --myoview 07/2006: not gated. No ischemia or infarct  Diastolic HF-- Echo 04/8849 ER60% mild AI/MR. RV mild to moderately dilated/ dysfunction. ? restrictive CM . RVSP 36  . Bladder polyps   . Kidney stones   . Unspecified sleep apnea     NPSG 05/14/2007- AHI 80.7/ hr  . OSA (obstructive sleep apnea)     "tried mask; couldn't use it; so I quit"  (11/12/2014)  . Hypothyroidism   . History of hiatal hernia   . Daily headache     "I've had headaches all my life; get them almost daily" (11/12/2014)  . Stroke ~ 1985    "simple stroke" denies residual on 11/12/2014  . Arthritis     "just about qwhere"   . Pleural effusion 11/12/2014 admission  . Dysrhythmia     Afib  . Type II or unspecified type diabetes mellitus with neurological manifestations, not stated as uncontrolled   . Shortness of breath dyspnea     walking distance or climbing stairs  . Neuromuscular disorder  Parkinson's / age 12 had growth per left shoulder/neck area that stopped blood flow to left arm was removed and has not no further problems.    Past Surgical History  Procedure Laterality Date  . Appendectomy    . Kidney stone surgery      Kidney stone retrieval with uretal stent x 2   . Bladder polyps      many times  last time 12/15  . Transurethral resection of bladder    . Orif elbow fracture Left 1976  . Fracture surgery    . Excisional hemorrhoidectomy  1950's  . Ureteral stent placement  X 1  . Cystoscopy w/ stone manipulation  X 2  . Cataract extraction w/ intraocular lens  implant, bilateral Bilateral   . Eye surgery Bilateral     catarct  . Chest tube insertion Right 12/12/2014    Procedure: INSERTION PLEURAL DRAINAGE CATHETER;  Surgeon: Ivin Poot, MD;  Location: [facility];  Service: Thoracic;  Laterality: Right;  . Colonscopy       removal of polyps  . Cystoscopy N/A 01/15/2015    Procedure: CYSTOSCOPY;  Surgeon: Bernestine Amass, MD;  Location: [facility];  Service: Urology;  Laterality: N/A;  . Transurethral resection of bladder tumor N/A 01/15/2015    Procedure: TRANSURETHRAL RESECTION OF BLADDER TUMOR (TURBT);  Surgeon: Bernestine Amass, MD;  Location: [facility];  Service: Urology;  Laterality: N/A;  . Removal of pleural drainage catheter Right 02/23/2015    Procedure: REMOVAL OF PLEURAL DRAINAGE CATHETER;  Surgeon: Ivin Poot, MD;  [facility];  Service: Thoracic;  Laterality: Right;    Family History  Problem Relation Age of Onset  . Lung cancer Mother 35    nonsmoker  . Colon cancer Sister   . Prostate cancer Paternal Uncle   . Stroke Father   . Hip fracture Father   . Healthy Son   . Healthy Daughter     Social History:  reports that he quit smoking about 29 years ago. His smoking use included Cigarettes. He has a 35 pack-year smoking history. He has never used smokeless tobacco. He reports that he does not drink alcohol or use illicit drugs.  Allergies  Allergen Reactions  . Penicillins Rash    Happened 60 years ago    MEDICATIONS:                                                                                                                     Current Facility-Administered Medications  Medication Dose Route Frequency Provider Last Rate Last Dose  . cefTRIAXone (ROCEPHIN) 1 g in dextrose 5 % 50 mL IVPB  1 g Intravenous Once Pamella Pert, MD       Current Outpatient Prescriptions  Medication Sig Dispense Refill  . acetaminophen (TYLENOL) 500 MG tablet Take 500 mg by mouth every 6 (six) hours as needed for mild pain.    . carbidopa-levodopa (SINEMET IR) 25-100 MG per tablet Take 1 tablet by mouth  3 (three) times daily. 270 tablet 3  . Cyanocobalamin (VITAMIN B 12 PO) Take 1 tablet by mouth daily.    . furosemide (LASIX) 20 MG tablet 1 tablet Monday, Wednesday, Friday only (Patient taking differently: Take 20 mg by mouth every Monday, Wednesday, and Friday. 1 tablet Monday, Wednesday, Friday only) 90 tablet 3  . gabapentin (NEURONTIN) 100 MG capsule TAKE 2 CAPSULES BY MOUTH EACH EVENING AND ONE CAPSULE AT BEDTIME 60 capsule 5  . Insulin Glargine (LANTUS SOLOSTAR) 100 UNIT/ML Solostar Pen Inject 54 Units into the skin daily at 10 pm. 15 mL 1  . levothyroxine (SYNTHROID, LEVOTHROID) 100 MCG tablet TAKE 1 TABLET BY MOUTH ONCE DAILY 90 tablet 1  . lovastatin (MEVACOR) 20 MG tablet TAKE 1 TABLET BY MOUTH DAILY 30  tablet 5  . metoprolol (TOPROL-XL) 100 MG 24 hr tablet 1 half tablet daily (Patient taking differently: Take 50 mg by mouth every morning. 1 half tablet daily) 90 tablet 3  . Polyethyl Glycol-Propyl Glycol 0.4-0.3 % SOLN Apply 1 drop to eye daily as needed (dry eye).    . polyethylene glycol (MIRALAX) powder Take 17 g by mouth at bedtime. For regularity    . sertraline (ZOLOFT) 25 MG tablet Take 1 tablet (25 mg total) by mouth daily. 30 tablet 11  . Tamsulosin HCl (FLOMAX) 0.4 MG CAPS Take 0.4 mg by mouth daily.     [name] warfarin (COUMADIN) 5 MG tablet Take 5 mg by mouth daily. 2.5mg  on Sun/Tues./Fri's       ROS:                                                                                                                                       History obtained from the patient  General ROS: negative for - chills, fatigue, fever, night sweats, weight gain or weight loss Psychological ROS: negative for - behavioral disorder, hallucinations, memory difficulties, mood swings or suicidal ideation Ophthalmic ROS: negative for - blurry vision, double vision, eye pain or loss of vision ENT ROS: negative for - epistaxis, nasal discharge, oral lesions, sore throat, tinnitus or vertigo Allergy and Immunology ROS: negative for - hives or itchy/watery eyes Hematological and Lymphatic ROS: negative for - bleeding problems, bruising or swollen lymph nodes Endocrine ROS: negative for - galactorrhea, hair pattern changes, polydipsia/polyuria or temperature intolerance Respiratory ROS: negative for - cough, hemoptysis, shortness of breath or wheezing Cardiovascular ROS: negative for - chest pain, dyspnea on exertion, edema or irregular heartbeat Gastrointestinal ROS: negative for - abdominal pain, diarrhea, hematemesis, nausea/vomiting or stool incontinence Genito-Urinary ROS: negative for - dysuria, hematuria, incontinence or urinary frequency/urgency Musculoskeletal ROS: negative for - joint swelling or  muscular weakness Neurological ROS: as noted in HPI Dermatological ROS: negative for rash and skin lesion changes   Blood pressure 127/62, pulse 94, temperature 97.9 F (36.6 C), temperature source Rectal, resp. rate 15, SpO2 92 %.  Neurologic Examination:                                                                                                      HEENT-  Normocephalic, no lesions, without obvious abnormality.  Normal external eye and conjunctiva.  Normal TM's bilaterally.  Normal auditory canals and external ears. Normal external nose, mucus membranes and septum.  Normal pharynx. Cardiovascular- irregularly irregular rhythm, pulses palpable throughout   Lungs- chest clear, no wheezing, rales, normal symmetric air entry Abdomen- normal findings: bowel sounds normal Extremities- no joint deformities, effusion, or inflammation Lymph-no adenopathy palpable Musculoskeletal-no joint tenderness, deformity or swelling Skin-bilateral ankle hyperpigmentation  Neurological Examination Mental Status: Alert, oriented, thought content appropriate.  Speech fluent--at times broken due to difficulty with articulation from tremor, without evidence of aphasia.  Able to follow 3 step commands without difficulty. Cranial Nerves: II: Discs flat bilaterally; Visual fields grossly normal, pupils equal, round, reactive to light and accommodation III,IV, VI: ptosis not present, extra-ocular motions intact bilaterally V,VII: smile symmetric, facial light touch sensation normal bilaterally VIII: hearing normal bilaterally IX,X: uvula rises symmetrically XI: bilateral shoulder shrug XII: midline tongue extension Motor: 4+ throughout, no significant rigidity, cog wheeling.  At rest he showed minimal tremor.  When arms held stretched out he had significant tremor. Sensory: Pinprick and light touch intact throughout, bilaterally Deep Tendon Reflexes: 1+ and symmetric throughout UE, no KJH or  AJ Plantars: Right: downgoing   Left: downgoing Cerebellar: normal finger-to-nose and normal heel-to-shin test with some difficulty due to asterixis.  Gait: not tested for patient safety.       Lab Results: Basic Metabolic Panel:  Recent Labs Lab 06/23/15 1247  NA 141  K 4.4  CL 105  CO2 29  GLUCOSE 163*  BUN 69*  CREATININE 2.65*  CALCIUM 9.9    Liver Function Tests:  Recent Labs Lab 06/23/15 1247  AST 25  ALT 7*  ALKPHOS 238*  BILITOT 0.8  PROT 7.2  ALBUMIN 3.0*   No results for input(s): LIPASE, AMYLASE in the last 168 hours. No results for input(s): AMMONIA in the last 168 hours.  CBC:  Recent Labs Lab 06/23/15 1247  WBC 6.2  NEUTROABS 4.2  HGB 9.9*  HCT 31.9*  MCV 89.4  PLT 210    Cardiac Enzymes: No results for input(s): CKTOTAL, CKMB, CKMBINDEX, TROPONINI in the last 168 hours.  Lipid Panel: No results for input(s): CHOL, TRIG, HDL, CHOLHDL, VLDL, LDLCALC in the last 168 hours.  CBG: No results for input(s): GLUCAP in the last 168 hours.  Microbiology: Results for orders placed or performed during the hospital encounter of 01/10/15  Urine culture     Status: None   Collection Time: 01/10/15 10:27 PM  Result Value Ref Range Status   Specimen Description URINE, CLEAN CATCH  Final   Special Requests Normal  Final   Colony Count   Final    >=100,000 COLONIES/ML Performed at [place] Performed at [facility]    Report  Status 01/13/2015 FINAL  Final   Organism ID, Bacteria PROTEUS MIRABILIS  Final      Susceptibility   Proteus mirabilis - MIC*    AMPICILLIN <=2 SENSITIVE Sensitive     CEFAZOLIN <=4 SENSITIVE Sensitive     CEFTRIAXONE <=1 SENSITIVE Sensitive     CIPROFLOXACIN 0.5 SENSITIVE Sensitive     GENTAMICIN <=1 SENSITIVE Sensitive     LEVOFLOXACIN 1 SENSITIVE Sensitive     NITROFURANTOIN 256 RESISTANT Resistant     TOBRAMYCIN <=1 SENSITIVE Sensitive      TRIMETH/SULFA <=20 SENSITIVE Sensitive     PIP/TAZO <=4 SENSITIVE Sensitive     * PROTEUS MIRABILIS    Coagulation Studies:  Recent Labs  06/23/15 1247  LABPROT 27.0*  INR 2.54*    Imaging: Dg Chest 2 View  06/23/2015   CLINICAL DATA:  Cough, generalized weakness.  EXAM: CHEST  2 VIEW  COMPARISON:  03/11/2015  FINDINGS: Cardiomegaly with vascular congestion and diffuse interstitial prominence throughout the lungs which could reflect interstitial edema. There are low lung volumes with bibasilar opacities, left greater than right. Suspect small left effusion. No acute bony abnormality.  IMPRESSION: Cardiomegaly, vascular congestion and suspected mild interstitial edema.  Bibasilar opacities, left greater than right. This likely reflects atelectasis of the right base. Cannot exclude pneumonia in the left base.  Small effusion.   Electronically Signed   By: Rolm Baptise M.D.   On: 06/23/2015 13:09   Ct Head Wo Contrast  06/23/2015   CLINICAL DATA:  Cough, swallowing and speech difficulty, weakness  EXAM: CT HEAD WITHOUT CONTRAST  TECHNIQUE: Contiguous axial images were obtained from the base of the skull through the vertex without intravenous contrast.  COMPARISON:  12/16/2003  FINDINGS: No skull fracture is noted. Paranasal sinuses and mastoid air cells are unremarkable.  No intracranial hemorrhage, mass effect or midline shift. Stable atrophy. Stable periventricular chronic white matter disease. No definite acute cortical infarction. No mass lesion is noted on this unenhanced scan. The gray and white-matter differentiation is preserved.  IMPRESSION: No acute intracranial abnormality. Stable atrophy and chronic white matter disease.   Electronically Signed   By: Lahoma Crocker M.D.   On: 06/23/2015 13:28       Assessment and plan per attending neurologist  Etta Quill PA-C [facility] [phone]  06/23/2015, 3:51 PM   Assessment/Plan: 81 yo M with parkinsons disease and  generalized weakness/ worsening symptoms in the setting of a urinary tract infection. I suspect that this is the culprit for his worsening and would favor treatment of this with continuation of his previous dose of sinemet. I do not see any clearly localizing signs on his exam today to make me think that stroke is likely. I do think that prior to the past day or two, the gradually worsening symptoms were releated more to the parkinson's and incerasing to 1.5 tabs TID would be reasonable.   1) Sinemet 25-100 1.5 tabs TID 2) Treatment of UTI per IM 3) Will continue to follow.   Roland Rack, MD [facility] [phone]  If 7pm- 7am, please page neurology on call as listed in [place].

## 2015-06-23 NOTE — Telephone Encounter (Signed)
I called and personally spoke with the patient and his wife. For the past several weeks patient has been having persistent cough and over the past 24 hours, wife says that he has been having progressive functional decline.  He is having worsening shakes his body and developed new swallowing and speech difficulties.  He has also had worsening confusion and difficulty walking.  He is able to stand with a walker, but fears walking because of weakness. Symptoms have started gradually and not improved.  Patient self titrated his Sinemet to one tablet 5 times daily yesterday and did not notice any improvement.  He has multiple other medical comorbidities including bladder cancer and pleural effusion, recently improved. His new symptoms of dysphagia, dysarthria, and mental status changes, stroke needs to be excluded. Note, he is on Coumadin for atrial fibrillation. His last INR was therapeutic.  Although these symptoms can be a manifestation of Parkinson's disease, I would expect him more gradual onset. Other possibilities include infection such as aspiration pneumonia, which would may also manifest with worsening Parkinson's disease symptoms are due to underlying infection.  Because of the acute change in his clinical status, I have urged him to call 911. He does not wish to call 911 and will have his wife transport him to the closest emergency department.  Donika K. Posey Pronto, DO

## 2015-06-24 ENCOUNTER — Inpatient Hospital Stay (HOSPITAL_COMMUNITY): Payer: Medicare Other

## 2015-06-24 DIAGNOSIS — N390 Urinary tract infection, site not specified: Principal | ICD-10-CM

## 2015-06-24 DIAGNOSIS — L8990 Pressure ulcer of unspecified site, unspecified stage: Secondary | ICD-10-CM | POA: Insufficient documentation

## 2015-06-24 LAB — GLUCOSE, CAPILLARY
GLUCOSE-CAPILLARY: 37 mg/dL — AB (ref 65–99)
GLUCOSE-CAPILLARY: 55 mg/dL — AB (ref 65–99)
GLUCOSE-CAPILLARY: 82 mg/dL (ref 65–99)
GLUCOSE-CAPILLARY: 99 mg/dL (ref 65–99)
GLUCOSE-CAPILLARY: 143 mg/dL — AB (ref 65–99)
Glucose-Capillary: 99 mg/dL (ref 65–99)
Glucose-Capillary: 142 mg/dL — ABNORMAL HIGH (ref 65–99)

## 2015-06-24 LAB — CBC
HEMATOCRIT: 31 % — AB (ref 39.0–52.0)
Hemoglobin: 9.6 g/dL — ABNORMAL LOW (ref 13.0–17.0)
MCH: 27.6 pg (ref 26.0–34.0)
MCHC: 31 g/dL (ref 30.0–36.0)
MCV: 89.1 fL (ref 78.0–100.0)
PLATELETS: 201 10*3/uL (ref 150–400)
RBC: 3.48 MIL/uL — ABNORMAL LOW (ref 4.22–5.81)
RDW: 16.8 % — ABNORMAL HIGH (ref 11.5–15.5)
WBC: 6.8 10*3/uL (ref 4.0–10.5)

## 2015-06-24 LAB — BASIC METABOLIC PANEL
ANION GAP: 8 (ref 5–15)
BUN: 65 mg/dL — AB (ref 6–20)
CALCIUM: 9.9 mg/dL (ref 8.9–10.3)
CO2: 29 mmol/L (ref 22–32)
CREATININE: 2.55 mg/dL — AB (ref 0.61–1.24)
Chloride: 106 mmol/L (ref 101–111)
GFR calc Af Amer: 26 mL/min — ABNORMAL LOW (ref >60)
GFR, EST NON AFRICAN AMERICAN: 22 mL/min — AB (ref >60)
Glucose, Bld: 96 mg/dL (ref 65–99)
Potassium: 4.1 mmol/L (ref 3.5–5.1)
SODIUM: 143 mmol/L (ref 135–145)

## 2015-06-24 LAB — T4, FREE: Free T4: 0.84 ng/dL (ref 0.61–1.12)

## 2015-06-24 LAB — PROTIME-INR
INR: 2.51 — ABNORMAL HIGH (ref 0.00–1.49)
PROTHROMBIN TIME: 26.8 s — AB (ref 11.6–15.2)

## 2015-06-24 MED ORDER — WARFARIN SODIUM 5 MG PO TABS
5.0000 mg | ORAL_TABLET | Freq: Once | ORAL | Status: AC
  Administered 2015-06-24: 5 mg via ORAL
  Filled 2015-06-24: qty 1

## 2015-06-24 MED ORDER — INSULIN GLARGINE 100 UNIT/ML ~~LOC~~ SOLN
40.0000 [IU] | Freq: Every day | SUBCUTANEOUS | Status: DC
  Administered 2015-06-24 – 2015-06-26 (×2): 40 [IU] via SUBCUTANEOUS
  Filled 2015-06-24 (×3): qty 0.4

## 2015-06-24 MED ORDER — DEXTROSE-NACL 5-0.9 % IV SOLN
INTRAVENOUS | Status: DC
  Administered 2015-06-24 – 2015-06-25 (×4): via INTRAVENOUS
  Filled 2015-06-24: qty 1000

## 2015-06-24 NOTE — Progress Notes (Signed)
Subjective: Feeling much better. No difficulty with swallowing this AM  Exam: Filed Vitals:   06/24/15 0505  BP: 108/78  Pulse: 87  Temp: 98.1 F (36.7 C)  Resp: 23    HEENT-  Normocephalic, no lesions, without obvious abnormality.  Normal external eye and conjunctiva.  Normal TM's bilaterally.  Normal auditory canals and external ears. Normal external nose, mucus membranes and septum.  Normal pharynx. Cardiovascular- irregularly irregular rhythm, pulses palpable throughout   Lungs- chest clear, no wheezing, rales, normal symmetric air entry,  Abdomen- normal findings: bowel sounds normal     Gen: In bed, NAD MS:  AOX4, speech clear, follows all commands.  CN: Perrla, EOMI, TML, Face symmetrical Motor: Moving all extremities antigravity with normal tone and minimal tremor Sensory:grossely intact DTR:1+ throughout UE with no LE DTR  Pertinent Labs: Cr 2.55, BUN 65, Vit B12 3432, UA --Nirtite and leukocyte positive    Impression: 81 yo M with parkinsons disease and generalized weakness/ worsening symptoms in the setting of a urinary tract infection.   Recommendations: 1) Continue Sinemet 25-100 at 1.5 tabs TID 2) Continue to treat UTI 3) follow up with Dr. Posey Pronto as out patient.   Neurology will S/O  Etta Quill PA-C [facility] [phone]  06/24/2015, 10:34 AM

## 2015-06-24 NOTE — Progress Notes (Addendum)
Hypoglycemic Event  CBG: 37  Treatment: 15 GM carbohydrate snack  Symptoms: Nervous/irritable  Follow-up CBG: RUEA:[id number]  CBG Result:  55  Up to 82 at third check  Possible Reasons for Event: Inadequate meal intake  Comments/MD notified:0800    [name] [name] Margaretha Sheffield  Remember to initiate Hypoglycemia Order Set & complete

## 2015-06-24 NOTE — Progress Notes (Signed)
ANTICOAGULATION CONSULT NOTE - Follow Up Consult  Pharmacy Consult for Warfarin Indication: atrial fibrillation  Allergies  Allergen Reactions  . Penicillins Rash    Happened 60 years ago    Patient Measurements: Height: 6' (182.9 cm) Weight: 222 lb 3.6 oz (100.8 kg) IBW/kg (Calculated) : 77.6  Vital Signs: Temp: 98.1 F (36.7 C) (07/20 0505) Temp Source: Oral (07/20 0505) BP: 108/78 mmHg (07/20 0505) Pulse Rate: 87 (07/20 0505)  Labs:  Recent Labs  06/23/15 1247 06/24/15 0444  HGB 9.9* 9.6*  HCT 31.9* 31.0*  PLT 210 201  LABPROT 27.0* 26.8*  INR 2.54* 2.51*  CREATININE 2.65* 2.55*    Estimated Creatinine Clearance: 27.9 mL/min (by C-G formula based on Cr of 2.55).   Medical History: Past Medical History  Diagnosis Date  . Family history of malignant neoplasm of gastrointestinal tract   . Unspecified constipation   . Abnormal involuntary movements(781.0)   . Vitamin B12 deficiency   . Claudication   . Hypertrophy of prostate with urinary obstruction and other lower urinary tract symptoms (LUTS)   . Obesity   . Mixed hyperlipidemia   . Atrial fibrillation     --myoview 07/2006: not gated. No ischemia or infarct  Diastolic HF-- Echo 07/1190 ER60% mild AI/MR. RV mild to moderately dilated/ dysfunction. ? restrictive CM . RVSP 36  . Bladder polyps   . Kidney stones   . Unspecified sleep apnea     NPSG 05/14/2007- AHI 80.7/ hr  . OSA (obstructive sleep apnea)     "tried mask; couldn't use it; so I quit" (11/12/2014)  . Hypothyroidism   . History of hiatal hernia   . Daily headache     "I've had headaches all my life; get them almost daily" (11/12/2014)  . Stroke ~ 1985    "simple stroke" denies residual on 11/12/2014  . Arthritis     "just about qwhere"   . Pleural effusion 11/12/2014 admission  . Dysrhythmia     Afib  . Type II or unspecified type diabetes mellitus with neurological manifestations, not stated as uncontrolled   . Shortness of breath  dyspnea     walking distance or climbing stairs  . Neuromuscular disorder     Parkinson's / age 36 had growth per left shoulder/neck area that stopped blood flow to left arm was removed and has not no further problems.    Medications:  Prescriptions prior to admission  Medication Sig Dispense Refill Last Dose  . acetaminophen (TYLENOL) 500 MG tablet Take 500 mg by mouth every 6 (six) hours as needed for mild pain.   06/22/2015 at Unknown time  . carbidopa-levodopa (SINEMET IR) 25-100 MG per tablet Take 1 tablet by mouth 3 (three) times daily. 270 tablet 3 06/23/2015 at Unknown time  . Cyanocobalamin (VITAMIN B 12 PO) Take 1 tablet by mouth daily.   06/23/2015 at Unknown time  . diazepam (VALIUM) 2 MG tablet Take 2 mg by mouth every 6 (six) hours as needed for anxiety.   Past Week at Unknown time  . furosemide (LASIX) 20 MG tablet 1 tablet Monday, Wednesday, Friday only (Patient taking differently: Take 20 mg by mouth every Monday, Wednesday, and Friday. 1 tablet Monday, Wednesday, Friday only) 90 tablet 3 06/22/2015 at Unknown time  . gabapentin (NEURONTIN) 100 MG capsule TAKE 2 CAPSULES BY MOUTH EACH EVENING AND ONE CAPSULE AT BEDTIME (Patient taking differently: TAKE 2 CAPSULES BY MOUTH DAILY IN THE MORNING AND ONE CAPSULE AT BEDTIME) 60 capsule 5 06/23/2015  at Unknown time  . Insulin Glargine (LANTUS SOLOSTAR) 100 UNIT/ML Solostar Pen Inject 54 Units into the skin daily at 10 pm. 15 mL 1 06/22/2015 at Unknown time  . levothyroxine (SYNTHROID, LEVOTHROID) 100 MCG tablet TAKE 1 TABLET BY MOUTH ONCE DAILY 90 tablet 1 06/23/2015 at Unknown time  . lovastatin (MEVACOR) 20 MG tablet TAKE 1 TABLET BY MOUTH DAILY (Patient taking differently: TAKE 1 TABLET BY MOUTH DAILY AT BEDTIME) 30 tablet 5 06/22/2015 at Unknown time  . metoprolol (TOPROL-XL) 100 MG 24 hr tablet 1 half tablet daily (Patient taking differently: Take 50 mg by mouth every morning. 1 half tablet daily) 90 tablet 3 06/23/2015 at 800  . Polyethyl  Glycol-Propyl Glycol 0.4-0.3 % SOLN Apply 1 drop to eye daily as needed (dry eye).   UNKNOWN  . polyethylene glycol (MIRALAX) powder Take 17 g by mouth at bedtime. For regularity   06/22/2015 at Unknown time  . sertraline (ZOLOFT) 25 MG tablet Take 1 tablet (25 mg total) by mouth daily. 30 tablet 11 06/23/2015 at Unknown time  . Tamsulosin HCl (FLOMAX) 0.4 MG CAPS Take 0.4 mg by mouth daily.    06/23/2015 at Unknown time  . traMADol (ULTRAM) 50 MG tablet Take 50 mg by mouth every 6 (six) hours as needed for moderate pain or severe pain.   2-3 weeks ago  . warfarin (COUMADIN) 5 MG tablet Take 2.5-5 mg by mouth daily at 6 PM. TAKE 2.5mg  on Sun/Tues/Fri's and Take 5 mg on all other days   06/22/2015 at Unknown time   Scheduled:  . carbidopa-levodopa  1.5 tablet Oral TID  . cefTRIAXone (ROCEPHIN)  IV  1 g Intravenous Q24H  . furosemide  20 mg Oral Q M,W,F  . gabapentin  100 mg Oral BID  . insulin aspart  0-9 Units Subcutaneous TID WC  . insulin aspart  4 Units Subcutaneous TID WC  . insulin glargine  40 Units Subcutaneous QHS  . levothyroxine  100 mcg Oral QAC breakfast  . metoprolol  50 mg Oral q morning - 10a  . polyethylene glycol  17 g Oral QHS  . pravastatin  10 mg Oral q1800  . sertraline  25 mg Oral Daily  . tamsulosin  0.4 mg Oral Daily  . Warfarin - Pharmacist Dosing Inpatient   Does not apply q1800   Infusions:  . dextrose 5 % and 0.9% NaCl      Assessment: 81yo male presents with increasing tremor and difficulty speaking. Pharmacy is consulted to dose warfarin for atrial fibrillation. INR remains stable at 2.51, Hgb 9.6, Plt 201, sCr 2.55.  PTA Warfarin Dose: 2.5mg  Sun/Tues/Fri and 5mg  AODs with last dose 7/18  Goal of Therapy:  INR 2-3 Monitor platelets by anticoagulation protocol: Yes   Plan:  Warfarin 5mg  tonight x1 Daily INR/CBC Monitor s/sx of bleeding  Albertina Parr, PharmD., BCPS Clinical Pharmacist Pager [phone]

## 2015-06-24 NOTE — Progress Notes (Signed)
Utilization Review completed. Blakely Maranan RN BSN CM 

## 2015-06-24 NOTE — Progress Notes (Signed)
[facility] PROGRESS NOTE  [name] [name] EHM:[id number] DOB: May 26, 1931 DOA: 06/23/2015 PCP: Nyoka Cowden, MD  Assessment/Plan: Principal Problem:   UTI (lower urinary tract infection) Active Problems:   Parkinson's disease   CKD (chronic kidney disease) stage 4, GFR 15-29 ml/min   Generalized weakness   Anemia, chronic disease   Pressure ulcer    Generalized weakness -Suspect related to urinary tract infection in this elderly gentleman. TSH slightly elevated, check free T4 -Pending PT/OT evaluations.  Urinary tract infection -Continue Rocephin , follow urine culture Patient has a chronic indwelling Foley, followed by Dr. Sabra Heck Foley replaced 2 days ago y,  Infiltrate on chest x-ray Patient states that he recently had a right Pleurx catheter placed for a right pleural effusion Complaining of right-sided chest wall pain Obtain CT scan of the chest without contrast to rule out recurrent pleural effusion -Does not seem like definite pneumonia on imaging, he has no symptoms of pneumonia, as such will not treat at this time.  Chronic kidney disease stage IV  -creatinine seems to be at baseline or little improved. Follow  Parkinson's disease -Continue Sinemet. -Appreciate neurology recommendations. Speech therapy evaluation recommends regular diet with thin liquids  Anemia of chronic disease -Hemoglobin is at his baseline of around 9-1/2-10.    Atrial fibrillation  -rate controlled, anticoagulated on Coumadin, will ask pharmacy to dose. Follow INR  DVT prophylaxis -Anticoagulated on Coumadin.  CODE STATUS -Full code. Discussed with daughter at bedside.    Code Status:      Code Status Orders        Start     Ordered   06/23/15 1733  Full code   Continuous     06/23/15 1732     Family Communication: family updated about patient's clinical progress Disposition Plan:  Anticipate discharge tomorrow   Brief narrative: 81 year old  gentleman with history of Parkinson's disease, atrial fibrillation maintained on chronic anticoagulation with Coumadin, diabetes, hypertension, hypothyroidism among other comorbidities, who presents to the hospital today with the above-mentioned. Patient states that over the past 24 hours he has had increased tremors he states that even his tongue has been affected as having a little bit of difficulty swallowing that he says is because his "tongue won't stay still". He called his neurologist recommended he come to the hospital today for evaluation. In the ED he is found to have a urinary tract infection, a small infiltrate on chest x-ray that cannot exclude pneumonia. We are asked to admit him for further evaluation and management.   Consultants:  Neurology  Procedures:  None  Antibiotics: Anti-infectives    Start     Dose/Rate Route Frequency Ordered Stop   06/24/15 1600  cefTRIAXone (ROCEPHIN) 1 g in dextrose 5 % 50 mL IVPB - Premix     1 g 100 mL/hr over 30 Minutes Intravenous Every 24 hours 06/23/15 1732     06/23/15 1600  cefTRIAXone (ROCEPHIN) 1 g in dextrose 5 % 50 mL IVPB     1 g 100 mL/hr over 30 Minutes Intravenous  Once 06/23/15 1556 06/23/15 1702         HPI/Subjective: Patient complaining of right-sided chest wall pain, has a chronic indwelling Foley, changed 2 days ago  Objective: Filed Vitals:   06/23/15 1800 06/23/15 2120 06/24/15 0505 06/24/15 1112  BP: 124/75 115/64 108/78 111/70  Pulse: 99 90 87 59  Temp: 98.2 F (36.8 C) 98.2 F (36.8 C) 98.1 F (36.7 C)  TempSrc: Oral Oral Oral   Resp: 18 21 23    Height:      Weight:      SpO2: 96% 93% 93%     Intake/Output Summary (Last 24 hours) at 06/24/15 1239 Last data filed at 06/24/15 9449  Gross per 24 hour  Intake 1082.5 ml  Output   1275 ml  Net -192.5 ml    Exam:  General: No acute respiratory distress Lungs: Clear to auscultation bilaterally without wheezes or crackles Cardiovascular: Regular  rate and rhythm without murmur gallop or rub normal S1 and S2 Abdomen: Nontender, nondistended, soft, bowel sounds positive, no rebound, no ascites, no appreciable mass Extremities: No significant cyanosis, clubbing, or edema bilateral lower extremities     Data Review   Micro Results Recent Results (from the past 240 hour(s))  Urine culture     Status: None (Preliminary result)   Collection Time: 06/23/15  2:44 PM  Result Value Ref Range Status   Specimen Description URINE, CATHETERIZED  Final   Special Requests NONE  Final   Culture >=100,000 COLONIES/mL PROTEUS MIRABILIS  Final   Report Status PENDING  Incomplete    Radiology Reports Dg Chest 2 View  06/23/2015   CLINICAL DATA:  Cough, generalized weakness.  EXAM: CHEST  2 VIEW  COMPARISON:  03/11/2015  FINDINGS: Cardiomegaly with vascular congestion and diffuse interstitial prominence throughout the lungs which could reflect interstitial edema. There are low lung volumes with bibasilar opacities, left greater than right. Suspect small left effusion. No acute bony abnormality.  IMPRESSION: Cardiomegaly, vascular congestion and suspected mild interstitial edema.  Bibasilar opacities, left greater than right. This likely reflects atelectasis of the right base. Cannot exclude pneumonia in the left base.  Small effusion.   Electronically Signed   By: Rolm Baptise M.D.   On: 06/23/2015 13:09   Ct Head Wo Contrast  06/23/2015   CLINICAL DATA:  Cough, swallowing and speech difficulty, weakness  EXAM: CT HEAD WITHOUT CONTRAST  TECHNIQUE: Contiguous axial images were obtained from the base of the skull through the vertex without intravenous contrast.  COMPARISON:  12/16/2003  FINDINGS: No skull fracture is noted. Paranasal sinuses and mastoid air cells are unremarkable.  No intracranial hemorrhage, mass effect or midline shift. Stable atrophy. Stable periventricular chronic white matter disease. No definite acute cortical infarction. No mass  lesion is noted on this unenhanced scan. The gray and white-matter differentiation is preserved.  IMPRESSION: No acute intracranial abnormality. Stable atrophy and chronic white matter disease.   Electronically Signed   By: Lahoma Crocker M.D.   On: 06/23/2015 13:28   Ct Chest Wo Contrast  06/24/2015   CLINICAL DATA:  81 year old male with right side rib and chest pain. Initial encounter.  EXAM: CT CHEST WITHOUT CONTRAST  TECHNIQUE: Multidetector CT imaging of the chest was performed following the standard protocol without IV contrast.  COMPARISON:  Chest radiographs 06/23/2015 and earlier. Chest CT without contrast 01/14/2015.  FINDINGS: Lower lung volumes. Small to moderate bilateral layering pleural effusions have increased, greater on the left. There is new left lower lobe consolidation with air bronchograms. Central airways remain patent. In the right lower lobe there is patchy and occasionally nodular (series 3, image 36) peri-bronchovascular opacity. The upper lobes demonstrate primarily dependent opacity. This is asymmetrically increased along the left major fissure. The middle lobes are largely spared although there is a new irregular 6 mm nodular density in the right middle lobe on series 3, image 30.  Mediastinal lymph nodes  appear stable. Chronic calcified aortic and coronary artery atherosclerosis. Cardiomegaly appears stable with no pericardial effusion.  No axillary lymphadenopathy.  Stable noncontrast visualized upper abdominal viscera.  No acute osseous abnormality identified. Chronic right posterior lateral eighth and ninth rib fractures are stable. No acute right rib fracture identified.  IMPRESSION: 1. Left lower lobe pneumonia with left greater than right layering pleural effusions. 2. Patchy and nodular opacity also in the right lower lobe, and minimally involving the right middle lobe, suspicious for extension of infection. 3. Stable cardiomegaly, no pericardial effusion.   Electronically  Signed   By: Genevie Ann M.D.   On: 06/24/2015 12:29     CBC  Recent Labs Lab 06/23/15 1247 06/24/15 0444  WBC 6.2 6.8  HGB 9.9* 9.6*  HCT 31.9* 31.0*  PLT 210 201  MCV 89.4 89.1  MCH 27.7 27.6  MCHC 31.0 31.0  RDW 16.7* 16.8*  LYMPHSABS 1.3  --   MONOABS 0.4  --   EOSABS 0.2  --   BASOSABS 0.0  --     Chemistries   Recent Labs Lab 06/23/15 1247 06/24/15 0444  NA 141 143  K 4.4 4.1  CL 105 106  CO2 29 29  GLUCOSE 163* 96  BUN 69* 65*  CREATININE 2.65* 2.55*  CALCIUM 9.9 9.9  AST 25  --   ALT 7*  --   ALKPHOS 238*  --   BILITOT 0.8  --    ------------------------------------------------------------------------------------------------------------------ estimated creatinine clearance is 27.9 mL/min (by C-G formula based on Cr of 2.55). ------------------------------------------------------------------------------------------------------------------ No results for input(s): HGBA1C in the last 72 hours. ------------------------------------------------------------------------------------------------------------------ No results for input(s): CHOL, HDL, LDLCALC, TRIG, CHOLHDL, LDLDIRECT in the last 72 hours. ------------------------------------------------------------------------------------------------------------------  Recent Labs  06/23/15 1815  TSH 5.417*   ------------------------------------------------------------------------------------------------------------------  Recent Labs  06/23/15 1815  VITAMINB12 3432*    Coagulation profile  Recent Labs Lab 06/23/15 1247 06/24/15 0444  INR 2.54* 2.51*    No results for input(s): DDIMER in the last 72 hours.  Cardiac Enzymes No results for input(s): CKMB, TROPONINI, MYOGLOBIN in the last 168 hours.  Invalid input(s): CK ------------------------------------------------------------------------------------------------------------------ Invalid input(s): POCBNP   CBG:  Recent Labs Lab  06/23/15 2117 06/24/15 0748 06/24/15 0815 06/24/15 0850 06/24/15 1227  GLUCAP 143* 37* 55* 82 99  99       Studies: Dg Chest 2 View  06/23/2015   CLINICAL DATA:  Cough, generalized weakness.  EXAM: CHEST  2 VIEW  COMPARISON:  03/11/2015  FINDINGS: Cardiomegaly with vascular congestion and diffuse interstitial prominence throughout the lungs which could reflect interstitial edema. There are low lung volumes with bibasilar opacities, left greater than right. Suspect small left effusion. No acute bony abnormality.  IMPRESSION: Cardiomegaly, vascular congestion and suspected mild interstitial edema.  Bibasilar opacities, left greater than right. This likely reflects atelectasis of the right base. Cannot exclude pneumonia in the left base.  Small effusion.   Electronically Signed   By: Rolm Baptise M.D.   On: 06/23/2015 13:09   Ct Head Wo Contrast  06/23/2015   CLINICAL DATA:  Cough, swallowing and speech difficulty, weakness  EXAM: CT HEAD WITHOUT CONTRAST  TECHNIQUE: Contiguous axial images were obtained from the base of the skull through the vertex without intravenous contrast.  COMPARISON:  12/16/2003  FINDINGS: No skull fracture is noted. Paranasal sinuses and mastoid air cells are unremarkable.  No intracranial hemorrhage, mass effect or midline shift. Stable atrophy. Stable periventricular chronic white matter disease. No definite acute cortical infarction.  No mass lesion is noted on this unenhanced scan. The gray and white-matter differentiation is preserved.  IMPRESSION: No acute intracranial abnormality. Stable atrophy and chronic white matter disease.   Electronically Signed   By: Lahoma Crocker M.D.   On: 06/23/2015 13:28   Ct Chest Wo Contrast  06/24/2015   CLINICAL DATA:  81 year old male with right side rib and chest pain. Initial encounter.  EXAM: CT CHEST WITHOUT CONTRAST  TECHNIQUE: Multidetector CT imaging of the chest was performed following the standard protocol without IV contrast.   COMPARISON:  Chest radiographs 06/23/2015 and earlier. Chest CT without contrast 01/14/2015.  FINDINGS: Lower lung volumes. Small to moderate bilateral layering pleural effusions have increased, greater on the left. There is new left lower lobe consolidation with air bronchograms. Central airways remain patent. In the right lower lobe there is patchy and occasionally nodular (series 3, image 36) peri-bronchovascular opacity. The upper lobes demonstrate primarily dependent opacity. This is asymmetrically increased along the left major fissure. The middle lobes are largely spared although there is a new irregular 6 mm nodular density in the right middle lobe on series 3, image 30.  Mediastinal lymph nodes appear stable. Chronic calcified aortic and coronary artery atherosclerosis. Cardiomegaly appears stable with no pericardial effusion.  No axillary lymphadenopathy.  Stable noncontrast visualized upper abdominal viscera.  No acute osseous abnormality identified. Chronic right posterior lateral eighth and ninth rib fractures are stable. No acute right rib fracture identified.  IMPRESSION: 1. Left lower lobe pneumonia with left greater than right layering pleural effusions. 2. Patchy and nodular opacity also in the right lower lobe, and minimally involving the right middle lobe, suspicious for extension of infection. 3. Stable cardiomegaly, no pericardial effusion.   Electronically Signed   By: Genevie Ann M.D.   On: 06/24/2015 12:29      Lab Results  Component Value Date   HGBA1C 7.0* 05/20/2015   HGBA1C 6.6* 09/11/2014   HGBA1C 7.2* 06/05/2014   Lab Results  Component Value Date   MICROALBUR 4.8* 06/05/2014   LDLCALC 56 11/12/2013   CREATININE 2.55* 06/24/2015       Scheduled Meds: . carbidopa-levodopa  1.5 tablet Oral TID  . cefTRIAXone (ROCEPHIN)  IV  1 g Intravenous Q24H  . furosemide  20 mg Oral Q M,W,F  . gabapentin  100 mg Oral BID  . insulin aspart  0-9 Units Subcutaneous TID WC  .  insulin aspart  4 Units Subcutaneous TID WC  . insulin glargine  40 Units Subcutaneous QHS  . levothyroxine  100 mcg Oral QAC breakfast  . metoprolol  50 mg Oral q morning - 10a  . polyethylene glycol  17 g Oral QHS  . pravastatin  10 mg Oral q1800  . sertraline  25 mg Oral Daily  . tamsulosin  0.4 mg Oral Daily  . warfarin  5 mg Oral ONCE-1800  . Warfarin - Pharmacist Dosing Inpatient   Does not apply q1800   Continuous Infusions: . dextrose 5 % and 0.9% NaCl      Principal Problem:   UTI (lower urinary tract infection) Active Problems:   Parkinson's disease   CKD (chronic kidney disease) stage 4, GFR 15-29 ml/min   Generalized weakness   Anemia, chronic disease   Pressure ulcer    Time spent: 45 minutes   [facility] Pager [phone]. If 7PM-7AM, please contact night-coverage at [email or web address], password [facility] 06/24/2015, 12:39 PM  LOS: 1 day

## 2015-06-24 NOTE — Evaluation (Signed)
Physical Therapy Evaluation Patient Details Name: [name] [name] MRN: [id number] DOB: 06/09/1931 Today's Date: 06/24/2015   History of Present Illness  Patient is a very pleasant 81 year old gentleman with history of Parkinson's disease, atrial fibrillation maintained on chronic anticoagulation with Coumadin, diabetes, hypertension, hypothyroidism among other comorbidities, who presents to the hospital today with the above-mentioned. Patient states that over the past 24 hours he has had increased tremors he states that even his tongue has been affected as having a little bit of difficulty swallowing that he says is because his "tongue won't stay still". He called his neurologist recommended he come to the hospital today for evaluation. In the ED he is found to have a urinary tract infection, a small infiltrate on chest x-ray that cannot exclude pneumonia.  Clinical Impression   Pt admitted with above diagnosis. Pt currently with functional limitations due to the deficits listed below (see PT Problem List).  Pt will benefit from skilled PT to increase their independence and safety with mobility to allow discharge to the venue listed below.       Follow Up Recommendations Home health PT;Supervision/Assistance - 24 hour    Equipment Recommendations  3in1 (PT)    Recommendations for Other Services OT consult     Precautions / Restrictions Precautions Precautions: Fall Restrictions Weight Bearing Restrictions: No      Mobility  Bed Mobility Overal bed mobility: Needs Assistance Bed Mobility: Supine to Sit     Supine to sit: Min assist     General bed mobility comments: min handheld assist to pull to sit  Transfers Overall transfer level: Needs assistance Equipment used: Rolling walker (2 wheeled) Transfers: Sit to/from Stand Sit to Stand: Min assist         General transfer comment: Min assist to steady at initial stand; cues for hand placement; Bed elevated to approximate  lift chair at home  Ambulation/Gait Ambulation/Gait assistance: Min guard Ambulation Distance (Feet): 12 Feet Assistive device: Rolling walker (2 wheeled) Gait Pattern/deviations: Step-through pattern;Decreased stride length;Trunk flexed     General Gait Details: Heavy dependence on UEs for support  Stairs            Wheelchair Mobility    Modified Rankin (Stroke Patients Only)       Balance Overall balance assessment: Needs assistance         Standing balance support: Bilateral upper extremity supported Standing balance-Leahy Scale: Poor                               Pertinent Vitals/Pain Pain Assessment: 0-10 Pain Score: 2  Pain Location: abdomen Pain Descriptors / Indicators: Discomfort Pain Intervention(s): Monitored during session;Repositioned    Home Living Family/patient expects to be discharged to:: Private residence Living Arrangements: Spouse/significant other Available Help at Discharge: Family;Available 24 hours/day Type of Home: House Home Access: Level entry;Stairs to enter Entrance Stairs-Rails: None Entrance Stairs-Number of Steps: 1 Home Layout: One level Home Equipment: Walker - 4 wheels;Cane - single point;Hospital bed;Wheelchair - manual Brewing technologist)      Prior Function Level of Independence: Needs assistance   Gait / Transfers Assistance Needed: Uses SPC full-time and RW prn  ADL's / Homemaking Assistance Needed: Wife assists with dressing and bathing. Wife drives.        Hand Dominance   Dominant Hand: Right    Extremity/Trunk Assessment   Upper Extremity Assessment: Defer to OT evaluation (tremor noted)  Lower Extremity Assessment: Generalized weakness      Cervical / Trunk Assessment: Normal  Communication   Communication: No difficulties  Cognition Arousal/Alertness: Awake/alert Behavior During Therapy: WFL for tasks assessed/performed Overall Cognitive Status: Within Functional Limits  for tasks assessed                      General Comments General comments (skin integrity, edema, etc.): Worked on upright posture and scapular retraction in recliner    Exercises        Assessment/Plan    PT Assessment Patient needs continued PT services  PT Diagnosis Difficulty walking;Generalized weakness   PT Problem List Decreased strength;Decreased range of motion  PT Treatment Interventions DME instruction;Gait training;Stair training;Functional mobility training;Therapeutic activities;Therapeutic exercise;Balance training;Neuromuscular re-education;Patient/family education   PT Goals (Current goals can be found in the Care Plan section) Acute Rehab PT Goals Patient Stated Goal: wants to walk better; is interested in HEP PT Goal Formulation: With patient Time For Goal Achievement: 07/08/15 Potential to Achieve Goals: Good    Frequency Min 3X/week   Barriers to discharge        Co-evaluation               End of Session Equipment Utilized During Treatment: Gait belt Activity Tolerance: Patient tolerated treatment well Patient left: in chair;with call bell/phone within reach;with chair alarm set Nurse Communication: Mobility status         Time: 1002-1030 PT Time Calculation (min) (ACUTE ONLY): 28 min   Charges:   PT Evaluation $Initial PT Evaluation Tier I: 1 Procedure PT Treatments $Gait Training: 8-22 mins   PT G CodesQuin Hoop 06/24/2015, 10:41 AM  Roney Marion, [place] Pager [phone] Office [phone]

## 2015-06-24 NOTE — Care Management Note (Signed)
Case Management Note  Patient Details  Name: [name] [name] MRN: [id number] Date of Birth: [birth year]  Subjective/Objective:                 Patient lives at home with wife. Patient states that he has a Development worker, community, a hospital bed at home but, "likes ours better," and that his wife drives him to his appointments. He denies needing any further home equipment. Will await PT recommendation on home therapies.    Action/Plan:  Discharge to home with wife. Patient has a recent SNF discharge, will anticipate [facility] nursing etc.  Expected Discharge Date:                  Expected Discharge Plan:  Home/Self Care  In-House Referral:  Clinical Social Work  Discharge planning Services  CM Consult  Post Acute Care Choice:    Choice offered to:     DME Arranged:    DME Agency:     HH Arranged:    [place] Agency:     Status of Service:  In process, will continue to follow  Medicare Important Message Given:    Date Medicare IM Given:    Medicare IM give by:    Date Additional Medicare IM Given:    Additional Medicare Important Message give by:     If discussed at [place] of Stay Meetings, dates discussed:    Additional Comments:  Carles Collet, RN 06/24/2015, 11:28 AM

## 2015-06-24 NOTE — Evaluation (Addendum)
Occupational Therapy Evaluation Patient Details Name: [name] [name] MRN: [id number] DOB: 10-13-1931 Today's Date: 06/24/2015    History of Present Illness Patient is a very pleasant 81 year old gentleman with history of Parkinson's disease, atrial fibrillation maintained on chronic anticoagulation with Coumadin, diabetes, hypertension, hypothyroidism among other comorbidities, who presents to the hospital today with the above-mentioned. Patient states that over the past 24 hours he has had increased tremors he states that even his tongue has been affected as having a little bit of difficulty swallowing that he says is because his "tongue won't stay still". He called his neurologist recommended he come to the hospital today for evaluation. In the ED he is found to have a urinary tract infection, a small infiltrate on chest x-ray that cannot exclude pneumonia.   Clinical Impression   Pt admitted with above. Pt reported to OT that he has been getting supervision for showering and shower transfer, but per PT eval, wife has been assisting with bathing/dressing as well. Feel pt will benefit from acute OT to increase independence prior to d/c. Recommending HHOT upon d/c. Plan to practice with AE next session (if still needed).    Follow Up Recommendations  Home health OT;Supervision/Assistance - 24 hour    Equipment Recommendations  Other (comment);3 in 1 bedside comode (AE)    Recommendations for Other Services       Precautions / Restrictions Precautions Precautions: Fall Restrictions Weight Bearing Restrictions: No      Mobility Bed Mobility Overal bed mobility: Needs Assistance Bed Mobility: Supine to Sit     Supine to sit: Supervision        Transfers Overall transfer level: Needs assistance   Transfers: Sit to/from Stand Sit to Stand: Min assist         General transfer comment: Initially requiring more assist for sit to stand and did not make it to standing  position. Pt then able to stand Min assist on second attempt from bed. Cues for hand placement.    Balance Overall balance assessment: History of Falls    Min guard for ambulation with RW. Pt able to reach down to doff socks sitting EOB without LOB.                                      ADL Overall ADL's : Needs assistance/impaired          Grooming: Setup; Sitting       Upper Body Dressing : Minimal assistance;Sitting   Lower Body Dressing: Moderate assistance;Sit to/from stand   Toilet Transfer: Min guard;Ambulation;Minimal assistance;RW (Min guard for ambulation)           Functional mobility during ADLs: Min guard;Rolling walker General ADL Comments: Educated on safety such as rugs/items on floor at home. Explained there is AE available to assist with LB dressing.  suggested bracing/hugging a pillow when he coughs/laughs due to rib pain.     Vision  Pt reports he has poor vision (has been getting dimmer, but reports nothing new from baseline). Pt wears glasses most of the time.    Perception     Praxis      Pertinent Vitals/Pain Pain Assessment: 0-10 Pain Score:  (2-3) Pain Location: right chest/side area (reports hurts when he coughs/takes a deep breath)  Pain Intervention(s): Monitored during session     Hand Dominance Right   Extremity/Trunk Assessment Upper Extremity Assessment Upper Extremity Assessment: RUE  deficits/detail;LUE deficits/detail RUE Deficits / Details: pt only able to tolerate minimal resistance with shoulder flexion; limited ROM in right shoulder-reports falling on this side LUE Deficits / Details: tremors noted in left hand LUE Coordination: decreased fine motor   Lower Extremity Assessment Lower Extremity Assessment: Defer to PT evaluation       Communication Communication Communication: No difficulties   Cognition Arousal/Alertness: Awake/alert Behavior During Therapy: WFL for tasks assessed/performed Overall  Cognitive Status: Within Functional Limits for tasks assessed                     General Comments       Exercises       Shoulder Instructions      Home Living Family/patient expects to be discharged to:: Private residence Living Arrangements: Spouse/significant other Available Help at Discharge: Family;Available 24 hours/day Type of Home: House Home Access: Level entry;Stairs to enter Entrance Stairs-Number of Steps: 1 Entrance Stairs-Rails: None Home Layout: One level     Bathroom Shower/Tub: Walk-in shower and tub/shower (uses walk-in shower) Commode: regular height          Home Equipment: Environmental consultant - 4 wheels;Cane - single point;Hospital bed;Wheelchair - Psychologist, educational;Shower seat (lift chair) Adaptive Equipment: Long-handled shoe horn        Prior Functioning/Environment Level of Independence: Needs assistance  Gait / Transfers Assistance Needed: Uses SPC full-time and RW prn ADL's / Homemaking Assistance Needed: wife drives; pt reports he has been getting supervision for shower transfer/showering. Per PT eval, wife assisting with bathing/dressing?        OT Diagnosis: Generalized weakness   OT Problem List: Decreased strength;Decreased range of motion;Pain;Decreased coordination;Decreased knowledge of use of DME or AE;Decreased knowledge of precautions;Impaired UE functional use;Decreased activity tolerance;Impaired vision/perception   OT Treatment/Interventions: Self-care/ADL training;DME and/or AE instruction;Therapeutic activities;Patient/family education;Balance training;Therapeutic exercise;Energy conservation    OT Goals(Current goals can be found in the care plan section) Acute Rehab OT Goals Patient Stated Goal: not stated OT Goal Formulation: With patient Time For Goal Achievement: 07/01/15 Potential to Achieve Goals: Good ADL Goals Pt Will Perform Lower Body Dressing: with set-up;with supervision;with adaptive equipment;sit to/from  stand Pt Will Transfer to Toilet: with supervision;ambulating (3 in 1 over commode) Pt Will Perform Toileting - Clothing Manipulation and hygiene: with supervision;sit to/from stand  OT Frequency: Min 2X/week   Barriers to D/C:            Co-evaluation              End of Session Equipment Utilized During Treatment: Gait belt;Rolling walker Nurse Communication: Other (comment) (in chair with alarm set)  Activity Tolerance: Patient tolerated treatment well Patient left: in chair;with call bell/phone within reach;with chair alarm set   Time: 1444-1501 OT Time Calculation (min): 17 min Charges:  OT General Charges $OT Visit: 1 Procedure OT Evaluation $Initial OT Evaluation Tier I: 1 Procedure G-CodesBenito Mccreedy OTR/L [id number] 06/24/2015, 3:23 PM

## 2015-06-24 NOTE — Evaluation (Signed)
Clinical/Bedside Swallow Evaluation Patient Details  Name: [name] [name] MRN: [id number] Date of Birth: Oct 26, 1931  Today's Date: 06/24/2015 Time: SLP Start Time (ACUTE ONLY): 1031 SLP Stop Time (ACUTE ONLY): 1049 SLP Time Calculation (min) (ACUTE ONLY): 18 min  Past Medical History:  Past Medical History  Diagnosis Date  . Family history of malignant neoplasm of gastrointestinal tract   . Unspecified constipation   . Abnormal involuntary movements(781.0)   . Vitamin B12 deficiency   . Claudication   . Hypertrophy of prostate with urinary obstruction and other lower urinary tract symptoms (LUTS)   . Obesity   . Mixed hyperlipidemia   . Atrial fibrillation     --myoview 07/2006: not gated. No ischemia or infarct  Diastolic HF-- Echo 04/8849 ER60% mild AI/MR. RV mild to moderately dilated/ dysfunction. ? restrictive CM . RVSP 36  . Bladder polyps   . Kidney stones   . Unspecified sleep apnea     NPSG 05/14/2007- AHI 80.7/ hr  . OSA (obstructive sleep apnea)     "tried mask; couldn't use it; so I quit" (11/12/2014)  . Hypothyroidism   . History of hiatal hernia   . Daily headache     "I've had headaches all my life; get them almost daily" (11/12/2014)  . Stroke ~ 1985    "simple stroke" denies residual on 11/12/2014  . Arthritis     "just about qwhere"   . Pleural effusion 11/12/2014 admission  . Dysrhythmia     Afib  . Type II or unspecified type diabetes mellitus with neurological manifestations, not stated as uncontrolled   . Shortness of breath dyspnea     walking distance or climbing stairs  . Neuromuscular disorder     Parkinson's / age 77 had growth per left shoulder/neck area that stopped blood flow to left arm was removed and has not no further problems.   Past Surgical History:  Past Surgical History  Procedure Laterality Date  . Appendectomy    . Kidney stone surgery      Kidney stone retrieval with uretal stent x 2   . Bladder polyps      many times  last time  12/15  . Transurethral resection of bladder    . Orif elbow fracture Left 1976  . Fracture surgery    . Excisional hemorrhoidectomy  1950's  . Ureteral stent placement  X 1  . Cystoscopy w/ stone manipulation  X 2  . Cataract extraction w/ intraocular lens  implant, bilateral Bilateral   . Eye surgery Bilateral     catarct  . Chest tube insertion Right 12/12/2014    Procedure: INSERTION PLEURAL DRAINAGE CATHETER;  Surgeon: Ivin Poot, MD;  Location: [facility];  Service: Thoracic;  Laterality: Right;  . Colonscopy       removal of polyps  . Cystoscopy N/A 01/15/2015    Procedure: CYSTOSCOPY;  Surgeon: Bernestine Amass, MD;  Location: [facility];  Service: Urology;  Laterality: N/A;  . Transurethral resection of bladder tumor N/A 01/15/2015    Procedure: TRANSURETHRAL RESECTION OF BLADDER TUMOR (TURBT);  Surgeon: Bernestine Amass, MD;  Location: [facility];  Service: Urology;  Laterality: N/A;  . Removal of pleural drainage catheter Right 02/23/2015    Procedure: REMOVAL OF PLEURAL DRAINAGE CATHETER;  Surgeon: Ivin Poot, MD;  Location: [facility];  Service: Thoracic;  Laterality: Right;   HPI:  Patient is a very pleasant 81 year old gentleman with history of Parkinson's disease, atrial fibrillation maintained on chronic  anticoagulation with Coumadin, diabetes, hypertension, hypothyroidism among other comorbidities, who presents to the hospital today with the above-mentioned. Patient states that over the past 24 hours he has had increased tremors he states that even his tongue has been affected as having a little bit of difficulty swallowing that he says is because his "tongue won't stay still". He called his neurologist recommended he come to the hospital today for evaluation. In the ED he is found to have a urinary tract infection, a small infiltrate on chest x-ray that cannot exclude pneumonia, although per MD note PNA is not definitive and he is without clinical signs, therefore not being treated.    Assessment / Plan / Recommendation Clinical Impression  Pt's swallow appears swift and with adequate oral clearance. Delayed throat clear was noted x1 across all trials, but without changes in vocal quality noted. RN does report some coughing this morning, although pt believes his swallow function has returned to baseline. Recommend to continue regular diet and thin liquids with brief SLP f/u for tolerance as pt remains in house.    Aspiration Risk  Mild    Diet Recommendation Age appropriate regular solids;Thin   Medication Administration: Whole meds with liquid Compensations: Slow rate;Small sips/bites    Other  Recommendations Oral Care Recommendations: Oral care BID   Frequency and Duration min 1 x/week  1 week   Pertinent Vitals/Pain n/a    SLP Swallow Goals     Swallow Study Prior Functional Status  Type of Home: House Available Help at Discharge: Family;Available 24 hours/day    General Other Pertinent Information: Patient is a very pleasant 81 year old gentleman with history of Parkinson's disease, atrial fibrillation maintained on chronic anticoagulation with Coumadin, diabetes, hypertension, hypothyroidism among other comorbidities, who presents to the hospital today with the above-mentioned. Patient states that over the past 24 hours he has had increased tremors he states that even his tongue has been affected as having a little bit of difficulty swallowing that he says is because his "tongue won't stay still". He called his neurologist recommended he come to the hospital today for evaluation. In the ED he is found to have a urinary tract infection, a small infiltrate on chest x-ray that cannot exclude pneumonia, although per MD note PNA is not definitive and he is without clinical signs, therefore not being treated. Type of Study: Bedside swallow evaluation Previous Swallow Assessment: none in chart Diet Prior to this Study: Regular;Thin liquids Temperature Spikes  Noted: No Respiratory Status: Room air History of Recent Intubation: No Behavior/Cognition: Alert;Cooperative;Pleasant mood Oral Cavity - Dentition: Poor condition Self-Feeding Abilities: Able to feed self Patient Positioning: Upright in chair/Tumbleform Baseline Vocal Quality: Normal Volitional Swallow: Able to elicit    Oral/Motor/Sensory Function     Ice Chips Ice chips: Not tested   Thin Liquid Thin Liquid: Impaired Presentation: Cup;Self Fed;Straw Pharyngeal  Phase Impairments: Throat Clearing - Delayed (x1)    Nectar Thick Nectar Thick Liquid: Not tested   Honey Thick Honey Thick Liquid: Not tested   Puree Puree: Within functional limits Presentation: Self Fed;Spoon   Solid   Solid: Within functional limits Presentation: Self Fed      Germain Osgood, M.A. CCC-SLP [phone]  Germain Osgood 06/24/2015,10:58 AM

## 2015-06-25 LAB — URINE CULTURE

## 2015-06-25 LAB — COMPREHENSIVE METABOLIC PANEL
ALBUMIN: 2.7 g/dL — AB (ref 3.5–5.0)
ALK PHOS: 204 U/L — AB (ref 38–126)
ALT: 6 U/L — AB (ref 17–63)
AST: 18 U/L (ref 15–41)
Anion gap: 7 (ref 5–15)
BILIRUBIN TOTAL: 0.4 mg/dL (ref 0.3–1.2)
BUN: 60 mg/dL — AB (ref 6–20)
CO2: 27 mmol/L (ref 22–32)
CREATININE: 2.44 mg/dL — AB (ref 0.61–1.24)
Calcium: 9.6 mg/dL (ref 8.9–10.3)
Chloride: 108 mmol/L (ref 101–111)
GFR, EST AFRICAN AMERICAN: 27 mL/min — AB (ref >60)
GFR, EST NON AFRICAN AMERICAN: 23 mL/min — AB (ref >60)
Glucose, Bld: 103 mg/dL — ABNORMAL HIGH (ref 65–99)
Potassium: 4.1 mmol/L (ref 3.5–5.1)
Sodium: 142 mmol/L (ref 135–145)
Total Protein: 6.8 g/dL (ref 6.5–8.1)

## 2015-06-25 LAB — CBC
HEMATOCRIT: 30.6 % — AB (ref 39.0–52.0)
HEMOGLOBIN: 9.5 g/dL — AB (ref 13.0–17.0)
MCH: 27.5 pg (ref 26.0–34.0)
MCHC: 31 g/dL (ref 30.0–36.0)
MCV: 88.4 fL (ref 78.0–100.0)
Platelets: 191 10*3/uL (ref 150–400)
RBC: 3.46 MIL/uL — ABNORMAL LOW (ref 4.22–5.81)
RDW: 16.7 % — AB (ref 11.5–15.5)
WBC: 6.6 10*3/uL (ref 4.0–10.5)

## 2015-06-25 LAB — GLUCOSE, CAPILLARY
GLUCOSE-CAPILLARY: 93 mg/dL (ref 65–99)
GLUCOSE-CAPILLARY: 132 mg/dL — AB (ref 65–99)
GLUCOSE-CAPILLARY: 102 mg/dL — AB (ref 65–99)
GLUCOSE-CAPILLARY: 169 mg/dL — AB (ref 65–99)

## 2015-06-25 LAB — PROTIME-INR
INR: 2.53 — ABNORMAL HIGH (ref 0.00–1.49)
PROTHROMBIN TIME: 26.9 s — AB (ref 11.6–15.2)

## 2015-06-25 MED ORDER — AZITHROMYCIN 500 MG PO TABS
500.0000 mg | ORAL_TABLET | Freq: Every day | ORAL | Status: DC
  Administered 2015-06-25 – 2015-06-26 (×2): 500 mg via ORAL
  Filled 2015-06-25 (×3): qty 1

## 2015-06-25 MED ORDER — WARFARIN SODIUM 2.5 MG PO TABS
2.5000 mg | ORAL_TABLET | ORAL | Status: DC
  Filled 2015-06-25: qty 1

## 2015-06-25 MED ORDER — WARFARIN SODIUM 5 MG PO TABS
5.0000 mg | ORAL_TABLET | ORAL | Status: DC
  Administered 2015-06-25: 5 mg via ORAL
  Filled 2015-06-25: qty 1

## 2015-06-25 NOTE — Progress Notes (Signed)
ANTICOAGULATION CONSULT NOTE - Follow Up Consult  Pharmacy Consult for Coumadin Indication: atrial fibrillation  Allergies  Allergen Reactions  . Penicillins Rash    Happened 60 years ago; Tolerates Cephs    Patient Measurements: Height: 6' (182.9 cm) Weight: 222 lb 3.6 oz (100.8 kg) IBW/kg (Calculated) : 77.6   Vital Signs: Temp: 98 F (36.7 C) (07/21 0533) Temp Source: Oral (07/21 0533) BP: 114/65 mmHg (07/21 0533) Pulse Rate: 81 (07/21 0533)  Labs:  Recent Labs  06/23/15 1247 06/24/15 0444 06/25/15 0404  HGB 9.9* 9.6* 9.5*  HCT 31.9* 31.0* 30.6*  PLT 210 201 191  LABPROT 27.0* 26.8* 26.9*  INR 2.54* 2.51* 2.53*  CREATININE 2.65* 2.55* 2.44*    Estimated Creatinine Clearance: 29.2 mL/min (by C-G formula based on Cr of 2.44). This seems to be his baseline   Assessment: Restarted on home dose of coumadin of 2.5mg  on sun/Tues/Friday and 5mg  AODs   INR 2.53  Goal of Therapy:  INR 2-3 Monitor platelets by anticoagulation protocol: Yes   Plan: monitor INR and CBC Q72 hours Monitor for s/s of bleeding   Lacole Komorowski C. Lennox Grumbles, PharmD Pharmacy Resident  Pager: [phone]

## 2015-06-25 NOTE — Progress Notes (Addendum)
Speech Language Pathology Treatment: Dysphagia  Patient Details Name: [name] [name] MRN: [id number] DOB: Dec 03, 1931 Today's Date: 06/25/2015 Time: 1457-1510 SLP Time Calculation (min) (ACUTE ONLY): 13 min  Assessment / Plan / Recommendation Clinical Impression  Skilled treatment session focused on addressing dysphagia goals.  SLP provided skilled observation of patient self-feeding regular textures and thin liquids with a subtle throat clear x1 Mod I.  Significant improvement as compared to yesterday's session; goals met.  Education completed regarding s/s of aspiration to watch for, patient verbalized understanding.  SLP signing off.      HPI Other Pertinent Information: Patient is a very pleasant 81 year old gentleman with history of Parkinson's disease, atrial fibrillation maintained on chronic anticoagulation with Coumadin, diabetes, hypertension, hypothyroidism among other comorbidities, who presents to the hospital today with the above-mentioned. Patient states that over the past 24 hours he has had increased tremors he states that even his tongue has been affected as having a little bit of difficulty swallowing that he says is because his "tongue won't stay still". He called his neurologist recommended he come to the hospital today for evaluation. In the ED he is found to have a urinary tract infection, a small infiltrate on chest x-ray that cannot exclude pneumonia, although per MD note PNA is not definitive and he is without clinical signs, therefore not being treated.   Pertinent Vitals Pain Assessment: No/denies pain  SLP Plan  All goals met    Recommendations Diet recommendations: Regular;Thin liquid Medication Administration: Whole meds with liquid Compensations: Slow rate;Small sips/bites Postural Changes and/or Swallow Maneuvers: Seated upright 90 degrees              Oral Care Recommendations: Oral care BID Follow up Recommendations: None Plan: All goals met    GO     [name] [name]., CCC-SLP [phone]  [name] [name] 06/25/2015, 3:33 PM

## 2015-06-25 NOTE — Care Management Note (Signed)
Case Management Note  Patient Details  Name: [name] [name] MRN: [id number] Date of Birth: 05/13/1931  Subjective/Objective:                 Patient lives at home with wife. Patient states that he has a Engineer, civil (consulting), a hospital bed at home but, "likes ours better," and that his wife drives him to his appointments. He denies needing any further home equipment. Will await PT recommendation on home therapies.     Action/Plan:  Home health RN Aide, PT OT set up with [facility], referral made to[facility] 06-25-15.   Expected Discharge Date:                  Expected Discharge Plan:  [place]  In-House Referral:  Clinical Social Work  Discharge planning Services  CM Consult  Post Acute Care Choice:  Home Health Choice offered to:  Patient  DME Arranged:    DME Agency:     HH Arranged:  RN, PT, OT, Nurse's Aide [place] Agency:     Status of Service:  Completed, signed off  Medicare Important Message Given:    Date Medicare IM Given:    Medicare IM give by:    Date Additional Medicare IM Given:    Additional Medicare Important Message give by:     If discussed at [place] of Stay Meetings, dates discussed:    Additional Comments:  Carles Collet, RN 06/25/2015, 12:00 PM

## 2015-06-25 NOTE — Progress Notes (Addendum)
[facility] PROGRESS NOTE  [name] [name] INO:[id number] DOB: March 06, 1931 DOA: 06/23/2015 PCP: Nyoka Cowden, MD  Assessment/Plan: Principal Problem:   UTI (lower urinary tract infection) Active Problems:   Parkinson's disease   CKD (chronic kidney disease) stage 4, GFR 15-29 ml/min   Generalized weakness   Anemia, chronic disease   Pressure ulcer    Generalized weakness -Suspect related to urinary tract infection /pneumonia in this elderly gentleman. TSH slightly elevated, normal free T4   PT/OT evaluations recommending home health.  Urinary tract infection/ community-acquired pneumonia -Continue Rocephin , urine culture shows Proteus mirabilis sensitive to Rocephin CT scan of the lung shows pneumonia Added azithromycin 500 mg daily for pneumonia Patient has a chronic indwelling Foley, followed by Dr. Risa Grill,  Foley replaced 2 days ago    Infiltrate on chest x-ray Patient states that he recently had a right Pleurx catheter placed for a right pleural effusion Pain across his chest has improved in the last couple of days Small left greater than right layering pleural effusion, no indication for thoracentesis at this time CT scan of the lung shows pneumonia Added azithromycin 500 mg daily for pneumonia   Chronic kidney disease stage IV  -creatinine seems to be at baseline   Parkinson's disease -Continue Sinemet. -Appreciate neurology recommendations. Speech therapy evaluation recommends regular diet with thin liquids  Anemia of chronic disease -Hemoglobin is at his baseline of around 9-1/2-10.    Atrial fibrillation  -rate controlled, anticoagulated on Coumadin, pharmacy to continue to dose. Follow INR  DVT prophylaxis -Anticoagulated on Coumadin.  CODE STATUS -Full code. Discussed with daughter at bedside.    Code Status:      Code Status Orders        Start     Ordered   06/23/15 1733  Full code   Continuous     06/23/15 1732      Family Communication: family updated about patient's clinical progress Disposition Plan:  Anticipate discharge tomorrow   Brief narrative: 81 year old gentleman with history of Parkinson's disease, atrial fibrillation maintained on chronic anticoagulation with Coumadin, diabetes, hypertension, hypothyroidism among other comorbidities, who presents to the hospital today with the above-mentioned. Patient states that over the past 24 hours he has had increased tremors he states that even his tongue has been affected as having a little bit of difficulty swallowing that he says is because his "tongue won't stay still". He called his neurologist recommended he come to the hospital today for evaluation. In the ED he is found to have a urinary tract infection, a small infiltrate on chest x-ray that cannot exclude pneumonia. We are asked to admit him for further evaluation and management.   Consultants:  Neurology  Procedures:  None  Antibiotics: Anti-infectives    Start     Dose/Rate Route Frequency Ordered Stop   06/25/15 0830  azithromycin (ZITHROMAX) tablet 500 mg     500 mg Oral Daily 06/25/15 0822     06/24/15 1600  cefTRIAXone (ROCEPHIN) 1 g in dextrose 5 % 50 mL IVPB - Premix     1 g 100 mL/hr over 30 Minutes Intravenous Every 24 hours 06/23/15 1732     06/23/15 1600  cefTRIAXone (ROCEPHIN) 1 g in dextrose 5 % 50 mL IVPB     1 g 100 mL/hr over 30 Minutes Intravenous  Once 06/23/15 1556 06/23/15 1702         HPI/Subjective: Chest pain is improving, afebrile overnight, no cough  Objective:  Filed Vitals:   06/24/15 1112 06/24/15 1322 06/24/15 2044 06/25/15 0533  BP: 111/70 118/67 103/58 114/65  Pulse: 59 88 50 81  Temp:  98.2 F (36.8 C) 98.1 F (36.7 C) 98 F (36.7 C)  TempSrc:  Oral Oral Oral  Resp:  18 18 20   Height:      Weight:      SpO2:  97% 95% 97%    Intake/Output Summary (Last 24 hours) at 06/25/15 1036 Last data filed at 06/25/15 0900  Gross per 24  hour  Intake   1362 ml  Output   1500 ml  Net   -138 ml    Exam:  General: No acute respiratory distress Lungs: Clear to auscultation bilaterally without wheezes or crackles Cardiovascular: Regular rate and rhythm without murmur gallop or rub normal S1 and S2 Abdomen: Nontender, nondistended, soft, bowel sounds positive, no rebound, no ascites, no appreciable mass Extremities: No significant cyanosis, clubbing, or edema bilateral lower extremities     Data Review   Micro Results Recent Results (from the past 240 hour(s))  Urine culture     Status: None   Collection Time: 06/23/15  2:44 PM  Result Value Ref Range Status   Specimen Description URINE, CATHETERIZED  Final   Special Requests NONE  Final   Culture >=100,000 COLONIES/mL PROTEUS MIRABILIS  Final   Report Status 06/25/2015 FINAL  Final   Organism ID, Bacteria PROTEUS MIRABILIS  Final      Susceptibility   Proteus mirabilis - MIC*    AMPICILLIN <=2 SENSITIVE Sensitive     CEFAZOLIN <=4 SENSITIVE Sensitive     CEFTRIAXONE <=1 SENSITIVE Sensitive     CIPROFLOXACIN 2 INTERMEDIATE Intermediate     GENTAMICIN <=1 SENSITIVE Sensitive     IMIPENEM 1 SENSITIVE Sensitive     NITROFURANTOIN 64 RESISTANT Resistant     TRIMETH/SULFA <=20 SENSITIVE Sensitive     AMPICILLIN/SULBACTAM <=2 SENSITIVE Sensitive     PIP/TAZO <=4 SENSITIVE Sensitive     * >=100,000 COLONIES/mL PROTEUS MIRABILIS    Radiology Reports Dg Chest 2 View  06/23/2015   CLINICAL DATA:  Cough, generalized weakness.  EXAM: CHEST  2 VIEW  COMPARISON:  03/11/2015  FINDINGS: Cardiomegaly with vascular congestion and diffuse interstitial prominence throughout the lungs which could reflect interstitial edema. There are low lung volumes with bibasilar opacities, left greater than right. Suspect small left effusion. No acute bony abnormality.  IMPRESSION: Cardiomegaly, vascular congestion and suspected mild interstitial edema.  Bibasilar opacities, left greater than  right. This likely reflects atelectasis of the right base. Cannot exclude pneumonia in the left base.  Small effusion.   Electronically Signed   By: Rolm Baptise M.D.   On: 06/23/2015 13:09   Ct Head Wo Contrast  06/23/2015   CLINICAL DATA:  Cough, swallowing and speech difficulty, weakness  EXAM: CT HEAD WITHOUT CONTRAST  TECHNIQUE: Contiguous axial images were obtained from the base of the skull through the vertex without intravenous contrast.  COMPARISON:  12/16/2003  FINDINGS: No skull fracture is noted. Paranasal sinuses and mastoid air cells are unremarkable.  No intracranial hemorrhage, mass effect or midline shift. Stable atrophy. Stable periventricular chronic white matter disease. No definite acute cortical infarction. No mass lesion is noted on this unenhanced scan. The gray and white-matter differentiation is preserved.  IMPRESSION: No acute intracranial abnormality. Stable atrophy and chronic white matter disease.   Electronically Signed   By: Lahoma Crocker M.D.   On: 06/23/2015 13:28   Ct  Chest Wo Contrast  06/24/2015   CLINICAL DATA:  81 year old male with right side rib and chest pain. Initial encounter.  EXAM: CT CHEST WITHOUT CONTRAST  TECHNIQUE: Multidetector CT imaging of the chest was performed following the standard protocol without IV contrast.  COMPARISON:  Chest radiographs 06/23/2015 and earlier. Chest CT without contrast 01/14/2015.  FINDINGS: Lower lung volumes. Small to moderate bilateral layering pleural effusions have increased, greater on the left. There is new left lower lobe consolidation with air bronchograms. Central airways remain patent. In the right lower lobe there is patchy and occasionally nodular (series 3, image 36) peri-bronchovascular opacity. The upper lobes demonstrate primarily dependent opacity. This is asymmetrically increased along the left major fissure. The middle lobes are largely spared although there is a new irregular 6 mm nodular density in the right  middle lobe on series 3, image 30.  Mediastinal lymph nodes appear stable. Chronic calcified aortic and coronary artery atherosclerosis. Cardiomegaly appears stable with no pericardial effusion.  No axillary lymphadenopathy.  Stable noncontrast visualized upper abdominal viscera.  No acute osseous abnormality identified. Chronic right posterior lateral eighth and ninth rib fractures are stable. No acute right rib fracture identified.  IMPRESSION: 1. Left lower lobe pneumonia with left greater than right layering pleural effusions. 2. Patchy and nodular opacity also in the right lower lobe, and minimally involving the right middle lobe, suspicious for extension of infection. 3. Stable cardiomegaly, no pericardial effusion.   Electronically Signed   By: Genevie Ann M.D.   On: 06/24/2015 12:29     CBC  Recent Labs Lab 06/23/15 1247 06/24/15 0444 06/25/15 0404  WBC 6.2 6.8 6.6  HGB 9.9* 9.6* 9.5*  HCT 31.9* 31.0* 30.6*  PLT 210 201 191  MCV 89.4 89.1 88.4  MCH 27.7 27.6 27.5  MCHC 31.0 31.0 31.0  RDW 16.7* 16.8* 16.7*  LYMPHSABS 1.3  --   --   MONOABS 0.4  --   --   EOSABS 0.2  --   --   BASOSABS 0.0  --   --     Chemistries   Recent Labs Lab 06/23/15 1247 06/24/15 0444 06/25/15 0404  NA 141 143 142  K 4.4 4.1 4.1  CL 105 106 108  CO2 29 29 27   GLUCOSE 163* 96 103*  BUN 69* 65* 60*  CREATININE 2.65* 2.55* 2.44*  CALCIUM 9.9 9.9 9.6  AST 25  --  18  ALT 7*  --  6*  ALKPHOS 238*  --  204*  BILITOT 0.8  --  0.4   ------------------------------------------------------------------------------------------------------------------ estimated creatinine clearance is 29.2 mL/min (by C-G formula based on Cr of 2.44). ------------------------------------------------------------------------------------------------------------------ No results for input(s): HGBA1C in the last 72  hours. ------------------------------------------------------------------------------------------------------------------ No results for input(s): CHOL, HDL, LDLCALC, TRIG, CHOLHDL, LDLDIRECT in the last 72 hours. ------------------------------------------------------------------------------------------------------------------  Recent Labs  06/23/15 1815  TSH 5.417*   ------------------------------------------------------------------------------------------------------------------  Recent Labs  06/23/15 1815  VITAMINB12 3432*    Coagulation profile  Recent Labs Lab 06/23/15 1247 06/24/15 0444 06/25/15 0404  INR 2.54* 2.51* 2.53*    No results for input(s): DDIMER in the last 72 hours.  Cardiac Enzymes No results for input(s): CKMB, TROPONINI, MYOGLOBIN in the last 168 hours.  Invalid input(s): CK ------------------------------------------------------------------------------------------------------------------ Invalid input(s): POCBNP   CBG:  Recent Labs Lab 06/24/15 0850 06/24/15 1227 06/24/15 1703 06/24/15 2115 06/25/15 0758  GLUCAP 82 99  99 143* 142* 93       Studies: Dg Chest 2 View  06/23/2015  CLINICAL DATA:  Cough, generalized weakness.  EXAM: CHEST  2 VIEW  COMPARISON:  03/11/2015  FINDINGS: Cardiomegaly with vascular congestion and diffuse interstitial prominence throughout the lungs which could reflect interstitial edema. There are low lung volumes with bibasilar opacities, left greater than right. Suspect small left effusion. No acute bony abnormality.  IMPRESSION: Cardiomegaly, vascular congestion and suspected mild interstitial edema.  Bibasilar opacities, left greater than right. This likely reflects atelectasis of the right base. Cannot exclude pneumonia in the left base.  Small effusion.   Electronically Signed   By: Rolm Baptise M.D.   On: 06/23/2015 13:09   Ct Head Wo Contrast  06/23/2015   CLINICAL DATA:  Cough, swallowing and speech  difficulty, weakness  EXAM: CT HEAD WITHOUT CONTRAST  TECHNIQUE: Contiguous axial images were obtained from the base of the skull through the vertex without intravenous contrast.  COMPARISON:  12/16/2003  FINDINGS: No skull fracture is noted. Paranasal sinuses and mastoid air cells are unremarkable.  No intracranial hemorrhage, mass effect or midline shift. Stable atrophy. Stable periventricular chronic white matter disease. No definite acute cortical infarction. No mass lesion is noted on this unenhanced scan. The gray and white-matter differentiation is preserved.  IMPRESSION: No acute intracranial abnormality. Stable atrophy and chronic white matter disease.   Electronically Signed   By: Lahoma Crocker M.D.   On: 06/23/2015 13:28   Ct Chest Wo Contrast  06/24/2015   CLINICAL DATA:  80 year old male with right side rib and chest pain. Initial encounter.  EXAM: CT CHEST WITHOUT CONTRAST  TECHNIQUE: Multidetector CT imaging of the chest was performed following the standard protocol without IV contrast.  COMPARISON:  Chest radiographs 06/23/2015 and earlier. Chest CT without contrast 01/14/2015.  FINDINGS: Lower lung volumes. Small to moderate bilateral layering pleural effusions have increased, greater on the left. There is new left lower lobe consolidation with air bronchograms. Central airways remain patent. In the right lower lobe there is patchy and occasionally nodular (series 3, image 36) peri-bronchovascular opacity. The upper lobes demonstrate primarily dependent opacity. This is asymmetrically increased along the left major fissure. The middle lobes are largely spared although there is a new irregular 6 mm nodular density in the right middle lobe on series 3, image 30.  Mediastinal lymph nodes appear stable. Chronic calcified aortic and coronary artery atherosclerosis. Cardiomegaly appears stable with no pericardial effusion.  No axillary lymphadenopathy.  Stable noncontrast visualized upper abdominal  viscera.  No acute osseous abnormality identified. Chronic right posterior lateral eighth and ninth rib fractures are stable. No acute right rib fracture identified.  IMPRESSION: 1. Left lower lobe pneumonia with left greater than right layering pleural effusions. 2. Patchy and nodular opacity also in the right lower lobe, and minimally involving the right middle lobe, suspicious for extension of infection. 3. Stable cardiomegaly, no pericardial effusion.   Electronically Signed   By: Genevie Ann M.D.   On: 06/24/2015 12:29      Lab Results  Component Value Date   HGBA1C 7.0* 05/20/2015   HGBA1C 6.6* 09/11/2014   HGBA1C 7.2* 06/05/2014   Lab Results  Component Value Date   MICROALBUR 4.8* 06/05/2014   LDLCALC 56 11/12/2013   CREATININE 2.44* 06/25/2015       Scheduled Meds: . azithromycin  500 mg Oral Daily  . carbidopa-levodopa  1.5 tablet Oral TID  . cefTRIAXone (ROCEPHIN)  IV  1 g Intravenous Q24H  . furosemide  20 mg Oral Q M,W,F  . gabapentin  100 mg  Oral BID  . insulin aspart  0-9 Units Subcutaneous TID WC  . insulin aspart  4 Units Subcutaneous TID WC  . insulin glargine  40 Units Subcutaneous QHS  . levothyroxine  100 mcg Oral QAC breakfast  . metoprolol  50 mg Oral q morning - 10a  . polyethylene glycol  17 g Oral QHS  . pravastatin  10 mg Oral q1800  . sertraline  25 mg Oral Daily  . tamsulosin  0.4 mg Oral Daily  . Warfarin - Pharmacist Dosing Inpatient   Does not apply q1800   Continuous Infusions: . dextrose 5 % and 0.9% NaCl 75 mL/hr at 06/25/15 2449    Principal Problem:   UTI (lower urinary tract infection) Active Problems:   Parkinson's disease   CKD (chronic kidney disease) stage 4, GFR 15-29 ml/min   Generalized weakness   Anemia, chronic disease   Pressure ulcer    Time spent: 45 minutes   [facility] Pager [phone]. If 7PM-7AM, please contact night-coverage at [email or web address], password [facility] 06/25/2015, 10:36 AM  LOS: 2 days

## 2015-06-26 LAB — PROTIME-INR
INR: 2.72 — AB (ref 0.00–1.49)
Prothrombin Time: 28.5 seconds — ABNORMAL HIGH (ref 11.6–15.2)

## 2015-06-26 LAB — GLUCOSE, CAPILLARY
GLUCOSE-CAPILLARY: 164 mg/dL — AB (ref 65–99)
Glucose-Capillary: 157 mg/dL — ABNORMAL HIGH (ref 65–99)

## 2015-06-26 LAB — CBC
HEMATOCRIT: 30.5 % — AB (ref 39.0–52.0)
Hemoglobin: 9.8 g/dL — ABNORMAL LOW (ref 13.0–17.0)
MCH: 28.7 pg (ref 26.0–34.0)
MCHC: 32.1 g/dL (ref 30.0–36.0)
MCV: 89.4 fL (ref 78.0–100.0)
PLATELETS: 192 10*3/uL (ref 150–400)
RBC: 3.41 MIL/uL — ABNORMAL LOW (ref 4.22–5.81)
RDW: 16.9 % — AB (ref 11.5–15.5)
WBC: 7.7 10*3/uL (ref 4.0–10.5)

## 2015-06-26 MED ORDER — CEFDINIR 300 MG PO CAPS: 300.0000 mg | ORAL_CAPSULE | Freq: Two times a day (BID) | ORAL | Status: DC

## 2015-06-26 MED ORDER — AZITHROMYCIN 500 MG PO TABS: 500.0000 mg | ORAL_TABLET | Freq: Every day | ORAL | Status: DC

## 2015-06-26 MED ORDER — INSULIN GLARGINE 100 UNIT/ML SOLOSTAR PEN: 50.0000 [IU] | PEN_INJECTOR | Freq: Every day | SUBCUTANEOUS | Status: AC

## 2015-06-26 NOTE — Progress Notes (Signed)
Physical Therapy Treatment Patient Details Name: [name] [name] MRN: [id number] DOB: [birth year] Today's Date: 06/26/2015    History of Present Illness Patient is a very pleasant 81 year old gentleman with history of Parkinson's disease, atrial fibrillation maintained on chronic anticoagulation with Coumadin, diabetes, hypertension, hypothyroidism among other comorbidities, who presents to the hospital today with the above-mentioned. Patient states that over the past 24 hours he has had increased tremors he states that even his tongue has been affected as having a little bit of difficulty swallowing that he says is because his "tongue won't stay still". He called his neurologist recommended he come to the hospital today for evaluation. In the ED he is found to have a urinary tract infection, a small infiltrate on chest x-ray that cannot exclude pneumonia.    PT Comments    Pt making good progress. Ready for return home with wife.  Follow Up Recommendations  Home health PT;Supervision/Assistance - 24 hour     Equipment Recommendations  None recommended by PT    Recommendations for Other Services       Precautions / Restrictions Precautions Precautions: Fall Restrictions Weight Bearing Restrictions: No    Mobility  Bed Mobility Overal bed mobility: Needs Assistance Bed Mobility: Supine to Sit     Supine to sit: Supervision        Transfers Overall transfer level: Needs assistance Equipment used: Rolling walker (2 wheeled) Transfers: Sit to/from Stand Sit to Stand: Min assist         General transfer comment: Assist come up from low surface  Ambulation/Gait Ambulation/Gait assistance: Supervision Ambulation Distance (Feet): 150 Feet Assistive device: Rolling walker (2 wheeled) Gait Pattern/deviations: Step-through pattern;Decreased stride length   Gait velocity interpretation: Below normal speed for age/gender General Gait Details: Assist for safety   Stairs            Wheelchair Mobility    Modified Rankin (Stroke Patients Only)       Balance Overall balance assessment: History of Falls                                  Cognition Arousal/Alertness: Awake/alert Behavior During Therapy: WFL for tasks assessed/performed Overall Cognitive Status: Within Functional Limits for tasks assessed                      Exercises      General Comments        Pertinent Vitals/Pain Pain Assessment: No/denies pain    Home Living                      Prior Function            PT Goals (current goals can now be found in the care plan section) Acute Rehab PT Goals Patient Stated Goal: not stated    Frequency  Min 3X/week    PT Plan Current plan remains appropriate    Co-evaluation             End of Session Equipment Utilized During Treatment: Gait belt Activity Tolerance: Patient tolerated treatment well Patient left: in chair;with call bell/phone within reach;with chair alarm set;with family/visitor present     Time: 1203-1227 PT Time Calculation (min) (ACUTE ONLY): 24 min  Charges:  $Gait Training: 23-37 mins                    G Codes:  [name] [name] 06/26/2015, 4:05 PM  [place]

## 2015-06-26 NOTE — Progress Notes (Signed)
Patient states his wife will be here to pick him up around 1400, he verbalized that if this time changes to let myself know.

## 2015-06-26 NOTE — Progress Notes (Signed)
Patient in bed with eyes closed easy to arouse.  Cyanotic in color sats checked 95%.  No acute distress noted report given

## 2015-06-26 NOTE — Progress Notes (Signed)
ANTICOAGULATION CONSULT NOTE - Follow Up Consult  Pharmacy Consult for Coumadin  Indication: atrial fibrillation  Allergies  Allergen Reactions  . Penicillins Rash    Happened 60 years ago; Tolerates Cephs    Patient Measurements: Height: 6' (182.9 cm) Weight: 222 lb 3.6 oz (100.8 kg) IBW/kg (Calculated) : 77.6  Vital Signs: Temp: 98.4 F (36.9 C) (07/22 0541) Temp Source: Oral (07/22 0541) BP: 104/65 mmHg (07/22 0541) Pulse Rate: 89 (07/22 0541)  Labs:  Recent Labs  06/23/15 1247 06/24/15 0444 06/25/15 0404 06/26/15 0453  HGB 9.9* 9.6* 9.5* 9.8*  HCT 31.9* 31.0* 30.6* 30.5*  PLT 210 201 191 192  LABPROT 27.0* 26.8* 26.9* 28.5*  INR 2.54* 2.51* 2.53* 2.72*  CREATININE 2.65* 2.55* 2.44*  --     Estimated Creatinine Clearance: 29.2 mL/min (by C-G formula based on Cr of 2.44).  Assessment: Pt on warfarin for afib. Restarted on home regimen yesterday (7/21) of 2.5mg  on Sun/Tue/Fri and 5mg  AODs. INR today 2.72   Goal of Therapy:  Monitor platelets by anticoagulation protocol: Yes  INR 2-3    Plan:  Continue on home coumadin regimen with 2.5mg  dose tonight (7/22) Monitor for s/s of bleeding  Monitor CBC and INR   Ashey Tramontana C. Lennox Grumbles, PharmD Pharmacy Resident  Pager: [phone]

## 2015-06-26 NOTE — Discharge Summary (Signed)
Physician Discharge Summary  KEYMON MCELROY MRN: [id number] DOB/AGE: June 16, 1931 81 y.o.  PCP: Nyoka Cowden, MD   Admit date: 06/23/2015 Discharge date: 06/26/2015  Discharge Diagnoses:     Principal Problem:   UTI (lower urinary tract infection) Active Problems:   Parkinson's disease   CKD (chronic kidney disease) stage 4, GFR 15-29 ml/min   Generalized weakness   Anemia, chronic disease   Pressure ulcer    Follow-up recommendations Follow-up with PCP in 3-5 days , including although additional recommended appointments as below Follow-up CBC, CMP in 3-5 days Patient to follow-up with urology , Dr. Risa Grill, to further evaluate his chronic indwelling Foley for blockages, it was changed 2 days ago prior to admission     Medication List    TAKE these medications        acetaminophen 500 MG tablet  Commonly known as:  TYLENOL  Take 500 mg by mouth every 6 (six) hours as needed for mild pain.     azithromycin 500 MG tablet  Commonly known as:  ZITHROMAX  Take 1 tablet (500 mg total) by mouth daily.     carbidopa-levodopa 25-100 MG per tablet  Commonly known as:  SINEMET IR  Take 1 tablet by mouth 3 (three) times daily.     cefdinir 300 MG capsule  Commonly known as:  OMNICEF  Take 1 capsule (300 mg total) by mouth 2 (two) times daily.     diazepam 2 MG tablet  Commonly known as:  VALIUM  Take 2 mg by mouth every 6 (six) hours as needed for anxiety.     furosemide 20 MG tablet  Commonly known as:  LASIX  1 tablet Monday, Wednesday, Friday only     gabapentin 100 MG capsule  Commonly known as:  NEURONTIN  TAKE 2 CAPSULES BY MOUTH EACH EVENING AND ONE CAPSULE AT BEDTIME     Insulin Glargine 100 UNIT/ML Solostar Pen  Commonly known as:  LANTUS SOLOSTAR  Inject 50 Units into the skin daily at 10 pm.     levothyroxine 100 MCG tablet  Commonly known as:  SYNTHROID, LEVOTHROID  TAKE 1 TABLET BY MOUTH ONCE DAILY     lovastatin 20 MG tablet  Commonly  known as:  MEVACOR  TAKE 1 TABLET BY MOUTH DAILY     metoprolol succinate 100 MG 24 hr tablet  Commonly known as:  TOPROL-XL  1 half tablet daily     MIRALAX powder  Generic drug:  polyethylene glycol powder  Take 17 g by mouth at bedtime. For regularity     Polyethyl Glycol-Propyl Glycol 0.4-0.3 % Soln  Apply 1 drop to eye daily as needed (dry eye).     sertraline 25 MG tablet  Commonly known as:  ZOLOFT  Take 1 tablet (25 mg total) by mouth daily.     tamsulosin 0.4 MG Caps capsule  Commonly known as:  FLOMAX  Take 0.4 mg by mouth daily.     traMADol 50 MG tablet  Commonly known as:  ULTRAM  Take 50 mg by mouth every 6 (six) hours as needed for moderate pain or severe pain.     VITAMIN B 12 PO  Take 1 tablet by mouth daily.     warfarin 5 MG tablet  Commonly known as:  COUMADIN  Take 2.5-5 mg by mouth daily at 6 PM. TAKE 2.67m on Sun/Tues/Fri's and Take 5 mg on all other days         Discharge Condition: Stable  Disposition: 01-Home or Self Care   Consults:  None     Significant Diagnostic Studies:  Dg Chest 2 View  06/23/2015   CLINICAL DATA:  Cough, generalized weakness.  EXAM: CHEST  2 VIEW  COMPARISON:  03/11/2015  FINDINGS: Cardiomegaly with vascular congestion and diffuse interstitial prominence throughout the lungs which could reflect interstitial edema. There are low lung volumes with bibasilar opacities, left greater than right. Suspect small left effusion. No acute bony abnormality.  IMPRESSION: Cardiomegaly, vascular congestion and suspected mild interstitial edema.  Bibasilar opacities, left greater than right. This likely reflects atelectasis of the right base. Cannot exclude pneumonia in the left base.  Small effusion.   Electronically Signed   By: Rolm Baptise M.D.   On: 06/23/2015 13:09   Ct Head Wo Contrast  06/23/2015   CLINICAL DATA:  Cough, swallowing and speech difficulty, weakness  EXAM: CT HEAD WITHOUT CONTRAST  TECHNIQUE: Contiguous  axial images were obtained from the base of the skull through the vertex without intravenous contrast.  COMPARISON:  12/16/2003  FINDINGS: No skull fracture is noted. Paranasal sinuses and mastoid air cells are unremarkable.  No intracranial hemorrhage, mass effect or midline shift. Stable atrophy. Stable periventricular chronic white matter disease. No definite acute cortical infarction. No mass lesion is noted on this unenhanced scan. The gray and white-matter differentiation is preserved.  IMPRESSION: No acute intracranial abnormality. Stable atrophy and chronic white matter disease.   Electronically Signed   By: Lahoma Crocker M.D.   On: 06/23/2015 13:28   Ct Chest Wo Contrast  06/24/2015   CLINICAL DATA:  81 year old male with right side rib and chest pain. Initial encounter.  EXAM: CT CHEST WITHOUT CONTRAST  TECHNIQUE: Multidetector CT imaging of the chest was performed following the standard protocol without IV contrast.  COMPARISON:  Chest radiographs 06/23/2015 and earlier. Chest CT without contrast 01/14/2015.  FINDINGS: Lower lung volumes. Small to moderate bilateral layering pleural effusions have increased, greater on the left. There is new left lower lobe consolidation with air bronchograms. Central airways remain patent. In the right lower lobe there is patchy and occasionally nodular (series 3, image 36) peri-bronchovascular opacity. The upper lobes demonstrate primarily dependent opacity. This is asymmetrically increased along the left major fissure. The middle lobes are largely spared although there is a new irregular 6 mm nodular density in the right middle lobe on series 3, image 30.  Mediastinal lymph nodes appear stable. Chronic calcified aortic and coronary artery atherosclerosis. Cardiomegaly appears stable with no pericardial effusion.  No axillary lymphadenopathy.  Stable noncontrast visualized upper abdominal viscera.  No acute osseous abnormality identified. Chronic right posterior lateral  eighth and ninth rib fractures are stable. No acute right rib fracture identified.  IMPRESSION: 1. Left lower lobe pneumonia with left greater than right layering pleural effusions. 2. Patchy and nodular opacity also in the right lower lobe, and minimally involving the right middle lobe, suspicious for extension of infection. 3. Stable cardiomegaly, no pericardial effusion.   Electronically Signed   By: Genevie Ann M.D.   On: 06/24/2015 12:29        Filed Weights   06/23/15 1724  Weight: 100.8 kg (222 lb 3.6 oz)     Microbiology: Recent Results (from the past 240 hour(s))  Urine culture     Status: None   Collection Time: 06/23/15  2:44 PM  Result Value Ref Range Status   Specimen Description URINE, CATHETERIZED  Final   Special Requests NONE  Final  Culture >=100,000 COLONIES/mL PROTEUS MIRABILIS  Final   Report Status 06/25/2015 FINAL  Final   Organism ID, Bacteria PROTEUS MIRABILIS  Final      Susceptibility   Proteus mirabilis - MIC*    AMPICILLIN <=2 SENSITIVE Sensitive     CEFAZOLIN <=4 SENSITIVE Sensitive     CEFTRIAXONE <=1 SENSITIVE Sensitive     CIPROFLOXACIN 2 INTERMEDIATE Intermediate     GENTAMICIN <=1 SENSITIVE Sensitive     IMIPENEM 1 SENSITIVE Sensitive     NITROFURANTOIN 64 RESISTANT Resistant     TRIMETH/SULFA <=20 SENSITIVE Sensitive     AMPICILLIN/SULBACTAM <=2 SENSITIVE Sensitive     PIP/TAZO <=4 SENSITIVE Sensitive     * >=100,000 COLONIES/mL PROTEUS MIRABILIS  Urine culture     Status: None   Collection Time: 06/24/15 10:00 AM  Result Value Ref Range Status   Specimen Description URINE, RANDOM  Final   Special Requests NONE  Final   Culture 3,000 COLONIES/mL INSIGNIFICANT GROWTH  Final   Report Status 06/25/2015 FINAL  Final       Blood Culture    Component Value Date/Time   SDES URINE, RANDOM 06/24/2015 1000   SPECREQUEST NONE 06/24/2015 1000   CULT 3,000 COLONIES/mL INSIGNIFICANT GROWTH 06/24/2015 1000   REPTSTATUS 06/25/2015 FINAL 06/24/2015  1000      Labs: Results for orders placed or performed during the hospital encounter of 06/23/15 (from the past 48 hour(s))  Glucose, capillary     Status: None   Collection Time: 06/24/15 12:27 PM  Result Value Ref Range   Glucose-Capillary 99 65 - 99 mg/dL  Glucose, capillary     Status: None   Collection Time: 06/24/15 12:27 PM  Result Value Ref Range   Glucose-Capillary 99 65 - 99 mg/dL  T4, free     Status: None   Collection Time: 06/24/15  2:32 PM  Result Value Ref Range   Free T4 0.84 0.61 - 1.12 ng/dL  Glucose, capillary     Status: Abnormal   Collection Time: 06/24/15  5:03 PM  Result Value Ref Range   Glucose-Capillary 143 (H) 65 - 99 mg/dL  Glucose, capillary     Status: Abnormal   Collection Time: 06/24/15  9:15 PM  Result Value Ref Range   Glucose-Capillary 142 (H) 65 - 99 mg/dL  Protime-INR     Status: Abnormal   Collection Time: 06/25/15  4:04 AM  Result Value Ref Range   Prothrombin Time 26.9 (H) 11.6 - 15.2 seconds   INR 2.53 (H) 0.00 - 1.49  CBC     Status: Abnormal   Collection Time: 06/25/15  4:04 AM  Result Value Ref Range   WBC 6.6 4.0 - 10.5 K/uL   RBC 3.46 (L) 4.22 - 5.81 MIL/uL   Hemoglobin 9.5 (L) 13.0 - 17.0 g/dL   HCT 30.6 (L) 39.0 - 52.0 %   MCV 88.4 78.0 - 100.0 fL   MCH 27.5 26.0 - 34.0 pg   MCHC 31.0 30.0 - 36.0 g/dL   RDW 16.7 (H) 11.5 - 15.5 %   Platelets 191 150 - 400 K/uL  Comprehensive metabolic panel     Status: Abnormal   Collection Time: 06/25/15  4:04 AM  Result Value Ref Range   Sodium 142 135 - 145 mmol/L   Potassium 4.1 3.5 - 5.1 mmol/L   Chloride 108 101 - 111 mmol/L   CO2 27 22 - 32 mmol/L   Glucose, Bld 103 (H) 65 - 99 mg/dL   BUN  60 (H) 6 - 20 mg/dL   Creatinine, Ser 2.44 (H) 0.61 - 1.24 mg/dL   Calcium 9.6 8.9 - 10.3 mg/dL   Total Protein 6.8 6.5 - 8.1 g/dL   Albumin 2.7 (L) 3.5 - 5.0 g/dL   AST 18 15 - 41 U/L   ALT 6 (L) 17 - 63 U/L   Alkaline Phosphatase 204 (H) 38 - 126 U/L   Total Bilirubin 0.4 0.3 - 1.2  mg/dL   GFR calc non Af Amer 23 (L) >60 mL/min   GFR calc Af Amer 27 (L) >60 mL/min    Comment: (NOTE) The eGFR has been calculated using the CKD EPI equation. This calculation has not been validated in all clinical situations. eGFR's persistently <60 mL/min signify possible Chronic Kidney Disease.    Anion gap 7 5 - 15  Glucose, capillary     Status: None   Collection Time: 06/25/15  7:58 AM  Result Value Ref Range   Glucose-Capillary 93 65 - 99 mg/dL   Comment 1 Notify RN   Glucose, capillary     Status: Abnormal   Collection Time: 06/25/15 11:12 AM  Result Value Ref Range   Glucose-Capillary 132 (H) 65 - 99 mg/dL   Comment 1 Notify RN   Glucose, capillary     Status: Abnormal   Collection Time: 06/25/15  4:16 PM  Result Value Ref Range   Glucose-Capillary 102 (H) 65 - 99 mg/dL   Comment 1 Notify RN   Glucose, capillary     Status: Abnormal   Collection Time: 06/25/15 10:11 PM  Result Value Ref Range   Glucose-Capillary 169 (H) 65 - 99 mg/dL  Protime-INR     Status: Abnormal   Collection Time: 06/26/15  4:53 AM  Result Value Ref Range   Prothrombin Time 28.5 (H) 11.6 - 15.2 seconds   INR 2.72 (H) 0.00 - 1.49  CBC     Status: Abnormal   Collection Time: 06/26/15  4:53 AM  Result Value Ref Range   WBC 7.7 4.0 - 10.5 K/uL   RBC 3.41 (L) 4.22 - 5.81 MIL/uL   Hemoglobin 9.8 (L) 13.0 - 17.0 g/dL   HCT 30.5 (L) 39.0 - 52.0 %   MCV 89.4 78.0 - 100.0 fL   MCH 28.7 26.0 - 34.0 pg   MCHC 32.1 30.0 - 36.0 g/dL   RDW 16.9 (H) 11.5 - 15.5 %   Platelets 192 150 - 400 K/uL  Glucose, capillary     Status: Abnormal   Collection Time: 06/26/15  9:01 AM  Result Value Ref Range   Glucose-Capillary 157 (H) 65 - 99 mg/dL     Lipid Panel     Component Value Date/Time   CHOL 125 11/12/2013 1046   TRIG 181.0* 11/12/2013 1046   HDL 33.00* 11/12/2013 1046   CHOLHDL 4 11/12/2013 1046   VLDL 36.2 11/12/2013 1046   LDLCALC 56 11/12/2013 1046   LDLDIRECT 49.1 12/14/2007 1204            HPI :  81 year old gentleman with history of Parkinson's disease, atrial fibrillation maintained on chronic anticoagulation with Coumadin, diabetes, hypertension, hypothyroidism among other comorbidities, who presents to the hospital today with the above-mentioned. Patient states that over the past 24 hours he has had increased tremors he states that even his tongue has been affected as having a little bit of difficulty swallowing that he says is because his "tongue won't stay still". He called his neurologist recommended he come to the  hospital today for evaluation. In the ED he is found to have a urinary tract infection, a small infiltrate on chest x-ray that cannot exclude pneumonia. We are asked to admit him for further evaluation and management.  HOSPITAL COURSE:  Generalized weakness -Suspect related to urinary tract infection /pneumonia in this elderly gentleman. TSH slightly elevated, normal free T4  PT/OT evaluations recommending home health. Patient declined SNF  Urinary tract infection/ community-acquired pneumonia Initially treated with Rocephin , urine culture shows Proteus mirabilis sensitive to Rocephin, therefore patient switched to [facility] CT scan of the lung shows pneumonia Added azithromycin 500 mg daily for pneumonia, patient to continue with Omnicef and azithromycin for pneumonia and UTI for another 7 days Patient has a chronic indwelling Foley, followed by Dr. Risa Grill, Foley replaced 2 days ago   Infiltrate on chest x-ray Patient states that he recently had a right Pleurx catheter placed for a right pleural effusion Pain across his chest has improved in the last couple of days Small left greater than right layering pleural effusion, no indication for thoracentesis at this time CT scan of the lung shows pneumonia Added azithromycin 500 mg daily for pneumonia   Chronic kidney disease stage IV  -creatinine seems to be at baseline   Parkinson's  disease -Continue Sinemet. -Appreciate neurology recommendations. Speech therapy evaluation recommends regular diet with thin liquids  Anemia of chronic disease -Hemoglobin is at his baseline of around 9-1/2-10.   Atrial fibrillation  -rate controlled, anticoagulated on Coumadin, pharmacy to continue to dose. Follow INR  DVT prophylaxis -Anticoagulated on Coumadin.  CODE STATUS -Full code.         Physical exam Blood pressure 104/65, pulse 89, temperature 98.4 F (36.9 C), temperature source Oral, resp. rate 16, height 6' (1.829 m), weight 100.8 kg (222 lb 3.6 oz), SpO2 94 %.  General: No acute respiratory distress Lungs: Clear to auscultation bilaterally without wheezes or crackles Cardiovascular: Regular rate and rhythm without murmur gallop or rub normal S1 and S2 Abdomen: Nontender, nondistended, soft, bowel sounds positive, no rebound, no ascites, no appreciable mass Extremities: No significant cyanosis, clubbing, or edema bilateral lower extremities       Discharge Instructions    Diet - low sodium heart healthy    Complete by:  As directed      Increase activity slowly    Complete by:  As directed            Follow-up Information    Follow up with Nyoka Cowden, MD. Schedule an appointment as soon as possible for a visit in 3 days.   Specialty:  Internal Medicine   Contact information:   [place] [id number] [phone]       Signed: Reyne Dumas 06/26/2015, 11:45 AM        Time spent >45 mins

## 2015-06-26 NOTE — Progress Notes (Addendum)
NURSING PROGRESS NOTE  [name] [name] [id number] Discharge Data: 06/26/2015 2:58 PM Attending Provider: No att. providers found TXL:[id number] Pilar Plate, MD   [place] to be D/C'd Home with wife via volunteer services with hard copies of prescriptions per MD order with long term foley catheter in place.    All IV's will be discontinued and monitored for bleeding.  All belongings will be returned to patient for patient to take home.  Last Documented Vital Signs:  Blood pressure 92/45, pulse 85, temperature 98.2 F (36.8 C), temperature source Oral, resp. rate 18, height 6' (1.829 m), weight 100.8 kg (222 lb 3.6 oz), SpO2 94 %.  Hendricks Limes RN, BS, BSN

## 2015-06-30 ENCOUNTER — Ambulatory Visit (INDEPENDENT_AMBULATORY_CARE_PROVIDER_SITE_OTHER): Payer: Medicare Other | Admitting: Internal Medicine

## 2015-06-30 ENCOUNTER — Other Ambulatory Visit: Payer: Self-pay | Admitting: *Deleted

## 2015-06-30 ENCOUNTER — Encounter: Payer: Self-pay | Admitting: Internal Medicine

## 2015-06-30 VITALS — BP 120/60 | HR 87 | Temp 98.0°F | Resp 20 | Ht 72.0 in | Wt 222.0 lb

## 2015-06-30 DIAGNOSIS — Z7901 Long term (current) use of anticoagulants: Secondary | ICD-10-CM | POA: Diagnosis not present

## 2015-06-30 DIAGNOSIS — N390 Urinary tract infection, site not specified: Secondary | ICD-10-CM

## 2015-06-30 DIAGNOSIS — J90 Pleural effusion, not elsewhere classified: Secondary | ICD-10-CM | POA: Diagnosis not present

## 2015-06-30 DIAGNOSIS — I4819 Other persistent atrial fibrillation: Secondary | ICD-10-CM

## 2015-06-30 DIAGNOSIS — N184 Chronic kidney disease, stage 4 (severe): Secondary | ICD-10-CM

## 2015-06-30 DIAGNOSIS — R42 Dizziness and giddiness: Secondary | ICD-10-CM | POA: Diagnosis not present

## 2015-06-30 DIAGNOSIS — D638 Anemia in other chronic diseases classified elsewhere: Secondary | ICD-10-CM | POA: Diagnosis not present

## 2015-06-30 DIAGNOSIS — I481 Persistent atrial fibrillation: Secondary | ICD-10-CM

## 2015-06-30 LAB — COMPREHENSIVE METABOLIC PANEL
ALT: 10 U/L (ref 0–53)
AST: 17 U/L (ref 0–37)
Albumin: 3.5 g/dL (ref 3.5–5.2)
Alkaline Phosphatase: 245 U/L — ABNORMAL HIGH (ref 39–117)
BUN: 51 mg/dL — ABNORMAL HIGH (ref 6–23)
CO2: 29 mEq/L (ref 19–32)
Calcium: 10.5 mg/dL (ref 8.4–10.5)
Chloride: 105 mEq/L (ref 96–112)
Creatinine, Ser: 2.39 mg/dL — ABNORMAL HIGH (ref 0.40–1.50)
GFR: 27.82 mL/min — ABNORMAL LOW (ref >60.00)
Glucose, Bld: 167 mg/dL — ABNORMAL HIGH (ref 70–99)
Potassium: 4.5 mEq/L (ref 3.5–5.1)
Sodium: 143 mEq/L (ref 135–145)
Total Bilirubin: 0.5 mg/dL (ref 0.2–1.2)
Total Protein: 7.8 g/dL (ref 6.0–8.3)

## 2015-06-30 LAB — CBC WITH DIFFERENTIAL/PLATELET
BASOS ABS: 0 10*3/uL (ref 0.0–0.1)
Basophils Relative: 0.1 % (ref 0.0–3.0)
EOS PCT: 2.9 % (ref 0.0–5.0)
Eosinophils Absolute: 0.2 10*3/uL (ref 0.0–0.7)
HCT: 32.1 % — ABNORMAL LOW (ref 39.0–52.0)
Hemoglobin: 10.4 g/dL — ABNORMAL LOW (ref 13.0–17.0)
LYMPHS ABS: 1.1 10*3/uL (ref 0.7–4.0)
Lymphocytes Relative: 14 % (ref 12.0–46.0)
MCHC: 32.6 g/dL (ref 30.0–36.0)
MCV: 86.5 fl (ref 78.0–100.0)
Monocytes Absolute: 0.6 10*3/uL (ref 0.1–1.0)
Monocytes Relative: 8.1 % (ref 3.0–12.0)
NEUTROS ABS: 5.8 10*3/uL (ref 1.4–7.7)
Neutrophils Relative %: 74.9 % (ref 43.0–77.0)
PLATELETS: 202 10*3/uL (ref 150.0–400.0)
RBC: 3.71 Mil/uL — AB (ref 4.22–5.81)
RDW: 17.6 % — ABNORMAL HIGH (ref 11.5–15.5)
WBC: 7.7 10*3/uL (ref 4.0–10.5)

## 2015-06-30 MED ORDER — WARFARIN SODIUM 5 MG PO TABS: 2.5000 mg | ORAL_TABLET | Freq: Every day | ORAL | Status: AC

## 2015-06-30 NOTE — Progress Notes (Signed)
Subjective:    Patient ID: [name] [name], male    DOB: 12-30-1930, 81 y.o.   MRN: [id number]  HPI  Admit date: 06/23/2015 Discharge date: 06/26/2015   Principal Problem:  UTI (lower urinary tract infection) Active Problems:  Parkinson's disease  CKD (chronic kidney disease) stage 4, GFR 15-29 ml/min  Generalized weakness  Anemia, chronic disease  Pressure ulcer  Follow-up recommendations Follow-up with PCP in 3-5 days , including although additional recommended appointments as below Follow-up CBC, CMP in 3-5 days Patient to follow-up with urology , Dr. Risa Grill, to further evaluate his chronic indwelling Foley for blockages, it was changed 2 days ago prior to admission  81 year old patient who is seen today following a recent hospital discharge.  He has had a chronic Foley catheter since December of last year.  He is scheduled for urological follow-up soon.  He was discharged on 2 antibiotics for a UTI.  Yesterday he had some nausea and diarrhea which has resolved.  He normally deals with chronic constipation He generally is feeling better.  OT and PT is scheduled to begin later today. He now has a hospital bed due to a sacral decubitus was seen to be improving He requires a refill on Coumadin. He has insulin requiring diabetes and presently is on Lantus 56 units at bedtime.  Past Medical History  Diagnosis Date  . Family history of malignant neoplasm of gastrointestinal tract   . Unspecified constipation   . Abnormal involuntary movements(781.0)   . Vitamin B12 deficiency   . Claudication   . Hypertrophy of prostate with urinary obstruction and other lower urinary tract symptoms (LUTS)   . Obesity   . Mixed hyperlipidemia   . Atrial fibrillation     --myoview 07/2006: not gated. No ischemia or infarct  Diastolic HF-- Echo 04/3298 ER60% mild AI/MR. RV mild to moderately dilated/ dysfunction. ? restrictive CM . RVSP 36  . Bladder polyps   . Kidney stones   .  Unspecified sleep apnea     NPSG 05/14/2007- AHI 80.7/ hr  . OSA (obstructive sleep apnea)     "tried mask; couldn't use it; so I quit" (11/12/2014)  . Hypothyroidism   . History of hiatal hernia   . Daily headache     "I've had headaches all my life; get them almost daily" (11/12/2014)  . Stroke ~ 1985    "simple stroke" denies residual on 11/12/2014  . Arthritis     "just about qwhere"   . Pleural effusion 11/12/2014 admission  . Dysrhythmia     Afib  . Type II or unspecified type diabetes mellitus with neurological manifestations, not stated as uncontrolled   . Shortness of breath dyspnea     walking distance or climbing stairs  . Neuromuscular disorder     Parkinson's / age 69 had growth per left shoulder/neck area that stopped blood flow to left arm was removed and has not no further problems.    History   Social History  . Marital Status: Married    Spouse Name: N/A  . Number of Children: 3  . Years of Education: 16   Occupational History  . entrepenure    Social History Main Topics  . Smoking status: Former Smoker -- 1.00 packs/day for 35 years    Types: Cigarettes    Quit date: 12/05/1985  . Smokeless tobacco: Never Used  . Alcohol Use: No  . Drug Use: No  . Sexual Activity: Not on file   Other Topics  Concern  . Not on file   Social History Narrative   Married '55. 1 son-'64; 3 daughters- '54, '60, '62. 3 grandaughters. 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 Appendectomy    . Kidney stone surgery      Kidney stone retrieval with uretal stent x 2   . Bladder polyps      many times  last time 12/15  . Transurethral resection of bladder    . Orif elbow fracture Left 1976  . Fracture surgery    . Excisional hemorrhoidectomy  1950's  . Ureteral stent placement  X 1  . Cystoscopy w/  stone manipulation  X 2  . Cataract extraction w/ intraocular lens  implant, bilateral Bilateral   . Eye surgery Bilateral     catarct  . Chest tube insertion Right 12/12/2014    Procedure: INSERTION PLEURAL DRAINAGE CATHETER;  Surgeon: Ivin Poot, MD;  Location: [facility];  Service: Thoracic;  Laterality: Right;  . Colonscopy       removal of polyps  . Cystoscopy N/A 01/15/2015    Procedure: CYSTOSCOPY;  Surgeon: Bernestine Amass, MD;  Location: [facility];  Service: Urology;  Laterality: N/A;  . Transurethral resection of bladder tumor N/A 01/15/2015    Procedure: TRANSURETHRAL RESECTION OF BLADDER TUMOR (TURBT);  Surgeon: Bernestine Amass, MD;  Location: [facility];  Service: Urology;  Laterality: N/A;  . Removal of pleural drainage catheter Right 02/23/2015    Procedure: REMOVAL OF PLEURAL DRAINAGE CATHETER;  Surgeon: Ivin Poot, MD;  Location: [facility];  Service: Thoracic;  Laterality: Right;    Family History  Problem Relation Age of Onset  . Lung cancer Mother 65    nonsmoker  . Colon cancer Sister   . Prostate cancer Paternal Uncle   . Stroke Father   . Hip fracture Father   . Healthy Son   . Healthy Daughter     Allergies  Allergen Reactions  . Penicillins Rash    Happened 60 years ago; Tolerates Cephs    Current Outpatient Prescriptions on File Prior to Visit  Medication Sig Dispense Refill  . acetaminophen (TYLENOL) 500 MG tablet Take 500 mg by mouth every 6 (six) hours as needed for mild pain.    . carbidopa-levodopa (SINEMET IR) 25-100 MG per tablet Take 1 tablet by mouth 3 (three) times daily. 270 tablet 3  . Cyanocobalamin (VITAMIN B 12 PO) Take 1 tablet by mouth daily.    . diazepam (VALIUM) 2 MG tablet Take 2 mg by mouth every 6 (six) hours as needed for anxiety.    . furosemide (LASIX) 20 MG tablet 1 tablet Monday, Wednesday, Friday only (Patient taking differently: Take 20 mg by mouth every Monday, Wednesday, and Friday. 1 tablet Monday, Wednesday, Friday only) 90 tablet  3  . gabapentin (NEURONTIN) 100 MG capsule TAKE 2 CAPSULES BY MOUTH EACH EVENING AND ONE CAPSULE AT BEDTIME (Patient taking differently: TAKE 2 CAPSULES BY MOUTH DAILY IN THE MORNING AND ONE CAPSULE AT BEDTIME) 60 capsule 5  . Insulin Glargine (LANTUS SOLOSTAR) 100 UNIT/ML Solostar Pen Inject 50 Units into the skin daily at 10 pm. 15 mL 1  . levothyroxine (SYNTHROID, LEVOTHROID) 100 MCG tablet TAKE 1 TABLET BY MOUTH ONCE DAILY 90 tablet 1  .  lovastatin (MEVACOR) 20 MG tablet TAKE 1 TABLET BY MOUTH DAILY (Patient taking differently: TAKE 1 TABLET BY MOUTH DAILY AT BEDTIME) 30 tablet 5  . metoprolol (TOPROL-XL) 100 MG 24 hr tablet 1 half tablet daily (Patient taking differently: Take 50 mg by mouth every morning. 1 half tablet daily) 90 tablet 3  . Polyethyl Glycol-Propyl Glycol 0.4-0.3 % SOLN Apply 1 drop to eye daily as needed (dry eye).    . polyethylene glycol (MIRALAX) powder Take 17 g by mouth at bedtime. For regularity    . sertraline (ZOLOFT) 25 MG tablet Take 1 tablet (25 mg total) by mouth daily. 30 tablet 11  . Tamsulosin HCl (FLOMAX) 0.4 MG CAPS Take 0.4 mg by mouth daily.     . traMADol (ULTRAM) 50 MG tablet Take 50 mg by mouth every 6 (six) hours as needed for moderate pain or severe pain.     No current facility-administered medications on file prior to visit.    BP 120/60 mmHg  Pulse 87  Temp(Src) 98 F (36.7 C) (Oral)  Resp 20  Ht 6' (1.829 m)  Wt 222 lb (100.699 kg)  BMI 30.10 kg/m2  SpO2 96%       Review of Systems  Constitutional: Positive for activity change, appetite change and fatigue. Negative for fever and chills.  HENT: Negative for congestion, dental problem, ear pain, hearing loss, sore throat, tinnitus, trouble swallowing and voice change.   Eyes: Negative for pain, discharge and visual disturbance.  Respiratory: Negative for cough, chest tightness, wheezing and stridor.   Cardiovascular: Negative for chest pain, palpitations and leg swelling.    Gastrointestinal: Positive for nausea and diarrhea. Negative for vomiting, abdominal pain, constipation, blood in stool and abdominal distention.  Genitourinary: Negative for urgency, hematuria, flank pain, discharge, difficulty urinating and genital sores.  Musculoskeletal: Positive for gait problem. Negative for myalgias, back pain, joint swelling, arthralgias and neck stiffness.  Skin: Positive for wound. Negative for rash.  Neurological: Positive for weakness. Negative for dizziness, syncope, speech difficulty, numbness and headaches.  Hematological: Negative for adenopathy. Does not bruise/bleed easily.  Psychiatric/Behavioral: Negative for behavioral problems and dysphoric mood. The patient is not nervous/anxious.        Objective:   Physical Exam  Constitutional: He is oriented to person, place, and time. He appears well-developed.  Appears weak and chronically ill Afebrile Blood pressure low normal  HENT:  Head: Normocephalic.  Right Ear: External ear normal.  Left Ear: External ear normal.  Dry mucosal membranes  Eyes: Conjunctivae and EOM are normal.  Neck: Normal range of motion.  Cardiovascular: Normal rate and normal heart sounds.   Irregular rhythm with controlled ventricular response  Pulmonary/Chest: Breath sounds normal.  Abdominal: Bowel sounds are normal.  Genitourinary:  Chronic Foley catheter in place  Musculoskeletal: Normal range of motion. He exhibits no edema or tenderness.  Neurological: He is alert and oriented to person, place, and time.  Psychiatric: He has a normal mood and affect. His behavior is normal.          Assessment & Plan:   Chronic atrial fibrillation.  Coumadin refilled Chronic Foley catheterization.  Follow-up urology.  Will complete antibiotic therapy Essential hypertension, stable Parkinson's disease.  Continue in-home PT and OT Chronic kidney disease.  We'll recheck renal indices Diabetes mellitus  Recheck 4 weeks

## 2015-06-30 NOTE — Progress Notes (Signed)
Pre visit review using our clinic review tool, if applicable. No additional management support is needed unless otherwise documented below in the visit note. 

## 2015-06-30 NOTE — Patient Instructions (Signed)
Limit your sodium (Salt) intake  Drink as much fluid as you  can tolerate over the next few days  Return in one month for follow-up

## 2015-07-04 ENCOUNTER — Other Ambulatory Visit: Payer: Self-pay | Admitting: Internal Medicine

## 2015-07-06 ENCOUNTER — Ambulatory Visit: Payer: Medicare Other | Admitting: Internal Medicine

## 2015-07-06 ENCOUNTER — Telehealth: Payer: Self-pay | Admitting: *Deleted

## 2015-07-06 NOTE — Telephone Encounter (Signed)
Late Entry: 10:00 Received call from Physical Therapist Zenia Resides who was at the house with the pt. Pt forgot to take Lantus Insulin at bedtime last night and sugar was 389 this AM, pt did give himself 50 units as directed and 2 hours later sugar is still 379. Discussed with Dr.K and pt needs to come in to be seen. Talked to pt told him to come at 1:00 PM to see Dr.K. Pt verbalized understanding.  11:23 Pt call and said he problem has been resolved so he cancel the 1:15 appointment per [facility].

## 2015-07-06 NOTE — Telephone Encounter (Signed)
Left message on voicemail to call office.  

## 2015-07-07 ENCOUNTER — Ambulatory Visit (INDEPENDENT_AMBULATORY_CARE_PROVIDER_SITE_OTHER): Payer: Medicare Other | Admitting: General Practice

## 2015-07-07 DIAGNOSIS — J189 Pneumonia, unspecified organism: Secondary | ICD-10-CM | POA: Diagnosis not present

## 2015-07-07 DIAGNOSIS — Z5181 Encounter for therapeutic drug level monitoring: Secondary | ICD-10-CM

## 2015-07-07 DIAGNOSIS — I4891 Unspecified atrial fibrillation: Secondary | ICD-10-CM

## 2015-07-07 LAB — POCT INR: INR: 2.6

## 2015-07-07 NOTE — Progress Notes (Signed)
Pre visit review using our clinic review tool, if applicable. No additional management support is needed unless otherwise documented below in the visit note. 

## 2015-07-07 NOTE — Progress Notes (Signed)
I have reviewed and agree with the plan. 

## 2015-07-08 ENCOUNTER — Ambulatory Visit (INDEPENDENT_AMBULATORY_CARE_PROVIDER_SITE_OTHER): Payer: Medicare Other | Admitting: Internal Medicine

## 2015-07-08 ENCOUNTER — Encounter: Payer: Self-pay | Admitting: Internal Medicine

## 2015-07-08 ENCOUNTER — Ambulatory Visit (INDEPENDENT_AMBULATORY_CARE_PROVIDER_SITE_OTHER)
Admission: RE | Admit: 2015-07-08 | Discharge: 2015-07-08 | Disposition: A | Discharge status: Home / Self Care | Payer: Medicare Other | Source: Ambulatory Visit | Attending: Internal Medicine | Admitting: Internal Medicine

## 2015-07-08 VITALS — BP 104/60 | HR 64 | Ht 72.0 in | Wt 217.4 lb

## 2015-07-08 DIAGNOSIS — G4733 Obstructive sleep apnea (adult) (pediatric): Secondary | ICD-10-CM | POA: Diagnosis not present

## 2015-07-08 DIAGNOSIS — I482 Chronic atrial fibrillation, unspecified: Secondary | ICD-10-CM

## 2015-07-08 DIAGNOSIS — J432 Centrilobular emphysema: Secondary | ICD-10-CM | POA: Diagnosis not present

## 2015-07-08 DIAGNOSIS — J90 Pleural effusion, not elsewhere classified: Secondary | ICD-10-CM | POA: Diagnosis not present

## 2015-07-08 DIAGNOSIS — J948 Other specified pleural conditions: Secondary | ICD-10-CM | POA: Diagnosis not present

## 2015-07-08 NOTE — Progress Notes (Signed)
Patient ID: [name] [name], male    DOB: 13-Feb-1931, 81 y.o.   MRN: [id number]  HPI  04/06/11- He was unable to continue CPAP. It starts out fine but when he gets up for bathroom at night, then comes back to bed, the mask always leaks and he is unable to tolerate. He is still having to get up 2-3 x / night for bathroom. He disconnects hose and keeps mask on. He thinks the mask warms  and softens with body heat. Problems center around his need to get up frequently, but he denies any cardiac events since last here.   05/16/11- OSA  Doesn't feel much daytime sleepiness, still takes naps and enjoys them. We discussed reasons for continuing CPAP. Mask either leaks or is too tight at times. [name] makes face itch. Discussed alternatives.  11/15/11- 38 yoM former smoker followed for OSA  Has had flu vaccine. Had a hypoglycemic episode earlier today but says he is back in control today, having eaten. Denies cough or wheeze but notices some exertional dyspnea, little changed. He has given up on CPAP and feels he sleeps well. He still wakes 3 or 4 times a night for nocturia and this was part of the problem with CPAP. He takes an afternoon nap most days.  05/15/12- 17 yoM former smoker followed for OSA   Patient states doing good. OSA-he failed CPAP but is using an oral appliance he got from his dentist, Dr. Archie Balboa. Dry mouth from medications and frequent nocturia him up a lot at night and uses CPAP was too hard to manage. We reviewed his last chest x-ray. There is mild cardiac enlargement. He appears to be some fibrosis or possibly interstitial edema but it does not look like an active progressive condition. He denies cough or change in exercise tolerance. CXR - 02/05/12  IMPRESSION:  Stable chronic lung disease. No acute cardiopulmonary process.  Original Report Authenticated By: Vivia Ewing, M.D.   05/16/13- 42 yoM former smoker followed for OSA/ failed CPAP/ Oral appliance FOLLOWS FOR: states he is  sleeping fine, denies any snoring or breathing issues. Denies any wheezing, cough, or congestion. Has occasional SOB with the simplest jobs-walking, restroom,etc). Biotene helped dry mouth. Little coughing. He did not tolerate CPAP and feels stable and comfortable without it, denying significant snoring or sleepiness. PFT 06/04/2012 mild obstructive airways disease in small airways with insignificant response to bronchodilator, normal lung volumes, diffusion slightly reduced. FVC 3.66/82%, FEV1 2.53/88%, FEV1/FEC 0.69, FEF 25-75% 1.40/59%. RV 113%, TLC 98%, DLCO 72%  05/16/14- 72 yoM former smoker followed for OSA/ failed CPAP/ failed Oral appliance, complicated by AFib, Parkinsons, DM FOLLOWS FOR: Pt states that overall breathing has been doing well since last OV x 1 year ago. No complaints.  Now seeing a neurologist for diagnosis of Parkinson's. Sleep-had an oral appliance. Began making his mouth sore, did not help his sleep and he stopped using it. Breathing-doing well. He denies wheeze or cough. PFT 2013 showed mild obstructive airways disease with insignificant response to bronchodilator. He doesn't feel he has an active problem with this. CXR 05/16/13 IMPRESSION:  No acute findings.  Original Report Authenticated By: Lorin Picket, M.D.  12/03/14- 83 yoM former smoker followed for OSA/ failed CPAP/ failed Oral appliance, complicated by AFib,Chronic R CHF, Recurrent R pleural effusion  Parkinsons, DM, Renal Insufficiency    Wife and daughter here FOLLOWS FOR: pt had thoracentesis X2 this past week in the hospital.  Pt currently having increased sob  with exertion. Hospitalized for dyspnea with large right pleural effusion in mid December. Thoracentesis twice, 3 days apart, for 1 L volume removal of exudative fluid with RBCs, no growth and benign cytology. History of trauma several months ago when he fell, breaking his right ribs and hurting his right shoulder. He sleeps on his back, not in right  decubitus which might favor formation of a right effusion. CT without contrast (creatinine 2.39) did not identify a discrete mass. Each thoracentesis relieved shortness of breath for about 3 or 4 days. His wife thinks he needs it done again. Little cough or phlegm, no fever.  CXR 11/29/14  After T'centesis IMPRESSION: Continued small to moderate right pleural fluid collection with interval improvement. No pneumothorax. Electronically Signed  By: Marin Olp M.D.  On: 11/29/2014 13:06  04/07/15- 50 yoM former smoker followed for Recurrent R pleural effusion, OSA/ failed CPAP/ failed Oral appliance, complicated by AFib,Chronic R CHF,   Parkinsons, DM, Renal Insufficiency    Wife and daughter here FOLLOWS FOR: Has completed visits with Dr Prescott Gum; Pt states his breathing is fine but wife has noticed SOB with walking. Right pleural effusion had developed following a fall with right sided rib fractures. Workup was benign and he was treated by Thoracic Surgery. CXR 03/11/15- reviewed by me. Shallow insp and pleural thickening R base IMPRESSION: No reaccumulation of the right pleural effusion is seen. Probable chronic change remains at the right lung base. Electronically Signed  By: Ivar Drape M.D.  On: 03/11/2015 12:01  07/08/15-84 yoM former smoker followed for Recurrent R pleural effusion, OSA/ failed CPAP/ failed Oral appliance, complicated by AFib,Chronic R CHF, Parkinsons, renal insufficiency FOLLOWS FOR:Pt states his breathing has been bad-gives out too soon. Since last year hospitalized for left lower lobe pneumonia with left pleural effusion. Finished antibiotics. Less cough. Generally weaker and slowly declining. CT chest 06/24/15 IMPRESSION: 1. Left lower lobe pneumonia with left greater than right layering pleural effusions. 2. Patchy and nodular opacity also in the right lower lobe, and minimally involving the right middle lobe, suspicious for extension of infection. 3.  Stable cardiomegaly, no pericardial effusion. Electronically Signed  By: Genevie Ann M.D. CXR- 07/08/15  IMPRESSION: Some improvement in the left lower lobe pneumonia with a small left effusion remaining. Right basilar atelectasis remains. Electronically Signed  By: Ivar Drape M.D.  On: 07/08/2015 10:54  Review of Systems-See HPI Constitutional:   No-   weight loss, night sweats, fevers, chills, fatigue, lassitude. HEENT:   No-  headaches, difficulty swallowing, tooth/dental problems, sore throat,       No-  sneezing, itching, ear ache, nasal congestion, post nasal drip,  CV:  No-   chest pain, orthopnea, PND, swelling in lower extremities, anasarca, dizziness, palpitations Resp: +  shortness of breath with exertion , not at rest.              No-   productive cough,  + non-productive cough,  No- coughing up of blood.              No-   change in color of mucus.  No- wheezing.   Skin: No-   rash or lesions. GI:  No-   heartburn, indigestion, abdominal pain, nausea, vomiting,  GU:  MS:  No-   joint pain or swelling.  Neuro-    + tremor Psych:  No- change in mood or affect. No depression or anxiety.  No memory loss.   Objective:   Physical Exam General- Alert, Oriented, Affect-appropriate  and responsive, Distress- none acute Skin- rash-none, lesions- none, excoriation- none Lymphadenopathy- none Head- atraumatic            Eyes- Gross vision intact, PERRLA, conjunctivae clear secretions            Ears- Hearing, canals-normal            Nose- Clear, no-Septal dev, mucus, polyps, erosion, perforation             Throat- Mallampati II , mucosa very dry , drainage- none, tonsils- atrophic Neck- flexible , trachea midline, no stridor , thyroid nl, carotid no bruit Chest - symmetrical excursion , unlabored           Heart/CV- + IRR , no murmur , no gallop  , no rub, nl s1 s2                           - JVD- none , edema- none, stasis changes- none, varices- none           Lung-  +distant, breath sounds shallow, wheeze- none, cough- none , dullness + both bases, rub- none           Chest wall-  Abd-  Br/ Gen/ Rectal- Not done, not indicated Extrem- cyanosis- none, clubbing, none, atrophy- none, strength- nl, no clubbing. + Cane Neuro- + he holds hands to hide tremor

## 2015-07-08 NOTE — Telephone Encounter (Signed)
Left message on voicemail to call office.  

## 2015-07-08 NOTE — Patient Instructions (Addendum)
Order- print script for incentive spirometer  inhale fully through this three times daily to expand your lungs  Walk as much as you are able

## 2015-07-09 ENCOUNTER — Telehealth: Payer: Self-pay | Admitting: Internal Medicine

## 2015-07-09 NOTE — Telephone Encounter (Signed)
Follow-up urology as scheduled.  Notify if patient develops any fever

## 2015-07-09 NOTE — Telephone Encounter (Signed)
Left a message for Ratazio to return my call.

## 2015-07-09 NOTE — Telephone Encounter (Signed)
Ratazio OT from [facility] is calling to notify dr Raliegh Ip pt urine has blood tinge and dark (brown)in foley drainage bag. Please advise

## 2015-07-13 NOTE — Telephone Encounter (Signed)
Spoke to pt's wife Baker Janus, told her I have been trying to get in touch with pt about his sugars and why he cancelled appointment. Baker Janus said his sugars have been good this morning it was 97, they have come down and she did not realize Dr.K wanted to see him. Told her okay just so sugars are okay, call if become elevated again. Baker Janus verbalized understanding. Dr.K notified.

## 2015-07-13 NOTE — Telephone Encounter (Signed)
Spoke to [place] pt's wife, told her received call from [facility] OT from [facility] about pt's urine blood tinged and dark in foley bag. Baker Janus said yes but has cleared up and it has happened before. Told her Dr.K wants him to Follow up with urology and notify us if develops fever. Baker Janus verbalized understanding and stated pt has an appointment on August 26th. Told her okay call if any problems. Baker Janus verbalized understanding.

## 2015-07-14 ENCOUNTER — Ambulatory Visit (INDEPENDENT_AMBULATORY_CARE_PROVIDER_SITE_OTHER): Payer: Self-pay | Admitting: General Practice

## 2015-07-14 DIAGNOSIS — Z5181 Encounter for therapeutic drug level monitoring: Secondary | ICD-10-CM

## 2015-07-14 DIAGNOSIS — I4891 Unspecified atrial fibrillation: Secondary | ICD-10-CM

## 2015-07-14 LAB — POCT INR: INR: 3.5

## 2015-07-14 NOTE — Progress Notes (Signed)
I have reviewed and agree with the plan. 

## 2015-07-14 NOTE — Progress Notes (Signed)
Pre visit review using our clinic review tool, if applicable. No additional management support is needed unless otherwise documented below in the visit note. 

## 2015-07-15 ENCOUNTER — Ambulatory Visit: Payer: Medicare Other

## 2015-07-16 ENCOUNTER — Telehealth: Payer: Self-pay | Admitting: Internal Medicine

## 2015-07-16 NOTE — Telephone Encounter (Signed)
[name] [name] from [facility] call to ask if they can do  oxygen monitoring doing the night. His stats has been dropping with physical therapy. She said he has not been feeling that great. She said she will go in and check his levels and they will be in the 80's

## 2015-07-16 NOTE — Telephone Encounter (Signed)
Patient has COPD and follows with pulmonology. I would be fine with a verbal order (speaking on behalf of Dr. Raliegh Ip as out of office) and assume pulm would help patient get set up if <88% at night.

## 2015-07-16 NOTE — Telephone Encounter (Signed)
Called and lm on [place] with verbal order.

## 2015-07-16 NOTE — Telephone Encounter (Signed)
See below

## 2015-07-17 ENCOUNTER — Telehealth: Payer: Self-pay | Admitting: Internal Medicine

## 2015-07-17 DIAGNOSIS — J449 Chronic obstructive pulmonary disease, unspecified: Secondary | ICD-10-CM

## 2015-07-17 DIAGNOSIS — I50812 Chronic right heart failure: Secondary | ICD-10-CM

## 2015-07-17 DIAGNOSIS — J9611 Chronic respiratory failure with hypoxia: Secondary | ICD-10-CM

## 2015-07-17 NOTE — Telephone Encounter (Signed)
We need to document oxygen qualifying under Medicare regulations.  Plan- order overnight oximetry on room air for dx COPD with chronic bronchitis, chronic right side heart failure, chronic respiratory failure with hypoxia

## 2015-07-17 NOTE — Telephone Encounter (Signed)
Patients wife notified. Order entered for ONO Nothing further needed.

## 2015-07-17 NOTE — Telephone Encounter (Signed)
Patient's wife came by to pick up Incentive Spirometry.  She is concerned about patient's oxygen levels and feels that he needs to be on oxygen. She says that patients oxygen is dropping below 84% daily.  She wants to know if they can get a prescription for Oxygen.    Dr. Annamaria Boots, Please advise.

## 2015-07-20 ENCOUNTER — Telehealth: Payer: Self-pay | Admitting: Internal Medicine

## 2015-07-20 NOTE — Telephone Encounter (Signed)
[name] called back and left a VM stating that she had checked on the status of pt's ONO machine being delivered.  Per [name] [name], their courier has already gone out today but the pt is on the list and the ONO machine is supposed to be delivered to him tomorrow, on 07/26/2015.  I called & spoke with pt's wife, [name] [name] and advised her of this.  I also gave her the phone number for [facility] and advised if she does not hear from them by tomorrow at approx 2-3 pm to contact them to see what time they are delivering the machine.  Pt's wife voiced understanding.

## 2015-07-20 NOTE — Telephone Encounter (Signed)
ONO was sent to [facility].  I have left a message for Darlina Guys @[facility]  to call me.

## 2015-07-20 NOTE — Telephone Encounter (Signed)
We need to go forward with ONOX as planned. Maybe we can ask the DME to expedite it.

## 2015-07-20 NOTE — Telephone Encounter (Signed)
Spoke with pt's wife, states pt is having increased sob, difficulty sleeping.  Pt's wife is concerned that pt does not have his 02 yet.  I advised that pt has to qualify for 02 before it can be ordered, and the order for the qualifying test was only placed on Friday afternoon; this is a normal turnaround for this test.   Pt does wear a cpap qhs- states this develops air pockets and wakes up pt.   Denies fever.  Pt and wife are requesting further recs from [place]. Will also forward this message to PCC's to check on the status of pt's ONO.    Last ov: 07/08/15 Next ov: 10/22/15  CY please advise.  Thanks!  Allergies  Allergen Reactions  . Penicillins Rash    Happened 60 years ago; Tolerates Cephs   Current Outpatient Prescriptions on File Prior to Visit  Medication Sig Dispense Refill  . acetaminophen (TYLENOL) 500 MG tablet Take 500 mg by mouth every 6 (six) hours as needed for mild pain.    . carbidopa-levodopa (SINEMET IR) 25-100 MG per tablet Take 1 tablet by mouth 3 (three) times daily. 270 tablet 3  . Cyanocobalamin (VITAMIN B 12 PO) Take 1 tablet by mouth daily.    . diazepam (VALIUM) 2 MG tablet Take 2 mg by mouth every 6 (six) hours as needed for anxiety.    . furosemide (LASIX) 20 MG tablet 1 tablet Monday, Wednesday, Friday only (Patient taking differently: Take 20 mg by mouth every Monday, Wednesday, and Friday. 1 tablet Monday, Wednesday, Friday only) 90 tablet 3  . gabapentin (NEURONTIN) 100 MG capsule TAKE 2 CAPSULES BY MOUTH EACH EVENING AND ONE CAPSULE AT BEDTIME (Patient taking differently: TAKE 2 CAPSULES BY MOUTH DAILY IN THE MORNING AND ONE CAPSULE AT BEDTIME) 60 capsule 5  . Insulin Glargine (LANTUS SOLOSTAR) 100 UNIT/ML Solostar Pen Inject 50 Units into the skin daily at 10 pm. (Patient taking differently: Inject 56 Units into the skin daily at 10 pm. ) 15 mL 1  . levothyroxine (SYNTHROID, LEVOTHROID) 100 MCG tablet TAKE 1 TABLET BY MOUTH ONCE DAILY 90 tablet 1  . lovastatin  (MEVACOR) 20 MG tablet TAKE 1 TABLET BY MOUTH DAILY (Patient taking differently: TAKE 1 TABLET BY MOUTH DAILY AT BEDTIME) 30 tablet 5  . metoprolol succinate (TOPROL-XL) 100 MG 24 hr tablet TAKE 1/2 TABLET BY MOUTH DAILY 90 tablet 1  . Polyethyl Glycol-Propyl Glycol 0.4-0.3 % SOLN Apply 1 drop to eye daily as needed (dry eye).    . polyethylene glycol (MIRALAX) powder Take 17 g by mouth at bedtime. For regularity    . sertraline (ZOLOFT) 25 MG tablet Take 1 tablet (25 mg total) by mouth daily. 30 tablet 11  . Tamsulosin HCl (FLOMAX) 0.4 MG CAPS Take 0.4 mg by mouth daily.     . traMADol (ULTRAM) 50 MG tablet Take 50 mg by mouth every 6 (six) hours as needed for moderate pain or severe pain.    . trospium (SANCTURA) 20 MG tablet Take 20 mg by mouth daily.    [name] warfarin (COUMADIN) 5 MG tablet Take 0.5-1 tablets (2.5-5 mg total) by mouth daily at 6 PM. TAKE 2.5mg  on Sun/Tues/Fri's and Take 5 mg on all other days 90 tablet 2   No current facility-administered medications on file prior to visit.

## 2015-07-20 NOTE — Telephone Encounter (Signed)
Called made spouse aware of below. I can't tell from order where ONO was sent so will forward to PCC's/

## 2015-07-21 ENCOUNTER — Telehealth: Payer: Self-pay | Admitting: Internal Medicine

## 2015-07-21 ENCOUNTER — Encounter (HOSPITAL_COMMUNITY): Payer: Self-pay | Admitting: Emergency Medicine

## 2015-07-21 ENCOUNTER — Inpatient Hospital Stay (HOSPITAL_COMMUNITY)
Admission: EM | Admit: 2015-07-21 | Discharge: 2015-08-06 | DRG: 291 | Disposition: E | Payer: Medicare Other | Attending: Internal Medicine | Admitting: Internal Medicine

## 2015-07-21 ENCOUNTER — Emergency Department (HOSPITAL_COMMUNITY): Payer: Medicare Other

## 2015-07-21 DIAGNOSIS — G20 Parkinson's disease: Secondary | ICD-10-CM | POA: Diagnosis present

## 2015-07-21 DIAGNOSIS — J9621 Acute and chronic respiratory failure with hypoxia: Secondary | ICD-10-CM | POA: Diagnosis present

## 2015-07-21 DIAGNOSIS — E1142 Type 2 diabetes mellitus with diabetic polyneuropathy: Secondary | ICD-10-CM

## 2015-07-21 DIAGNOSIS — E440 Moderate protein-calorie malnutrition: Secondary | ICD-10-CM | POA: Diagnosis present

## 2015-07-21 DIAGNOSIS — I5023 Acute on chronic systolic (congestive) heart failure: Principal | ICD-10-CM | POA: Diagnosis present

## 2015-07-21 DIAGNOSIS — E1122 Type 2 diabetes mellitus with diabetic chronic kidney disease: Secondary | ICD-10-CM | POA: Diagnosis present

## 2015-07-21 DIAGNOSIS — E038 Other specified hypothyroidism: Secondary | ICD-10-CM | POA: Diagnosis present

## 2015-07-21 DIAGNOSIS — I272 Other secondary pulmonary hypertension: Secondary | ICD-10-CM | POA: Diagnosis present

## 2015-07-21 DIAGNOSIS — Z9889 Other specified postprocedural states: Secondary | ICD-10-CM

## 2015-07-21 DIAGNOSIS — Z515 Encounter for palliative care: Secondary | ICD-10-CM

## 2015-07-21 DIAGNOSIS — I480 Paroxysmal atrial fibrillation: Secondary | ICD-10-CM | POA: Diagnosis not present

## 2015-07-21 DIAGNOSIS — J449 Chronic obstructive pulmonary disease, unspecified: Secondary | ICD-10-CM | POA: Diagnosis present

## 2015-07-21 DIAGNOSIS — Z87891 Personal history of nicotine dependence: Secondary | ICD-10-CM | POA: Diagnosis not present

## 2015-07-21 DIAGNOSIS — D649 Anemia, unspecified: Secondary | ICD-10-CM | POA: Diagnosis present

## 2015-07-21 DIAGNOSIS — E1140 Type 2 diabetes mellitus with diabetic neuropathy, unspecified: Secondary | ICD-10-CM | POA: Diagnosis present

## 2015-07-21 DIAGNOSIS — Z794 Long term (current) use of insulin: Secondary | ICD-10-CM | POA: Diagnosis not present

## 2015-07-21 DIAGNOSIS — Z7901 Long term (current) use of anticoagulants: Secondary | ICD-10-CM | POA: Diagnosis not present

## 2015-07-21 DIAGNOSIS — J9601 Acute respiratory failure with hypoxia: Secondary | ICD-10-CM | POA: Diagnosis present

## 2015-07-21 DIAGNOSIS — J942 Hemothorax: Secondary | ICD-10-CM | POA: Diagnosis not present

## 2015-07-21 DIAGNOSIS — E1149 Type 2 diabetes mellitus with other diabetic neurological complication: Secondary | ICD-10-CM | POA: Diagnosis present

## 2015-07-21 DIAGNOSIS — E538 Deficiency of other specified B group vitamins: Secondary | ICD-10-CM | POA: Diagnosis present

## 2015-07-21 DIAGNOSIS — E118 Type 2 diabetes mellitus with unspecified complications: Secondary | ICD-10-CM | POA: Diagnosis not present

## 2015-07-21 DIAGNOSIS — E039 Hypothyroidism, unspecified: Secondary | ICD-10-CM | POA: Diagnosis present

## 2015-07-21 DIAGNOSIS — G4733 Obstructive sleep apnea (adult) (pediatric): Secondary | ICD-10-CM | POA: Diagnosis present

## 2015-07-21 DIAGNOSIS — E119 Type 2 diabetes mellitus without complications: Secondary | ICD-10-CM

## 2015-07-21 DIAGNOSIS — R001 Bradycardia, unspecified: Secondary | ICD-10-CM | POA: Diagnosis not present

## 2015-07-21 DIAGNOSIS — Z8673 Personal history of transient ischemic attack (TIA), and cerebral infarction without residual deficits: Secondary | ICD-10-CM | POA: Diagnosis not present

## 2015-07-21 DIAGNOSIS — N184 Chronic kidney disease, stage 4 (severe): Secondary | ICD-10-CM | POA: Diagnosis present

## 2015-07-21 DIAGNOSIS — R1310 Dysphagia, unspecified: Secondary | ICD-10-CM | POA: Diagnosis present

## 2015-07-21 DIAGNOSIS — I50810 Right heart failure, unspecified: Secondary | ICD-10-CM | POA: Diagnosis present

## 2015-07-21 DIAGNOSIS — E782 Mixed hyperlipidemia: Secondary | ICD-10-CM | POA: Diagnosis present

## 2015-07-21 DIAGNOSIS — J90 Pleural effusion, not elsewhere classified: Secondary | ICD-10-CM | POA: Diagnosis present

## 2015-07-21 DIAGNOSIS — J189 Pneumonia, unspecified organism: Secondary | ICD-10-CM | POA: Diagnosis present

## 2015-07-21 DIAGNOSIS — N3000 Acute cystitis without hematuria: Secondary | ICD-10-CM | POA: Diagnosis present

## 2015-07-21 DIAGNOSIS — I739 Peripheral vascular disease, unspecified: Secondary | ICD-10-CM | POA: Diagnosis present

## 2015-07-21 DIAGNOSIS — Z6828 Body mass index (BMI) 28.0-28.9, adult: Secondary | ICD-10-CM | POA: Diagnosis not present

## 2015-07-21 DIAGNOSIS — J948 Other specified pleural conditions: Secondary | ICD-10-CM | POA: Diagnosis not present

## 2015-07-21 DIAGNOSIS — Z66 Do not resuscitate: Secondary | ICD-10-CM | POA: Diagnosis present

## 2015-07-21 DIAGNOSIS — I4891 Unspecified atrial fibrillation: Secondary | ICD-10-CM | POA: Diagnosis present

## 2015-07-21 DIAGNOSIS — Z7189 Other specified counseling: Secondary | ICD-10-CM | POA: Diagnosis present

## 2015-07-21 DIAGNOSIS — R0602 Shortness of breath: Secondary | ICD-10-CM

## 2015-07-21 DIAGNOSIS — R627 Adult failure to thrive: Secondary | ICD-10-CM | POA: Diagnosis present

## 2015-07-21 DIAGNOSIS — I509 Heart failure, unspecified: Secondary | ICD-10-CM | POA: Diagnosis not present

## 2015-07-21 DIAGNOSIS — I482 Chronic atrial fibrillation: Secondary | ICD-10-CM | POA: Diagnosis present

## 2015-07-21 DIAGNOSIS — R531 Weakness: Secondary | ICD-10-CM | POA: Diagnosis not present

## 2015-07-21 DIAGNOSIS — N390 Urinary tract infection, site not specified: Secondary | ICD-10-CM | POA: Diagnosis present

## 2015-07-21 LAB — CBC WITH DIFFERENTIAL/PLATELET
Basophils Absolute: 0 10*3/uL (ref 0.0–0.1)
Basophils Relative: 0 % (ref 0–1)
Eosinophils Absolute: 0.2 10*3/uL (ref 0.0–0.7)
Eosinophils Relative: 3 % (ref 0–5)
HCT: 31.4 % — ABNORMAL LOW (ref 39.0–52.0)
Hemoglobin: 9.3 g/dL — ABNORMAL LOW (ref 13.0–17.0)
Lymphocytes Relative: 18 % (ref 12–46)
Lymphs Abs: 1.4 10*3/uL (ref 0.7–4.0)
MCH: 26.5 pg (ref 26.0–34.0)
MCHC: 29.6 g/dL — ABNORMAL LOW (ref 30.0–36.0)
MCV: 89.5 fL (ref 78.0–100.0)
Monocytes Absolute: 0.6 10*3/uL (ref 0.1–1.0)
Monocytes Relative: 8 % (ref 3–12)
Neutro Abs: 5.7 10*3/uL (ref 1.7–7.7)
Neutrophils Relative %: 71 % (ref 43–77)
Platelets: 243 10*3/uL (ref 150–400)
RBC: 3.51 MIL/uL — ABNORMAL LOW (ref 4.22–5.81)
RDW: 16.3 % — ABNORMAL HIGH (ref 11.5–15.5)
WBC: 7.9 10*3/uL (ref 4.0–10.5)

## 2015-07-21 LAB — BLOOD GAS, ARTERIAL
Acid-Base Excess: 3.4 mmol/L — ABNORMAL HIGH (ref 0.0–2.0)
Bicarbonate: 29.5 mEq/L — ABNORMAL HIGH (ref 20.0–24.0)
DRAWN BY: 422461
O2 CONTENT: 3 L/min
O2 Saturation: 94 %
PH ART: 7.344 — AB (ref 7.350–7.450)
Patient temperature: 98.2
TCO2: 27.8 mmol/L (ref 0–100)
pCO2 arterial: 55.4 mmHg — ABNORMAL HIGH (ref 35.0–45.0)
pO2, Arterial: 74 mmHg — ABNORMAL LOW (ref 80.0–100.0)

## 2015-07-21 LAB — PROTIME-INR
INR: 3.19 — ABNORMAL HIGH (ref 0.00–1.49)
Prothrombin Time: 32.1 seconds — ABNORMAL HIGH (ref 11.6–15.2)

## 2015-07-21 LAB — URINE MICROSCOPIC-ADD ON

## 2015-07-21 LAB — URINALYSIS, ROUTINE W REFLEX MICROSCOPIC
Bilirubin Urine: NEGATIVE
Glucose, UA: NEGATIVE mg/dL
Ketones, ur: NEGATIVE mg/dL
Nitrite: POSITIVE — AB
Protein, ur: 30 mg/dL — AB
Specific Gravity, Urine: 1.016 (ref 1.005–1.030)
Urobilinogen, UA: 0.2 mg/dL (ref 0.0–1.0)
pH: 5 (ref 5.0–8.0)

## 2015-07-21 LAB — COMPREHENSIVE METABOLIC PANEL
ALT: <5 U/L — ABNORMAL LOW (ref 17–63)
AST: 16 U/L (ref 15–41)
Albumin: 2.9 g/dL — ABNORMAL LOW (ref 3.5–5.0)
Alkaline Phosphatase: 230 U/L — ABNORMAL HIGH (ref 38–126)
Anion gap: 7 (ref 5–15)
BUN: 83 mg/dL — ABNORMAL HIGH (ref 6–20)
CO2: 30 mmol/L (ref 22–32)
Calcium: 10.5 mg/dL — ABNORMAL HIGH (ref 8.9–10.3)
Chloride: 104 mmol/L (ref 101–111)
Creatinine, Ser: 2.75 mg/dL — ABNORMAL HIGH (ref 0.61–1.24)
GFR calc Af Amer: 23 mL/min — ABNORMAL LOW (ref >60)
GFR calc non Af Amer: 20 mL/min — ABNORMAL LOW (ref >60)
Glucose, Bld: 134 mg/dL — ABNORMAL HIGH (ref 65–99)
Potassium: 4.7 mmol/L (ref 3.5–5.1)
Sodium: 141 mmol/L (ref 135–145)
Total Bilirubin: 0.4 mg/dL (ref 0.3–1.2)
Total Protein: 7.4 g/dL (ref 6.5–8.1)

## 2015-07-21 LAB — D-DIMER, QUANTITATIVE (NOT AT ARMC): D-Dimer, Quant: 0.84 ug/mL-FEU — ABNORMAL HIGH (ref 0.00–0.48)

## 2015-07-21 LAB — BRAIN NATRIURETIC PEPTIDE: B Natriuretic Peptide: 54.4 pg/mL (ref 0.0–100.0)

## 2015-07-21 LAB — I-STAT CG4 LACTIC ACID, ED: Lactic Acid, Venous: 0.56 mmol/L (ref 0.5–2.0)

## 2015-07-21 LAB — CBG MONITORING, ED: Glucose-Capillary: 147 mg/dL — ABNORMAL HIGH (ref 65–99)

## 2015-07-21 LAB — TROPONIN I: Troponin I: <0.03 ng/mL (ref <0.031)

## 2015-07-21 LAB — PROCALCITONIN: PROCALCITONIN: 0.16 ng/mL

## 2015-07-21 MED ORDER — METOPROLOL SUCCINATE ER 50 MG PO TB24
50.0000 mg | ORAL_TABLET | Freq: Every day | ORAL | Status: DC
  Administered 2015-07-22 – 2015-07-25 (×3): 50 mg via ORAL
  Filled 2015-07-21 (×2): qty 1
  Filled 2015-07-21: qty 2
  Filled 2015-07-21: qty 1

## 2015-07-21 MED ORDER — DEXTROSE 5 % IV SOLN: 1.0000 g | Freq: Once | INTRAVENOUS | Status: DC

## 2015-07-21 MED ORDER — DEXTROSE 5 % IV SOLN
1.0000 g | Freq: Three times a day (TID) | INTRAVENOUS | Status: DC
  Administered 2015-07-22 – 2015-07-28 (×20): 1 g via INTRAVENOUS
  Filled 2015-07-21 (×25): qty 1

## 2015-07-21 MED ORDER — TAMSULOSIN HCL 0.4 MG PO CAPS
0.4000 mg | ORAL_CAPSULE | Freq: Every day | ORAL | Status: DC
  Administered 2015-07-21 – 2015-07-25 (×5): 0.4 mg via ORAL
  Filled 2015-07-21 (×5): qty 1

## 2015-07-21 MED ORDER — VANCOMYCIN HCL IN DEXTROSE 1-5 GM/200ML-% IV SOLN
1000.0000 mg | Freq: Once | INTRAVENOUS | Status: AC
  Administered 2015-07-21: 1000 mg via INTRAVENOUS
  Filled 2015-07-21: qty 200

## 2015-07-21 MED ORDER — INSULIN ASPART 100 UNIT/ML ~~LOC~~ SOLN
0.0000 [IU] | SUBCUTANEOUS | Status: DC
  Administered 2015-07-22: 1 [IU] via SUBCUTANEOUS
  Administered 2015-07-23: 2 [IU] via SUBCUTANEOUS

## 2015-07-21 MED ORDER — VANCOMYCIN HCL IN DEXTROSE 1-5 GM/200ML-% IV SOLN
1000.0000 mg | INTRAVENOUS | Status: DC
  Administered 2015-07-22 – 2015-07-24 (×3): 1000 mg via INTRAVENOUS
  Filled 2015-07-21 (×4): qty 200

## 2015-07-21 MED ORDER — TRAMADOL HCL 50 MG PO TABS
50.0000 mg | ORAL_TABLET | Freq: Four times a day (QID) | ORAL | Status: DC | PRN
  Administered 2015-07-22 – 2015-07-25 (×2): 50 mg via ORAL
  Filled 2015-07-21 (×2): qty 1

## 2015-07-21 MED ORDER — PRAVASTATIN SODIUM 20 MG PO TABS
20.0000 mg | ORAL_TABLET | Freq: Every day | ORAL | Status: DC
  Administered 2015-07-22 – 2015-07-25 (×4): 20 mg via ORAL
  Filled 2015-07-21 (×6): qty 1

## 2015-07-21 MED ORDER — LEVOTHYROXINE SODIUM 100 MCG PO TABS
100.0000 ug | ORAL_TABLET | Freq: Every day | ORAL | Status: DC
  Administered 2015-07-22 – 2015-07-25 (×4): 100 ug via ORAL
  Filled 2015-07-21 (×4): qty 1
  Filled 2015-07-21: qty 2
  Filled 2015-07-21 (×3): qty 1

## 2015-07-21 MED ORDER — GABAPENTIN 100 MG PO CAPS
200.0000 mg | ORAL_CAPSULE | Freq: Every day | ORAL | Status: DC
  Administered 2015-07-22 – 2015-07-25 (×5): 200 mg via ORAL
  Filled 2015-07-21 (×7): qty 2

## 2015-07-21 MED ORDER — GABAPENTIN 100 MG PO CAPS
100.0000 mg | ORAL_CAPSULE | Freq: Every day | ORAL | Status: DC
  Administered 2015-07-22 – 2015-07-25 (×4): 100 mg via ORAL
  Filled 2015-07-21 (×5): qty 1

## 2015-07-21 MED ORDER — SERTRALINE HCL 25 MG PO TABS
25.0000 mg | ORAL_TABLET | Freq: Every day | ORAL | Status: DC
  Administered 2015-07-22 – 2015-07-25 (×5): 25 mg via ORAL
  Filled 2015-07-21 (×6): qty 1

## 2015-07-21 MED ORDER — DEXTROSE 5 % IV SOLN
2.0000 g | Freq: Once | INTRAVENOUS | Status: AC
  Administered 2015-07-21: 2 g via INTRAVENOUS
  Filled 2015-07-21: qty 2

## 2015-07-21 MED ORDER — INSULIN GLARGINE 100 UNIT/ML ~~LOC~~ SOLN
25.0000 [IU] | Freq: Every day | SUBCUTANEOUS | Status: DC
  Administered 2015-07-22 – 2015-07-24 (×4): 25 [IU] via SUBCUTANEOUS
  Filled 2015-07-21 (×5): qty 0.25

## 2015-07-21 MED ORDER — DIAZEPAM 2 MG PO TABS
2.0000 mg | ORAL_TABLET | Freq: Four times a day (QID) | ORAL | Status: DC | PRN
  Administered 2015-07-21 – 2015-07-25 (×3): 2 mg via ORAL
  Filled 2015-07-21 (×3): qty 1

## 2015-07-21 MED ORDER — DARIFENACIN HYDROBROMIDE ER 7.5 MG PO TB24
7.5000 mg | ORAL_TABLET | Freq: Every day | ORAL | Status: DC
  Administered 2015-07-22 – 2015-07-25 (×4): 7.5 mg via ORAL
  Filled 2015-07-21 (×5): qty 1

## 2015-07-21 MED ORDER — CARBIDOPA-LEVODOPA 25-100 MG PO TABS
1.0000 | ORAL_TABLET | Freq: Three times a day (TID) | ORAL | Status: DC
  Administered 2015-07-22 – 2015-07-28 (×16): 1 via ORAL
  Filled 2015-07-21 (×26): qty 1

## 2015-07-21 NOTE — H&P (Signed)
[facility] History and Physical  Patient: [name] [name]  MRN: [id number]  DOB: Oct 19, 1931  DOS: the patient was seen and examined on 08/01/2015 PCP: Nyoka Cowden, MD  Referring physician: Dr Laneta Simmers Chief Complaint: Shortness of breath and fatigue.   HPI: [name] [name] is a 81 y.o. male with Past medical history of pulmonary hypertension with right-sided heart failure, B-12 deficiency, BPH, atrial fibrillation on anticoagulation, hypothyroidism, Parkinson's disease. The patient presents with complaints of progressively worsening fatigue as well as shortness of breath and confusion. Patient was recently hospitalized for a UTI and was also found to be having left-sided pneumonia. He was treated with Ceftdnir.[name] He was following up with his PCP as well as pulmonology on a regular basis. Due to his increasing worsening shortness of breath he was recommended to be placed on oxygen after verifying with pulse oximetry nocturnally. Patient mentions that his breathing was progressively worsening and he was getting tired and more fatigue and was laying in the bed more frequently and therefore he decided to come to the hospital since he was unable to get oxygen at home. No fever no chills no chest pain reported. No nausea no vomiting no choking episodes. No diarrhea no constipation. No active bleeding. No recent change in his medications as well.  The patient is coming from home.  At his baseline ambulates with a walker  And is independent for most of his ADL manages her medication on his own.  Review of Systems: as mentioned in the history of present illness.  A comprehensive review of the other systems is negative.  Past Medical History  Diagnosis Date  . Family history of malignant neoplasm of gastrointestinal tract   . Unspecified constipation   . Abnormal involuntary movements(781.0)   . Vitamin B12 deficiency   . Claudication   . Hypertrophy of prostate with urinary  obstruction and other lower urinary tract symptoms (LUTS)   . Obesity   . Mixed hyperlipidemia   . Atrial fibrillation     --myoview 07/2006: not gated. No ischemia or infarct  Diastolic HF-- Echo 06/7823 ER60% mild AI/MR. RV mild to moderately dilated/ dysfunction. ? restrictive CM . RVSP 36  . Bladder polyps   . Kidney stones   . Unspecified sleep apnea     NPSG 05/14/2007- AHI 80.7/ hr  . OSA (obstructive sleep apnea)     "tried mask; couldn't use it; so I quit" (11/12/2014)  . Hypothyroidism   . History of hiatal hernia   . Daily headache     "I've had headaches all my life; get them almost daily" (11/12/2014)  . Stroke ~ 1985    "simple stroke" denies residual on 11/12/2014  . Arthritis     "just about qwhere"   . Pleural effusion 11/12/2014 admission  . Dysrhythmia     Afib  . Type II or unspecified type diabetes mellitus with neurological manifestations, not stated as uncontrolled   . Shortness of breath dyspnea     walking distance or climbing stairs  . Neuromuscular disorder     Parkinson's / age 24 had growth per left shoulder/neck area that stopped blood flow to left arm was removed and has not no further problems.   Past Surgical History  Procedure Laterality Date  . Appendectomy    . Kidney stone surgery      Kidney stone retrieval with uretal stent x 2   . Bladder polyps      many times  last time 12/15  .  Transurethral resection of bladder    . Orif elbow fracture Left 1976  . Fracture surgery    . Excisional hemorrhoidectomy  1950's  . Ureteral stent placement  X 1  . Cystoscopy w/ stone manipulation  X 2  . Cataract extraction w/ intraocular lens  implant, bilateral Bilateral   . Eye surgery Bilateral     catarct  . Chest tube insertion Right 12/12/2014    Procedure: INSERTION PLEURAL DRAINAGE CATHETER;  Surgeon: Ivin Poot, MD;  Location: [place];  Service: Thoracic;  Laterality: Right;  . Colonscopy       removal of polyps  . Cystoscopy N/A 01/15/2015     Procedure: CYSTOSCOPY;  Surgeon: Bernestine Amass, MD;  Location: [facility];  Service: Urology;  Laterality: N/A;  . Transurethral resection of bladder tumor N/A 01/15/2015    Procedure: TRANSURETHRAL RESECTION OF BLADDER TUMOR (TURBT);  Surgeon: Bernestine Amass, MD;  Location: [facility];  Service: Urology;  Laterality: N/A;  . Removal of pleural drainage catheter Right 02/23/2015    Procedure: REMOVAL OF PLEURAL DRAINAGE CATHETER;  Surgeon: Ivin Poot, MD;  Location: [place];  Service: Thoracic;  Laterality: Right;   Social History:  reports that he quit smoking about 29 years ago. His smoking use included Cigarettes. He has a 35 pack-year smoking history. He has never used smokeless tobacco. He reports that he does not drink alcohol or use illicit drugs.  Allergies  Allergen Reactions  . Penicillins Rash    Happened 60 years ago; Tolerates Cephs    Family History  Problem Relation Age of Onset  . Lung cancer Mother 36    nonsmoker  . Colon cancer Sister   . Prostate cancer Paternal Uncle   . Stroke Father   . Hip fracture Father   . Healthy Son   . Healthy Daughter     Prior to Admission medications   Medication Sig Start Date End Date Taking? Authorizing Provider  acetaminophen (TYLENOL) 500 MG tablet Take 500 mg by mouth every 6 (six) hours as needed for mild pain.   Yes Historical Provider, MD  carbidopa-levodopa (SINEMET IR) 25-100 MG per tablet Take 1 tablet by mouth 3 (three) times daily. 01/07/15  Yes Donika K Karver Fadden, DO  Cyanocobalamin (VITAMIN B 12 PO) Take 1 tablet by mouth daily.   Yes Historical Provider, MD  diazepam (VALIUM) 2 MG tablet Take 2 mg by mouth every 6 (six) hours as needed for anxiety.   Yes Historical Provider, MD  furosemide (LASIX) 20 MG tablet 1 tablet Monday, Wednesday, Friday only Patient taking differently: Take 20 mg by mouth every Monday, Wednesday, and Friday. 1 tablet Monday, Wednesday, Friday only 06/24/14  Yes Marletta Lor, MD  gabapentin  (NEURONTIN) 100 MG capsule TAKE 2 CAPSULES BY MOUTH EACH EVENING AND ONE CAPSULE AT BEDTIME Patient taking differently: TAKE 2 CAPSULES BY MOUTH at night and  ONE CAPSULE AT noon 06/17/15  Yes Marletta Lor, MD  Insulin Glargine (LANTUS SOLOSTAR) 100 UNIT/ML Solostar Pen Inject 50 Units into the skin daily at 10 pm. 06/26/15  Yes Reyne Dumas, MD  levothyroxine (SYNTHROID, LEVOTHROID) 100 MCG tablet TAKE 1 TABLET BY MOUTH ONCE DAILY 05/27/15  Yes Marletta Lor, MD  lovastatin (MEVACOR) 20 MG tablet TAKE 1 TABLET BY MOUTH DAILY 04/24/15  Yes Marletta Lor, MD  metoprolol succinate (TOPROL-XL) 100 MG 24 hr tablet TAKE 1/2 TABLET BY MOUTH DAILY 07/04/15  Yes Marletta Lor, MD  Polyethyl  Glycol-Propyl Glycol 0.4-0.3 % SOLN Apply 1 drop to eye daily as needed (dry eye).   Yes Historical Provider, MD  polyethylene glycol (MIRALAX) powder Take 17 g by mouth at bedtime. For regularity   Yes Historical Provider, MD  sertraline (ZOLOFT) 25 MG tablet Take 1 tablet (25 mg total) by mouth daily. 12/30/14  Yes Marletta Lor, MD  Tamsulosin HCl (FLOMAX) 0.4 MG CAPS Take 0.4 mg by mouth daily.    Yes Historical Provider, MD  traMADol (ULTRAM) 50 MG tablet Take 50 mg by mouth every 6 (six) hours as needed for moderate pain or severe pain.   Yes Historical Provider, MD  trospium (SANCTURA) 20 MG tablet Take 20 mg by mouth daily.   Yes Historical Provider, MD  warfarin (COUMADIN) 5 MG tablet Take 0.5-1 tablets (2.5-5 mg total) by mouth daily at 6 PM. TAKE 2.5mg  on Sun/Tues/Fri's and Take 5 mg on all other days 06/30/15  Yes Marletta Lor, MD    Physical Exam: Filed Vitals:   07/31/2015 1656 07/08/2015 1830 07/31/2015 1900 07/16/2015 2117  BP:  126/107 113/54 102/63  Pulse:  117 48 103  Temp:      TempSrc:      Resp:  26 21 24   Height:      Weight:      SpO2: 97% 95% 99% 95%    General: Alert, Awake and Oriented to Time, Place and Person. Appear in moderate distress Eyes: PERRL ENT:  Oral Mucosa clear moist. Neck: no JVD Cardiovascular: S1 and S2 Present, aortic systolic  Murmur, Peripheral Pulses Present Respiratory: Bilateral Air entry equal and Decreased,  Bilateral basal  Crackles, no wheezes Abdomen: Bowel Sound present, Soft and non tenderness Skin: no Rash Extremities: Trace  Pedal edema, no calf tenderness Neurologic: Grossly no focal neuro deficit.  Labs on Admission:  CBC:  Recent Labs Lab 07/06/2015 1758  WBC 7.9  NEUTROABS 5.7  HGB 9.3*  HCT 31.4*  MCV 89.5  PLT 243    CMP     Component Value Date/Time   NA 141 07/14/2015 1758   K 4.7 07/13/2015 1758   CL 104 08/01/2015 1758   CO2 30 08/04/2015 1758   GLUCOSE 134* 07/23/2015 1758   BUN 83* 07/24/2015 1758   CREATININE 2.75* 07/20/2015 1758   CALCIUM 10.5* 07/20/2015 1758   PROT 7.4 07/15/2015 1758   ALBUMIN 2.9* 07/13/2015 1758   AST 16 08/04/2015 1758   ALT <5* 08/02/2015 1758   ALKPHOS 230* 07/28/2015 1758   BILITOT 0.4 08/02/2015 1758   GFRNONAA 20* 08/04/2015 1758   GFRAA 23* 07/23/2015 1758    No results for input(s): LIPASE, AMYLASE in the last 168 hours.   Recent Labs Lab 08/01/2015 1758  TROPONINI <0.03   BNP (last 3 results)  Recent Labs  11/29/14 0820 07/24/2015 1759  BNP 48.4 54.4    ProBNP (last 3 results)  Recent Labs  11/12/14 0941  PROBNP 440.4     Radiological Exams on Admission: Dg Chest 2 View  07/12/2015   CLINICAL DATA:  Patient diagnosed with left lower lobe pneumonia approximately 1 month ago, presenting with acute onset of generalized weakness and shortness of breath when lying in the supine position. CT  EXAM: CHEST  2 VIEW  COMPARISON:  07/08/2015 and earlier, including CT chest 06/24/2015.  FINDINGS: AP semi-erect and lateral images were obtained. Cardiac silhouette moderately enlarged, unchanged. Dense consolidation in the left lower lobe, unchanged dating back to 06/23/2015. Interval significant increase in  size of the now very large left  pleural effusion since the most recent examination 2 weeks ago. Stable small right pleural effusion and chronic linear scarring in the right lower lobe. Pulmonary venous hypertension, worse than the examination 2 weeks ago, without overt edema. Degenerative changes again noted throughout the thoracic spine.  IMPRESSION: 1. Interval significant increase in size of a now very large left pleural effusion since the most recent examination 2 weeks ago. 2. Dense consolidation in the left lower lobe, unchanged dating back to 06/23/2015, likely representing persistent pneumonia. 3. Stable small right pleural effusion. Stable chronic linear scarring in the right lower lobe. 4. Stable moderate cardiomegaly. Pulmonary venous hypertension, worse than the examination 2 days ago, without overt edema.   Electronically Signed   By: Evangeline Dakin M.D.   On: 07/11/2015 18:35   Assessment/Plan Principal Problem:   Acute respiratory failure with hypoxia Active Problems:   Hypothyroidism   Atrial fibrillation   Parkinson's disease   Long term current use of anticoagulant therapy   CKD (chronic kidney disease) stage 4, GFR 15-29 ml/min   Generalized weakness   Diabetes mellitus, type 2   Diabetic neuropathy   1. Acute respiratory failure with hypoxia The patient is presenting with complains of progressively worsening shortness of breath. He is requiring 4 L of oxygen. ABG shows both hypoxia as well as hypercarbia. Chest x-ray shows worsening left-sided pleural effusion with possible left-sided consolidation as well. Progressive troponin is mildly elevated. With this the patient will be admitted in the hospital in stepdown unit. I would use incentive spirometry as well as broad-spectrum antibiotics due to his recent hospitalization. We will also get IR guided thoracentesis in the morning. Patient may require pulmonary consultation. Patient did have cardiac thoracic consultation in the past for recurrent  pleural right-sided effusion for Pleurx catheter as well.  2.Atrial fibrillation. Currently rate controlled. We will continue home medication as well as continue Coumadin.  3.Hypothyroidism. Continue Synthroid.  4.Chronic kidney disease. Currently stable monitor daily ins and outs. Avoiding nephrotoxic medications. Renal antibiotics dozing her pharmacy.  5.Parkinson's disease. Continuing Sinemet at home doses.  6. Diabetes mellitus type 2. In the setting of him remaining nothing by mouth after midnight I would reduce his Lantus dose and place him on insulin sliding scale.  7. Possible UTI. Aztreonam will cover him for broad-spectrum urinary infection  Advance goals of care discussion: DNR/DNI as per my discussion with patient   DVT Prophylaxi on therapeutic anticoagulation. Nutrition: Nothing by mouth after midnight  Disposition: Admitted as inpatient, step-down unit.  Author: Berle Mull, MD [facility] Pager: [phone] 07/30/2015  If 7PM-7AM, please contact night-coverage [email or web address] Password [email or web address]

## 2015-07-21 NOTE — ED Notes (Signed)
Patient arrived via EMS from home.  He had pneumonia 2 months ago and patient states he feels weak and tired.  Per EMS, crackles in lower base of lungs.  Home health nurse said he wasn't himself.  When patient lays flat, he becomes SOB.  Patient does have a foley catheter.  Urine is dark per EMS.  Home health nurse states he is eating good.  CBG 177 RR: 16 BP: 118/70 Pulse:  80-100 range

## 2015-07-21 NOTE — ED Provider Notes (Signed)
Medical screening examination/treatment/procedure(s) were conducted as a shared visit with non-physician practitioner(s) and myself.  I personally evaluated the patient during the encounter.   EKG Interpretation None     81 year old male presenting with recurrent left pleural effusion and consolidation of the left lung. There is decreased breath sounds on the left is consistent with this. Signs of possible colonization or infection of his indwelling urinary catheter, will cover for infection and admit for inpatient management.  See related encounter note   Leo Grosser, MD 08/02/2015 2126

## 2015-07-21 NOTE — ED Notes (Signed)
Portable EKG was completed

## 2015-07-21 NOTE — Progress Notes (Signed)
ANTIBIOTIC CONSULT NOTE - INITIAL  Pharmacy Consult for Vancomycin, Aztreonam Indication: HCAP  Allergies  Allergen Reactions  . Penicillins Rash    Happened 60 years ago; Tolerates Cephs    Patient Measurements: Height: 6\' 1"  (185.4 cm) Weight: 219 lb (99.338 kg) IBW/kg (Calculated) : 79.9  Vital Signs: Temp: 98.2 F (36.8 C) (08/16 1654) Temp Source: Oral (08/16 1654) BP: 113/54 mmHg (08/16 1900) Pulse Rate: 48 (08/16 1900) Intake/Output from previous day:   Intake/Output from this shift:    Labs:  Recent Labs  07/22/2015 1758  WBC 7.9  HGB 9.3*  PLT 243  CREATININE 2.75*   Estimated Creatinine Clearance: 25.7 mL/min (by C-G formula based on Cr of 2.75). No results for input(s): VANCOTROUGH, VANCOPEAK, VANCORANDOM, GENTTROUGH, GENTPEAK, GENTRANDOM, TOBRATROUGH, TOBRAPEAK, TOBRARND, AMIKACINPEAK, AMIKACINTROU, AMIKACIN in the last 72 hours.   Microbiology: Recent Results (from the past 720 hour(s))  Urine culture     Status: None   Collection Time: 06/23/15  2:44 PM  Result Value Ref Range Status   Specimen Description URINE, CATHETERIZED  Final   Special Requests NONE  Final   Culture >=100,000 COLONIES/mL PROTEUS MIRABILIS  Final   Report Status 06/25/2015 FINAL  Final   Organism ID, Bacteria PROTEUS MIRABILIS  Final      Susceptibility   Proteus mirabilis - MIC*    AMPICILLIN <=2 SENSITIVE Sensitive     CEFAZOLIN <=4 SENSITIVE Sensitive     CEFTRIAXONE <=1 SENSITIVE Sensitive     CIPROFLOXACIN 2 INTERMEDIATE Intermediate     GENTAMICIN <=1 SENSITIVE Sensitive     IMIPENEM 1 SENSITIVE Sensitive     NITROFURANTOIN 64 RESISTANT Resistant     TRIMETH/SULFA <=20 SENSITIVE Sensitive     AMPICILLIN/SULBACTAM <=2 SENSITIVE Sensitive     PIP/TAZO <=4 SENSITIVE Sensitive     * >=100,000 COLONIES/mL PROTEUS MIRABILIS  Urine culture     Status: None   Collection Time: 06/24/15 10:00 AM  Result Value Ref Range Status   Specimen Description URINE, RANDOM  Final    Special Requests NONE  Final   Culture 3,000 COLONIES/mL INSIGNIFICANT GROWTH  Final   Report Status 06/25/2015 FINAL  Final    Medical History: Past Medical History  Diagnosis Date  . Family history of malignant neoplasm of gastrointestinal tract   . Unspecified constipation   . Abnormal involuntary movements(781.0)   . Vitamin B12 deficiency   . Claudication   . Hypertrophy of prostate with urinary obstruction and other lower urinary tract symptoms (LUTS)   . Obesity   . Mixed hyperlipidemia   . Atrial fibrillation     --myoview 07/2006: not gated. No ischemia or infarct  Diastolic HF-- Echo 03/4009 ER60% mild AI/MR. RV mild to moderately dilated/ dysfunction. ? restrictive CM . RVSP 36  . Bladder polyps   . Kidney stones   . Unspecified sleep apnea     NPSG 05/14/2007- AHI 80.7/ hr  . OSA (obstructive sleep apnea)     "tried mask; couldn't use it; so I quit" (11/12/2014)  . Hypothyroidism   . History of hiatal hernia   . Daily headache     "I've had headaches all my life; get them almost daily" (11/12/2014)  . Stroke ~ 1985    "simple stroke" denies residual on 11/12/2014  . Arthritis     "just about qwhere"   . Pleural effusion 11/12/2014 admission  . Dysrhythmia     Afib  . Type II or unspecified type diabetes mellitus with neurological manifestations,  not stated as uncontrolled   . Shortness of breath dyspnea     walking distance or climbing stairs  . Neuromuscular disorder     Parkinson's / age 23 had growth per left shoulder/neck area that stopped blood flow to left arm was removed and has not no further problems.    Medications:  Anti-infectives    Start     Dose/Rate Route Frequency Ordered Stop   07/27/2015 2030  aztreonam (AZACTAM) 2 g in dextrose 5 % 50 mL IVPB     2 g 100 mL/hr over 30 Minutes Intravenous  Once 08/02/2015 2027     07/18/2015 2030  vancomycin (VANCOCIN) IVPB 1000 mg/200 mL premix     1,000 mg 200 mL/hr over 60 Minutes Intravenous  Once 07/08/2015  2027     07/31/2015 2015  cefTRIAXone (ROCEPHIN) 1 g in dextrose 5 % 50 mL IVPB  Status:  Discontinued     1 g 100 mL/hr over 30 Minutes Intravenous  Once 07/23/2015 2013 07/18/2015 2024     Assessment: 81 yoM presented to ED on 8/16 with SOB and weakness.  Significant PMH includes COPD, chronic bronchitis, right sided CHF, and chronic respiratory failure with hypoxia.  He is followed by [facility] for OSA, recurrent Rt pleural effusion, etc.  CXR 8/16 shows increasing, large left pleural effusion, LLL consolidation unchanged since 06/23/15 likely representing persistent pneumonia, small right pleural effusion.  Pharmacy is consulted to dose Vancomycin and Zosyn for HCAP.  8/16 >> Vanc >> 8/16 >> Aztreonam >>    Today, 07/20/2015:  Tm 98.2  WBC 7.9  SCr 2.75 with CrCl ~ 26 ml/min  Lactic acid 0.56  Blood and sputum cultures ordered   Goal of Therapy:  Vancomycin trough level 15-20 mcg/ml  Plan:   Aztreonam 2g IV once, then 1g IV q8h  Vancomycin 1g IV q24h.  Measure Vanc trough at steady state.  Follow up renal fxn, culture results, and clinical course.   Gretta Arab PharmD, BCPS Pager [phone] 07/26/2015 8:45 PM

## 2015-07-21 NOTE — ED Provider Notes (Signed)
CSN: [id number]     Arrival date & time 07/06/2015  1628 History   First MD Initiated Contact with Patient 07/17/2015 1632     Chief Complaint  Patient presents with  . Weakness  . Shortness of Breath     (Consider location/radiation/quality/duration/timing/severity/associated sxs/prior Treatment) HPI Patient presents to the emergency department with weakness, increasing shortness of breath over the last few months.  Patient states he had pneumonia 2 months ago.  Patient states that he has not felt like himself over the last few weeks.  Patient states that the oxygen that EMS gave him made him feel better.  The patient states that nothing seems make his condition better, but exertion makes his shortness of breath or worse.  Patient states that he did not take any medications prior to arrival.  The patient denies chest pain, nausea, vomiting, dizziness, headache, blurred vision, abdominal pain, diarrhea, lightheadedness or syncope.  The patient states that he has followed up with his primary care doctor since his last bout of pneumonia Past Medical History  Diagnosis Date  . Family history of malignant neoplasm of gastrointestinal tract   . Unspecified constipation   . Abnormal involuntary movements(781.0)   . Vitamin B12 deficiency   . Claudication   . Hypertrophy of prostate with urinary obstruction and other lower urinary tract symptoms (LUTS)   . Obesity   . Mixed hyperlipidemia   . Atrial fibrillation     --myoview 07/2006: not gated. No ischemia or infarct  Diastolic HF-- Echo 07/6766 ER60% mild AI/MR. RV mild to moderately dilated/ dysfunction. ? restrictive CM . RVSP 36  . Bladder polyps   . Kidney stones   . Unspecified sleep apnea     NPSG 05/14/2007- AHI 80.7/ hr  . OSA (obstructive sleep apnea)     "tried mask; couldn't use it; so I quit" (11/12/2014)  . Hypothyroidism   . History of hiatal hernia   . Daily headache     "I've had headaches all my life; get them almost daily"  (11/12/2014)  . Stroke ~ 1985    "simple stroke" denies residual on 11/12/2014  . Arthritis     "just about qwhere"   . Pleural effusion 11/12/2014 admission  . Dysrhythmia     Afib  . Type II or unspecified type diabetes mellitus with neurological manifestations, not stated as uncontrolled   . Shortness of breath dyspnea     walking distance or climbing stairs  . Neuromuscular disorder     Parkinson's / age 20 had growth per left shoulder/neck area that stopped blood flow to left arm was removed and has not no further problems.   Past Surgical History  Procedure Laterality Date  . Appendectomy    . Kidney stone surgery      Kidney stone retrieval with uretal stent x 2   . Bladder polyps      many times  last time 12/15  . Transurethral resection of bladder    . Orif elbow fracture Left 1976  . Fracture surgery    . Excisional hemorrhoidectomy  1950's  . Ureteral stent placement  X 1  . Cystoscopy w/ stone manipulation  X 2  . Cataract extraction w/ intraocular lens  implant, bilateral Bilateral   . Eye surgery Bilateral     catarct  . Chest tube insertion Right 12/12/2014    Procedure: INSERTION PLEURAL DRAINAGE CATHETER;  Surgeon: Ivin Poot, MD;  Location: [facility];  Service: Thoracic;  Laterality: Right;  .  Colonscopy       removal of polyps  . Cystoscopy N/A 01/15/2015    Procedure: CYSTOSCOPY;  Surgeon: Bernestine Amass, MD;  Location: [facility];  Service: Urology;  Laterality: N/A;  . Transurethral resection of bladder tumor N/A 01/15/2015    Procedure: TRANSURETHRAL RESECTION OF BLADDER TUMOR (TURBT);  Surgeon: Bernestine Amass, MD;  Location: [facility];  Service: Urology;  Laterality: N/A;  . Removal of pleural drainage catheter Right 02/23/2015    Procedure: REMOVAL OF PLEURAL DRAINAGE CATHETER;  Surgeon: Ivin Poot, MD;  Location: [facility];  Service: Thoracic;  Laterality: Right;   Family History  Problem Relation Age of Onset  . Lung cancer Mother 33    nonsmoker  . Colon  cancer Sister   . Prostate cancer Paternal Uncle   . Stroke Father   . Hip fracture Father   . Healthy Son   . Healthy Daughter    Social History  Substance Use Topics  . Smoking status: Former Smoker -- 1.00 packs/day for 35 years    Types: Cigarettes    Quit date: 12/05/1985  . Smokeless tobacco: Never Used  . Alcohol Use: No    Review of Systems  All other systems negative except as documented in the HPI. All pertinent positives and negatives as reviewed in the HPI.  Allergies  Penicillins  Home Medications   Prior to Admission medications   Medication Sig Start Date End Date Taking? Authorizing Provider  acetaminophen (TYLENOL) 500 MG tablet Take 500 mg by mouth every 6 (six) hours as needed for mild pain.   Yes Historical Provider, MD  carbidopa-levodopa (SINEMET IR) 25-100 MG per tablet Take 1 tablet by mouth 3 (three) times daily. 01/07/15  Yes Donika K Patel, DO  Cyanocobalamin (VITAMIN B 12 PO) Take 1 tablet by mouth daily.   Yes Historical Provider, MD  diazepam (VALIUM) 2 MG tablet Take 2 mg by mouth every 6 (six) hours as needed for anxiety.   Yes Historical Provider, MD  furosemide (LASIX) 20 MG tablet 1 tablet Monday, Wednesday, Friday only Patient taking differently: Take 20 mg by mouth every Monday, Wednesday, and Friday. 1 tablet Monday, Wednesday, Friday only 06/24/14  Yes Marletta Lor, MD  gabapentin (NEURONTIN) 100 MG capsule TAKE 2 CAPSULES BY MOUTH EACH EVENING AND ONE CAPSULE AT BEDTIME Patient taking differently: TAKE 2 CAPSULES BY MOUTH at night and  ONE CAPSULE AT noon 06/17/15  Yes Marletta Lor, MD  Insulin Glargine (LANTUS SOLOSTAR) 100 UNIT/ML Solostar Pen Inject 50 Units into the skin daily at 10 pm. 06/26/15  Yes Reyne Dumas, MD  levothyroxine (SYNTHROID, LEVOTHROID) 100 MCG tablet TAKE 1 TABLET BY MOUTH ONCE DAILY 05/27/15  Yes Marletta Lor, MD  lovastatin (MEVACOR) 20 MG tablet TAKE 1 TABLET BY MOUTH DAILY 04/24/15  Yes Marletta Lor, MD  metoprolol succinate (TOPROL-XL) 100 MG 24 hr tablet TAKE 1/2 TABLET BY MOUTH DAILY 07/04/15  Yes Marletta Lor, MD  Polyethyl Glycol-Propyl Glycol 0.4-0.3 % SOLN Apply 1 drop to eye daily as needed (dry eye).   Yes Historical Provider, MD  polyethylene glycol (MIRALAX) powder Take 17 g by mouth at bedtime. For regularity   Yes Historical Provider, MD  sertraline (ZOLOFT) 25 MG tablet Take 1 tablet (25 mg total) by mouth daily. 12/30/14  Yes Marletta Lor, MD  Tamsulosin HCl (FLOMAX) 0.4 MG CAPS Take 0.4 mg by mouth daily.    Yes Historical Provider, MD  traMADol (  ULTRAM) 50 MG tablet Take 50 mg by mouth every 6 (six) hours as needed for moderate pain or severe pain.   Yes Historical Provider, MD  trospium (SANCTURA) 20 MG tablet Take 20 mg by mouth daily.   Yes Historical Provider, MD  warfarin (COUMADIN) 5 MG tablet Take 0.5-1 tablets (2.5-5 mg total) by mouth daily at 6 PM. TAKE 2.5mg  on Sun/Tues/Fri's and Take 5 mg on all other days 06/30/15  Yes Marletta Lor, MD   BP 110/60 mmHg  Pulse 107  Temp(Src) 98.2 F (36.8 C) (Oral)  Resp 16  Ht 6\' 1"  (1.854 m)  Wt 219 lb (99.338 kg)  BMI 28.90 kg/m2  SpO2 97% Physical Exam  Constitutional: He is oriented to person, place, and time. He appears well-developed and well-nourished. No distress.  HENT:  Head: Normocephalic and atraumatic.  Mouth/Throat: Oropharynx is clear and moist.  Eyes: Pupils are equal, round, and reactive to light.  Neck: Normal range of motion. Neck supple.  Cardiovascular: Normal rate, regular rhythm and normal heart sounds.  Exam reveals no gallop and no friction rub.   No murmur heard. Pulmonary/Chest: Effort normal. No respiratory distress. He has decreased breath sounds in the left upper field, the left middle field and the left lower field. He has no wheezes. He has rhonchi. He has no rales.  Abdominal: Soft. Bowel sounds are normal. He exhibits no distension. There is no tenderness.   Musculoskeletal: He exhibits edema.  Neurological: He is alert and oriented to person, place, and time. He exhibits normal muscle tone. Coordination normal.  Skin: Skin is warm and dry. No rash noted. No erythema.  Psychiatric: He has a normal mood and affect. His behavior is normal.    ED Course  Procedures (including critical care time) Labs Review Labs Reviewed - No data to display  Imaging Review No results found. I have personally reviewed and evaluated these images and lab results as part of my medical decision-making.   EKG Interpretation None      Patient needed to be admitted for large pleural effusion and persistent pneumonia with the [facility] who agrees to admit the patient   Dalia Heading, PA-C 07/23/15 0159  Leo Grosser, MD 07/23/15 [phone]

## 2015-07-21 NOTE — ED Notes (Signed)
Bed: EI35 Expected date:  Expected time:  Means of arrival:  Comments: EMS/elderly M/ SOB

## 2015-07-21 NOTE — ED Notes (Signed)
Patient transported to X-ray 

## 2015-07-21 NOTE — Telephone Encounter (Signed)
Dr. Raliegh Ip notified pt went to ED.

## 2015-07-21 NOTE — Telephone Encounter (Signed)
[name] [name] from [facility] called due the patient being sent to ED. Patient has increased SOB and oxygen is 85 has been up to 90 and back down and very weak. If you have any questions please give her a call at [phone].

## 2015-07-22 ENCOUNTER — Inpatient Hospital Stay (HOSPITAL_COMMUNITY): Payer: Medicare Other

## 2015-07-22 DIAGNOSIS — E119 Type 2 diabetes mellitus without complications: Secondary | ICD-10-CM

## 2015-07-22 DIAGNOSIS — E1140 Type 2 diabetes mellitus with diabetic neuropathy, unspecified: Secondary | ICD-10-CM | POA: Diagnosis present

## 2015-07-22 LAB — GLUCOSE, CAPILLARY
GLUCOSE-CAPILLARY: 137 mg/dL — AB (ref 65–99)
GLUCOSE-CAPILLARY: 128 mg/dL — AB (ref 65–99)
GLUCOSE-CAPILLARY: 139 mg/dL — AB (ref 65–99)
GLUCOSE-CAPILLARY: 154 mg/dL — AB (ref 65–99)

## 2015-07-22 LAB — PROTEIN, BODY FLUID: Total protein, fluid: 5.2 g/dL

## 2015-07-22 LAB — CBG MONITORING, ED
GLUCOSE-CAPILLARY: 139 mg/dL — AB (ref 65–99)
Glucose-Capillary: 144 mg/dL — ABNORMAL HIGH (ref 65–99)
Glucose-Capillary: 151 mg/dL — ABNORMAL HIGH (ref 65–99)
Glucose-Capillary: 194 mg/dL — ABNORMAL HIGH (ref 65–99)

## 2015-07-22 LAB — PH, BODY FLUID: PH, FLUID: 8.5

## 2015-07-22 LAB — COMPREHENSIVE METABOLIC PANEL
ALBUMIN: 2.8 g/dL — AB (ref 3.5–5.0)
ALK PHOS: 238 U/L — AB (ref 38–126)
ALT: 6 U/L — ABNORMAL LOW (ref 17–63)
AST: 13 U/L — AB (ref 15–41)
Anion gap: 7 (ref 5–15)
BILIRUBIN TOTAL: 0.5 mg/dL (ref 0.3–1.2)
BUN: 84 mg/dL — AB (ref 6–20)
CALCIUM: 10.2 mg/dL (ref 8.9–10.3)
CO2: 30 mmol/L (ref 22–32)
Chloride: 103 mmol/L (ref 101–111)
Creatinine, Ser: 2.73 mg/dL — ABNORMAL HIGH (ref 0.61–1.24)
GFR calc Af Amer: 23 mL/min — ABNORMAL LOW (ref >60)
GFR calc non Af Amer: 20 mL/min — ABNORMAL LOW (ref >60)
GLUCOSE: 151 mg/dL — AB (ref 65–99)
Potassium: 4.6 mmol/L (ref 3.5–5.1)
Sodium: 140 mmol/L (ref 135–145)
TOTAL PROTEIN: 7.5 g/dL (ref 6.5–8.1)

## 2015-07-22 LAB — CBC WITH DIFFERENTIAL/PLATELET
BASOS ABS: 0 10*3/uL (ref 0.0–0.1)
BASOS PCT: 0 % (ref 0–1)
Eosinophils Absolute: 0.2 10*3/uL (ref 0.0–0.7)
Eosinophils Relative: 2 % (ref 0–5)
HEMATOCRIT: 31.2 % — AB (ref 39.0–52.0)
HEMOGLOBIN: 9.4 g/dL — AB (ref 13.0–17.0)
Lymphocytes Relative: 13 % (ref 12–46)
Lymphs Abs: 1.1 10*3/uL (ref 0.7–4.0)
MCH: 27.4 pg (ref 26.0–34.0)
MCHC: 30.1 g/dL (ref 30.0–36.0)
MCV: 91 fL (ref 78.0–100.0)
MONOS PCT: 10 % (ref 3–12)
Monocytes Absolute: 0.8 10*3/uL (ref 0.1–1.0)
NEUTROS ABS: 6 10*3/uL (ref 1.7–7.7)
NEUTROS PCT: 75 % (ref 43–77)
Platelets: 244 10*3/uL (ref 150–400)
RBC: 3.43 MIL/uL — ABNORMAL LOW (ref 4.22–5.81)
RDW: 16.3 % — ABNORMAL HIGH (ref 11.5–15.5)
WBC: 8.1 10*3/uL (ref 4.0–10.5)

## 2015-07-22 LAB — PROTIME-INR
INR: 3.37 — AB (ref 0.00–1.49)
Prothrombin Time: 33.4 seconds — ABNORMAL HIGH (ref 11.6–15.2)

## 2015-07-22 LAB — STREP PNEUMONIAE URINARY ANTIGEN: STREP PNEUMO URINARY ANTIGEN: NEGATIVE

## 2015-07-22 LAB — GLUCOSE, SEROUS FLUID: Glucose, Fluid: 126 mg/dL

## 2015-07-22 LAB — BODY FLUID CELL COUNT WITH DIFFERENTIAL
Lymphs, Fluid: 15 %
MONOCYTE-MACROPHAGE-SEROUS FLUID: 62 % (ref 50–90)
Neutrophil Count, Fluid: 23 % (ref 0–25)
Total Nucleated Cell Count, Fluid: 747 cu mm (ref 0–1000)

## 2015-07-22 LAB — MRSA PCR SCREENING: MRSA BY PCR: POSITIVE — AB

## 2015-07-22 LAB — LEGIONELLA ANTIGEN, URINE

## 2015-07-22 LAB — LACTATE DEHYDROGENASE, PLEURAL OR PERITONEAL FLUID: LD FL: 234 U/L — AB (ref 3–23)

## 2015-07-22 LAB — LACTATE DEHYDROGENASE: LDH: 72 U/L — ABNORMAL LOW (ref 98–192)

## 2015-07-22 MED ORDER — WARFARIN - PHARMACIST DOSING INPATIENT: Freq: Every day | Status: DC

## 2015-07-22 NOTE — Care Management Note (Signed)
Case Management Note  Patient Details  Name: CHARLETON DEYOUNG MRN: [id number] Date of Birth: 1931-03-01  Subjective/Objective:       hypoxia             Action/Plan:Date:  July 22, 2015 U.R. performed for needs and level of care. Will continue to follow for Case Management needs.  Velva Harman, RN, BSN, [place]   [phone]  Expected Discharge Date:   (unknown)               Expected Discharge Plan:  Home/Self Care  In-House Referral:  NA  Discharge planning Services  CM Consult  Post Acute Care Choice:  NA Choice offered to:  NA  DME Arranged:    DME Agency:     HH Arranged:    HH Agency:     Status of Service:  In process, will continue to follow  Medicare Important Message Given:    Date Medicare IM Given:    Medicare IM give by:    Date Additional Medicare IM Given:    Additional Medicare Important Message give by:     If discussed at [place] of Stay Meetings, dates discussed:    Additional Comments:  Leeroy Cha, RN 07/22/2015, 4:11 PM

## 2015-07-22 NOTE — Progress Notes (Signed)
ANTICOAGULATION CONSULT NOTE - Initial Consult  Pharmacy Consult for Coumadin Indication: atrial fibrillation  Allergies  Allergen Reactions  . Penicillins Rash    Happened 60 years ago; Tolerates Cephs    Patient Measurements: Height: 6\' 1"  (185.4 cm) Weight: 219 lb (99.338 kg) IBW/kg (Calculated) : 79.9  Vital Signs: BP: 112/66 mmHg (08/17 0600) Pulse Rate: 74 (08/17 0600)  Labs:  Recent Labs  07/13/2015 1758 07/22/15 0508 07/22/15 0604  HGB 9.3*  --  9.4*  HCT 31.4*  --  31.2*  PLT 243  --  244  LABPROT 32.1* 33.4*  --   INR 3.19* 3.37*  --   CREATININE 2.75*  --  2.73*  TROPONINI <0.03  --   --     Estimated Creatinine Clearance: 25.9 mL/min (by C-G formula based on Cr of 2.73).   Medical History: Past Medical History  Diagnosis Date  . Family history of malignant neoplasm of gastrointestinal tract   . Unspecified constipation   . Abnormal involuntary movements(781.0)   . Vitamin B12 deficiency   . Claudication   . Hypertrophy of prostate with urinary obstruction and other lower urinary tract symptoms (LUTS)   . Obesity   . Mixed hyperlipidemia   . Atrial fibrillation     --myoview 07/2006: not gated. No ischemia or infarct  Diastolic HF-- Echo 06/3531 ER60% mild AI/MR. RV mild to moderately dilated/ dysfunction. ? restrictive CM . RVSP 36  . Bladder polyps   . Kidney stones   . Unspecified sleep apnea     NPSG 05/14/2007- AHI 80.7/ hr  . OSA (obstructive sleep apnea)     "tried mask; couldn't use it; so I quit" (11/12/2014)  . Hypothyroidism   . History of hiatal hernia   . Daily headache     "I've had headaches all my life; get them almost daily" (11/12/2014)  . Stroke ~ 1985    "simple stroke" denies residual on 11/12/2014  . Arthritis     "just about qwhere"   . Pleural effusion 11/12/2014 admission  . Dysrhythmia     Afib  . Type II or unspecified type diabetes mellitus with neurological manifestations, not stated as uncontrolled   . Shortness  of breath dyspnea     walking distance or climbing stairs  . Neuromuscular disorder     Parkinson's / age 20 had growth per left shoulder/neck area that stopped blood flow to left arm was removed and has not no further problems.    Medications:  Coumadin 5mg  po daily except 2.5mg  on Tu/Th/Sa  Assessment: 81 yo M admitted with HCAP on chronic Coumadin for Afib.  INR elevated on admission (3.19).  Last dose taken PTA 8/15.   Per outpatient flow sheet patient has been on current regimen for several months with mostly therapeutic INRs.  At most recent visit (07/14/15) INR was 3.5.  Dose was held x1 day.      07/22/2015: INR trending up (3.37) No bleeding noted  Goal of Therapy:  INR 2-3   Plan:  Hold Coumadin today Daily INR  [name] [name], [place] Drafts 07/22/2015,7:38 AM

## 2015-07-22 NOTE — Progress Notes (Signed)
[facility] PROGRESS NOTE  [name] [name] ACZ:[id number] DOB: 11/02/1931 DOA: 07/23/2015 PCP: Nyoka Cowden, MD  Assessment/Plan: 1. Acute respiratory failure with hypoxia 1. Imaging with worsening L sided pleural effusion with consolidation 2. On vancomycin and aztreonam 3. Pt is now s/p IR guided thoracentesis yielding 1L of fluid - exudative fluid per fluid analysis 4. Will obtain repeat chest CT for interval change 5. F/u fluid culture 6. Pending results of CT, would likely benefit from CT surgery consultation - f/u results 2. Afib 1. Stable 2. Controlled. 3. Cont coumadin 3. Hypothyroid 1. Cont thyroid replacement as tolerated 4. CKD 1. Cr appears to be near baseline 2. Will cont to monitor closely and  5. Parkinson's disease 1. stable 6. DM2 1. Cont SSI coverage 7. Questionable UTI 8. DVT prophylaxis 1. Full anticoagulation per above  Code Status: DNR Family Communication: Pt in room (indicate person spoken with, relationship, and if by phone, the number) Disposition Plan: Pending   Consultants:    Procedures:    Antibiotics:  Vancomycin 8/16>>>  Aztreonam 8/16>>> (indicate start date, and stop date if known)  HPI/Subjective: States breathing better this AM.  Objective: Filed Vitals:   07/22/15 1330 07/22/15 1354 07/22/15 1400 07/22/15 1446  BP: 101/56  91/63 110/57  Pulse: 105 105 66 88  Temp:      TempSrc:      Resp: 20 20 19 22   Height:      Weight:      SpO2: 97% 96% 96% 96%    Intake/Output Summary (Last 24 hours) at 07/22/15 1617 Last data filed at 07/22/15 0006  Gross per 24 hour  Intake    500 ml  Output      0 ml  Net    500 ml   Filed Weights   07/30/2015 1654  Weight: 99.338 kg (219 lb)    Exam:   General:  Awake, in nad   Cardiovascular: regular, s1, s2  Respiratory: normal resp effort, no wheezing  Abdomen: soft, nondistended  Musculoskeletal: perfused, no clubbing   Data Reviewed: Basic  Metabolic Panel:  Recent Labs Lab 07/22/2015 1758 07/22/15 0604  NA 141 140  K 4.7 4.6  CL 104 103  CO2 30 30  GLUCOSE 134* 151*  BUN 83* 84*  CREATININE 2.75* 2.73*  CALCIUM 10.5* 10.2   Liver Function Tests:  Recent Labs Lab 08/02/2015 1758 07/22/15 0604  AST 16 13*  ALT <5* 6*  ALKPHOS 230* 238*  BILITOT 0.4 0.5  PROT 7.4 7.5  ALBUMIN 2.9* 2.8*   No results for input(s): LIPASE, AMYLASE in the last 168 hours. No results for input(s): AMMONIA in the last 168 hours. CBC:  Recent Labs Lab 07/26/2015 1758 07/22/15 0604  WBC 7.9 8.1  NEUTROABS 5.7 6.0  HGB 9.3* 9.4*  HCT 31.4* 31.2*  MCV 89.5 91.0  PLT 243 244   Cardiac Enzymes:  Recent Labs Lab 08/03/2015 1758  TROPONINI <0.03   BNP (last 3 results)  Recent Labs  11/29/14 0820 07/25/2015 1759  BNP 48.4 54.4    ProBNP (last 3 results)  Recent Labs  11/12/14 0941  PROBNP 440.4    CBG:  Recent Labs Lab 07/23/2015 2159 07/22/15 0028 07/22/15 0520 07/22/15 1059 07/22/15 1135  GLUCAP 147* 194* 144* 139* 151*    Recent Results (from the past 240 hour(s))  Gram stain     Status: None (Preliminary result)   Collection Time: 07/22/15 10:36 AM  Result Value Ref Range Status  Specimen Description FLUID PLEURAL  Final   Special Requests NONE  Final   Gram Stain   Final    MODERATE WBC PRESENT,BOTH PMN AND MONONUCLEAR NO ORGANISMS SEEN Performed at [facility]    Report Status PENDING  Incomplete     Studies: Dg Chest 1 View  07/22/2015   CLINICAL DATA:  Left thoracentesis.  EXAM: CHEST  1 VIEW  COMPARISON:  Two-view chest x-ray 07/07/2015.  FINDINGS: A moderate size left pleural effusion persist. There is no pneumothorax. A small right pleural effusion is stable with some fluid extending into the minor fissure.  The left pleural effusion obscures the the heart. Atherosclerotic changes are noted at the aortic arch. Mild edema is present.  IMPRESSION: 1. Mild decrease in left pleural  effusion without evidence for pneumothorax. 2. Cardiomegaly and edema compatible with congestive heart failure. 3. Atherosclerosis.   Electronically Signed   By: San Morelle M.D.   On: 07/22/2015 11:20   Dg Chest 2 View  07/06/2015   CLINICAL DATA:  Patient diagnosed with left lower lobe pneumonia approximately 1 month ago, presenting with acute onset of generalized weakness and shortness of breath when lying in the supine position. CT  EXAM: CHEST  2 VIEW  COMPARISON:  07/08/2015 and earlier, including CT chest 06/24/2015.  FINDINGS: AP semi-erect and lateral images were obtained. Cardiac silhouette moderately enlarged, unchanged. Dense consolidation in the left lower lobe, unchanged dating back to 06/23/2015. Interval significant increase in size of the now very large left pleural effusion since the most recent examination 2 weeks ago. Stable small right pleural effusion and chronic linear scarring in the right lower lobe. Pulmonary venous hypertension, worse than the examination 2 weeks ago, without overt edema. Degenerative changes again noted throughout the thoracic spine.  IMPRESSION: 1. Interval significant increase in size of a now very large left pleural effusion since the most recent examination 2 weeks ago. 2. Dense consolidation in the left lower lobe, unchanged dating back to 06/23/2015, likely representing persistent pneumonia. 3. Stable small right pleural effusion. Stable chronic linear scarring in the right lower lobe. 4. Stable moderate cardiomegaly. Pulmonary venous hypertension, worse than the examination 2 days ago, without overt edema.   Electronically Signed   By: Evangeline Dakin M.D.   On: 07/27/2015 18:35   US Thoracentesis Asp Pleural Space W/img Guide  07/22/2015   INDICATION: Symptomatic left sided pleural effusion  EXAM: US THORACENTESIS ASP PLEURAL SPACE W/IMG GUIDE  COMPARISON:  CXR 08/05/2015.  MEDICATIONS: None  COMPLICATIONS: None immediate  TECHNIQUE: Informed written  consent was obtained from the patient after a discussion of the risks, benefits and alternatives to treatment. A timeout was performed prior to the initiation of the procedure.  Initial ultrasound scanning demonstrates a left pleural effusion. The lower chest was prepped and draped in the usual sterile fashion. 1% lidocaine with epinephrine was used for local anesthesia.  Under direct ultrasound guidance, a 19 gauge, 10-cm, Yueh catheter was introduced. An ultrasound image was saved for documentation purposes. The thoracentesis was performed. The catheter was removed and a dressing was applied. The patient tolerated the procedure well without immediate post procedural complication. The patient was escorted to have an upright chest radiograph.  FINDINGS: A total of approximately 1 liter of bloody fluid was removed. Requested samples were sent to the laboratory.  IMPRESSION: Successful ultrasound-guided left sided thoracentesis yielding 1 liter of pleural fluid.  Read By:  Tsosie Billing PA-C   Electronically Signed   By:  Jacqulynn Cadet M.D.   On: 07/22/2015 10:34    Scheduled Meds: . aztreonam  1 g Intravenous 3 times per day  . carbidopa-levodopa  1 tablet Oral TID  . darifenacin  7.5 mg Oral Daily  . gabapentin  100 mg Oral Q1200  . gabapentin  200 mg Oral QHS  . insulin aspart  0-9 Units Subcutaneous Q4H  . insulin glargine  25 Units Subcutaneous Q2200  . levothyroxine  100 mcg Oral QAC breakfast  . metoprolol succinate  50 mg Oral Daily  . pravastatin  20 mg Oral q1800  . sertraline  25 mg Oral Daily  . tamsulosin  0.4 mg Oral Daily  . vancomycin  1,000 mg Intravenous Q24H  . [START ON 07/23/2015] Warfarin - Pharmacist Dosing Inpatient   Does not apply q1800   Continuous Infusions:   Principal Problem:   Acute respiratory failure with hypoxia Active Problems:   Hypothyroidism   Atrial fibrillation   Parkinson's disease   Long term current use of anticoagulant therapy   CKD (chronic  kidney disease) stage 4, GFR 15-29 ml/min   Generalized weakness   Diabetes mellitus, type 2   Diabetic neuropathy   Neidy Guerrieri, [facility] Pager [phone]. If 7PM-7AM, please contact night-coverage at [email or web address], password [facility] 07/22/2015, 4:17 PM  LOS: 1 day

## 2015-07-22 NOTE — Progress Notes (Signed)
Pt return from CT scan.

## 2015-07-22 NOTE — Clinical Documentation Improvement (Signed)
Internal Medicine Can the diagnosis of CHF be further specified?    Acuity - Acute, Chronic, Acute on Chronic   Type - Systolic, Diastolic, Systolic and Diastolic  Other  Clinically Undetermined  Document any associated diagnoses/conditions  Supporting Information: Current documentation reflects past medical history of "right-sided heart failure"; CXR notes "cardiomegaly and edema compatible with congestive heart failure".     Please exercise your independent, professional judgment when responding. A specific answer is not anticipated or expected.  Thank you, Mateo Flow, RN [phone] Clinical Documentation Specialist

## 2015-07-22 NOTE — Procedures (Signed)
Successful US guided left thoracentesis. Yielded 1 liter of bloody fluid. Pt tolerated procedure well. No immediate complications.  Specimen was sent for labs. CXR ordered.  Tsosie Billing D PA-C 07/22/2015 10:21 AM

## 2015-07-22 NOTE — ED Notes (Signed)
Pt transferred to ultrasound for thoracentesis.

## 2015-07-23 DIAGNOSIS — R0602 Shortness of breath: Secondary | ICD-10-CM

## 2015-07-23 LAB — CBC
HCT: 27.7 % — ABNORMAL LOW (ref 39.0–52.0)
Hemoglobin: 8.2 g/dL — ABNORMAL LOW (ref 13.0–17.0)
MCH: 26.7 pg (ref 26.0–34.0)
MCHC: 29.6 g/dL — ABNORMAL LOW (ref 30.0–36.0)
MCV: 90.2 fL (ref 78.0–100.0)
PLATELETS: 244 10*3/uL (ref 150–400)
RBC: 3.07 MIL/uL — AB (ref 4.22–5.81)
RDW: 16.2 % — ABNORMAL HIGH (ref 11.5–15.5)
WBC: 9.2 10*3/uL (ref 4.0–10.5)

## 2015-07-23 LAB — BASIC METABOLIC PANEL
ANION GAP: 9 (ref 5–15)
BUN: 89 mg/dL — ABNORMAL HIGH (ref 6–20)
CO2: 28 mmol/L (ref 22–32)
Calcium: 10.1 mg/dL (ref 8.9–10.3)
Chloride: 102 mmol/L (ref 101–111)
Creatinine, Ser: 2.78 mg/dL — ABNORMAL HIGH (ref 0.61–1.24)
GFR, EST AFRICAN AMERICAN: 23 mL/min — AB (ref >60)
GFR, EST NON AFRICAN AMERICAN: 20 mL/min — AB (ref >60)
GLUCOSE: 106 mg/dL — AB (ref 65–99)
POTASSIUM: 4.7 mmol/L (ref 3.5–5.1)
SODIUM: 139 mmol/L (ref 135–145)

## 2015-07-23 LAB — PROTIME-INR
INR: 4.21 — ABNORMAL HIGH (ref 0.00–1.49)
Prothrombin Time: 39.5 seconds — ABNORMAL HIGH (ref 11.6–15.2)

## 2015-07-23 LAB — GLUCOSE, CAPILLARY
GLUCOSE-CAPILLARY: 148 mg/dL — AB (ref 65–99)
GLUCOSE-CAPILLARY: 136 mg/dL — AB (ref 65–99)
Glucose-Capillary: 94 mg/dL (ref 65–99)
Glucose-Capillary: 87 mg/dL (ref 65–99)
Glucose-Capillary: 75 mg/dL (ref 65–99)

## 2015-07-23 MED ORDER — INSULIN ASPART 100 UNIT/ML ~~LOC~~ SOLN
0.0000 [IU] | SUBCUTANEOUS | Status: DC
  Administered 2015-07-23: 1 [IU] via SUBCUTANEOUS
  Administered 2015-07-24: 2 [IU] via SUBCUTANEOUS
  Administered 2015-07-24 (×2): 1 [IU] via SUBCUTANEOUS

## 2015-07-23 MED ORDER — MUPIROCIN 2 % EX OINT
1.0000 "application " | TOPICAL_OINTMENT | Freq: Two times a day (BID) | CUTANEOUS | Status: AC
  Administered 2015-07-23 – 2015-07-27 (×9): 1 via NASAL
  Filled 2015-07-23 (×2): qty 22

## 2015-07-23 MED ORDER — SODIUM CHLORIDE 0.9 % IV SOLN
INTRAVENOUS | Status: DC
  Administered 2015-07-23 – 2015-07-25 (×3): via INTRAVENOUS

## 2015-07-23 MED ORDER — CHLORHEXIDINE GLUCONATE CLOTH 2 % EX PADS
6.0000 | MEDICATED_PAD | Freq: Every day | CUTANEOUS | Status: AC
  Administered 2015-07-23 – 2015-07-27 (×5): 6 via TOPICAL

## 2015-07-23 MED ORDER — RESOURCE THICKENUP CLEAR PO POWD
ORAL | Status: DC | PRN
  Filled 2015-07-23 (×2): qty 125

## 2015-07-23 NOTE — Progress Notes (Signed)
[facility] PROGRESS NOTE  [name] [name]:[id number] DOB: May 01, 1931 DOA: 07/15/2015 PCP: Nyoka Cowden, MD  Assessment/Plan: 1. Acute respiratory failure with hypoxia 1. Imaging with worsening L sided pleural effusion with consolidation 2. Pt is continued on vancomycin and aztreonam 3. Pt is now s/p IR guided thoracentesis yielding 1L of fluid - exudative fluid per fluid analysis with cytology of fluid pos for reactive cells 4. Follow up chest CT with worsened effusion. Enlarged nodes on CT. Again, reactive cells on fluid cytology 5. Have consulted Pulmonary. Appreciate input. SLP evaluated per Pulm recs given concerns for aspiration. Pt has been cleared for dysphagia 3 diet with nectar thick liquids 6. Have discussed case with CT surgery. Pt is well known to Dr. Prescott Gum. Recs to transfer patient to [facility] for evaluation. Transfer orders placed. 2. Afib 1. Remains stable 2. Controlled. 3. Cont coumadin 3. Hypothyroid 1. Cont thyroid replacement as tolerated 4. CKD 1. Cr appears to be near baseline and stable overnight 2. Will cont to monitor closely and  5. Parkinson's disease 1. Stable 6. DM2 1. Cont SSI coverage 7. Questionable UTI 8. DVT prophylaxis 1. Full anticoagulation per above  Code Status: DNR Family Communication: Pt in room Disposition Plan: Pending   Consultants:  Pulmonary   CT Surgery  Procedures:    Antibiotics:  Vancomycin 8/16>>>  Aztreonam 8/16>>>   HPI/Subjective: Denies chest pain  Objective: Filed Vitals:   07/23/15 0550 07/23/15 0600 07/23/15 0720 07/23/15 0800  BP: 94/51 92/52 100/50   Pulse: 96 83 104   Temp:    97.4 F (36.3 C)  TempSrc:    Oral  Resp: 22 19 24    Height:      Weight:      SpO2: 96% 96% 97%     Intake/Output Summary (Last 24 hours) at 07/23/15 1034 Last data filed at 07/22/15 2259  Gross per 24 hour  Intake    250 ml  Output   1000 ml  Net   -750 ml   Filed Weights   07/19/2015 1654  07/23/15 0341  Weight: 99.338 kg (219 lb) 93.9 kg (207 lb 0.2 oz)    Exam:   General:  Awake, laying in bed, in nad   Cardiovascular: regular, s1, s2  Respiratory: mildly increased resp effort, no wheezing  Abdomen: soft, nondistended  Musculoskeletal: perfused, no clubbing   Data Reviewed: Basic Metabolic Panel:  Recent Labs Lab 07/16/2015 1758 07/22/15 0604 07/23/15 0353  NA 141 140 139  K 4.7 4.6 4.7  CL 104 103 102  CO2 30 30 28   GLUCOSE 134* 151* 106*  BUN 83* 84* 89*  CREATININE 2.75* 2.73* 2.78*  CALCIUM 10.5* 10.2 10.1   Liver Function Tests:  Recent Labs Lab 08/05/2015 1758 07/22/15 0604  AST 16 13*  ALT <5* 6*  ALKPHOS 230* 238*  BILITOT 0.4 0.5  PROT 7.4 7.5  ALBUMIN 2.9* 2.8*   No results for input(s): LIPASE, AMYLASE in the last 168 hours. No results for input(s): AMMONIA in the last 168 hours. CBC:  Recent Labs Lab 07/20/2015 1758 07/22/15 0604 07/23/15 0353  WBC 7.9 8.1 9.2  NEUTROABS 5.7 6.0  --   HGB 9.3* 9.4* 8.2*  HCT 31.4* 31.2* 27.7*  MCV 89.5 91.0 90.2  PLT 243 244 244   Cardiac Enzymes:  Recent Labs Lab 07/11/2015 1758  TROPONINI <0.03   BNP (last 3 results)  Recent Labs  11/29/14 0820 07/08/2015 1759  BNP 48.4 54.4    ProBNP (  last 3 results)  Recent Labs  11/12/14 0941  PROBNP 440.4    CBG:  Recent Labs Lab 07/22/15 1804 07/22/15 2002 07/22/15 2324 07/23/15 0354 07/23/15 0734  GLUCAP 128* 139* 154* 94 87    Recent Results (from the past 240 hour(s))  Gram stain     Status: None (Preliminary result)   Collection Time: 07/22/15 10:36 AM  Result Value Ref Range Status   Specimen Description FLUID PLEURAL  Final   Special Requests NONE  Final   Gram Stain   Final    MODERATE WBC PRESENT,BOTH PMN AND MONONUCLEAR NO ORGANISMS SEEN Performed at [facility]    Report Status PENDING  Incomplete  MRSA PCR Screening     Status: Abnormal   Collection Time: 07/22/15  3:17 PM  Result Value Ref  Range Status   MRSA by PCR POSITIVE (A) NEGATIVE Final    Comment:        The GeneXpert MRSA Assay (FDA approved for NASAL specimens only), is one component of a comprehensive MRSA colonization surveillance program. It is not intended to diagnose MRSA infection nor to guide or monitor treatment for MRSA infections. RESULT CALLED TO, READ BACK BY AND VERIFIED WITH: JESSICA CONRAD,RN [id number] @ [phone] BY J SCOTTON      Studies: Dg Chest 1 View  07/22/2015   CLINICAL DATA:  Left thoracentesis.  EXAM: CHEST  1 VIEW  COMPARISON:  Two-view chest x-ray 08/04/2015.  FINDINGS: A moderate size left pleural effusion persist. There is no pneumothorax. A small right pleural effusion is stable with some fluid extending into the minor fissure.  The left pleural effusion obscures the the heart. Atherosclerotic changes are noted at the aortic arch. Mild edema is present.  IMPRESSION: 1. Mild decrease in left pleural effusion without evidence for pneumothorax. 2. Cardiomegaly and edema compatible with congestive heart failure. 3. Atherosclerosis.   Electronically Signed   By: San Morelle M.D.   On: 07/22/2015 11:20   Dg Chest 2 View  08/03/2015   CLINICAL DATA:  Patient diagnosed with left lower lobe pneumonia approximately 1 month ago, presenting with acute onset of generalized weakness and shortness of breath when lying in the supine position. CT  EXAM: CHEST  2 VIEW  COMPARISON:  07/08/2015 and earlier, including CT chest 06/24/2015.  FINDINGS: AP semi-erect and lateral images were obtained. Cardiac silhouette moderately enlarged, unchanged. Dense consolidation in the left lower lobe, unchanged dating back to 06/23/2015. Interval significant increase in size of the now very large left pleural effusion since the most recent examination 2 weeks ago. Stable small right pleural effusion and chronic linear scarring in the right lower lobe. Pulmonary venous hypertension, worse than the examination 2 weeks ago,  without overt edema. Degenerative changes again noted throughout the thoracic spine.  IMPRESSION: 1. Interval significant increase in size of a now very large left pleural effusion since the most recent examination 2 weeks ago. 2. Dense consolidation in the left lower lobe, unchanged dating back to 06/23/2015, likely representing persistent pneumonia. 3. Stable small right pleural effusion. Stable chronic linear scarring in the right lower lobe. 4. Stable moderate cardiomegaly. Pulmonary venous hypertension, worse than the examination 2 days ago, without overt edema.   Electronically Signed   By: Evangeline Dakin M.D.   On: 07/19/2015 18:35   Ct Chest Wo Contrast  07/22/2015   CLINICAL DATA:  Pleural effusion, shortness of breath, and atrial fibrillation.  EXAM: CT CHEST WITHOUT CONTRAST  TECHNIQUE: Multidetector CT imaging  of the chest was performed following the standard protocol without IV contrast.  COMPARISON:  Chest x-ray dated 07/22/2015 and chest CT dated 06/23/2005  FINDINGS: The left pleural effusion has markedly increased. There is complete atelectasis of the left lower lobe. There is compressive atelectasis of much of the left upper lobe.  There is and moderate right pleural effusion, slightly increased. The patient has multiple right pulmonary nodules including a 11 mm nodule in the medial aspect of the right upper lobe with 2 adjacent 3 mm nodules all seen on image 25. There is a poorly defined 6 mm nodule in the right middle lobe on image 34 with a 12 mm nodule in the right lower lobe on the same image. There is a 13 mm nodule in the right lower lobe adjacent to the major fissure.  There is slight atelectasis at the right lung base with some benign calcifications in the periphery of the right lower lobe. There is chronic cardiomegaly with a small pleural effusion. There is a slightly enlarged superior mat mediastinal lymph node between the right innominate vein and the anterior aspect of the aortic  arch on image number 25 measuring 12 mm in diameter. There is a benign precarinal lymph node with a fatty hilum. No definitive hilar adenopathy. No pulmonary nodules noted on the left.  There is chronic accentuation of the thoracic kyphosis. The visualized portion of the upper abdomen is normal.  IMPRESSION: 1. Large left pleural effusion with compressive atelectasis of the left lower lobe and much of the left upper lobe. 2. Slight increased moderate right pleural effusion. 3. Multiple poorly defined pulmonary nodules in the right lung which are worrisome for metastatic disease. These were not present chest CT dated 01/14/2015. 4. Single enlarged lymph node in the superior mediastinum which could represent metastasis. 5. Chronic cardiomegaly with a small pericardial effusion.   Electronically Signed   By: Lorriane Shire M.D.   On: 07/22/2015 18:14   US Thoracentesis Asp Pleural Space W/img Guide  07/22/2015   INDICATION: Symptomatic left sided pleural effusion  EXAM: US THORACENTESIS ASP PLEURAL SPACE W/IMG GUIDE  COMPARISON:  CXR 08/03/2015.  MEDICATIONS: None  COMPLICATIONS: None immediate  TECHNIQUE: Informed written consent was obtained from the patient after a discussion of the risks, benefits and alternatives to treatment. A timeout was performed prior to the initiation of the procedure.  Initial ultrasound scanning demonstrates a left pleural effusion. The lower chest was prepped and draped in the usual sterile fashion. 1% lidocaine with epinephrine was used for local anesthesia.  Under direct ultrasound guidance, a 19 gauge, 10-cm, Yueh catheter was introduced. An ultrasound image was saved for documentation purposes. The thoracentesis was performed. The catheter was removed and a dressing was applied. The patient tolerated the procedure well without immediate post procedural complication. The patient was escorted to have an upright chest radiograph.  FINDINGS: A total of approximately 1 liter of bloody  fluid was removed. Requested samples were sent to the laboratory.  IMPRESSION: Successful ultrasound-guided left sided thoracentesis yielding 1 liter of pleural fluid.  Read By:  Tsosie Billing PA-C   Electronically Signed   By: Jacqulynn Cadet M.D.   On: 07/22/2015 10:34    Scheduled Meds: . aztreonam  1 g Intravenous 3 times per day  . carbidopa-levodopa  1 tablet Oral TID  . Chlorhexidine Gluconate Cloth  6 each Topical Q0600  . darifenacin  7.5 mg Oral Daily  . gabapentin  100 mg Oral Q1200  .  gabapentin  200 mg Oral QHS  . insulin aspart  0-9 Units Subcutaneous 6 times per day  . insulin glargine  25 Units Subcutaneous Q2200  . levothyroxine  100 mcg Oral QAC breakfast  . metoprolol succinate  50 mg Oral Daily  . mupirocin ointment  1 application Nasal BID  . pravastatin  20 mg Oral q1800  . sertraline  25 mg Oral Daily  . tamsulosin  0.4 mg Oral Daily  . vancomycin  1,000 mg Intravenous Q24H  . Warfarin - Pharmacist Dosing Inpatient   Does not apply q1800   Continuous Infusions:   Principal Problem:   Acute respiratory failure with hypoxia Active Problems:   Hypothyroidism   B12 deficiency   Atrial fibrillation   Obstructive sleep apnea   Parkinson's disease   Long term current use of anticoagulant therapy   CKD (chronic kidney disease) stage 4, GFR 15-29 ml/min   Pleural effusion   Shortness of breath   Recurrent right pleural effusion   Generalized weakness   Diabetes mellitus, type 2   Diabetic neuropathy   Cydnie Deason, [facility] Pager [phone]. If 7PM-7AM, please contact night-coverage at [email or web address], password [facility] 07/23/2015, 10:34 AM  LOS: 2 days

## 2015-07-23 NOTE — Care Management Important Message (Signed)
Important Message  Patient Details  Name: MONTEZ CUDA MRN: [id number] Date of Birth: September 21, 1931   Medicare Important Message Given:  Yes-second notification given    Camillo Flaming 07/23/2015, 12:37 [place] Message  Patient Details  Name: CANDIDO FLOTT MRN: [id number] Date of Birth: [birth year]   Medicare Important Message Given:  Yes-second notification given    Camillo Flaming 07/23/2015, 12:37 PM

## 2015-07-23 NOTE — Progress Notes (Signed)
SLP phoned CVTS office and spoke to Rn who reports pt does not have to be npo for pleurix catheter placement.  Concern for aspiration of thin present as pt with overt symptoms and reports coughing with thin x several mos.  Good tolerance of nectar and solids - poor dentition.   Recommend dys3/nectar and MBS next date. Luanna Salk, [facility] SLP [phone]

## 2015-07-23 NOTE — Progress Notes (Addendum)
ANTICOAGULATION CONSULT NOTE - Initial Consult  Pharmacy Consult for Coumadin Indication: atrial fibrillation  Allergies  Allergen Reactions  . Penicillins Rash    Happened 60 years ago; Tolerates Cephs    Patient Measurements: Height: 6\' 1"  (185.4 cm) Weight: 207 lb 0.2 oz (93.9 kg) IBW/kg (Calculated) : 79.9  Vital Signs: Temp: 98.1 F (36.7 C) (08/18 0341) Temp Source: Oral (08/18 0341) BP: 100/50 mmHg (08/18 0720) Pulse Rate: 104 (08/18 0720)  Labs:  Recent Labs  07/14/2015 1758 07/22/15 0508 07/22/15 0604 07/23/15 0353  HGB 9.3*  --  9.4* 8.2*  HCT 31.4*  --  31.2* 27.7*  PLT 243  --  244 244  LABPROT 32.1* 33.4*  --  39.5*  INR 3.19* 3.37*  --  4.21*  CREATININE 2.75*  --  2.73* 2.78*  TROPONINI <0.03  --   --   --     Estimated Creatinine Clearance: 23.2 mL/min (by C-G formula based on Cr of 2.78).   Medical History: Past Medical History  Diagnosis Date  . Family history of malignant neoplasm of gastrointestinal tract   . Unspecified constipation   . Abnormal involuntary movements(781.0)   . Vitamin B12 deficiency   . Claudication   . Hypertrophy of prostate with urinary obstruction and other lower urinary tract symptoms (LUTS)   . Obesity   . Mixed hyperlipidemia   . Atrial fibrillation     --myoview 07/2006: not gated. No ischemia or infarct  Diastolic HF-- Echo 0/7371 ER60% mild AI/MR. RV mild to moderately dilated/ dysfunction. ? restrictive CM . RVSP 36  . Bladder polyps   . Kidney stones   . Unspecified sleep apnea     NPSG 05/14/2007- AHI 80.7/ hr  . OSA (obstructive sleep apnea)     "tried mask; couldn't use it; so I quit" (11/12/2014)  . Hypothyroidism   . History of hiatal hernia   . Daily headache     "I've had headaches all my life; get them almost daily" (11/12/2014)  . Stroke ~ 1985    "simple stroke" denies residual on 11/12/2014  . Arthritis     "just about qwhere"   . Pleural effusion 11/12/2014 admission  . Dysrhythmia    Afib  . Type II or unspecified type diabetes mellitus with neurological manifestations, not stated as uncontrolled   . Shortness of breath dyspnea     walking distance or climbing stairs  . Neuromuscular disorder     Parkinson's / age 43 had growth per left shoulder/neck area that stopped blood flow to left arm was removed and has not no further problems.    Medications:  Coumadin 5mg  po daily except 2.5mg  on MWF per outpatient [facility] clinic note on 8/9 but wife said he's been taking 5mg  daily except 2.5mg  on Sun, Tues, and Fri  Assessment: 81 yo M admitted with HCAP on chronic Coumadin for Afib.  INR elevated on admission (3.19).  Last dose taken PTA 8/15.  Per outpatient flow sheet patient has been on current regimen for several months with mostly therapeutic INRs.  At most recent visit (07/14/15) INR was 3.5.  Dose was held x1 day.     Significant events: - 8/17: L thoracentesis for pleural effusion  Today, 07/23/2015:  INR up 4.21 despite dose held on 8/18  hgb down slightly to 8.2, plt ok  No bleeding documented  Goal of Therapy:  INR 2-3   Plan:  - continue to hold Coumadin today - f/u with labs in AM  and resume coumadin if/when appropriate - monitor for s/s bleeding  Nirvaan Frett P 07/23/2015,8:29 AM

## 2015-07-23 NOTE — Evaluation (Signed)
Clinical/Bedside Swallow Evaluation Patient Details  Name: [name] [name] MRN: [id number] Date of Birth: Jul 18, 1931  Today's Date: 07/23/2015 Time: SLP Start Time (ACUTE ONLY): 1150 SLP Stop Time (ACUTE ONLY): 1227 SLP Time Calculation (min) (ACUTE ONLY): 37 min  Past Medical History:  Past Medical History  Diagnosis Date  . Family history of malignant neoplasm of gastrointestinal tract   . Unspecified constipation   . Abnormal involuntary movements(781.0)   . Vitamin B12 deficiency   . Claudication   . Hypertrophy of prostate with urinary obstruction and other lower urinary tract symptoms (LUTS)   . Obesity   . Mixed hyperlipidemia   . Atrial fibrillation     --myoview 07/2006: not gated. No ischemia or infarct  Diastolic HF-- Echo 12/8839 ER60% mild AI/MR. RV mild to moderately dilated/ dysfunction. ? restrictive CM . RVSP 36  . Bladder polyps   . Kidney stones   . Unspecified sleep apnea     NPSG 05/14/2007- AHI 80.7/ hr  . OSA (obstructive sleep apnea)     "tried mask; couldn't use it; so I quit" (11/12/2014)  . Hypothyroidism   . History of hiatal hernia   . Daily headache     "I've had headaches all my life; get them almost daily" (11/12/2014)  . Stroke ~ 1985    "simple stroke" denies residual on 11/12/2014  . Arthritis     "just about qwhere"   . Pleural effusion 11/12/2014 admission  . Dysrhythmia     Afib  . Type II or unspecified type diabetes mellitus with neurological manifestations, not stated as uncontrolled   . Shortness of breath dyspnea     walking distance or climbing stairs  . Neuromuscular disorder     Parkinson's / age 71 had growth per left shoulder/neck area that stopped blood flow to left arm was removed and has not no further problems.   Past Surgical History:  Past Surgical History  Procedure Laterality Date  . Appendectomy    . Kidney stone surgery      Kidney stone retrieval with uretal stent x 2   . Bladder polyps      many times  last time  12/15  . Transurethral resection of bladder    . Orif elbow fracture Left 1976  . Fracture surgery    . Excisional hemorrhoidectomy  1950's  . Ureteral stent placement  X 1  . Cystoscopy w/ stone manipulation  X 2  . Cataract extraction w/ intraocular lens  implant, bilateral Bilateral   . Eye surgery Bilateral     catarct  . Chest tube insertion Right 12/12/2014    Procedure: INSERTION PLEURAL DRAINAGE CATHETER;  Surgeon: Ivin Poot, MD;  Location: [place];  Service: Thoracic;  Laterality: Right;  . Colonscopy       removal of polyps  . Cystoscopy N/A 01/15/2015    Procedure: CYSTOSCOPY;  Surgeon: Bernestine Amass, MD;  Location: [facility];  Service: Urology;  Laterality: N/A;  . Transurethral resection of bladder tumor N/A 01/15/2015    Procedure: TRANSURETHRAL RESECTION OF BLADDER TUMOR (TURBT);  Surgeon: Bernestine Amass, MD;  Location: [facility];  Service: Urology;  Laterality: N/A;  . Removal of pleural drainage catheter Right 02/23/2015    Procedure: REMOVAL OF PLEURAL DRAINAGE CATHETER;  Surgeon: Ivin Poot, MD;  [facility] OR;  Service: Thoracic;  Laterality: Right;   HPI:  81 yo male adm to [facility] with acute respiratory failure with hypoxia.  Pt PMH + for  Parkinson's disease, neuropathy, SOB, B12 deficiency, CKD, CVA, hiatal hernia.  Pt also with recurrent pleural effusions - required pleurix catheter 1/16-3/16.   Seen in June 2016 for BSE with recommendationfor regular/thin diet-  coughing some with/without intake noted.  Swallow evaluation ordered currently.    Assessment / Plan / Recommendation Clinical Impression  Pt presents with symptoms of aspiration with thin present characterized by immediate and delayed cough s/p swallow of thin liquid via tsp only.  Decreased laryngeal elevation viewed at bedside.  Pt self reports coughing with thin liquids x several months.  Great tolerance of nectar, puree and solids noted.  Pt masticates thoroughly and eats slowly.  Pt admits to improved  tolerance of nectar over thin.   Given his respiratory difficulty, recommend to advance to dys3 for energy conservation with nectar thick liquids.     Pt agreeable to participate in instrumental swallow evaluation.  SLP explained clinical reasoning including recurrent pleural effusions, liquid dysphagia, prior CVA and Parkinson's disease.  He verbalized preference for MBS after SlP explained process of FEES and MBS.   If [facility] is to [facility] to [facility] today, MBS can be completed next date.  Using teach back, educated pt to findings.  Written precautions left for RN to post.  SLP to follow for dysphagia management- thanks for this consult.     Aspiration Risk    Moderate   Diet Recommendation Dysphagia 3 (Mech soft);Nectar   Medication Administration: Whole meds with puree    Other  Recommendations Oral Care Recommendations: Oral care BID Other Recommendations: Order thickener from pharmacy;Clarify dietary restrictions   Follow Up Recommendations       Frequency and Duration min 2x/week  2 weeks   Pertinent Vitals/Pain Afebrile, decreased      Swallow Study Prior Functional Status   see HHX    General Date of Onset: 07/23/15 Other Pertinent Information: 81 yo male adm to [facility] with acute respiratory failure with hypoxia.  Pt PMH + for Parkinson's disease, neuropathy, SOB, B12 deficiency, CKD.  Pt also with recurrent pleural effusions - required pleurix catheter 1/16-3/16.   Seen in June 2016 for BSE with recommendationfor regular/thin diet-  coughing some with/without intake noted.  Swallow evaluation ordered currently.  Type of Study: Bedside swallow evaluation Diet Prior to this Study: NPO Temperature Spikes Noted: No Respiratory Status: Supplemental O2 delivered via (comment) History of Recent Intubation: No Behavior/Cognition: Alert;Cooperative;Pleasant mood Oral Cavity - Dentition: Adequate natural dentition/normal for age Self-Feeding Abilities: Needs assist;Needs set up Patient  Positioning: Upright in bed Baseline Vocal Quality: Low vocal intensity Volitional Cough: Strong Volitional Swallow: Able to elicit    Oral/Motor/Sensory Function Overall Oral Motor/Sensory Function: Appears within functional limits for tasks assessed   Ice Chips Ice chips: Impaired Presentation: Spoon Pharyngeal Phase Impairments: Cough - Delayed;Decreased hyoid-laryngeal movement   Thin Liquid Thin Liquid: Impaired Presentation: Spoon Pharyngeal  Phase Impairments: Cough - Immediate;Cough - Delayed;Decreased hyoid-laryngeal movement    Nectar Thick Nectar Thick Liquid: Within functional limits Presentation: Cup;Self Fed;Straw;Spoon   Honey Thick Honey Thick Liquid: Not tested   Puree Puree: Within functional limits Presentation: Self Fed;Spoon   Solid   GO    Solid: Within functional limits Presentation: Self Fed Other Comments: pt masticates thoroughly       Luanna Salk, [place] [facility] SLP [phone]

## 2015-07-23 NOTE — Consult Note (Signed)
Name: [name] [name] MRN: [id number] DOB: 12-31-1930    ADMISSION DATE:  07/22/2015 CONSULTATION DATE:8/18  REFERRING MD :  [facility]  CHIEF COMPLAINT:  SOB  BRIEF PATIENT DESCRIPTION: Frail 81 yo  SIGNIFICANT EVENTS    STUDIES:  8/17 left thoracentesis 1 litre bloody 8/18 swallow eval>>  HISTORY OF PRESENT ILLNESS:   Acute on chronic resp distress with underlying OSA(followed by Dr, Annamaria Boots) recurrent non malignant pleural effusions with right pleurex cath per CVTS([facility]) 1/16 with removal 3/16 and with new left >right effusion post thora 8/17 1 litre bloody fluid. Note he is on coumadin for Afib. He has underlying parkinson dz, DM2 and general FTT. He returns with increased sob and PCCM asked to evaluate. CVTS has been called to evaluate also. Cannot find where swallow evaluation has been done in the past and will order one.    PAST MEDICAL HISTORY :   has a past medical history of Family history of malignant neoplasm of gastrointestinal tract; Unspecified constipation; Abnormal involuntary movements(781.0); Vitamin B12 deficiency; Claudication; Hypertrophy of prostate with urinary obstruction and other lower urinary tract symptoms (LUTS); Obesity; Mixed hyperlipidemia; Atrial fibrillation; Bladder polyps; Kidney stones; Unspecified sleep apnea; OSA (obstructive sleep apnea); Hypothyroidism; History of hiatal hernia; Daily headache; Stroke (~ 1985); Arthritis; Pleural effusion (11/12/2014 admission); Dysrhythmia; Type II or unspecified type diabetes mellitus with neurological manifestations, not stated as uncontrolled; Shortness of breath dyspnea; and Neuromuscular disorder.  has past surgical history that includes Appendectomy; Kidney stone surgery; bladder polyps; Transurethral resection of bladder; ORIF elbow fracture (Left, 1976); Fracture surgery; Excisional hemorrhoidectomy (1950's); Ureteral stent placement (X 1); Cystoscopy w/ stone manipulation (X 2); Cataract extraction w/  intraocular lens  implant, bilateral (Bilateral); Eye surgery (Bilateral); Chest tube insertion (Right, 12/12/2014); colonscopy ; Cystoscopy (N/A, 01/15/2015); Transurethral resection of bladder tumor (N/A, 01/15/2015); and Removal of pleural drainage catheter (Right, 02/23/2015). Prior to Admission medications   Medication Sig Start Date End Date Taking? Authorizing Provider  acetaminophen (TYLENOL) 500 MG tablet Take 500 mg by mouth every 6 (six) hours as needed for mild pain.   Yes Historical Provider, MD  carbidopa-levodopa (SINEMET IR) 25-100 MG per tablet Take 1 tablet by mouth 3 (three) times daily. 01/07/15  Yes Donika K Patel, DO  Cyanocobalamin (VITAMIN B 12 PO) Take 1 tablet by mouth daily.   Yes Historical Provider, MD  diazepam (VALIUM) 2 MG tablet Take 2 mg by mouth every 6 (six) hours as needed for anxiety.   Yes Historical Provider, MD  furosemide (LASIX) 20 MG tablet 1 tablet Monday, Wednesday, Friday only Patient taking differently: Take 20 mg by mouth every Monday, Wednesday, and Friday. 1 tablet Monday, Wednesday, Friday only 06/24/14  Yes Marletta Lor, MD  gabapentin (NEURONTIN) 100 MG capsule TAKE 2 CAPSULES BY MOUTH EACH EVENING AND ONE CAPSULE AT BEDTIME Patient taking differently: TAKE 2 CAPSULES BY MOUTH at night and  ONE CAPSULE AT noon 06/17/15  Yes Marletta Lor, MD  Insulin Glargine (LANTUS SOLOSTAR) 100 UNIT/ML Solostar Pen Inject 50 Units into the skin daily at 10 pm. 06/26/15  Yes Reyne Dumas, MD  levothyroxine (SYNTHROID, LEVOTHROID) 100 MCG tablet TAKE 1 TABLET BY MOUTH ONCE DAILY 05/27/15  Yes Marletta Lor, MD  lovastatin (MEVACOR) 20 MG tablet TAKE 1 TABLET BY MOUTH DAILY 04/24/15  Yes Marletta Lor, MD  metoprolol succinate (TOPROL-XL) 100 MG 24 hr tablet TAKE 1/2 TABLET BY MOUTH DAILY 07/04/15  Yes Marletta Lor, MD  Polyethyl Glycol-Propyl Glycol 0.4-0.3 %  SOLN Apply 1 drop to eye daily as needed (dry eye).   Yes Historical Provider, MD    polyethylene glycol (MIRALAX) powder Take 17 g by mouth at bedtime. For regularity   Yes Historical Provider, MD  sertraline (ZOLOFT) 25 MG tablet Take 1 tablet (25 mg total) by mouth daily. 12/30/14  Yes Marletta Lor, MD  Tamsulosin HCl (FLOMAX) 0.4 MG CAPS Take 0.4 mg by mouth daily.    Yes Historical Provider, MD  traMADol (ULTRAM) 50 MG tablet Take 50 mg by mouth every 6 (six) hours as needed for moderate pain or severe pain.   Yes Historical Provider, MD  trospium (SANCTURA) 20 MG tablet Take 20 mg by mouth daily.   Yes Historical Provider, MD  warfarin (COUMADIN) 5 MG tablet Take 0.5-1 tablets (2.5-5 mg total) by mouth daily at 6 PM. TAKE 2.5mg  on Sun/Tues/Fri's and Take 5 mg on all other days 06/30/15  Yes Marletta Lor, MD   Allergies  Allergen Reactions  . Penicillins Rash    Happened 60 years ago; Tolerates Cephs    FAMILY HISTORY:  family history includes Colon cancer in his sister; Healthy in his daughter and son; Hip fracture in his father; Lung cancer (age of onset: 26) in his mother; Prostate cancer in his paternal uncle; Stroke in his father. SOCIAL HISTORY:  reports that he quit smoking about 29 years ago. His smoking use included Cigarettes. He has a 35 pack-year smoking history. He has never used smokeless tobacco. He reports that he does not drink alcohol or use illicit drugs.  REVIEW OF SYSTEMS:   10 point review of system taken, please see HPI for positives and negatives.  SUBJECTIVE: NAD  VITAL SIGNS: Temp:  [97.4 F (36.3 C)-98.3 F (36.8 C)] 97.4 F (36.3 C) (08/18 0800) Pulse Rate:  [48-117] 104 (08/18 0720) Resp:  [14-24] 24 (08/18 0720) BP: (88-136)/(45-115) 100/50 mmHg (08/18 0720) SpO2:  [96 %-100 %] 97 % (08/18 0720) Weight:  [207 lb 0.2 oz (93.9 kg)] 207 lb 0.2 oz (93.9 kg) (08/18 0341)  PHYSICAL EXAMINATION: General:  Frail wasted wm in NAD Neuro: Follows commands, dull affect HEENT:  Oral mucosa moist, no JVD/LAN Cardiovascular:   HSD Lungs: Decreased bs bases l>R Abdomen:  + bs Musculoskeletal:  intact Skin: warm + le edema   Recent Labs Lab 07/22/2015 1758 07/22/15 0604 07/23/15 0353  NA 141 140 139  K 4.7 4.6 4.7  CL 104 103 102  CO2 30 30 28   BUN 83* 84* 89*  CREATININE 2.75* 2.73* 2.78*  GLUCOSE 134* 151* 106*    Recent Labs Lab 07/10/2015 1758 07/22/15 0604 07/23/15 0353  HGB 9.3* 9.4* 8.2*  HCT 31.4* 31.2* 27.7*  WBC 7.9 8.1 9.2  PLT 243 244 244   Dg Chest 1 View  07/22/2015   CLINICAL DATA:  Left thoracentesis.  EXAM: CHEST  1 VIEW  COMPARISON:  Two-view chest x-ray 07/13/2015.  FINDINGS: A moderate size left pleural effusion persist. There is no pneumothorax. A small right pleural effusion is stable with some fluid extending into the Carrol Bondar fissure.  The left pleural effusion obscures the the heart. Atherosclerotic changes are noted at the aortic arch. Mild edema is present.  IMPRESSION: 1. Mild decrease in left pleural effusion without evidence for pneumothorax. 2. Cardiomegaly and edema compatible with congestive heart failure. 3. Atherosclerosis.   Electronically Signed   By: San Morelle M.D.   On: 07/22/2015 11:20   Dg Chest 2 View  07/10/2015  CLINICAL DATA:  Patient diagnosed with left lower lobe pneumonia approximately 1 month ago, presenting with acute onset of generalized weakness and shortness of breath when lying in the supine position. CT  EXAM: CHEST  2 VIEW  COMPARISON:  07/08/2015 and earlier, including CT chest 06/24/2015.  FINDINGS: AP semi-erect and lateral images were obtained. Cardiac silhouette moderately enlarged, unchanged. Dense consolidation in the left lower lobe, unchanged dating back to 06/23/2015. Interval significant increase in size of the now very large left pleural effusion since the most recent examination 2 weeks ago. Stable small right pleural effusion and chronic linear scarring in the right lower lobe. Pulmonary venous hypertension, worse than the  examination 2 weeks ago, without overt edema. Degenerative changes again noted throughout the thoracic spine.  IMPRESSION: 1. Interval significant increase in size of a now very large left pleural effusion since the most recent examination 2 weeks ago. 2. Dense consolidation in the left lower lobe, unchanged dating back to 06/23/2015, likely representing persistent pneumonia. 3. Stable small right pleural effusion. Stable chronic linear scarring in the right lower lobe. 4. Stable moderate cardiomegaly. Pulmonary venous hypertension, worse than the examination 2 days ago, without overt edema.   Electronically Signed   By: Evangeline Dakin M.D.   On: 07/28/2015 18:35   Ct Chest Wo Contrast  07/22/2015   CLINICAL DATA:  Pleural effusion, shortness of breath, and atrial fibrillation.  EXAM: CT CHEST WITHOUT CONTRAST  TECHNIQUE: Multidetector CT imaging of the chest was performed following the standard protocol without IV contrast.  COMPARISON:  Chest x-ray dated 07/22/2015 and chest CT dated 06/23/2005  FINDINGS: The left pleural effusion has markedly increased. There is complete atelectasis of the left lower lobe. There is compressive atelectasis of much of the left upper lobe.  There is and moderate right pleural effusion, slightly increased. The patient has multiple right pulmonary nodules including a 11 mm nodule in the medial aspect of the right upper lobe with 2 adjacent 3 mm nodules all seen on image 25. There is a poorly defined 6 mm nodule in the right middle lobe on image 34 with a 12 mm nodule in the right lower lobe on the same image. There is a 13 mm nodule in the right lower lobe adjacent to the major fissure.  There is slight atelectasis at the right lung base with some benign calcifications in the periphery of the right lower lobe. There is chronic cardiomegaly with a small pleural effusion. There is a slightly enlarged superior mat mediastinal lymph node between the right innominate vein and the  anterior aspect of the aortic arch on image number 25 measuring 12 mm in diameter. There is a benign precarinal lymph node with a fatty hilum. No definitive hilar adenopathy. No pulmonary nodules noted on the left.  There is chronic accentuation of the thoracic kyphosis. The visualized portion of the upper abdomen is normal.  IMPRESSION: 1. Large left pleural effusion with compressive atelectasis of the left lower lobe and much of the left upper lobe. 2. Slight increased moderate right pleural effusion. 3. Multiple poorly defined pulmonary nodules in the right lung which are worrisome for metastatic disease. These were not present chest CT dated 01/14/2015. 4. Single enlarged lymph node in the superior mediastinum which could represent metastasis. 5. Chronic cardiomegaly with a small pericardial effusion.   Electronically Signed   By: Lorriane Shire M.D.   On: 07/22/2015 18:14   US Thoracentesis Asp Pleural Space W/img Guide  07/22/2015  INDICATION: Symptomatic left sided pleural effusion  EXAM: US THORACENTESIS ASP PLEURAL SPACE W/IMG GUIDE  COMPARISON:  CXR 07/14/2015.  MEDICATIONS: None  COMPLICATIONS: None immediate  TECHNIQUE: Informed written consent was obtained from the patient after a discussion of the risks, benefits and alternatives to treatment. A timeout was performed prior to the initiation of the procedure.  Initial ultrasound scanning demonstrates a left pleural effusion. The lower chest was prepped and draped in the usual sterile fashion. 1% lidocaine with epinephrine was used for local anesthesia.  Under direct ultrasound guidance, a 19 gauge, 10-cm, Yueh catheter was introduced. An ultrasound image was saved for documentation purposes. The thoracentesis was performed. The catheter was removed and a dressing was applied. The patient tolerated the procedure well without immediate post procedural complication. The patient was escorted to have an upright chest radiograph.  FINDINGS: A total of  approximately 1 liter of bloody fluid was removed. Requested samples were sent to the laboratory.  IMPRESSION: Successful ultrasound-guided left sided thoracentesis yielding 1 liter of pleural fluid.  Read By:  Tsosie Billing PA-C   Electronically Signed   By: Jacqulynn Cadet M.D.   On: 07/22/2015 10:34   Principal Problem:   Acute respiratory failure with hypoxia Active Problems:   Hypothyroidism   B12 deficiency   Atrial fibrillation   Obstructive sleep apnea   Parkinson's disease   Long term current use of anticoagulant therapy   CKD (chronic kidney disease) stage 4, GFR 15-29 ml/min   Pleural effusion bilateral with hx of long term catheter right and left with recent thoracentesis 8/17   Shortness of breath   Recurrent right pleural effusion   Generalized weakness   Diabetes mellitus, type 2   Diabetic neuropathy   ASSESSMENT   Acute on chronic resp distress with underlying OSA(followed by Dr, Annamaria Boots) recurrent non malignant pleural effusions with right pleurex cath per CVTS([facility]) 1/16 with removal 3/16 and with new left >right effusion post thora 8/17 1 litre bloody fluid. Note he is on coumadin for Afib. He has underlying parkinson dz, DM2 and general FTT. He returns with increased sob and PCCM asked to evaluate. CVTS has been called to evaluate also. Cannot find where swallow evaluation has been done in the past and will order one.    Acute on chronic resp failure OSA Afib Parkinson   PLAN: Follow effusion labs Empiric abx per [facility] CVTS eval Hold coumadin for possible repeat pleurex cath on left Swallow eval to rule out aspiration as cause. Ordered 8/18  Richardson Landry Sharday Michl ACNP Maryanna Shape PCCM Pager [phone] till 3 pm If no answer page [phone] 07/23/2015, 9:53 AM

## 2015-07-24 ENCOUNTER — Inpatient Hospital Stay (HOSPITAL_COMMUNITY): Payer: Medicare Other

## 2015-07-24 DIAGNOSIS — I482 Chronic atrial fibrillation: Secondary | ICD-10-CM

## 2015-07-24 DIAGNOSIS — J90 Pleural effusion, not elsewhere classified: Secondary | ICD-10-CM

## 2015-07-24 LAB — GLUCOSE, CAPILLARY
GLUCOSE-CAPILLARY: 136 mg/dL — AB (ref 65–99)
GLUCOSE-CAPILLARY: 101 mg/dL — AB (ref 65–99)
GLUCOSE-CAPILLARY: 145 mg/dL — AB (ref 65–99)
Glucose-Capillary: 134 mg/dL — ABNORMAL HIGH (ref 65–99)
Glucose-Capillary: 163 mg/dL — ABNORMAL HIGH (ref 65–99)
Glucose-Capillary: 187 mg/dL — ABNORMAL HIGH (ref 65–99)

## 2015-07-24 LAB — CBC
HCT: 29 % — ABNORMAL LOW (ref 39.0–52.0)
Hemoglobin: 8.8 g/dL — ABNORMAL LOW (ref 13.0–17.0)
MCH: 27.2 pg (ref 26.0–34.0)
MCHC: 30.3 g/dL (ref 30.0–36.0)
MCV: 89.8 fL (ref 78.0–100.0)
PLATELETS: 227 10*3/uL (ref 150–400)
RBC: 3.23 MIL/uL — AB (ref 4.22–5.81)
RDW: 16 % — AB (ref 11.5–15.5)
WBC: 7 10*3/uL (ref 4.0–10.5)

## 2015-07-24 LAB — BASIC METABOLIC PANEL
Anion gap: 8 (ref 5–15)
BUN: 77 mg/dL — AB (ref 6–20)
CO2: 28 mmol/L (ref 22–32)
Calcium: 9.5 mg/dL (ref 8.9–10.3)
Chloride: 104 mmol/L (ref 101–111)
Creatinine, Ser: 2.69 mg/dL — ABNORMAL HIGH (ref 0.61–1.24)
GFR calc Af Amer: 24 mL/min — ABNORMAL LOW (ref >60)
GFR, EST NON AFRICAN AMERICAN: 21 mL/min — AB (ref >60)
GLUCOSE: 165 mg/dL — AB (ref 65–99)
POTASSIUM: 4.3 mmol/L (ref 3.5–5.1)
Sodium: 140 mmol/L (ref 135–145)

## 2015-07-24 LAB — PROTIME-INR
INR: 4.07 — ABNORMAL HIGH (ref 0.00–1.49)
Prothrombin Time: 38.5 seconds — ABNORMAL HIGH (ref 11.6–15.2)

## 2015-07-24 MED ORDER — INSULIN ASPART 100 UNIT/ML ~~LOC~~ SOLN
0.0000 [IU] | Freq: Three times a day (TID) | SUBCUTANEOUS | Status: DC
  Administered 2015-07-25: 1 [IU] via SUBCUTANEOUS
  Administered 2015-07-25 (×2): 3 [IU] via SUBCUTANEOUS
  Administered 2015-07-26 (×2): 1 [IU] via SUBCUTANEOUS
  Administered 2015-07-26 – 2015-07-27 (×2): 2 [IU] via SUBCUTANEOUS
  Administered 2015-07-28: 1 [IU] via SUBCUTANEOUS
  Administered 2015-07-28: 2 [IU] via SUBCUTANEOUS
  Administered 2015-07-28: 1 [IU] via SUBCUTANEOUS

## 2015-07-24 MED ORDER — WHITE PETROLATUM GEL
Status: AC
  Administered 2015-07-24: 0.2
  Filled 2015-07-24: qty 1

## 2015-07-24 MED ORDER — METOPROLOL TARTRATE 1 MG/ML IV SOLN
5.0000 mg | INTRAVENOUS | Status: DC | PRN
  Administered 2015-07-25 – 2015-07-27 (×3): 5 mg via INTRAVENOUS
  Filled 2015-07-24 (×2): qty 5

## 2015-07-24 MED ORDER — CEFUROXIME SODIUM 1.5 G IJ SOLR
1.5000 g | Freq: Once | INTRAMUSCULAR | Status: AC
  Administered 2015-07-27: 1.5 g via INTRAVENOUS
  Filled 2015-07-24: qty 1.5

## 2015-07-24 NOTE — Progress Notes (Signed)
SLP  MBS completed: pt with mild dysphagia, trace aspiration of thin liquids; continue Dysphagia 3, nectars.  See MBS under imaging.    Claudetta Sallie L. Tivis Ringer, [place] CCC/SLP Pager [phone]

## 2015-07-24 NOTE — Progress Notes (Signed)
Initial Nutrition Assessment  DOCUMENTATION CODES:   Non-severe (moderate) malnutrition in context of chronic illness  INTERVENTION:    Snacks BID between meals (pudding)  Magic cup TID with meals, each supplement provides 290 kcal and 9 grams of protein  NUTRITION DIAGNOSIS:   Malnutrition related to chronic illness as evidenced by energy intake < 75% for > or equal to 1 month, mild depletion of muscle mass.  GOAL:   Patient will meet greater than or equal to 90% of their needs  MONITOR:   PO intake, Supplement acceptance, Labs, Weight trends, Skin  REASON FOR ASSESSMENT:   Malnutrition Screening Tool    ASSESSMENT:   81 y.o. male with Past medical history of pulmonary hypertension with right-sided heart failure, B-12 deficiency, BPH, atrial fibrillation on anticoagulation, hypothyroidism, Parkinson's disease.  Spoke with patient and his daughter. Patient with some weight loss over the past few months. He reports that he has been eating poorly for several months. Nutrition-Focused physical exam completed. Findings are no fat depletion, mild muscle depletion, and mild edema. SLP following for dysphagia; he was placed on a dysphagia 3 diet with nectar thick liquids yesterday, eating poorly. Going for a MBS this AM.    Diet Order:  DIET DYS 3 Room service appropriate?: Yes with Assist; Fluid consistency:: Nectar Thick  Skin:  Wound (see comment) (stage II pressure ulcer to buttocks)  Last BM:  unknown  Height:   Ht Readings from Last 1 Encounters:  07/23/15 6' (1.829 m)    Weight:   Wt Readings from Last 1 Encounters:  07/24/15 210 lb 15.7 oz (95.7 kg)    Ideal Body Weight:  80.9 kg  BMI:  Body mass index is 28.61 kg/(m^2).  Estimated Nutritional Needs:   Kcal:  2050-2250  Protein:  100-115 gm  Fluid:  2-2.2 L  EDUCATION NEEDS:   No education needs identified at this time   Molli Barrows, [place], [place], [place] Pager [phone] After Hours Pager  [phone]

## 2015-07-24 NOTE — Progress Notes (Signed)
ANTICOAGULATION CONSULT NOTE - Initial Consult  Pharmacy Consult for Coumadin Indication: atrial fibrillation  Allergies  Allergen Reactions  . Penicillins Rash    Happened 60 years ago; Tolerates Cephs    Patient Measurements: Height: 6' (182.9 cm) Weight: 210 lb 15.7 oz (95.7 kg) IBW/kg (Calculated) : 77.6  Vital Signs: Temp: 98.3 F (36.8 C) (08/19 1252) Temp Source: Oral (08/19 1252) BP: 93/59 mmHg (08/19 0600) Pulse Rate: 97 (08/19 0600)  Labs:  Recent Labs  07/08/2015 1758 07/22/15 0508 07/22/15 0604 07/23/15 0353  HGB 9.3*  --  9.4* 8.2*  HCT 31.4*  --  31.2* 27.7*  PLT 243  --  244 244  LABPROT 32.1* 33.4*  --  39.5*  INR 3.19* 3.37*  --  4.21*  CREATININE 2.75*  --  2.73* 2.78*  TROPONINI <0.03  --   --   --     Estimated Creatinine Clearance: 24.6 mL/min (by C-G formula based on Cr of 2.78).   Medical History: Past Medical History  Diagnosis Date  . Family history of malignant neoplasm of gastrointestinal tract   . Unspecified constipation   . Abnormal involuntary movements(781.0)   . Vitamin B12 deficiency   . Claudication   . Hypertrophy of prostate with urinary obstruction and other lower urinary tract symptoms (LUTS)   . Obesity   . Mixed hyperlipidemia   . Atrial fibrillation     --myoview 07/2006: not gated. No ischemia or infarct  Diastolic HF-- Echo 06/6733 ER60% mild AI/MR. RV mild to moderately dilated/ dysfunction. ? restrictive CM . RVSP 36  . Bladder polyps   . Kidney stones   . Unspecified sleep apnea     NPSG 05/14/2007- AHI 80.7/ hr  . OSA (obstructive sleep apnea)     "tried mask; couldn't use it; so I quit" (11/12/2014)  . Hypothyroidism   . History of hiatal hernia   . Daily headache     "I've had headaches all my life; get them almost daily" (11/12/2014)  . Stroke ~ 1985    "simple stroke" denies residual on 11/12/2014  . Arthritis     "just about qwhere"   . Pleural effusion 11/12/2014 admission  . Dysrhythmia     Afib   . Type II or unspecified type diabetes mellitus with neurological manifestations, not stated as uncontrolled   . Shortness of breath dyspnea     walking distance or climbing stairs  . Neuromuscular disorder     Parkinson's / age 89 had growth per left shoulder/neck area that stopped blood flow to left arm was removed and has not no further problems.    Medications:  Coumadin 5mg  po daily except 2.5mg  on MWF per outpatient [facility] clinic note on 8/9 but wife said he's been taking 5mg  daily except 2.5mg  on Sun, Tues, and Fri  Assessment: 81 yo M admitted with HCAP on chronic Coumadin for Afib.  INR elevated on admission (3.19).  Last dose taken PTA 8/15.  Per outpatient flow sheet patient has been on current regimen for several months with mostly therapeutic INRs.  At most recent visit (07/14/15) INR was 3.5.  Dose was held x1 day.     Significant events: - 8/17: L thoracentesis for pleural effusion  Today, 07/24/2015:  INR up 4.07 despite dose held on 8/18  hgb down slightly to 8.8, plt ok  No bleeding documented  Goal of Therapy:  INR 2-3   Plan:  - continue to hold Coumadin today - f/u with labs in  AM and resume coumadin if/when appropriate - monitor for s/s bleeding  Andrey Cota. Diona Foley, PharmD Clinical Pharmacist Pager [phone] Sharilyn Sites M 07/24/2015,1:22 PM

## 2015-07-24 NOTE — Progress Notes (Signed)
[place] TEAM 1 - Stepdown/ICU TEAM Progress Note  NIMAI BURBACH LYY:[id number] DOB: 02-08-1931 DOA: 07/07/2015 PCP: Nyoka Cowden, MD  Admit HPI / Brief Narrative: 81 year old with mild COPD, obstructive sleep apnea, pulmonary hypertension, A. Fib, and recurrent right pleural effusion of unclear etiology (had a Pleurx catheter on the right side in the past) admitted 07/17/2015 with a large left pleural effusion and respiratory failure. He was admitted at the end of July 2016 for left lower lobe pneumonia and a urinary tract infection.   HPI/Subjective: The patient is somewhat dyspneic this afternoon.  He denies chest pain nausea vomiting or abdominal pain.  He does not appear to be in acute distress.  Assessment/Plan:  Acute hypoxic respiratory failure - recurrent large left pleural effusion TCTS plans to place a Pleurx catheter when INR less than 2.0 (tentative plan for Monday) - exudate per pleural studies - cultures pending - continue empiric antibiotic - cytology from pleural fluid pending - repeat thoracentesis if respiratory status declines  Chronic Atrial fibrillation On chronic Coumadin therapy - Coumadin presently on hold for pending procedure - his rate is presently poorly controlled - adjust medical therapy and follow  Hypothyroidism TSH was elevated 06/23/2015 but free T4 was normal - will not adjust therapy  Chronic kidney disease Baseline creatinine appears to be approximately 2.5 - creatinine presently stable  Mild dysphagia Modified diet per SLP  Parkinson's disease Continue home medical therapy  DM 2 Reasonably controlled at this time - no change in treatment plan today  UTI should be covered with current broad-spectrum antibiotics  MRSA Screen +  Code Status: NO CODE - DNR Family Communication: no family present at time of exam Disposition Plan: SDU  Consultants: Pulm TCTS  Procedures: 8/17 - left thoracentesis - 1 L bloody  exudate  Antibiotics: Vancomycin 8/16 > Aztreonam 8/16 >  DVT prophylaxis: Warfarin - SCDs  Objective: Blood pressure 110/55, pulse 111, temperature 98.8 F (37.1 C), temperature source Oral, resp. rate 22, height 6' (1.829 m), weight 95.7 kg (210 lb 15.7 oz), SpO2 97 %.  Intake/Output Summary (Last 24 hours) at 07/24/15 1620 Last data filed at 07/24/15 1253  Gross per 24 hour  Intake 1681.25 ml  Output   1175 ml  Net 506.25 ml   Exam: General: Mild respiratory distress but patient able to converse in full sentences Lungs: Poor air movement left base with no wheeze and good air movement throughout right fields Cardiovascular: Irregularly irregular and tachycardic with present rate approximately 120 bpm Abdomen: Nontender, nondistended, soft, bowel sounds positive, no rebound, no ascites, no appreciable mass Extremities: No significant cyanosis, clubbing, or edema bilateral lower extremities  Data Reviewed: Basic Metabolic Panel:  Recent Labs Lab 08/01/2015 1758 07/22/15 0604 07/23/15 0353 07/24/15 1245  NA 141 140 139 140  K 4.7 4.6 4.7 4.3  CL 104 103 102 104  CO2 30 30 28 28   GLUCOSE 134* 151* 106* 165*  BUN 83* 84* 89* 77*  CREATININE 2.75* 2.73* 2.78* 2.69*  CALCIUM 10.5* 10.2 10.1 9.5    CBC:  Recent Labs Lab 07/26/2015 1758 07/22/15 0604 07/23/15 0353 07/24/15 1245  WBC 7.9 8.1 9.2 7.0  NEUTROABS 5.7 6.0  --   --   HGB 9.3* 9.4* 8.2* 8.8*  HCT 31.4* 31.2* 27.7* 29.0*  MCV 89.5 91.0 90.2 89.8  PLT 243 244 244 227    Liver Function Tests:  Recent Labs Lab 07/20/2015 1758 07/22/15 0604  AST 16 13*  ALT <5* 6*  ALKPHOS 230* 238*  BILITOT 0.4 0.5  PROT 7.4 7.5  ALBUMIN 2.9* 2.8*   Coags:  Recent Labs Lab 07/17/2015 1758 07/22/15 0508 07/23/15 0353 07/24/15 1245  INR 3.19* 3.37* 4.21* 4.07*   Cardiac Enzymes:  Recent Labs Lab 07/07/2015 1758  TROPONINI <0.03    CBG:  Recent Labs Lab 07/23/15 2156 07/24/15 0003 07/24/15 0409  07/24/15 0811 07/24/15 1206  GLUCAP 136* 134* 136* 101* 163*    Recent Results (from the past 240 hour(s))  Culture, blood (x 2)     Status: None (Preliminary result)   Collection Time: 07/16/2015  8:28 PM  Result Value Ref Range Status   Specimen Description BLOOD LEFT HAND  Final   Special Requests BOTTLES DRAWN AEROBIC AND ANAEROBIC 5CC  Final   Culture   Final    NO GROWTH 2 DAYS Performed at [facility]    Report Status PENDING  Incomplete  Culture, blood (x 2)     Status: None (Preliminary result)   Collection Time: 08/04/2015  8:33 PM  Result Value Ref Range Status   Specimen Description BLOOD RIGHT HAND  Final   Special Requests BOTTLES DRAWN AEROBIC AND ANAEROBIC 5CC  Final   Culture   Final    NO GROWTH 2 DAYS Performed at [facility]    Report Status PENDING  Incomplete  Culture, body fluid-bottle     Status: None (Preliminary result)   Collection Time: 07/22/15 10:36 AM  Result Value Ref Range Status   Specimen Description FLUID PLEURAL  Final   Special Requests NONE  Final   Culture   Final    NO GROWTH 2 DAYS Performed at [facility]    Report Status PENDING  Incomplete  Gram stain     Status: None (Preliminary result)   Collection Time: 07/22/15 10:36 AM  Result Value Ref Range Status   Specimen Description FLUID PLEURAL  Final   Special Requests NONE  Final   Gram Stain   Final    MODERATE WBC PRESENT,BOTH PMN AND MONONUCLEAR NO ORGANISMS SEEN Performed at [facility]    Report Status PENDING  Incomplete  MRSA PCR Screening     Status: Abnormal   Collection Time: 07/22/15  3:17 PM  Result Value Ref Range Status   MRSA by PCR POSITIVE (A) NEGATIVE Final    Comment:        The GeneXpert MRSA Assay (FDA approved for NASAL specimens only), is one component of a comprehensive MRSA colonization surveillance program. It is not intended to diagnose MRSA infection nor to guide or monitor treatment for MRSA  infections. RESULT CALLED TO, READ BACK BY AND VERIFIED WITH: JESSICA CONRAD,RN [id number] @ 70 BY J SCOTTON      Studies:   Recent x-ray studies have been reviewed in detail by the Attending Physician  Scheduled Meds:  Scheduled Meds: . aztreonam  1 g Intravenous 3 times per day  . carbidopa-levodopa  1 tablet Oral TID  . [START ON 07/26/2015] cefUROXime (ZINACEF)  IV  1.5 g Intravenous Once  . Chlorhexidine Gluconate Cloth  6 each Topical Q0600  . darifenacin  7.5 mg Oral Daily  . gabapentin  100 mg Oral Q1200  . gabapentin  200 mg Oral QHS  . insulin aspart  0-9 Units Subcutaneous 6 times per day  . insulin glargine  25 Units Subcutaneous Q2200  . levothyroxine  100 mcg Oral QAC breakfast  . metoprolol succinate  50 mg Oral Daily  .  mupirocin ointment  1 application Nasal BID  . pravastatin  20 mg Oral q1800  . sertraline  25 mg Oral Daily  . tamsulosin  0.4 mg Oral Daily  . vancomycin  1,000 mg Intravenous Q24H  . Warfarin - Pharmacist Dosing Inpatient   Does not apply q1800    Time spent on care of this patient: 35 mins   [facility] T , MD   [facility] Office  [phone] Pager - Text Page per Amion as per below:  On-Call/Text Page:      [email or web address]      password [email or web address]  If 7PM-7AM, please contact night-coverage [email or web address] Password [email or web address] 07/24/2015, 4:20 PM   LOS: 3 days

## 2015-07-24 NOTE — Progress Notes (Signed)
Procedure(s) (LRB): INSERTION PLEURAL DRAINAGE CATHETER (Left) Subjective: Difficult situation with recurrent large L effusion in elderly male with severe comorbidities and INR > 4 - on coumadin for a-fib  Will plan L Pleurx when INR < 2- prob mon. Hold coumadin Would rec repeat L thoracenresis if respiratory status deteriorates in the interim  Objective: Vital signs in last 24 hours: Temp:  [97.4 F (36.3 C)-98.7 F (37.1 C)] 97.5 F (36.4 C) (08/19 0400) Pulse Rate:  [61-111] 97 (08/19 0600) Cardiac Rhythm:  [-] Atrial fibrillation (08/19 0415) Resp:  [18-30] 21 (08/19 0600) BP: (90-113)/(51-65) 93/59 mmHg (08/19 0600) SpO2:  [91 %-99 %] 98 % (08/19 0600) Weight:  [209 lb 14.1 oz (95.2 kg)-210 lb 15.7 oz (95.7 kg)] 210 lb 15.7 oz (95.7 kg) (08/19 0422)  Hemodynamic parameters for last 24 hours:  afif 3 L [place]   Intake/Output from previous day: 08/18 0701 - 08/19 0700 In: 1731.3 [I.V.:1381.3; IV Piggyback:350] Out: 825 [Urine:825] Intake/Output this shift:      Lab Results:  Recent Labs  07/22/15 0604 07/23/15 0353  WBC 8.1 9.2  HGB 9.4* 8.2*  HCT 31.2* 27.7*  PLT 244 244   BMET:  Recent Labs  07/22/15 0604 07/23/15 0353  NA 140 139  K 4.6 4.7  CL 103 102  CO2 30 28  GLUCOSE 151* 106*  BUN 84* 89*  CREATININE 2.73* 2.78*  CALCIUM 10.2 10.1    PT/INR:  Recent Labs  07/23/15 0353  LABPROT 39.5*  INR 4.21*   ABG    Component Value Date/Time   PHART 7.344* 07/15/2015 2110   HCO3 29.5* 07/10/2015 2110   TCO2 27.8 07/20/2015 2110   O2SAT 94.0 07/26/2015 2110   CBG (last 3)   Recent Labs  07/23/15 2156 07/24/15 0003 07/24/15 0409  GLUCAP 136* 134* 136*    Assessment/Plan: S/P Procedure(s) (LRB): INSERTION PLEURAL DRAINAGE CATHETER (Left) Plan L Pleurx Mon if INR < 2   LOS: 3 days    Tharon Aquas Trigt III 07/24/2015

## 2015-07-24 NOTE — Progress Notes (Signed)
[place] for Vancomycin, Aztreonam Indication: HCAP  Allergies  Allergen Reactions  . Penicillins Rash    Happened 60 years ago; Tolerates Cephs    Patient Measurements: Height: 6' (182.9 cm) Weight: 210 lb 15.7 oz (95.7 kg) IBW/kg (Calculated) : 77.6  Vital Signs: Temp: 98.3 F (36.8 C) (08/19 1252) Temp Source: Oral (08/19 1252) BP: 93/59 mmHg (08/19 0600) Pulse Rate: 97 (08/19 0600) Intake/Output from previous day: 08/18 0701 - 08/19 0700 In: 1731.3 [I.V.:1381.3; IV Piggyback:350] Out: 975 [Urine:975] Intake/Output from this shift: Total I/O In: -  Out: 200 [Urine:200]  Labs:  Recent Labs  07/27/2015 1758 07/22/15 0604 07/23/15 0353  WBC 7.9 8.1 9.2  HGB 9.3* 9.4* 8.2*  PLT 243 244 244  CREATININE 2.75* 2.73* 2.78*   Estimated Creatinine Clearance: 24.6 mL/min (by C-G formula based on Cr of 2.78). No results for input(s): VANCOTROUGH, VANCOPEAK, VANCORANDOM, GENTTROUGH, GENTPEAK, GENTRANDOM, TOBRATROUGH, TOBRAPEAK, TOBRARND, AMIKACINPEAK, AMIKACINTROU, AMIKACIN in the last 72 hours.   Microbiology: Recent Results (from the past 720 hour(s))  Culture, blood (x 2)     Status: None (Preliminary result)   Collection Time: 07/07/2015  8:28 PM  Result Value Ref Range Status   Specimen Description BLOOD LEFT HAND  Final   Special Requests BOTTLES DRAWN AEROBIC AND ANAEROBIC 5CC  Final   Culture   Final    NO GROWTH 1 DAY Performed at [facility]    Report Status PENDING  Incomplete  Culture, blood (x 2)     Status: None (Preliminary result)   Collection Time: 07/13/2015  8:33 PM  Result Value Ref Range Status   Specimen Description BLOOD RIGHT HAND  Final   Special Requests BOTTLES DRAWN AEROBIC AND ANAEROBIC 5CC  Final   Culture   Final    NO GROWTH 1 DAY Performed at [facility]    Report Status PENDING  Incomplete  Culture, body fluid-bottle     Status: None (Preliminary result)   Collection Time: 07/22/15  10:36 AM  Result Value Ref Range Status   Specimen Description FLUID PLEURAL  Final   Special Requests NONE  Final   Culture   Final    NO GROWTH < 24 HOURS Performed at [facility]    Report Status PENDING  Incomplete  Gram stain     Status: None (Preliminary result)   Collection Time: 07/22/15 10:36 AM  Result Value Ref Range Status   Specimen Description FLUID PLEURAL  Final   Special Requests NONE  Final   Gram Stain   Final    MODERATE WBC PRESENT,BOTH PMN AND MONONUCLEAR NO ORGANISMS SEEN Performed at [facility]    Report Status PENDING  Incomplete  MRSA PCR Screening     Status: Abnormal   Collection Time: 07/22/15  3:17 PM  Result Value Ref Range Status   MRSA by PCR POSITIVE (A) NEGATIVE Final    Comment:        The GeneXpert MRSA Assay (FDA approved for NASAL specimens only), is one component of a comprehensive MRSA colonization surveillance program. It is not intended to diagnose MRSA infection nor to guide or monitor treatment for MRSA infections. RESULT CALLED TO, READ BACK BY AND VERIFIED WITH: JESSICA CONRAD,RN [id number] @ 34 BY J SCOTTON     Medical History: Past Medical History  Diagnosis Date  . Family history of malignant neoplasm of gastrointestinal tract   . Unspecified constipation   . Abnormal involuntary movements(781.0)   .  Vitamin B12 deficiency   . Claudication   . Hypertrophy of prostate with urinary obstruction and other lower urinary tract symptoms (LUTS)   . Obesity   . Mixed hyperlipidemia   . Atrial fibrillation     --myoview 07/2006: not gated. No ischemia or infarct  Diastolic HF-- Echo 12/6107 ER60% mild AI/MR. RV mild to moderately dilated/ dysfunction. ? restrictive CM . RVSP 36  . Bladder polyps   . Kidney stones   . Unspecified sleep apnea     NPSG 05/14/2007- AHI 80.7/ hr  . OSA (obstructive sleep apnea)     "tried mask; couldn't use it; so I quit" (11/12/2014)  . Hypothyroidism   . History of hiatal  hernia   . Daily headache     "I've had headaches all my life; get them almost daily" (11/12/2014)  . Stroke ~ 1985    "simple stroke" denies residual on 11/12/2014  . Arthritis     "just about qwhere"   . Pleural effusion 11/12/2014 admission  . Dysrhythmia     Afib  . Type II or unspecified type diabetes mellitus with neurological manifestations, not stated as uncontrolled   . Shortness of breath dyspnea     walking distance or climbing stairs  . Neuromuscular disorder     Parkinson's / age 81 had growth per left shoulder/neck area that stopped blood flow to left arm was removed and has not no further problems.    Medications:  Anti-infectives    Start     Dose/Rate Route Frequency Ordered Stop   07/22/15 2200  vancomycin (VANCOCIN) IVPB 1000 mg/200 mL premix     1,000 mg 200 mL/hr over 60 Minutes Intravenous Every 24 hours 07/25/2015 2106     07/22/15 0600  aztreonam (AZACTAM) 1 g in dextrose 5 % 50 mL IVPB     1 g 100 mL/hr over 30 Minutes Intravenous 3 times per day 07/15/2015 2106     07/06/2015 2030  aztreonam (AZACTAM) 2 g in dextrose 5 % 50 mL IVPB     2 g 100 mL/hr over 30 Minutes Intravenous  Once 07/17/2015 2027 07/22/15 0005   07/06/2015 2030  vancomycin (VANCOCIN) IVPB 1000 mg/200 mL premix     1,000 mg 200 mL/hr over 60 Minutes Intravenous  Once 07/26/2015 2027 07/22/15 0005   07/12/2015 2015  cefTRIAXone (ROCEPHIN) 1 g in dextrose 5 % 50 mL IVPB  Status:  Discontinued     1 g 100 mL/hr over 30 Minutes Intravenous  Once 07/20/2015 2013 07/23/2015 2024     Assessment: 87 yoM presented to ED on 8/16 with SOB and weakness.  Significant PMH includes COPD, chronic bronchitis, right sided CHF, and chronic respiratory failure with hypoxia.  He is followed by [facility] for OSA, recurrent Rt pleural effusion, etc.  CXR 8/16 shows increasing, large left pleural effusion, LLL consolidation unchanged since 06/23/15 likely representing persistent pneumonia, small right pleural effusion.   Pharmacy is consulted to dose Vancomycin and Zosyn for HCAP.  Vanc 8/16>> Aztreonam 8/16>>    Today, 07/24/2015:  Afeb  WBC 9.2  SCr 2.78 with CrCl ~ 25 ml/min  Cultures show NGTD  MRSA PCR is positive  Goal of Therapy:  Vancomycin trough level 15-20 mcg/ml  Plan:  Aztreonam 1g IV q8h Vancomycin 1g IV q24h Measure Vanc trough at steady state Follow up renal fxn, culture results, and clinical course  Andrey Cota. Diona Foley, PharmD Clinical Pharmacist Pager [phone]  07/24/2015 1:26 PM

## 2015-07-24 NOTE — Progress Notes (Signed)
[facility]  Patient Status: Active (receiving services up to time of hospitalization)  [facility] is providing the following services: RN and PT  If patient discharges after hours, please call [phone].   [name] [name] 07/24/2015, 11:25 AM

## 2015-07-24 NOTE — Progress Notes (Signed)
Name: [name] [name] MRN: [id number] DOB: 09-Nov-1931    ADMISSION DATE:  07/13/2015 CONSULTATION DATE:8/18  REFERRING MD :  [facility]  CHIEF COMPLAINT:  SOB  BRIEF PATIENT DESCRIPTION: Frail 81 yo male with known h/o COPD and bilateral chronic pleural effusions.  SIGNIFICANT EVENTS  8/16 - Admitted to [facility] 8/17 - Left Thoracentesis 8/18 - Transfer to [facility] from [place]:  8/17 - left thoracentesis 1 litre bloody concordant exudate 8/18 - swallow eval>> 8/19 - CXR w/ reaccumulation of pleural effusion.  SUBJECTIVE: No events overnight. Patient denies any chest pain or pressure. Reports he is breathing comfortably. Mild intermittent coughing. No fever, chills, or sweats.  ROS:  No nausea or emesis. No abdominal pain. No headache or near syncope.  VITAL SIGNS: Temp:  [97.5 F (36.4 C)-98.7 F (37.1 C)] 98 F (36.7 C) (08/19 0700) Pulse Rate:  [61-111] 97 (08/19 0600) Resp:  [18-30] 21 (08/19 0600) BP: (92-113)/(51-62) 93/59 mmHg (08/19 0600) SpO2:  [91 %-99 %] 98 % (08/19 0600) Weight:  [209 lb 14.1 oz (95.2 kg)-210 lb 15.7 oz (95.7 kg)] 210 lb 15.7 oz (95.7 kg) (08/19 0422)  PHYSICAL EXAMINATION: General:  No distress. Sitting up in bed eating breakfast. Awake. Daughter at bedside. Neuro: Moving all 4 extremities. CN 2-12 grossly in tact. HEENT:  MMM. No scleral icterus. Cardiovascular:  No edema. Regular rate. Unable to appreciate JVD. Lungs: Decreased BS bilateral bases left > right. Normal work of breathing on [place] oxygen. Speaking in complete sentences. Abdomen:  Soft. Nontender. Mildly protuberant.   Recent Labs Lab 07/08/2015 1758 07/22/15 0604 07/23/15 0353  NA 141 140 139  K 4.7 4.6 4.7  CL 104 103 102  CO2 30 30 28   BUN 83* 84* 89*  CREATININE 2.75* 2.73* 2.78*  GLUCOSE 134* 151* 106*    Recent Labs Lab 07/19/2015 1758 07/22/15 0604 07/23/15 0353  HGB 9.3* 9.4* 8.2*  HCT 31.4* 31.2* 27.7*  WBC 7.9 8.1 9.2  PLT 243 244 244   Dg Chest  1 View  07/22/2015   CLINICAL DATA:  Left thoracentesis.  EXAM: CHEST  1 VIEW  COMPARISON:  Two-view chest x-ray 08/04/2015.  FINDINGS: A moderate size left pleural effusion persist. There is no pneumothorax. A small right pleural effusion is stable with some fluid extending into the minor fissure.  The left pleural effusion obscures the the heart. Atherosclerotic changes are noted at the aortic arch. Mild edema is present.  IMPRESSION: 1. Mild decrease in left pleural effusion without evidence for pneumothorax. 2. Cardiomegaly and edema compatible with congestive heart failure. 3. Atherosclerosis.   Electronically Signed   By: San Morelle M.D.   On: 07/22/2015 11:20   Ct Chest Wo Contrast  07/22/2015   CLINICAL DATA:  Pleural effusion, shortness of breath, and atrial fibrillation.  EXAM: CT CHEST WITHOUT CONTRAST  TECHNIQUE: Multidetector CT imaging of the chest was performed following the standard protocol without IV contrast.  COMPARISON:  Chest x-ray dated 07/22/2015 and chest CT dated 06/23/2005  FINDINGS: The left pleural effusion has markedly increased. There is complete atelectasis of the left lower lobe. There is compressive atelectasis of much of the left upper lobe.  There is and moderate right pleural effusion, slightly increased. The patient has multiple right pulmonary nodules including a 11 mm nodule in the medial aspect of the right upper lobe with 2 adjacent 3 mm nodules all seen on image 25. There is a poorly defined 6 mm nodule in the right  middle lobe on image 34 with a 12 mm nodule in the right lower lobe on the same image. There is a 13 mm nodule in the right lower lobe adjacent to the major fissure.  There is slight atelectasis at the right lung base with some benign calcifications in the periphery of the right lower lobe. There is chronic cardiomegaly with a small pleural effusion. There is a slightly enlarged superior mat mediastinal lymph node between the right innominate vein  and the anterior aspect of the aortic arch on image number 25 measuring 12 mm in diameter. There is a benign precarinal lymph node with a fatty hilum. No definitive hilar adenopathy. No pulmonary nodules noted on the left.  There is chronic accentuation of the thoracic kyphosis. The visualized portion of the upper abdomen is normal.  IMPRESSION: 1. Large left pleural effusion with compressive atelectasis of the left lower lobe and much of the left upper lobe. 2. Slight increased moderate right pleural effusion. 3. Multiple poorly defined pulmonary nodules in the right lung which are worrisome for metastatic disease. These were not present chest CT dated 01/14/2015. 4. Single enlarged lymph node in the superior mediastinum which could represent metastasis. 5. Chronic cardiomegaly with a small pericardial effusion.   Electronically Signed   By: Lorriane Shire M.D.   On: 07/22/2015 18:14   Dg Chest Port 1 View  07/24/2015   CLINICAL DATA:  Shortness of breath.  EXAM: PORTABLE CHEST - 1 VIEW  COMPARISON:  07/22/2015.  CT 07/22/2015.  FINDINGS: Progressive consolidation left lung with progressive left pleural effusion. Basilar atelectasis on right. Nodularity noted on the right, this is best seen by recent CT. As noted on CT metastatic disease could present this fashion. Cardiomegaly. No pneumothorax.  IMPRESSION: 1. Progressive consolidation of the left lung with progressive left pleural effusion.  2. Pulmonary nodules right lung, best identified by prior CT. Basilar atelectasis right lung.  3.  Cardiomegaly.   Electronically Signed   By: Marcello Moores  Register   On: 07/24/2015 07:29   ASSESSMENT/PLAN:  1.  Left Pleural Effusion:  Concordant exudate. Cultures pending. Continuing on Vancomycin & Aztreonam. CT Surgery plans for pleural catheter once INR <2.0. Will need repeat cytology from pleural fluid. Plan for repeat thoracentesis only if respiratory status decompensates.  2.  Atrial Fibrillation:  On chronic  coumadin. Coumadin on hold. Monitoring on Telemetry.  3.  Coagulopathy:  Secondary to Coumadin. Holding Coumadin. Daily CBC & PT/INR.  4.  Pulmonary Hypertension:  No apparent decompensation.  5.  Acute Hypoxic Respiratory Failure:  Likely secondary to decompensated CHF. Continuing supplemental oxygen for Sat >92%.  Patient will not be seen over the weekend. Please contact the on call physician if his respiratory status declines and needs a repeat thoracentesis.  If this is done it should again be sent for PFA, Cultures, and Cytology.  Sonia Baller Ashok Cordia, M.D. [facility] Pager:  [phone] After 3pm or if no response, call [phone] 07/24/2015, 10:41 AM

## 2015-07-25 ENCOUNTER — Inpatient Hospital Stay (HOSPITAL_COMMUNITY): Payer: Medicare Other

## 2015-07-25 LAB — COMPREHENSIVE METABOLIC PANEL
ALT: <5 U/L — ABNORMAL LOW (ref 17–63)
ANION GAP: 8 (ref 5–15)
AST: 8 U/L — ABNORMAL LOW (ref 15–41)
Albumin: 2.2 g/dL — ABNORMAL LOW (ref 3.5–5.0)
Alkaline Phosphatase: 198 U/L — ABNORMAL HIGH (ref 38–126)
BUN: 77 mg/dL — ABNORMAL HIGH (ref 6–20)
CHLORIDE: 107 mmol/L (ref 101–111)
CO2: 25 mmol/L (ref 22–32)
Calcium: 9.2 mg/dL (ref 8.9–10.3)
Creatinine, Ser: 2.61 mg/dL — ABNORMAL HIGH (ref 0.61–1.24)
GFR, EST AFRICAN AMERICAN: 25 mL/min — AB (ref >60)
GFR, EST NON AFRICAN AMERICAN: 21 mL/min — AB (ref >60)
Glucose, Bld: 180 mg/dL — ABNORMAL HIGH (ref 65–99)
POTASSIUM: 4.4 mmol/L (ref 3.5–5.1)
Sodium: 140 mmol/L (ref 135–145)
Total Bilirubin: 0.4 mg/dL (ref 0.3–1.2)
Total Protein: 6.7 g/dL (ref 6.5–8.1)

## 2015-07-25 LAB — CBC
HCT: 28.3 % — ABNORMAL LOW (ref 39.0–52.0)
Hemoglobin: 8.7 g/dL — ABNORMAL LOW (ref 13.0–17.0)
MCH: 27.7 pg (ref 26.0–34.0)
MCHC: 30.7 g/dL (ref 30.0–36.0)
MCV: 90.1 fL (ref 78.0–100.0)
Platelets: 221 10*3/uL (ref 150–400)
RBC: 3.14 MIL/uL — ABNORMAL LOW (ref 4.22–5.81)
RDW: 16.3 % — ABNORMAL HIGH (ref 11.5–15.5)
WBC: 8 10*3/uL (ref 4.0–10.5)

## 2015-07-25 LAB — GLUCOSE, CAPILLARY
GLUCOSE-CAPILLARY: 146 mg/dL — AB (ref 65–99)
GLUCOSE-CAPILLARY: 205 mg/dL — AB (ref 65–99)
GLUCOSE-CAPILLARY: 215 mg/dL — AB (ref 65–99)
Glucose-Capillary: 217 mg/dL — ABNORMAL HIGH (ref 65–99)

## 2015-07-25 LAB — PROTIME-INR
INR: 4.02 — ABNORMAL HIGH (ref 0.00–1.49)
Prothrombin Time: 38.1 seconds — ABNORMAL HIGH (ref 11.6–15.2)

## 2015-07-25 LAB — VANCOMYCIN, TROUGH: VANCOMYCIN TR: 28 ug/mL — AB (ref 10.0–20.0)

## 2015-07-25 LAB — MAGNESIUM: MAGNESIUM: 2.2 mg/dL (ref 1.7–2.4)

## 2015-07-25 MED ORDER — INSULIN GLARGINE 100 UNIT/ML ~~LOC~~ SOLN
30.0000 [IU] | Freq: Every day | SUBCUTANEOUS | Status: DC
  Administered 2015-07-25: 30 [IU] via SUBCUTANEOUS
  Filled 2015-07-25 (×2): qty 0.3

## 2015-07-25 MED ORDER — VANCOMYCIN HCL IN DEXTROSE 1-5 GM/200ML-% IV SOLN
1000.0000 mg | INTRAVENOUS | Status: DC
  Administered 2015-07-26 – 2015-07-28 (×2): 1000 mg via INTRAVENOUS
  Filled 2015-07-25 (×2): qty 200

## 2015-07-25 MED ORDER — CETYLPYRIDINIUM CHLORIDE 0.05 % MT LIQD
7.0000 mL | Freq: Two times a day (BID) | OROMUCOSAL | Status: DC
  Administered 2015-07-25 – 2015-07-28 (×8): 7 mL via OROMUCOSAL

## 2015-07-25 MED ORDER — METOPROLOL TARTRATE 1 MG/ML IV SOLN
10.0000 mg | Freq: Four times a day (QID) | INTRAVENOUS | Status: DC
  Administered 2015-07-25 – 2015-07-29 (×14): 10 mg via INTRAVENOUS
  Filled 2015-07-25 (×19): qty 10

## 2015-07-25 MED ORDER — CHLORHEXIDINE GLUCONATE 0.12 % MT SOLN
15.0000 mL | Freq: Two times a day (BID) | OROMUCOSAL | Status: DC
  Administered 2015-07-25 – 2015-07-28 (×7): 15 mL via OROMUCOSAL
  Filled 2015-07-25 (×10): qty 15

## 2015-07-25 MED ORDER — DEXTROSE 5 % IV SOLN
5.0000 mg | Freq: Once | INTRAVENOUS | Status: AC
  Administered 2015-07-25: 5 mg via INTRAVENOUS
  Filled 2015-07-25: qty 0.5

## 2015-07-25 MED ORDER — VITAMIN K1 10 MG/ML IJ SOLN
5.0000 mg | Freq: Once | INTRAVENOUS | Status: AC
  Administered 2015-07-25: 5 mg via INTRAVENOUS
  Filled 2015-07-25: qty 0.5

## 2015-07-25 MED ORDER — FUROSEMIDE 10 MG/ML IJ SOLN
40.0000 mg | Freq: Once | INTRAMUSCULAR | Status: AC
  Administered 2015-07-25: 40 mg via INTRAVENOUS
  Filled 2015-07-25: qty 4

## 2015-07-25 MED ORDER — FUROSEMIDE 10 MG/ML IJ SOLN
40.0000 mg | Freq: Once | INTRAMUSCULAR | Status: AC
  Administered 2015-07-26: 40 mg via INTRAVENOUS
  Filled 2015-07-25: qty 4

## 2015-07-25 NOTE — Progress Notes (Signed)
ANTIBIOTIC CONSULT NOTE - FOLLOW UP  Pharmacy Consult for Vancomycin Indication: pneumonia  Allergies  Allergen Reactions  . Penicillins Rash    Happened 60 years ago; Tolerates Cephs    Patient Measurements: Height: 6' (182.9 cm) Weight: 215 lb 6.2 oz (97.7 kg) IBW/kg (Calculated) : 77.6 Adjusted Body Weight:    Vital Signs: Temp: 98.1 F (36.7 C) (08/20 2000) Temp Source: Oral (08/20 2000) BP: 114/68 mmHg (08/20 2000) Pulse Rate: 116 (08/20 2000) Intake/Output from previous day: 08/19 0701 - 08/20 0700 In: 2225 [I.V.:1875; IV Piggyback:350] Out: 825 [Urine:825] Intake/Output from this shift: Total I/O In: 87.5 [I.V.:87.5] Out: -   Labs:  Recent Labs  07/23/15 0353 07/24/15 1245 07/25/15 0230  WBC 9.2 7.0 8.0  HGB 8.2* 8.8* 8.7*  PLT 244 227 221  CREATININE 2.78* 2.69* 2.61*   Estimated Creatinine Clearance: 26.4 mL/min (by C-G formula based on Cr of 2.61).  Recent Labs  07/25/15 2028  [place] 28*     Microbiology: Recent Results (from the past 720 hour(s))  Culture, blood (x 2)     Status: None (Preliminary result)   Collection Time: 07/07/2015  8:28 PM  Result Value Ref Range Status   Specimen Description BLOOD LEFT HAND  Final   Special Requests BOTTLES DRAWN AEROBIC AND ANAEROBIC 5CC  Final   Culture   Final    NO GROWTH 3 DAYS Performed at [facility]    Report Status PENDING  Incomplete  Culture, blood (x 2)     Status: None (Preliminary result)   Collection Time: 07/23/2015  8:33 PM  Result Value Ref Range Status   Specimen Description BLOOD RIGHT HAND  Final   Special Requests BOTTLES DRAWN AEROBIC AND ANAEROBIC 5CC  Final   Culture   Final    NO GROWTH 3 DAYS Performed at [facility]    Report Status PENDING  Incomplete  Culture, body fluid-bottle     Status: None (Preliminary result)   Collection Time: 07/22/15 10:36 AM  Result Value Ref Range Status   Specimen Description FLUID PLEURAL  Final   Special Requests  NONE  Final   Culture   Final    NO GROWTH 3 DAYS Performed at [facility]    Report Status PENDING  Incomplete  Gram stain     Status: None (Preliminary result)   Collection Time: 07/22/15 10:36 AM  Result Value Ref Range Status   Specimen Description FLUID PLEURAL  Final   Special Requests NONE  Final   Gram Stain   Final    MODERATE WBC PRESENT,BOTH PMN AND MONONUCLEAR NO ORGANISMS SEEN Performed at [facility]    Report Status PENDING  Incomplete  MRSA PCR Screening     Status: Abnormal   Collection Time: 07/22/15  3:17 PM  Result Value Ref Range Status   MRSA by PCR POSITIVE (A) NEGATIVE Final    Comment:        The GeneXpert MRSA Assay (FDA approved for NASAL specimens only), is one component of a comprehensive MRSA colonization surveillance program. It is not intended to diagnose MRSA infection nor to guide or monitor treatment for MRSA infections. RESULT CALLED TO, READ BACK BY AND VERIFIED WITH: JESSICA CONRAD,RN [id number] @ 1721 BY J SCOTTON     Anti-infectives    Start     Dose/Rate Route Frequency Ordered Stop   07/14/2015 0600  cefUROXime (ZINACEF) 1.5 g in dextrose 5 % 50 mL IVPB  1.5 g 100 mL/hr over 30 Minutes Intravenous  Once 07/24/15 1553     07/22/15 2200  vancomycin (VANCOCIN) IVPB 1000 mg/200 mL premix  Status:  Discontinued     1,000 mg 200 mL/hr over 60 Minutes Intravenous Every 24 hours 07/30/2015 2106 07/25/15 2205   07/22/15 0600  aztreonam (AZACTAM) 1 g in dextrose 5 % 50 mL IVPB     1 g 100 mL/hr over 30 Minutes Intravenous 3 times per day 07/11/2015 2106     07/09/2015 2030  aztreonam (AZACTAM) 2 g in dextrose 5 % 50 mL IVPB     2 g 100 mL/hr over 30 Minutes Intravenous  Once 07/22/2015 2027 07/22/15 0005   07/30/2015 2030  vancomycin (VANCOCIN) IVPB 1000 mg/200 mL premix     1,000 mg 200 mL/hr over 60 Minutes Intravenous  Once 07/07/2015 2027 07/22/15 0005   07/13/2015 2015  cefTRIAXone (ROCEPHIN) 1 g in dextrose 5 % 50 mL IVPB   Status:  Discontinued     1 g 100 mL/hr over 30 Minutes Intravenous  Once 07/30/2015 2013 07/26/2015 2024      Assessment:  ID: HCAP, ? UTI- afeb, wbc wnl, CKD stage 4, scr 2.61 improved (crcl~26)  Vanc 8/16>> --8/20: VT 28: dec to 1g/48h  Goal of Therapy:  Vancomycin trough level 15-20 mcg/ml  Plan:  Decrease to Vancomycin to 1g/48h  [name] [name] 07/25/2015,10:06 PM

## 2015-07-25 NOTE — Progress Notes (Signed)
ANTICOAGULATION CONSULT NOTE - Initial Consult  Pharmacy Consult for Coumadin Indication: atrial fibrillation  Allergies  Allergen Reactions  . Penicillins Rash    Happened 60 years ago; Tolerates Cephs    Patient Measurements: Height: 6' (182.9 cm) Weight: 215 lb 6.2 oz (97.7 kg) IBW/kg (Calculated) : 77.6  Vital Signs: Temp: 97.8 F (36.6 C) (08/20 0436) Temp Source: Axillary (08/20 0436) BP: 100/51 mmHg (08/20 0436) Pulse Rate: 120 (08/20 0436)  Labs:  Recent Labs  07/23/15 0353 07/24/15 1245 07/25/15 0230  HGB 8.2* 8.8* 8.7*  HCT 27.7* 29.0* 28.3*  PLT 244 227 221  LABPROT 39.5* 38.5* 38.1*  INR 4.21* 4.07* 4.02*  CREATININE 2.78* 2.69* 2.61*    Estimated Creatinine Clearance: 26.4 mL/min (by C-G formula based on Cr of 2.61).   Medical History: Past Medical History  Diagnosis Date  . Family history of malignant neoplasm of gastrointestinal tract   . Unspecified constipation   . Abnormal involuntary movements(781.0)   . Vitamin B12 deficiency   . Claudication   . Hypertrophy of prostate with urinary obstruction and other lower urinary tract symptoms (LUTS)   . Obesity   . Mixed hyperlipidemia   . Atrial fibrillation     --myoview 07/2006: not gated. No ischemia or infarct  Diastolic HF-- Echo 0/0174 ER60% mild AI/MR. RV mild to moderately dilated/ dysfunction. ? restrictive CM . RVSP 36  . Bladder polyps   . Kidney stones   . Unspecified sleep apnea     NPSG 05/14/2007- AHI 80.7/ hr  . OSA (obstructive sleep apnea)     "tried mask; couldn't use it; so I quit" (11/12/2014)  . Hypothyroidism   . History of hiatal hernia   . Daily headache     "I've had headaches all my life; get them almost daily" (11/12/2014)  . Stroke ~ 1985    "simple stroke" denies residual on 11/12/2014  . Arthritis     "just about qwhere"   . Pleural effusion 11/12/2014 admission  . Dysrhythmia     Afib  . Type II or unspecified type diabetes mellitus with neurological  manifestations, not stated as uncontrolled   . Shortness of breath dyspnea     walking distance or climbing stairs  . Neuromuscular disorder     Parkinson's / age 67 had growth per left shoulder/neck area that stopped blood flow to left arm was removed and has not no further problems.    Medications:  Coumadin 5mg  po daily except 2.5mg  on MWF per outpatient [facility] clinic note on 8/9 but wife said he's been taking 5mg  daily except 2.5mg  on Sun, Tues, and Fri  Assessment: 81 yo M admitted with HCAP on chronic Coumadin for Afib.  INR elevated on admission (3.19).  Last dose taken PTA 8/15.  Per outpatient flow sheet patient has been on current regimen for several months with mostly therapeutic INRs.  At most recent visit (07/14/15) INR was 3.5.  Dose was held x1 day.     TCTS plans to place a Pleurx catheter when INR less than 2.0 (tentative plan for Monday)  Significant events: - 8/17: L thoracentesis for pleural effusion  Today, 07/25/2015:  INR down to 4.02 with dose held on 8/19  hgb low but stable at 8.7, plt wnl  No bleeding documented  Goal of Therapy:  INR 2-3   Plan:  - continue to hold Coumadin - f/u TCTS plans for Pleurx catheter and when warfarin can be resumed s/p procedure - f/u with labs  in AM and resume coumadin s/p procedure when appropriate - monitor for s/s bleeding  Darl Pikes, PharmD Clinical Pharmacist- Resident Pager: [phone]   Darl Pikes 07/25/2015,7:58 AM

## 2015-07-25 NOTE — Progress Notes (Signed)
CRITICAL VALUE ALERT  Critical value received:  Vancomycin Tr 28  Date of notification:  07/25/2015  Time of notification:  21:50  Critical value read back:Yes.    Nurse who received alert:  Josie Dixon  MD notified (1st page):  L. Easterwood  Time of first page:  21:50  Responding MD:  Vivien Presto  Time MD responded:  22:00

## 2015-07-25 NOTE — Progress Notes (Signed)
Procedure(s) (LRB): INSERTION PLEURAL DRAINAGE CATHETER (Left) Subjective: CXR worse and INR still 4 Will give dose iv vit k and iv lasix, may need Kcentra Pt may need thoracentesis tomorrow as he is having difficulty breathing  Objective: Vital signs in last 24 hours: Temp:  [97.8 F (36.6 C)-98.6 F (37 C)] 98.3 F (36.8 C) (08/20 1653) Pulse Rate:  [54-131] 119 (08/20 1653) Cardiac Rhythm:  [-] Atrial fibrillation;Bundle branch block (08/20 0729) Resp:  [18-33] 24 (08/20 1653) BP: (100-121)/(51-69) 111/54 mmHg (08/20 1653) SpO2:  [95 %-100 %] 99 % (08/20 1653) Weight:  [215 lb 6.2 oz (97.7 kg)] 215 lb 6.2 oz (97.7 kg) (08/20 0436)  Hemodynamic parameters for last 24 hours:    Intake/Output from previous day: 08/19 0701 - 08/20 0700 In: 2225 [I.V.:1875; IV Piggyback:350] Out: 195 [Urine:825] Intake/Output this shift: Total I/O In: -  Out: 125 [Urine:125]  Responsive but somnolent A- fib Lab Results:  Recent Labs  07/24/15 1245 07/25/15 0230  WBC 7.0 8.0  HGB 8.8* 8.7*  HCT 29.0* 28.3*  PLT 227 221   BMET:  Recent Labs  07/24/15 1245 07/25/15 0230  NA 140 140  K 4.3 4.4  CL 104 107  CO2 28 25  GLUCOSE 165* 180*  BUN 77* 77*  CREATININE 2.69* 2.61*  CALCIUM 9.5 9.2    PT/INR:  Recent Labs  07/25/15 0230  LABPROT 38.1*  INR 4.02*   ABG    Component Value Date/Time   PHART 7.344* 07/12/2015 2110   HCO3 29.5* 07/09/2015 2110   TCO2 27.8 08/03/2015 2110   O2SAT 94.0 07/26/2015 2110   CBG (last 3)   Recent Labs  07/25/15 0816 07/25/15 1211 07/25/15 1650  GLUCAP 146* 205* 217*    Assessment/Plan: S/P Procedure(s) (LRB): INSERTION PLEURAL DRAINAGE CATHETER (Left) Recurrent L effusion Cytology negative from prior thoracentesis   LOS: 4 days    [name] [name] 07/25/2015

## 2015-07-25 NOTE — Progress Notes (Signed)
[place] TEAM 1 - Stepdown/ICU TEAM Progress Note  RISHITH SIDDOWAY DVV:[id number] DOB: 1931-02-26 DOA: 08/04/2015 PCP: Nyoka Cowden, MD  Admit HPI / Brief Narrative: 81 year old with mild COPD, obstructive sleep apnea, pulmonary hypertension, A. Fib, and recurrent right pleural effusion of unclear etiology (had a Pleurx catheter on the right side in the past) admitted 07/28/2015 with a large left pleural effusion and respiratory failure. He was admitted at the end of July 2016 for left lower lobe pneumonia and a urinary tract infection.   HPI/Subjective: The patient is somewhat confused today but does not appear to be in extremis.  He denies chest pain nausea vomiting or abdominal pain and admits to mild respiratory distress.  Assessment/Plan:  Acute hypoxic respiratory failure - recurrent large left pleural effusion TCTS plans to place a Pleurx catheter when INR less than 2.0 (tentative plan for Monday) - exudate per pleural studies - cultures w/o growth - continue empiric antibiotic - cytology from pleural fluid pending - repeat thoracentesis in interim if respiratory status declines - effusion is steadily worsening on CXR, but the pt is relatively clinically stable at this time - I will dose w/ vitamin K this evening in anticipation that pt will require a thoracentesis in the morning   Chronic Atrial fibrillation On chronic Coumadin therapy - Coumadin presently on hold for pending procedure - his rate is presently poorly controlled, which is likely in part due to his resp status - adjust medical therapy again  Hypothyroidism TSH was elevated 06/23/2015 but free T4 was normal - will not adjust therapy in setting of RVR   Chronic kidney disease Baseline creatinine appears to be approximately 2.5 - creatinine presently stable but not quite at baseline   Mild dysphagia Modified diet per SLP  Parkinson's disease Continue home medical therapy  DM 2 Reasonably controlled at this  time - no change in treatment plan  UTI should be covered with current broad-spectrum antibiotics - urine culture not helpful   MRSA Screen +  Code Status: NO CODE - DNR Family Communication: no family present at time of exam Disposition Plan: SDU  Consultants: Pulm TCTS  Procedures: 8/17 - left thoracentesis - 1 L bloody exudate  Antibiotics: Vancomycin 8/16 > Aztreonam 8/16 >  DVT prophylaxis: Warfarin - SCDs  Objective: Blood pressure 111/54, pulse 119, temperature 98.3 F (36.8 C), temperature source Axillary, resp. rate 24, height 6' (1.829 m), weight 97.7 kg (215 lb 6.2 oz), SpO2 99 %.  Intake/Output Summary (Last 24 hours) at 07/25/15 1656 Last data filed at 07/25/15 1654  Gross per 24 hour  Intake   2175 ml  Output    575 ml  Net   1600 ml   Exam: General: Mild respiratory distress but no extremis  Lungs: Poor air movement th/o L fields - CTA on R - no wheeze  Cardiovascular: Irregularly irregular and tachycardic with present rate approximately 110 bpm Abdomen: Nontender, nondistended, soft, bowel sounds positive, no rebound, no ascites, no appreciable mass Extremities: No significant cyanosis, clubbing, edema bilateral lower extremities  Data Reviewed: Basic Metabolic Panel:  Recent Labs Lab 07/30/2015 1758 07/22/15 0604 07/23/15 0353 07/24/15 1245 07/25/15 0230  NA 141 140 139 140 140  K 4.7 4.6 4.7 4.3 4.4  CL 104 103 102 104 107  CO2 30 30 28 28 25   GLUCOSE 134* 151* 106* 165* 180*  BUN 83* 84* 89* 77* 77*  CREATININE 2.75* 2.73* 2.78* 2.69* 2.61*  CALCIUM 10.5* 10.2 10.1 9.5 9.2  MG  --   --   --   --  2.2    CBC:  Recent Labs Lab 07/20/2015 1758 07/22/15 0604 07/23/15 0353 07/24/15 1245 07/25/15 0230  WBC 7.9 8.1 9.2 7.0 8.0  NEUTROABS 5.7 6.0  --   --   --   HGB 9.3* 9.4* 8.2* 8.8* 8.7*  HCT 31.4* 31.2* 27.7* 29.0* 28.3*  MCV 89.5 91.0 90.2 89.8 90.1  PLT 243 244 244 227 221    Liver Function Tests:  Recent Labs Lab  07/25/2015 1758 07/22/15 0604 07/25/15 0230  AST 16 13* 8*  ALT <5* 6* <5*  ALKPHOS 230* 238* 198*  BILITOT 0.4 0.5 0.4  PROT 7.4 7.5 6.7  ALBUMIN 2.9* 2.8* 2.2*   Coags:  Recent Labs Lab 07/11/2015 1758 07/22/15 0508 07/23/15 0353 07/24/15 1245 07/25/15 0230  INR 3.19* 3.37* 4.21* 4.07* 4.02*   Cardiac Enzymes:  Recent Labs Lab 08/03/2015 1758  TROPONINI <0.03    CBG:  Recent Labs Lab 07/24/15 1206 07/24/15 1601 07/24/15 2152 07/25/15 0816 07/25/15 1211  GLUCAP 163* 187* 145* 146* 205*    Recent Results (from the past 240 hour(s))  Culture, blood (x 2)     Status: None (Preliminary result)   Collection Time: 08/01/2015  8:28 PM  Result Value Ref Range Status   Specimen Description BLOOD LEFT HAND  Final   Special Requests BOTTLES DRAWN AEROBIC AND ANAEROBIC 5CC  Final   Culture   Final    NO GROWTH 3 DAYS Performed at [facility]    Report Status PENDING  Incomplete  Culture, blood (x 2)     Status: None (Preliminary result)   Collection Time: 08/04/2015  8:33 PM  Result Value Ref Range Status   Specimen Description BLOOD RIGHT HAND  Final   Special Requests BOTTLES DRAWN AEROBIC AND ANAEROBIC 5CC  Final   Culture   Final    NO GROWTH 3 DAYS Performed at [facility]    Report Status PENDING  Incomplete  Culture, body fluid-bottle     Status: None (Preliminary result)   Collection Time: 07/22/15 10:36 AM  Result Value Ref Range Status   Specimen Description FLUID PLEURAL  Final   Special Requests NONE  Final   Culture   Final    NO GROWTH 3 DAYS Performed at [facility]    Report Status PENDING  Incomplete  Gram stain     Status: None (Preliminary result)   Collection Time: 07/22/15 10:36 AM  Result Value Ref Range Status   Specimen Description FLUID PLEURAL  Final   Special Requests NONE  Final   Gram Stain   Final    MODERATE WBC PRESENT,BOTH PMN AND MONONUCLEAR NO ORGANISMS SEEN Performed at [facility]     Report Status PENDING  Incomplete  MRSA PCR Screening     Status: Abnormal   Collection Time: 07/22/15  3:17 PM  Result Value Ref Range Status   MRSA by PCR POSITIVE (A) NEGATIVE Final    Comment:        The GeneXpert MRSA Assay (FDA approved for NASAL specimens only), is one component of a comprehensive MRSA colonization surveillance program. It is not intended to diagnose MRSA infection nor to guide or monitor treatment for MRSA infections. RESULT CALLED TO, READ BACK BY AND VERIFIED WITH: JESSICA CONRAD,RN [id number] @ 51 BY J SCOTTON      Studies:   Recent x-ray studies have been reviewed in detail by  the Attending Physician  Scheduled Meds:  Scheduled Meds: . antiseptic oral rinse  7 mL Mouth Rinse q12n4p  . aztreonam  1 g Intravenous 3 times per day  . carbidopa-levodopa  1 tablet Oral TID  . [START ON 07/12/2015] cefUROXime (ZINACEF)  IV  1.5 g Intravenous Once  . chlorhexidine  15 mL Mouth Rinse BID  . Chlorhexidine Gluconate Cloth  6 each Topical Q0600  . darifenacin  7.5 mg Oral Daily  . gabapentin  100 mg Oral Q1200  . gabapentin  200 mg Oral QHS  . insulin aspart  0-9 Units Subcutaneous TID WC  . insulin glargine  25 Units Subcutaneous Q2200  . levothyroxine  100 mcg Oral QAC breakfast  . metoprolol succinate  50 mg Oral Daily  . mupirocin ointment  1 application Nasal BID  . pravastatin  20 mg Oral q1800  . sertraline  25 mg Oral Daily  . tamsulosin  0.4 mg Oral Daily  . vancomycin  1,000 mg Intravenous Q24H    Time spent on care of this patient: 35 mins   MCCLUNG,[name] T , MD   [facility] Office  [phone] Pager - Text Page per Shea Evans as per below:  On-Call/Text Page:      [email or web address]      password [email or web address]  If 7PM-7AM, please contact night-coverage [email or web address] Password [email or web address] 07/25/2015, 4:56 PM   LOS: 4 days

## 2015-07-26 ENCOUNTER — Inpatient Hospital Stay (HOSPITAL_COMMUNITY): Payer: Medicare Other

## 2015-07-26 DIAGNOSIS — J9601 Acute respiratory failure with hypoxia: Secondary | ICD-10-CM

## 2015-07-26 DIAGNOSIS — J948 Other specified pleural conditions: Secondary | ICD-10-CM

## 2015-07-26 DIAGNOSIS — J90 Pleural effusion, not elsewhere classified: Secondary | ICD-10-CM | POA: Diagnosis present

## 2015-07-26 DIAGNOSIS — E440 Moderate protein-calorie malnutrition: Secondary | ICD-10-CM | POA: Diagnosis present

## 2015-07-26 LAB — TYPE AND SCREEN
ABO/RH(D): O POS
Antibody Screen: NEGATIVE

## 2015-07-26 LAB — CBC
HCT: 29.5 % — ABNORMAL LOW (ref 39.0–52.0)
Hemoglobin: 8.9 g/dL — ABNORMAL LOW (ref 13.0–17.0)
MCH: 27.8 pg (ref 26.0–34.0)
MCHC: 30.2 g/dL (ref 30.0–36.0)
MCV: 92.2 fL (ref 78.0–100.0)
Platelets: 235 10*3/uL (ref 150–400)
RBC: 3.2 MIL/uL — ABNORMAL LOW (ref 4.22–5.81)
RDW: 16.2 % — ABNORMAL HIGH (ref 11.5–15.5)
WBC: 10.6 10*3/uL — ABNORMAL HIGH (ref 4.0–10.5)

## 2015-07-26 LAB — BASIC METABOLIC PANEL
ANION GAP: 8 (ref 5–15)
BUN: 83 mg/dL — ABNORMAL HIGH (ref 6–20)
CALCIUM: 9.6 mg/dL (ref 8.9–10.3)
CO2: 28 mmol/L (ref 22–32)
Chloride: 106 mmol/L (ref 101–111)
Creatinine, Ser: 2.74 mg/dL — ABNORMAL HIGH (ref 0.61–1.24)
GFR, EST AFRICAN AMERICAN: 23 mL/min — AB (ref >60)
GFR, EST NON AFRICAN AMERICAN: 20 mL/min — AB (ref >60)
Glucose, Bld: 189 mg/dL — ABNORMAL HIGH (ref 65–99)
POTASSIUM: 4.7 mmol/L (ref 3.5–5.1)
SODIUM: 142 mmol/L (ref 135–145)

## 2015-07-26 LAB — PROTIME-INR
INR: 2.23 — ABNORMAL HIGH (ref 0.00–1.49)
Prothrombin Time: 24.5 seconds — ABNORMAL HIGH (ref 11.6–15.2)

## 2015-07-26 LAB — GLUCOSE, CAPILLARY
GLUCOSE-CAPILLARY: 158 mg/dL — AB (ref 65–99)
Glucose-Capillary: 147 mg/dL — ABNORMAL HIGH (ref 65–99)
Glucose-Capillary: 132 mg/dL — ABNORMAL HIGH (ref 65–99)
Glucose-Capillary: 128 mg/dL — ABNORMAL HIGH (ref 65–99)

## 2015-07-26 LAB — ABO/RH: ABO/RH(D): O POS

## 2015-07-26 MED ORDER — HALOPERIDOL LACTATE 5 MG/ML IJ SOLN: 1.0000 mg | Freq: Four times a day (QID) | INTRAMUSCULAR | Status: DC | PRN

## 2015-07-26 MED ORDER — DEXTROSE-NACL 5-0.9 % IV SOLN
INTRAVENOUS | Status: DC
  Administered 2015-07-26: 30 mL/h via INTRAVENOUS
  Administered 2015-07-28: 23:00:00 via INTRAVENOUS

## 2015-07-26 MED ORDER — INSULIN GLARGINE 100 UNIT/ML ~~LOC~~ SOLN
10.0000 [IU] | Freq: Every day | SUBCUTANEOUS | Status: DC
  Administered 2015-07-26 – 2015-07-28 (×3): 10 [IU] via SUBCUTANEOUS
  Filled 2015-07-26 (×4): qty 0.1

## 2015-07-26 MED ORDER — FUROSEMIDE 10 MG/ML IJ SOLN
40.0000 mg | Freq: Once | INTRAMUSCULAR | Status: AC
  Administered 2015-07-26: 40 mg via INTRAVENOUS
  Filled 2015-07-26: qty 4

## 2015-07-26 MED ORDER — VITAMIN K1 10 MG/ML IJ SOLN
5.0000 mg | Freq: Once | INTRAVENOUS | Status: AC
  Administered 2015-07-26: 5 mg via INTRAVENOUS
  Filled 2015-07-26: qty 0.5

## 2015-07-26 MED ORDER — LEVOTHYROXINE SODIUM 100 MCG IV SOLR
50.0000 ug | Freq: Every day | INTRAVENOUS | Status: DC
  Administered 2015-07-26: 50 ug via INTRAVENOUS
  Filled 2015-07-26 (×2): qty 5

## 2015-07-26 NOTE — Progress Notes (Signed)
Procedure(s) (LRB): INSERTION PLEURAL DRAINAGE CATHETER (Left) Subjective: Events noted-patient needed emergent left thoracentesis. Post thoracentesis chest x-ray shows residual effusion but improved aeration. Because of such rapid reaccumulation of bloody fluid, tomorrow in the OR I will plan on placing a large bore chest tube for optimal immediate drainage as well as a left Pleurx catheter for chronic drainage of the left pleural space. This was discussed with the patient.  Objective: Vital signs in last 24 hours: Temp:  [96.4 F (35.8 C)-98.3 F (36.8 C)] 96.4 F (35.8 C) (08/21 0752) Pulse Rate:  [54-128] 106 (08/21 0748) Cardiac Rhythm:  [-] Atrial fibrillation (08/21 0757) Resp:  [18-25] 21 (08/21 0748) BP: (99-117)/(50-72) 104/72 mmHg (08/21 0752) SpO2:  [92 %-99 %] 96 % (08/21 0748) Weight:  [217 lb 9.5 oz (98.7 kg)] 217 lb 9.5 oz (98.7 kg) (08/21 0411)  Hemodynamic parameters for last 24 hours:   atrial fibrillation  Intake/Output from previous day: 08/20 0701 - 08/21 0700 In: 1745.8 [P.O.:450; I.V.:1095.8; IV Piggyback:200] Out: 425 [Urine:425] Intake/Output this shift: Total I/O In: -  Out: 16 [Urine:50]  Chronically ill 81 year old male with acute respiratory difficulty from heart failure, atrial fibrillation, and recurrent large bloody left effusion which on initial cytology was negative for malignancy  Lab Results:  Recent Labs  07/25/15 0230 07/26/15 0250  WBC 8.0 10.6*  HGB 8.7* 8.9*  HCT 28.3* 29.5*  PLT 221 235   BMET:  Recent Labs  07/25/15 0230 07/26/15 0250  NA 140 142  K 4.4 4.7  CL 107 106  CO2 25 28  GLUCOSE 180* 189*  BUN 77* 83*  CREATININE 2.61* 2.74*  CALCIUM 9.2 9.6    PT/INR:  Recent Labs  07/26/15 0250  LABPROT 24.5*  INR 2.23*   ABG    Component Value Date/Time   PHART 7.344* 07/28/2015 2110   HCO3 29.5* 08/01/2015 2110   TCO2 27.8 08/02/2015 2110   O2SAT 94.0 07/30/2015 2110   CBG (last 3)   Recent Labs  07/25/15 1650 07/25/15 2146 07/26/15 0749  GLUCAP 217* 215* 158*    Assessment/Plan: S/P Procedure(s) (LRB): INSERTION PLEURAL DRAINAGE CATHETER (Left) Plan left chest tube and left Pleurx catheter placement in OR tomorrow.   LOS: 5 days    Tharon Aquas Trigt III 07/26/2015

## 2015-07-26 NOTE — Procedures (Addendum)
Thoracentesis Procedure Note  Pre-operative Diagnosis: Large left pleural effusion causing respiratory distress  Post-operative Diagnosis: Large hemothorax  Indications: Respiratory distress  Procedure Details  Consent: Informed consent was obtained. Risks of the procedure were discussed including: infection, bleeding, pain, pneumothorax.  Ultrasound was used to identify the best pocket of fluid in the left chest, then skin was then marked and cleaned with chlorhexadine.  Under sterile conditions the patient was positioned. Betadine solution and sterile drapes were utilized.  1% buffered lidocaine was used to anesthetize the 8th rib space.  Upon entry with the finder needle it was noted that the fluid was profoundly bloody.  Care was taken to ensure the thoracentesis needle passed just over the rib in the intercostal space.  Fluid was obtained without any difficulties and minimal blood loss.  A dressing was applied to the wound and wound care instructions were provided.   Findings 1500 ml of very bloody pleural fluid was obtained.   Complications:  None; patient tolerated the procedure well.          Condition: stable  Plan A follow up chest x-ray was ordered. Bed Rest for 0 hours. Tylenol 650 mg. for pain.  Attending Attestation: I performed the procedure.  Roselie Awkward, MD [facility] Pager: [phone] Cell: [phone] After 3pm or if no response, call [phone]

## 2015-07-26 NOTE — Progress Notes (Signed)
[facility] TEAM 1 - Stepdown/ICU TEAM Progress Note  [name] [name] UMP:[id number] DOB: [birth year] DOA: 07/07/2015 PCP: Nyoka Cowden, MD  Admit HPI / Brief Narrative: 81 year old with mild COPD, obstructive sleep apnea, pulmonary hypertension, A. Fib, and recurrent right pleural effusion of unclear etiology (had a Pleurx catheter on the right side in the past) admitted 07/18/2015 with a large left pleural effusion and respiratory failure. He was admitted at the end of July 2016 for left lower lobe pneumonia and a urinary tract infection.   HPI/Subjective: The patient is lethargic today.  When he is awoken he is delirious and unable to provide a reliable hx.  He is taking rapid shallow breaths.  He does not appear uncomfortable or anxious.    Assessment/Plan:  Acute hypoxic respiratory failure - recurrent large left pleural effusion (parapneumonic) TCTS plans to place a Pleurx catheter when INR less than 2.0 (tentative plan for Monday) - exudate per pleural studies - cultures w/o growth - continue empiric antibiotic - cytology from pleural fluid unrevealing - repeat thoracentesis will be required today - pt was dosed w/ vitamin K 10mg  total yesterday in anticipation of need for thora today - I have alerted PCCM to need for thora today  Hx of idiopathic recurrent R pleural effusion (likely due to R heart failure) S/p pleurex Jan 2016 (removed March 2016)  Chronic Atrial fibrillation On chronic Coumadin therapy - Coumadin presently on hold for pending procedures, and pt reversed w/ vitamin K - his rate is now better controlled w/ IV BB dosing - follow   Right Heart Failure - Pulm HTN Dilated RV and reduced RV systolic fxn on TTE Dec 4008, w/ severely dilated RA (PA pressure 55mm Hg)  OSA  Did not tolerate CPAP   Mild COPD - former smoker  Hypothyroidism TSH was elevated 06/23/2015 but free T4 was normal - follow  Chronic kidney disease stage 4 Baseline creatinine appears to be  approximately 2.5 - creatinine presently stable but not quite at baseline   Mild dysphagia Modified diet per SLP, but due to delirium will need to be npo today   Parkinson's disease Continue home medical therapy as able   DM 2 Reasonably controlled at this time - no change in treatment plan  UTI should be covered with current broad-spectrum antibiotics - urine culture not helpful   Chronic foley cath  In place since Dec 2015  Hx B12 deficiency  B12 3K July 2016  MRSA Screen +  Code Status: NO CODE - DNR Family Communication: no family present at time of exam Disposition Plan: SDU  Consultants: Pulm TCTS  Procedures: 8/17 - left thoracentesis - 1 L bloody exudate  Antibiotics: Vancomycin 8/16 > Aztreonam 8/16 >  DVT prophylaxis: Warfarin - SCDs  Objective: Blood pressure 104/72, pulse 106, temperature 96.4 F (35.8 C), temperature source Axillary, resp. rate 21, height 6' (1.829 m), weight 98.7 kg (217 lb 9.5 oz), SpO2 96 %.  Intake/Output Summary (Last 24 hours) at 07/26/15 0826 Last data filed at 07/26/15 0755  Gross per 24 hour  Intake 1535.83 ml  Output    475 ml  Net 1060.83 ml   Exam: General: rapid shallow breathing - sats >95% - pt lethargic   Lungs: No appreciable air movement on L - CTA on R - no wheeze  Cardiovascular: Irregularly irregular and tachycardic with present rate approximately 90 bpm Abdomen: Nontender, nondistended, soft, bowel sounds positive, no rebound, no ascites, no appreciable mass Extremities: No significant cyanosis, clubbing,  edema bilateral lower extremities  Data Reviewed: Basic Metabolic Panel:  Recent Labs Lab 07/22/15 0604 07/23/15 0353 07/24/15 1245 07/25/15 0230 07/26/15 0250  NA 140 139 140 140 142  K 4.6 4.7 4.3 4.4 4.7  CL 103 102 104 107 106  CO2 30 28 28 25 28   GLUCOSE 151* 106* 165* 180* 189*  BUN 84* 89* 77* 77* 83*  CREATININE 2.73* 2.78* 2.69* 2.61* 2.74*  CALCIUM 10.2 10.1 9.5 9.2 9.6  MG  --    --   --  2.2  --     CBC:  Recent Labs Lab 07/09/2015 1758 07/22/15 0604 07/23/15 0353 07/24/15 1245 07/25/15 0230 07/26/15 0250  WBC 7.9 8.1 9.2 7.0 8.0 10.6*  NEUTROABS 5.7 6.0  --   --   --   --   HGB 9.3* 9.4* 8.2* 8.8* 8.7* 8.9*  HCT 31.4* 31.2* 27.7* 29.0* 28.3* 29.5*  MCV 89.5 91.0 90.2 89.8 90.1 92.2  PLT 243 244 244 227 221 235    Liver Function Tests:  Recent Labs Lab 07/24/2015 1758 07/22/15 0604 07/25/15 0230  AST 16 13* 8*  ALT <5* 6* <5*  ALKPHOS 230* 238* 198*  BILITOT 0.4 0.5 0.4  PROT 7.4 7.5 6.7  ALBUMIN 2.9* 2.8* 2.2*   Coags:  Recent Labs Lab 07/22/15 0508 07/23/15 0353 07/24/15 1245 07/25/15 0230 07/26/15 0250  INR 3.37* 4.21* 4.07* 4.02* 2.23*   Cardiac Enzymes:  Recent Labs Lab 07/20/2015 1758  TROPONINI <0.03    CBG:  Recent Labs Lab 07/25/15 0816 07/25/15 1211 07/25/15 1650 07/25/15 2146 07/26/15 0749  GLUCAP 146* 205* 217* 215* 158*    Recent Results (from the past 240 hour(s))  Culture, blood (x 2)     Status: None (Preliminary result)   Collection Time: 07/19/2015  8:28 PM  Result Value Ref Range Status   Specimen Description BLOOD LEFT HAND  Final   Special Requests BOTTLES DRAWN AEROBIC AND ANAEROBIC 5CC  Final   Culture   Final    NO GROWTH 3 DAYS Performed at [facility]    Report Status PENDING  Incomplete  Culture, blood (x 2)     Status: None (Preliminary result)   Collection Time: 07/13/2015  8:33 PM  Result Value Ref Range Status   Specimen Description BLOOD RIGHT HAND  Final   Special Requests BOTTLES DRAWN AEROBIC AND ANAEROBIC 5CC  Final   Culture   Final    NO GROWTH 3 DAYS Performed at [facility]    Report Status PENDING  Incomplete  Culture, body fluid-bottle     Status: None (Preliminary result)   Collection Time: 07/22/15 10:36 AM  Result Value Ref Range Status   Specimen Description FLUID PLEURAL  Final   Special Requests NONE  Final   Culture   Final    NO GROWTH 3  DAYS Performed at [facility]    Report Status PENDING  Incomplete  Gram stain     Status: None (Preliminary result)   Collection Time: 07/22/15 10:36 AM  Result Value Ref Range Status   Specimen Description FLUID PLEURAL  Final   Special Requests NONE  Final   Gram Stain   Final    MODERATE WBC PRESENT,BOTH PMN AND MONONUCLEAR NO ORGANISMS SEEN Performed at [facility]    Report Status PENDING  Incomplete  MRSA PCR Screening     Status: Abnormal   Collection Time: 07/22/15  3:17 PM  Result Value Ref Range Status  MRSA by PCR POSITIVE (A) NEGATIVE Final    Comment:        The GeneXpert MRSA Assay (FDA approved for NASAL specimens only), is one component of a comprehensive MRSA colonization surveillance program. It is not intended to diagnose MRSA infection nor to guide or monitor treatment for MRSA infections. RESULT CALLED TO, READ BACK BY AND VERIFIED WITH: JESSICA CONRAD,RN [id number] @ 7782 BY J SCOTTON      Studies:   Recent x-ray studies have been reviewed in detail by the Attending Physician  Scheduled Meds:  Scheduled Meds: . antiseptic oral rinse  7 mL Mouth Rinse q12n4p  . aztreonam  1 g Intravenous 3 times per day  . carbidopa-levodopa  1 tablet Oral TID  . [START ON 07/20/2015] cefUROXime (ZINACEF)  IV  1.5 g Intravenous Once  . chlorhexidine  15 mL Mouth Rinse BID  . Chlorhexidine Gluconate Cloth  6 each Topical Q0600  . darifenacin  7.5 mg Oral Daily  . gabapentin  100 mg Oral Q1200  . gabapentin  200 mg Oral QHS  . insulin aspart  0-9 Units Subcutaneous TID WC  . insulin glargine  30 Units Subcutaneous Q2200  . levothyroxine  100 mcg Oral QAC breakfast  . metoprolol  10 mg Intravenous 4 times per day  . mupirocin ointment  1 application Nasal BID  . pravastatin  20 mg Oral q1800  . sertraline  25 mg Oral Daily  . tamsulosin  0.4 mg Oral Daily  . vancomycin  1,000 mg Intravenous Q48H    Time spent on care of this patient: 35  mins   MCCLUNG,[name] T , MD   [facility] Office  [phone] Pager - Text Page per Shea Evans as per below:  On-Call/Text Page:      [email or web address]      password [email or web address]  If 7PM-7AM, please contact night-coverage [email or web address] Password [email or web address] 07/26/2015, 8:26 AM   LOS: 5 days

## 2015-07-27 ENCOUNTER — Inpatient Hospital Stay (HOSPITAL_COMMUNITY): Payer: Medicare Other

## 2015-07-27 ENCOUNTER — Inpatient Hospital Stay (HOSPITAL_COMMUNITY): Payer: Medicare Other | Admitting: Certified Registered"

## 2015-07-27 ENCOUNTER — Encounter (HOSPITAL_COMMUNITY): Admission: EM | Disposition: E | Payer: Self-pay | Source: Home / Self Care | Attending: Internal Medicine

## 2015-07-27 ENCOUNTER — Encounter (HOSPITAL_COMMUNITY): Payer: Self-pay | Admitting: Certified Registered"

## 2015-07-27 HISTORY — PX: CHEST TUBE INSERTION: SHX231

## 2015-07-27 LAB — CBC
HCT: 28.3 % — ABNORMAL LOW (ref 39.0–52.0)
Hemoglobin: 8.5 g/dL — ABNORMAL LOW (ref 13.0–17.0)
MCH: 27.3 pg (ref 26.0–34.0)
MCHC: 30 g/dL (ref 30.0–36.0)
MCV: 91 fL (ref 78.0–100.0)
Platelets: 237 10*3/uL (ref 150–400)
RBC: 3.11 MIL/uL — ABNORMAL LOW (ref 4.22–5.81)
RDW: 16.7 % — ABNORMAL HIGH (ref 11.5–15.5)
WBC: 10 10*3/uL (ref 4.0–10.5)

## 2015-07-27 LAB — PROTIME-INR
INR: 1.44 (ref 0.00–1.49)
PROTHROMBIN TIME: 17.6 s — AB (ref 11.6–15.2)

## 2015-07-27 LAB — GLUCOSE, CAPILLARY
GLUCOSE-CAPILLARY: 110 mg/dL — AB (ref 65–99)
GLUCOSE-CAPILLARY: 174 mg/dL — AB (ref 65–99)
GLUCOSE-CAPILLARY: 176 mg/dL — AB (ref 65–99)
Glucose-Capillary: 149 mg/dL — ABNORMAL HIGH (ref 65–99)

## 2015-07-27 LAB — CULTURE, BODY FLUID W GRAM STAIN -BOTTLE

## 2015-07-27 LAB — COMPREHENSIVE METABOLIC PANEL
ALBUMIN: 2.1 g/dL — AB (ref 3.5–5.0)
ALT: <5 U/L — ABNORMAL LOW (ref 17–63)
ANION GAP: 9 (ref 5–15)
AST: 7 U/L — AB (ref 15–41)
Alkaline Phosphatase: 166 U/L — ABNORMAL HIGH (ref 38–126)
BUN: 94 mg/dL — AB (ref 6–20)
CHLORIDE: 107 mmol/L (ref 101–111)
CO2: 23 mmol/L (ref 22–32)
Calcium: 9 mg/dL (ref 8.9–10.3)
Creatinine, Ser: 3.04 mg/dL — ABNORMAL HIGH (ref 0.61–1.24)
GFR calc Af Amer: 21 mL/min — ABNORMAL LOW (ref >60)
GFR calc non Af Amer: 18 mL/min — ABNORMAL LOW (ref >60)
GLUCOSE: 167 mg/dL — AB (ref 65–99)
POTASSIUM: 4.6 mmol/L (ref 3.5–5.1)
SODIUM: 139 mmol/L (ref 135–145)
Total Bilirubin: 0.6 mg/dL (ref 0.3–1.2)
Total Protein: 6.5 g/dL (ref 6.5–8.1)

## 2015-07-27 LAB — CULTURE, BLOOD (ROUTINE X 2)
Culture: NO GROWTH
Culture: NO GROWTH

## 2015-07-27 LAB — CULTURE, BODY FLUID-BOTTLE: CULTURE: NO GROWTH

## 2015-07-27 SURGERY — INSERTION, PLEURAL DRAINAGE CATHETER
Anesthesia: Monitor Anesthesia Care | Site: Chest | Laterality: Left

## 2015-07-27 MED ORDER — LIDOCAINE HCL 1 % IJ SOLN
INTRAMUSCULAR | Status: DC | PRN
  Administered 2015-07-27: 30 mL via INTRADERMAL

## 2015-07-27 MED ORDER — ONDANSETRON HCL 4 MG/2ML IJ SOLN: 4.0000 mg | Freq: Once | INTRAMUSCULAR | Status: DC | PRN

## 2015-07-27 MED ORDER — DEXMEDETOMIDINE BOLUS VIA INFUSION
INTRAVENOUS | Status: DC | PRN
  Administered 2015-07-27 (×6): 4 ug via INTRAVENOUS

## 2015-07-27 MED ORDER — LEVOTHYROXINE SODIUM 100 MCG PO TABS
100.0000 ug | ORAL_TABLET | Freq: Every day | ORAL | Status: DC
  Administered 2015-07-28: 100 ug via ORAL
  Filled 2015-07-27 (×3): qty 1

## 2015-07-27 MED ORDER — LIDOCAINE HCL (PF) 1 % IJ SOLN
INTRAMUSCULAR | Status: AC
  Filled 2015-07-27: qty 30

## 2015-07-27 MED ORDER — MIDAZOLAM HCL 2 MG/2ML IJ SOLN
INTRAMUSCULAR | Status: AC
  Filled 2015-07-27: qty 4

## 2015-07-27 MED ORDER — SODIUM CHLORIDE 0.9 % IJ SOLN
INTRAMUSCULAR | Status: AC
  Filled 2015-07-27: qty 10

## 2015-07-27 MED ORDER — MORPHINE SULFATE (PF) 2 MG/ML IV SOLN
1.0000 mg | INTRAVENOUS | Status: DC | PRN
  Administered 2015-07-28 – 2015-07-29 (×4): 2 mg via INTRAVENOUS
  Filled 2015-07-27 (×5): qty 1

## 2015-07-27 MED ORDER — PHENYLEPHRINE 40 MCG/ML (10ML) SYRINGE FOR IV PUSH (FOR BLOOD PRESSURE SUPPORT)
PREFILLED_SYRINGE | INTRAVENOUS | Status: AC
  Filled 2015-07-27: qty 10

## 2015-07-27 MED ORDER — PROPOFOL 10 MG/ML IV BOLUS
INTRAVENOUS | Status: AC
  Filled 2015-07-27: qty 20

## 2015-07-27 MED ORDER — PHENYLEPHRINE HCL 10 MG/ML IJ SOLN
INTRAMUSCULAR | Status: DC | PRN
  Administered 2015-07-27: 40 ug via INTRAVENOUS
  Administered 2015-07-27: 120 ug via INTRAVENOUS
  Administered 2015-07-27 (×3): 80 ug via INTRAVENOUS

## 2015-07-27 MED ORDER — FENTANYL CITRATE (PF) 100 MCG/2ML IJ SOLN: 25.0000 ug | INTRAMUSCULAR | Status: DC | PRN

## 2015-07-27 MED ORDER — ALBUMIN HUMAN 5 % IV SOLN
INTRAVENOUS | Status: DC | PRN
  Administered 2015-07-27: 08:00:00 via INTRAVENOUS

## 2015-07-27 MED ORDER — 0.9 % SODIUM CHLORIDE (POUR BTL) OPTIME
TOPICAL | Status: DC | PRN
  Administered 2015-07-27: 1000 mL

## 2015-07-27 MED ORDER — FENTANYL CITRATE (PF) 250 MCG/5ML IJ SOLN
INTRAMUSCULAR | Status: AC
Start: 2015-07-27 — End: 2015-07-27
  Filled 2015-07-27: qty 5

## 2015-07-27 MED ORDER — HYDROCODONE-ACETAMINOPHEN 5-325 MG PO TABS
1.0000 | ORAL_TABLET | ORAL | Status: DC | PRN
  Administered 2015-07-27 – 2015-07-28 (×6): 1 via ORAL
  Filled 2015-07-27 (×6): qty 1

## 2015-07-27 MED ORDER — LACTATED RINGERS IV SOLN
INTRAVENOUS | Status: DC | PRN
  Administered 2015-07-27: 07:00:00 via INTRAVENOUS

## 2015-07-27 MED ORDER — EPHEDRINE SULFATE 50 MG/ML IJ SOLN
INTRAMUSCULAR | Status: AC
  Filled 2015-07-27: qty 1

## 2015-07-27 SURGICAL SUPPLY — 33 items
ADH SKN CLS APL DERMABOND .7 (GAUZE/BANDAGES/DRESSINGS) ×1
CANISTER SUCTION 2500CC (MISCELLANEOUS) ×3 IMPLANT
COVER SURGICAL LIGHT HANDLE (MISCELLANEOUS) ×3 IMPLANT
DERMABOND ADVANCED (GAUZE/BANDAGES/DRESSINGS) ×2
DERMABOND ADVANCED .7 DNX12 (GAUZE/BANDAGES/DRESSINGS) ×1 IMPLANT
DRAPE C-ARM 42X72 X-RAY (DRAPES) ×3 IMPLANT
DRAPE LAPAROSCOPIC ABDOMINAL (DRAPES) ×3 IMPLANT
DRSG TEGADERM 4X4.75 (GAUZE/BANDAGES/DRESSINGS) ×2 IMPLANT
GAUZE SPONGE 2X2 8PLY STRL LF (GAUZE/BANDAGES/DRESSINGS) IMPLANT
GLOVE BIO SURGEON STRL SZ7.5 (GLOVE) ×6 IMPLANT
GOWN STRL REUS W/ TWL LRG LVL3 (GOWN DISPOSABLE) ×2 IMPLANT
GOWN STRL REUS W/TWL LRG LVL3 (GOWN DISPOSABLE) ×6
KIT BASIN OR (CUSTOM PROCEDURE TRAY) ×3 IMPLANT
KIT PLEURX DRAIN CATH 1000ML (MISCELLANEOUS) ×3 IMPLANT
KIT PLEURX DRAIN CATH 15.5FR (DRAIN) ×3 IMPLANT
KIT ROOM TURNOVER OR (KITS) ×3 IMPLANT
NDL HYPO 25GX1X1/2 BEV (NEEDLE) ×1 IMPLANT
NEEDLE HYPO 25GX1X1/2 BEV (NEEDLE) ×3 IMPLANT
NS IRRIG 1000ML POUR BTL (IV SOLUTION) ×3 IMPLANT
PACK GENERAL/GYN (CUSTOM PROCEDURE TRAY) ×3 IMPLANT
PAD ARMBOARD 7.5X6 YLW CONV (MISCELLANEOUS) ×6 IMPLANT
SET DRAINAGE LINE (MISCELLANEOUS) IMPLANT
SPONGE GAUZE 2X2 STER 10/PKG (GAUZE/BANDAGES/DRESSINGS) ×4
SPONGE GAUZE 4X4 12PLY STER LF (GAUZE/BANDAGES/DRESSINGS) ×2 IMPLANT
SUT ETHILON 3 0 PS 1 (SUTURE) ×3 IMPLANT
SUT SILK 2 0 SH (SUTURE) ×3 IMPLANT
SUT VIC AB 3-0 SH 8-18 (SUTURE) ×3 IMPLANT
SYR CONTROL 10ML LL (SYRINGE) ×3 IMPLANT
TAPE CLOTH SURG 4X10 WHT LF (GAUZE/BANDAGES/DRESSINGS) ×2 IMPLANT
TOWEL OR 17X24 6PK STRL BLUE (TOWEL DISPOSABLE) ×3 IMPLANT
TOWEL OR 17X26 10 PK STRL BLUE (TOWEL DISPOSABLE) ×3 IMPLANT
VALVE REPLACEMENT CAP (MISCELLANEOUS) IMPLANT
WATER STERILE IRR 1000ML POUR (IV SOLUTION) ×3 IMPLANT

## 2015-07-27 NOTE — Anesthesia Preprocedure Evaluation (Addendum)
Anesthesia Evaluation  Patient identified by MRN, date of birth, ID bandGeneral Assessment Comment:Pt very lethargic. Son and daughter at bedside and report that pt is more tired than usual because he didn't sleep last night. He is arousable and can say his name and birthday.  Reviewed: Allergy & Precautions, NPO status , Patient's Chart, lab work & pertinent test results, reviewed documented beta blocker date and time   Airway Mallampati: II  TM Distance: >3 FB Neck ROM: Full    Dental  (+) Dental Advisory Given, Loose, Missing,    Pulmonary shortness of breath, sleep apnea , pneumonia -, COPDformer smoker,  breath sounds clear to auscultation        Cardiovascular + dysrhythmias Atrial Fibrillation Rhythm:Irregular Rate:Normal     Neuro/Psych  Headaches,  Neuromuscular disease CVA    GI/Hepatic hiatal hernia,   Endo/Other  diabetes, Type 2Hypothyroidism   Renal/GU Renal InsufficiencyRenal disease     Musculoskeletal   Abdominal   Peds  Hematology  (+) anemia ,   Anesthesia Other Findings   Reproductive/Obstetrics                           Anesthesia Physical Anesthesia Plan  ASA: III  Anesthesia Plan: MAC   Post-op Pain Management:    Induction: Intravenous  Airway Management Planned: Natural Airway and Simple Face Mask  Additional Equipment:   Intra-op Plan:   Post-operative Plan:   Informed Consent: I have reviewed the patients History and Physical, chart, labs and discussed the procedure including the risks, benefits and alternatives for the proposed anesthesia with the patient or authorized representative who has indicated his/her understanding and acceptance.     Plan Discussed with: CRNA and Anesthesiologist  Anesthesia Plan Comments:         Anesthesia Quick Evaluation

## 2015-07-27 NOTE — Progress Notes (Signed)
The patient was examined and preop studies reviewed. There has been no change from the prior exam and the patient is ready for surgery.   Plan Left pleurx catheter and possible left chest tube on J Crail

## 2015-07-27 NOTE — Anesthesia Postprocedure Evaluation (Signed)
  Anesthesia Post-op Note  Patient: [name] [name]  Procedure(s) Performed: Procedure(s): INSERTION PLEURAL DRAINAGE CATHETER (Left)  Patient Location: PACU  Anesthesia Type:MAC  Level of Consciousness: awake, sedated and lethargic  Airway and Oxygen Therapy: Patient Spontanous Breathing and Patient connected to nasal cannula oxygen  Post-op Pain: mild  Post-op Assessment: Post-op Vital signs reviewed, Patient's Cardiovascular Status Stable, Respiratory Function Stable and Patent Airway              Post-op Vital Signs: stable  Last Vitals:  Filed Vitals:   07/07/2015 0935  BP:   Pulse:   Temp: 36.1 C  Resp:     Complications: No apparent anesthesia complications

## 2015-07-27 NOTE — Progress Notes (Signed)
UR COMPLETED  

## 2015-07-27 NOTE — Op Note (Signed)
NAMEJARRIUS, HUARACHA NO.:  [id number]  MEDICAL RECORD NO.:  [id number]  LOCATION:  3S12C                        FACILITY:  [facility]  PHYSICIAN:  Ivin Poot, M.D.  DATE OF BIRTH:  1931-11-22  DATE OF PROCEDURE:  07/31/2015 DATE OF DISCHARGE:                              OPERATIVE REPORT   OPERATION:  Placement of left PleurX catheter.  SURGEON:  Ivin Poot, M.D.  PREOPERATIVE DIAGNOSIS:  Recurrent bloody left pleural effusion.  POSTOPERATIVE DIAGNOSIS:  Recurrent bloody left pleural effusion.  ANESTHESIA:  Local 1% lidocaine with IV conscious sedation directed by Dr. Linna Caprice.  DESCRIPTION OF PROCEDURE:  The patient was explained the procedure of left PleurX catheter for treatment of recurrent left effusion, which has required recent multiple thoracenteses.  Informed consent was obtained. The patient was brought directly to the operating room and placed supine on the operating table with left side gently elevated under a soft blanket.  The left chest was prepped and draped as a sterile field.  IV conscious sedation was monitored by Anesthesia.  A proper time-out was performed.  1% local lidocaine was infiltrated in 2 areas in the fifth interspace at the anterior axillary line and at the left costal margin. Small incisions were made in each area with local anesthesia.  The left pleural space was aspirated with a needle sheath and the needle was withdrawn and the sheath was inserted.  Through this sheath, the guidewire was passed.  The sheath was withdrawn and fluoroscopy documented the guidewire to be in the dependent portion of the left pleural space.  The catheter was then tunneled from the lower incision to the upper incision.  Over the guide wire, the dilator and dilator sheath system was passed.  The catheter was trimmed to the appropriate length and passed through the sheath, which was torn away as the catheter was inserted.  The catheter was  then connected to vacuum bottles and drained a total of 2.5 L of serosanguineous fluid.  The upper incision was closed with interrupted subcutaneous Vicryl and interrupted nylon for the skin.  The lower incision was closed with a silk suture, which was used to secure the catheter to the skin.  Sterile dressings were applied.  A chest x-ray was taken in the OR room, which showed complete evacuation of the left pleural effusion and re-expansion of the left lung without pneumothorax.    Ivin Poot, M.D.    PV/MEDQ  D:  07/13/2015  T:  07/07/2015  Job:  [id number]

## 2015-07-27 NOTE — Progress Notes (Signed)
Pt transported from PACU with Rn an tech on monitor. Pt sats 95% changed from face mask to nasal canula at 6L. Pt complains of no pain and is resting. Family at bedside and updated. Will continue to monitor.

## 2015-07-27 NOTE — Transfer of Care (Signed)
Immediate Anesthesia Transfer of Care Note  Patient: [name] [name]  Procedure(s) Performed: Procedure(s): INSERTION PLEURAL DRAINAGE CATHETER (Left)  Patient Location: PACU  Anesthesia Type:MAC  Level of Consciousness: oriented, sedated and lethargic  Airway & Oxygen Therapy: Patient Spontanous Breathing and Patient connected to face mask oxygen  Post-op Assessment: Report given to RN, Post -op Vital signs reviewed and stable and Patient moving all extremities X 4  Post vital signs: Reviewed and stable  Last Vitals:  Filed Vitals:   07/31/2015 0330  BP: 103/55  Pulse:   Temp:   Resp: 21    Complications: No apparent anesthesia complications

## 2015-07-27 NOTE — Brief Op Note (Signed)
07/14/2015 - 07/26/2015  8:43 AM  PATIENT:  [name] [name]  81 y.o. male  PRE-OPERATIVE DIAGNOSIS:  LARGE LEFT PLEURAL EFFUSION  POST-OPERATIVE DIAGNOSIS:  LARGE LEFT PLEURAL EFFUSION  PROCEDURE:  Procedure(s): INSERTION PLEURAL DRAINAGE CATHETER (Left) Drained 2.5 L bloody pleural effusion Fluid sent for cytology  SURGEON:  Surgeon(s) and Role:    * Ivin Poot, MD - Primary  PHYSICIAN ASSISTANT: -     ANESTHESIA:   local and IV sedation  EBL:  Total I/O In: 650 [I.V.:400; IV Piggyback:250] Out: -   BLOOD ADMINISTERED:none  DRAINS: none   LOCAL MEDICATIONS USED:  LIDOCAINE  and Amount: 8 ml  SPECIMEN:  Aspirate  DISPOSITION OF SPECIMEN:  PATHOLOGY  COUNTS:  YES  TOURNIQUET:  * No tourniquets in log *  DICTATION: .Dragon Dictation  PLAN OF CARE: Admit to inpatient    Postop CXR shows re-expansion of L lung , no effusion  PATIENT DISPOSITION:  PACU - hemodynamically stable.   Delay start of Pharmacological VTE agent (>24hrs) due to surgical blood loss or risk of bleeding: yes NO COUMADIN FOR 7 DAYS

## 2015-07-27 NOTE — Care Management Important Message (Addendum)
Important Message  Patient Details  Name: [name] [name] MRN: [id number] Date of Birth: 07-31-1931   Medicare Important Message Given:  Yes-third notification given    Sharin Mons, RN 07/26/2015, 3:19 PM

## 2015-07-27 NOTE — Progress Notes (Signed)
[facility] TEAM 1 - Stepdown/ICU TEAM Progress Note  GOTTI ALWIN XLK:[id number] DOB: 12-26-1930 DOA: 07/06/2015 PCP: Nyoka Cowden, MD  Admit HPI / Brief Narrative: 81 year old with mild COPD, obstructive sleep apnea, pulmonary hypertension, A. Fib, and recurrent right pleural effusion of unclear etiology (had a Pleurx catheter on the right side in the past) admitted 08/04/2015 with a large left pleural effusion and respiratory failure. He was admitted at the end of July 2016 for left lower lobe pneumonia and a urinary tract infection.   HPI/Subjective: The patient is confused but much more alert today.  He appears to be much more stable from a respiratory standpoint.  He is not able to ride a reliable history at this time given his confusion.  Assessment/Plan:  Acute hypoxic respiratory failure - recurrent large left pleural effusion (parapneumonic) exudate per prior pleural studies - cultures w/o growth x 5 days - continue empiric antibiotic - cytology from pleural fluid unrevealing - repeat thoracentesis required 8/21- TCTS drained the space and placed a Pleurx catheter today - f/u CXR dramatically improved   Hx of idiopathic recurrent R pleural effusion (likely due to R heart failure) S/p pleurex Jan 2016 (removed March 2016) - only minimal effusion on R on serial CXRs  Chronic Atrial fibrillation On chronic Coumadin therapy - Coumadin presently on hold and pt reversed w/ 15mg  total vitamin K - his rate is now better controlled w/ IV BB dosing - pt appears to be a poor candidate for ongoing anticoag given bleeding into his pleural space, and regardless of decision should remain off anticoagulation until 7 days postop at minimum  Right Heart Failure - Pulm HTN Dilated RV and reduced RV systolic fxn on TTE Dec 3664, w/ severely dilated RA (PA pressure 51mm Hg)  OSA  Did not tolerate CPAP   Mild COPD - former smoker  Hypothyroidism TSH was elevated 06/23/2015 but free T4  was normal - follow  Chronic kidney disease stage 4 Baseline creatinine appears to be approximately 2.5 - creatinine presently stable but not quite at baseline   Mild dysphagia Modified diet per SLP  Parkinson's disease Continue home medical therapy as able   DM 2 Reasonably controlled at this time   UTI should be covered with current broad-spectrum antibiotics - urine culture not helpful   Chronic foley cath  In place since Dec 2015  Hx B12 deficiency  B12 3K July 2016  MRSA Screen +  Code Status: NO CODE - DNR Family Communication: Spoke with daughter at bedside Disposition Plan: SDU  Consultants: Pulm TCTS  Procedures: 8/17 - left thoracentesis - 1 L bloody exudate 8/22 - Pleurx catheter left  Antibiotics: Vancomycin 8/16 > Aztreonam 8/16 >  DVT prophylaxis: Warfarin - SCDs  Objective: Blood pressure 93/54, pulse 110, temperature 97.6 F (36.4 C), temperature source Axillary, resp. rate 28, height 6' (1.829 m), weight 96.4 kg (212 lb 8.4 oz), SpO2 92 %.  Intake/Output Summary (Last 24 hours) at 08/05/2015 1645 Last data filed at 07/31/2015 1600  Gross per 24 hour  Intake   1624 ml  Output    725 ml  Net    899 ml   Exam: General: Respirations much more comfortable - patient alert but delirious Lungs: Markedly improved air movement on left with no wheeze  Cardiovascular: Irregularly irregular  Abdomen: Nontender, nondistended, soft, bowel sounds positive, no rebound, no ascites, no appreciable mass Extremities: No significant cyanosis, clubbing, or edema bilateral lower extremities  Data Reviewed: Basic Metabolic Panel:  Recent Labs Lab 07/23/15 0353 07/24/15 1245 07/25/15 0230 07/26/15 0250 07/23/2015 0446  NA 139 140 140 142 139  K 4.7 4.3 4.4 4.7 4.6  CL 102 104 107 106 107  CO2 28 28 25 28 23   GLUCOSE 106* 165* 180* 189* 167*  BUN 89* 77* 77* 83* 94*  CREATININE 2.78* 2.69* 2.61* 2.74* 3.04*  CALCIUM 10.1 9.5 9.2 9.6 9.0  MG  --   --   2.2  --   --     CBC:  Recent Labs Lab 07/12/2015 1758 07/22/15 0604 07/23/15 0353 07/24/15 1245 07/25/15 0230 07/26/15 0250 07/26/2015 0446  WBC 7.9 8.1 9.2 7.0 8.0 10.6* 10.0  NEUTROABS 5.7 6.0  --   --   --   --   --   HGB 9.3* 9.4* 8.2* 8.8* 8.7* 8.9* 8.5*  HCT 31.4* 31.2* 27.7* 29.0* 28.3* 29.5* 28.3*  MCV 89.5 91.0 90.2 89.8 90.1 92.2 91.0  PLT 243 244 244 227 221 235 237    Liver Function Tests:  Recent Labs Lab 07/20/2015 1758 07/22/15 0604 07/25/15 0230 08/03/2015 0446  AST 16 13* 8* 7*  ALT <5* 6* <5* <5*  ALKPHOS 230* 238* 198* 166*  BILITOT 0.4 0.5 0.4 0.6  PROT 7.4 7.5 6.7 6.5  ALBUMIN 2.9* 2.8* 2.2* 2.1*   Coags:  Recent Labs Lab 07/23/15 0353 07/24/15 1245 07/25/15 0230 07/26/15 0250 07/21/2015 0446  INR 4.21* 4.07* 4.02* 2.23* 1.44   Cardiac Enzymes:  Recent Labs Lab 07/30/2015 1758  TROPONINI <0.03    CBG:  Recent Labs Lab 07/26/15 1228 07/26/15 1614 07/26/15 2132 07/22/2015 0850 07/20/2015 1057  GLUCAP 147* 132* 128* 149* 110*    Recent Results (from the past 240 hour(s))  Culture, blood (x 2)     Status: None   Collection Time: 08/01/2015  8:28 PM  Result Value Ref Range Status   Specimen Description BLOOD LEFT HAND  Final   Special Requests BOTTLES DRAWN AEROBIC AND ANAEROBIC 5CC  Final   Culture   Final    NO GROWTH 5 DAYS Performed at [facility]    Report Status 07/06/2015 FINAL  Final  Culture, blood (x 2)     Status: None   Collection Time: 07/11/2015  8:33 PM  Result Value Ref Range Status   Specimen Description BLOOD RIGHT HAND  Final   Special Requests BOTTLES DRAWN AEROBIC AND ANAEROBIC 5CC  Final   Culture   Final    NO GROWTH 5 DAYS Performed at [facility]    Report Status 07/09/2015 FINAL  Final  Culture, body fluid-bottle     Status: None   Collection Time: 07/22/15 10:36 AM  Result Value Ref Range Status   Specimen Description FLUID PLEURAL  Final   Special Requests NONE  Final   Culture    Final    NO GROWTH 5 DAYS Performed at [facility]    Report Status 08/03/2015 FINAL  Final  Gram stain     Status: None (Preliminary result)   Collection Time: 07/22/15 10:36 AM  Result Value Ref Range Status   Specimen Description FLUID PLEURAL  Final   Special Requests NONE  Final   Gram Stain   Final    MODERATE WBC PRESENT,BOTH PMN AND MONONUCLEAR NO ORGANISMS SEEN Performed at [facility]    Report Status PENDING  Incomplete  MRSA PCR Screening     Status: Abnormal   Collection Time: 07/22/15  3:17 PM  Result  Value Ref Range Status   MRSA by PCR POSITIVE (A) NEGATIVE Final    Comment:        The GeneXpert MRSA Assay (FDA approved for NASAL specimens only), is one component of a comprehensive MRSA colonization surveillance program. It is not intended to diagnose MRSA infection nor to guide or monitor treatment for MRSA infections. RESULT CALLED TO, READ BACK BY AND VERIFIED WITH: JESSICA CONRAD,RN [id number] @ 4827 BY J SCOTTON      Studies:   Recent x-ray studies have been reviewed in detail by the Attending Physician  Scheduled Meds:  Scheduled Meds: . antiseptic oral rinse  7 mL Mouth Rinse q12n4p  . aztreonam  1 g Intravenous 3 times per day  . carbidopa-levodopa  1 tablet Oral TID  . chlorhexidine  15 mL Mouth Rinse BID  . insulin aspart  0-9 Units Subcutaneous TID WC  . insulin glargine  10 Units Subcutaneous Q2200  . levothyroxine  50 mcg Intravenous Daily  . metoprolol  10 mg Intravenous 4 times per day  . mupirocin ointment  1 application Nasal BID  . vancomycin  1,000 mg Intravenous Q48H    Time spent on care of this patient: 25 mins   Derak Schurman T , MD   [facility] Office  [phone] Pager - Text Page per Shea Evans as per below:  On-Call/Text Page:      [email or web address]      password [email or web address]  If 7PM-7AM, please contact night-coverage [email or web address] Password [email or web address] 08/03/2015, 4:45 PM   LOS: 6 days

## 2015-07-28 ENCOUNTER — Inpatient Hospital Stay (HOSPITAL_COMMUNITY): Payer: Medicare Other

## 2015-07-28 ENCOUNTER — Encounter (HOSPITAL_COMMUNITY): Payer: Self-pay | Admitting: Cardiothoracic Surgery

## 2015-07-28 DIAGNOSIS — I480 Paroxysmal atrial fibrillation: Secondary | ICD-10-CM

## 2015-07-28 DIAGNOSIS — I50810 Right heart failure, unspecified: Secondary | ICD-10-CM | POA: Diagnosis present

## 2015-07-28 DIAGNOSIS — E538 Deficiency of other specified B group vitamins: Secondary | ICD-10-CM

## 2015-07-28 DIAGNOSIS — N3000 Acute cystitis without hematuria: Secondary | ICD-10-CM | POA: Diagnosis present

## 2015-07-28 DIAGNOSIS — N184 Chronic kidney disease, stage 4 (severe): Secondary | ICD-10-CM

## 2015-07-28 DIAGNOSIS — I272 Pulmonary hypertension, unspecified: Secondary | ICD-10-CM | POA: Diagnosis present

## 2015-07-28 DIAGNOSIS — G20 Parkinson's disease: Secondary | ICD-10-CM

## 2015-07-28 DIAGNOSIS — E038 Other specified hypothyroidism: Secondary | ICD-10-CM

## 2015-07-28 DIAGNOSIS — Z7189 Other specified counseling: Secondary | ICD-10-CM

## 2015-07-28 DIAGNOSIS — I509 Heart failure, unspecified: Secondary | ICD-10-CM

## 2015-07-28 DIAGNOSIS — I270 Primary pulmonary hypertension: Secondary | ICD-10-CM

## 2015-07-28 DIAGNOSIS — G4733 Obstructive sleep apnea (adult) (pediatric): Secondary | ICD-10-CM

## 2015-07-28 DIAGNOSIS — J90 Pleural effusion, not elsewhere classified: Secondary | ICD-10-CM

## 2015-07-28 DIAGNOSIS — R1310 Dysphagia, unspecified: Secondary | ICD-10-CM

## 2015-07-28 LAB — BASIC METABOLIC PANEL
Anion gap: 6 (ref 5–15)
BUN: 100 mg/dL — ABNORMAL HIGH (ref 6–20)
CO2: 25 mmol/L (ref 22–32)
Calcium: 9 mg/dL (ref 8.9–10.3)
Chloride: 110 mmol/L (ref 101–111)
Creatinine, Ser: 3.09 mg/dL — ABNORMAL HIGH (ref 0.61–1.24)
GFR calc Af Amer: 20 mL/min — ABNORMAL LOW (ref >60)
GFR calc non Af Amer: 17 mL/min — ABNORMAL LOW (ref >60)
Glucose, Bld: 168 mg/dL — ABNORMAL HIGH (ref 65–99)
Potassium: 4.6 mmol/L (ref 3.5–5.1)
Sodium: 141 mmol/L (ref 135–145)

## 2015-07-28 LAB — GLUCOSE, CAPILLARY
GLUCOSE-CAPILLARY: 161 mg/dL — AB (ref 65–99)
GLUCOSE-CAPILLARY: 149 mg/dL — AB (ref 65–99)
Glucose-Capillary: 150 mg/dL — ABNORMAL HIGH (ref 65–99)
Glucose-Capillary: 142 mg/dL — ABNORMAL HIGH (ref 65–99)

## 2015-07-28 LAB — CBC
HCT: 27.9 % — ABNORMAL LOW (ref 39.0–52.0)
Hemoglobin: 8.6 g/dL — ABNORMAL LOW (ref 13.0–17.0)
MCH: 27.7 pg (ref 26.0–34.0)
MCHC: 30.8 g/dL (ref 30.0–36.0)
MCV: 89.7 fL (ref 78.0–100.0)
Platelets: 239 10*3/uL (ref 150–400)
RBC: 3.11 MIL/uL — ABNORMAL LOW (ref 4.22–5.81)
RDW: 16.7 % — ABNORMAL HIGH (ref 11.5–15.5)
WBC: 8.4 10*3/uL (ref 4.0–10.5)

## 2015-07-28 MED ORDER — SCOPOLAMINE 1 MG/3DAYS TD PT72: 1.0000 | MEDICATED_PATCH | TRANSDERMAL | Status: DC | PRN

## 2015-07-28 MED ORDER — LEVALBUTEROL HCL 0.63 MG/3ML IN NEBU
0.6300 mg | INHALATION_SOLUTION | RESPIRATORY_TRACT | Status: DC | PRN
  Administered 2015-07-28: 0.63 mg via RESPIRATORY_TRACT
  Filled 2015-07-28: qty 3

## 2015-07-28 MED ORDER — LORAZEPAM 2 MG/ML IJ SOLN
0.5000 mg | INTRAMUSCULAR | Status: DC | PRN
  Administered 2015-07-29: 1 mg via INTRAVENOUS
  Filled 2015-07-28: qty 1

## 2015-07-28 NOTE — Assessment & Plan Note (Signed)
Ventricular response feels controlled at this visit

## 2015-07-28 NOTE — Progress Notes (Signed)
Speech Language Pathology Treatment: Dysphagia  Patient Details Name: [name] [name] MRN: [id number] DOB: [birth year] Today's Date: 07/28/2015 Time: 9622-2979 SLP Time Calculation (min) (ACUTE ONLY): 25 min  Assessment / Plan / Recommendation Clinical Impression  Per RN and wife, pt is taking minimal po. Congested breath sounds noted, and pt is unable to demonstrate ability to cough and clear his airway. Pt reports significant pain, and refused being seated upright and po trials. RN had given pain meds (morphine and vicodin), which seemed to be taking effect during SLP visit. Oral care would be beneficial to moisten oral cavity, however, pt with increasing lethargy due to pain meds, and SLP did not want to interfere with pain management. Will change diet to dys 1 with nectar thick liquids for energy conservation.   Recommend consideration of Palliative Care consult to establish goals of care. Discussed pt status and recommendations with RN. ST to continue to monitor diet tolerance and continue education.   HPI Other Pertinent Information: 80 yo male adm to [name] with acute respiratory failure with hypoxia.  Pt PMH + for Parkinson's disease, neuropathy, SOB, B12 deficiency, CKD.  Pt also with recurrent pleural effusions - required pleurix catheter 1/16-3/16.   Seen in June 2016 for BSE with recommendationfor regular/thin diet-  coughing some with/without intake noted.  Swallow evaluation ordered currently.    Pertinent Vitals Pain Assessment: Faces Faces Pain Scale: Hurts whole lot Pain Location: "everywhere" Pain Intervention(s): Repositioned;Patient requesting pain meds-RN notified  SLP Plan  Continue with current plan of care    Recommendations Diet recommendations: Dysphagia 1 (puree);Nectar-thick liquid Liquids provided via: Teaspoon Medication Administration: Crushed with puree Supervision: Full supervision/cueing for compensatory strategies;Staff to assist with self  feeding Compensations: Small sips/bites;Slow rate Postural Changes and/or Swallow Maneuvers: Seated upright 90 degrees             Oral Care Recommendations: Oral care QID Follow up Recommendations: 24 hour supervision/assistance Plan: Continue with current plan of care    GO   [name] [name] B. [name] [name] [name] [name], CCC-SLP [phone]  [name] [name] 07/28/2015, 4:25 PM

## 2015-07-28 NOTE — Care Management Note (Signed)
Case Management Note  Patient Details  Name: [name] [name] MRN: [id number] Date of Birth: 07-09-1931  If discussed at Long Length of Stay Meetings, dates discussed:  07/28/15  Additional Comments:  Sharin Mons, RN 07/28/2015, 11:57 AM

## 2015-07-28 NOTE — Care Management Note (Signed)
Case Management Note  Patient Details  Name: [name] [name] MRN: [id number] Date of Birth: 02-May-1931   If discussed at Long Length of Stay Meetings, dates discussed:  07/28/15  Additional Comments:  Sharin Mons, RN 07/28/2015, 11:37 AM

## 2015-07-28 NOTE — Progress Notes (Signed)
[place] TEAM 1 - Stepdown/ICU TEAM Progress Note  [name] [name] WUG:[id number] DOB: 14-Aug-1931 DOA: 07/22/2015 PCP: Nyoka Cowden, MD  Admit HPI / Brief Narrative: 81 year old WM PMHx COPD, obstructive sleep apnea, pulmonary hypertension, A. Fib, and recurrent right pleural effusion of unclear etiology (had a Pleurx catheter on the right side in the past).  Admitted 07/18/2015 with a large left pleural effusion and respiratory failure. He was admitted at the end of July 2016 for left lower lobe pneumonia and a urinary tract infection.   HPI/Subjective: 8/23 sleepy, difficult to arouse, states negative pain/SOB  Assessment/Plan: Acute hypoxic respiratory failure - recurrent large left pleural effusion (parapneumonic) -exudate per prior pleural studies - cultures w/o growth x 5 days - continue empiric antibiotic - cytology from pleural fluid unrevealing - repeat thoracentesis required 8/21- TCTS drained the space and placed a Pleurx catheter today - f/u CXR dramatically improved  -Per wife patient with anxiety/pain secondary to effusion. Her major concern is patient's comfort. After long discussion Ms. Scripter understands will be a balancing act between his comfort and climbing BP  Hx of idiopathic recurrent R pleural effusion (likely due to R heart failure) S/p pleurex Jan 2016 (removed March 2016) - only minimal effusion on R on serial CXRs  Chronic Atrial fibrillation -On chronic Coumadin therapy - Coumadin presently on hold secondary to ongoing bleeding -Currently sinus tachycardia  Right Heart Failure - Pulm HTN -Dilated RV and reduced RV systolic fxn on TTE Dec 8882, w/ severely dilated RA (PA pressure 49mm Hg)  OSA  Did not tolerate CPAP   Mild COPD - former smoker  Hypothyroidism -TSH was elevated 06/23/2015 but free T4 was normal - follow  Chronic kidney disease stage 4 (Baseline Cr~2.5)  - Cr trending up monitor closely   Mild dysphagia -Modified diet per  SLP; patient currently unable to consume meals  Parkinson's disease Continue home medical therapy as able   DM 2 Reasonably controlled at this time   UTI -Completed 5 day course of antibiotics    Chronic foley cath  In place since Dec 2015  Hx B12 deficiency  B12 3K July 2016  Goals of care -After long conversation with patient's wife she is leaning toward some form of hospice would like to speak with our palliative care team. -Consult for palliative care placed   Code Status: FULL Family Communication: no family present at time of exam Disposition Plan: Per palliative care discussion    Consultants: Pulm TCTS   Procedure/Significant Events: 8/17 - left thoracentesis - 1 L bloody exudate 8/22 - Pleurx catheter left   Culture   Antibiotics: Vancomycin 8/16 > stopped 8/23 Aztreonam 8/16 > stopped 8/23     DVT prophylaxis: Warfarin/SCD   Devices    LINES / TUBES:      Continuous Infusions: . dextrose 5 % and 0.9% NaCl 10 mL/hr at 07/28/15 2257    Objective: VITAL SIGNS: Temp: 97.5 F (36.4 C) (08/23 2035) Temp Source: Axillary (08/23 2035) BP: 105/65 mmHg (08/23 1200) Pulse Rate: 108 (08/23 1200) SPO2; FIO2:   Intake/Output Summary (Last 24 hours) at 07/28/15 2320 Last data filed at 07/28/15 1645  Gross per 24 hour  Intake    290 ml  Output    900 ml  Net   -610 ml     Exam: General: Sleepy, difficult to arouse, appears comfortable, No acute respiratory distress Eyes: negative scleral hemorrhage ENT: Negative Runny nose, negative gingival bleeding, Neck:  Negative scars, masses, torticollis, lymphadenopathy,  JVD Lungs: Clear to auscultation on left, positive crackles decreased breath sounds on left Pleurx cath in place Cardiovascular: Tachycardic, Regular rhythm without murmur gallop or rub normal S1 and S2 Abdomen:negative abdominal pain, negative dysphagia, nondistended, positive soft, bowel sounds, no rebound, no ascites, no  appreciable mass Extremities: No significant cyanosis, clubbing, or edema bilateral lower extremities Psychiatric:  Unable to assess Neurologic:  Unable to assess   Data Reviewed: Basic Metabolic Panel:  Recent Labs Lab 07/24/15 1245 07/25/15 0230 07/26/15 0250 07/18/2015 0446 07/28/15 0313  NA 140 140 142 139 141  K 4.3 4.4 4.7 4.6 4.6  CL 104 107 106 107 110  CO2 28 25 28 23 25   GLUCOSE 165* 180* 189* 167* 168*  BUN 77* 77* 83* 94* 100*  CREATININE 2.69* 2.61* 2.74* 3.04* 3.09*  CALCIUM 9.5 9.2 9.6 9.0 9.0  MG  --  2.2  --   --   --    Liver Function Tests:  Recent Labs Lab 07/22/15 0604 07/25/15 0230 07/18/2015 0446  AST 13* 8* 7*  ALT 6* <5* <5*  ALKPHOS 238* 198* 166*  BILITOT 0.5 0.4 0.6  PROT 7.5 6.7 6.5  ALBUMIN 2.8* 2.2* 2.1*   No results for input(s): LIPASE, AMYLASE in the last 168 hours. No results for input(s): AMMONIA in the last 168 hours. CBC:  Recent Labs Lab 07/22/15 0604  07/24/15 1245 07/25/15 0230 07/26/15 0250 07/26/2015 0446 07/28/15 0313  WBC 8.1  < > 7.0 8.0 10.6* 10.0 8.4  NEUTROABS 6.0  --   --   --   --   --   --   HGB 9.4*  < > 8.8* 8.7* 8.9* 8.5* 8.6*  HCT 31.2*  < > 29.0* 28.3* 29.5* 28.3* 27.9*  MCV 91.0  < > 89.8 90.1 92.2 91.0 89.7  PLT 244  < > 227 221 235 237 239  < > = values in this interval not displayed. Cardiac Enzymes: No results for input(s): CKTOTAL, CKMB, CKMBINDEX, TROPONINI in the last 168 hours. BNP (last 3 results)  Recent Labs  11/29/14 0820 07/31/2015 1759  BNP 48.4 54.4    ProBNP (last 3 results)  Recent Labs  11/12/14 0941  PROBNP 440.4    CBG:  Recent Labs Lab 07/12/2015 2128 07/28/15 0758 07/28/15 1159 07/28/15 1548 07/28/15 2136  GLUCAP 176* 161* 149* 150* 142*    Recent Results (from the past 240 hour(s))  Culture, blood (x 2)     Status: None   Collection Time: 07/14/2015  8:28 PM  Result Value Ref Range Status   Specimen Description BLOOD LEFT HAND  Final   Special Requests  BOTTLES DRAWN AEROBIC AND ANAEROBIC 5CC  Final   Culture   Final    NO GROWTH 5 DAYS Performed at [facility]    Report Status 07/14/2015 FINAL  Final  Culture, blood (x 2)     Status: None   Collection Time: 07/08/2015  8:33 PM  Result Value Ref Range Status   Specimen Description BLOOD RIGHT HAND  Final   Special Requests BOTTLES DRAWN AEROBIC AND ANAEROBIC 5CC  Final   Culture   Final    NO GROWTH 5 DAYS Performed at [facility]    Report Status 07/18/2015 FINAL  Final  Culture, body fluid-bottle     Status: None   Collection Time: 07/22/15 10:36 AM  Result Value Ref Range Status   Specimen Description FLUID PLEURAL  Final   Special Requests NONE  Final  Culture   Final    NO GROWTH 5 DAYS Performed at [facility]    Report Status 07/14/2015 FINAL  Final  Gram stain     Status: None (Preliminary result)   Collection Time: 07/22/15 10:36 AM  Result Value Ref Range Status   Specimen Description FLUID PLEURAL  Final   Special Requests NONE  Final   Gram Stain   Final    MODERATE WBC PRESENT,BOTH PMN AND MONONUCLEAR NO ORGANISMS SEEN Performed at [facility]    Report Status PENDING  Incomplete  MRSA PCR Screening     Status: Abnormal   Collection Time: 07/22/15  3:17 PM  Result Value Ref Range Status   MRSA by PCR POSITIVE (A) NEGATIVE Final    Comment:        The GeneXpert MRSA Assay (FDA approved for NASAL specimens only), is one component of a comprehensive MRSA colonization surveillance program. It is not intended to diagnose MRSA infection nor to guide or monitor treatment for MRSA infections. RESULT CALLED TO, READ BACK BY AND VERIFIED WITH: JESSICA CONRAD,RN [id number] @ 37 BY J SCOTTON      Studies:  Recent x-ray studies have been reviewed in detail by the Attending Physician  Scheduled Meds:  Scheduled Meds: . antiseptic oral rinse  7 mL Mouth Rinse q12n4p  . carbidopa-levodopa  1 tablet Oral TID  . chlorhexidine   15 mL Mouth Rinse BID  . insulin aspart  0-9 Units Subcutaneous TID WC  . insulin glargine  10 Units Subcutaneous Q2200  . levothyroxine  100 mcg Oral QAC breakfast  . metoprolol  10 mg Intravenous 4 times per day    Time spent on care of this patient: 40 mins   Luian Schumpert, Geraldo Docker , MD  [facility] Office  [phone] Pager - [phone]  On-Call/Text Page:      [email or web address]      password [email or web address]  If 7PM-7AM, please contact night-coverage [email or web address] Password [email or web address] 07/28/2015, 11:20 PM   LOS: 7 days   Care during the described time interval was provided by me .  I have reviewed this patient's available data, including medical history, events of note, physical examination, and all test results as part of my evaluation. I have personally reviewed and interpreted all radiology studies.   Dia Crawford, MD [phone] Pager

## 2015-07-28 NOTE — Progress Notes (Signed)
PCCM PROGRESS NOTE  ADMISSION DATE: 07/20/2015 CONSULT DATE: 07/23/2015 REFERRING PROVIDER: [facility]  CC: Short of breath  SUBJECTIVE: Daughter is concerned that he will need oxygen at home.  OBJECTIVE: Temp:  [95.9 F (35.5 C)-97.7 F (36.5 C)] 97.6 F (36.4 C) (08/23 0332) Pulse Rate:  [41-122] 117 (08/23 0400) Resp:  [17-34] 18 (08/23 0400) BP: (81-107)/(43-62) 98/55 mmHg (08/23 0400) SpO2:  [87 %-99 %] 94 % (08/23 0400)   General: ill appearing HEENT: no sinus tenderness Cardiac: irregular Chest: decreased BS at bases, Lt pleurx in place Abd: soft, non tender Ext: 1+ edema Neuro: follows commands Skin: no rashes   CMP Latest Ref Rng 07/28/2015 07/06/2015 07/26/2015  Glucose 65 - 99 mg/dL 168(H) 167(H) 189(H)  BUN 6 - 20 mg/dL 100(H) 94(H) 83(H)  Creatinine 0.61 - 1.24 mg/dL 3.09(H) 3.04(H) 2.74(H)  Sodium 135 - 145 mmol/L 141 139 142  Potassium 3.5 - 5.1 mmol/L 4.6 4.6 4.7  Chloride 101 - 111 mmol/L 110 107 106  CO2 22 - 32 mmol/L 25 23 28   Calcium 8.9 - 10.3 mg/dL 9.0 9.0 9.6  Total Protein 6.5 - 8.1 g/dL - 6.5 -  Total Bilirubin 0.3 - 1.2 mg/dL - 0.6 -  Alkaline Phos 38 - 126 U/L - 166(H) -  AST 15 - 41 U/L - 7(L) -  ALT 17 - 63 U/L - <5(L) -     CBC Latest Ref Rng 07/28/2015 07/06/2015 07/26/2015  WBC 4.0 - 10.5 K/uL 8.4 10.0 10.6(H)  Hemoglobin 13.0 - 17.0 g/dL 8.6(L) 8.5(L) 8.9(L)  Hematocrit 39.0 - 52.0 % 27.9(L) 28.3(L) 29.5(L)  Platelets 150 - 400 K/uL 239 237 235     Dg Chest Portable 1 View  08/01/2015   CLINICAL DATA:  Postop.  Chest tube.  EXAM: PORTABLE CHEST - 1 VIEW  COMPARISON:  07/26/2015.  FINDINGS: There has been placement of a left basilar chest tube with tip over the posterior medial left base. There has been significant improvement to near complete resolution of the previously noted opacification of the left hemi thorax. No evidence of left pneumothorax. Minimal residual left basilar opacification with blunting of the costophrenic angle likely  mild residual left pleural fluid/atelectasis. Mild stable opacification of the right mid to lower lung. There is mild cardiomegaly as well as calcified plaque over the aortic arch. Remainder of the exam is unchanged.  IMPRESSION: Near complete resolution of left hemithorax opacification after placement of left-sided chest tube with tip over the posterior medial base. Suggestion of minimal residual left basilar pleural fluid/atelectasis.  Mild cardiomegaly.   Electronically Signed   By: Marin Olp M.D.   On: 07/28/2015 09:43   Dg Chest Port 1 View  07/26/2015   CLINICAL DATA:  Post bedside thoracentesis for a large left pleural effusion.  EXAM: PORTABLE CHEST - 1 VIEW  COMPARISON:  Portable chest x-ray earlier same date 0639 hr and 0628 hr.  FINDINGS: Slight interval decrease in size of the left pleural effusion after thoracentesis, though there remains near complete opacification of the left hemithorax. Air bronchograms in the lingula and left lower lobe are now visible. No evidence of pneumothorax.  Moderate size right pleural effusion, passive atelectasis in the right lower lobe, and interstitial pulmonary edema in the right lung are unchanged.  IMPRESSION: 1. Interval slight decrease in size of the left pleural effusion after thoracentesis, though a large left effusion persists with near complete opacification of the left hemithorax. No evidence of pneumothorax. 2. Stable right pleural effusion,  passive atelectasis in the right lower lobe and interstitial pulmonary edema in the right lung.   Electronically Signed   By: Evangeline Dakin M.D.   On: 07/26/2015 11:40   Dg C-arm 1-60 Min-no Report  07/30/2015   CLINICAL DATA: Placement of pleurex catheter   C-ARM 1-60 MINUTES  Fluoroscopy was utilized by the requesting physician.  No radiographic  interpretation.     STUDIES: 8/17 Lt thoracentesis >> 1 liter bloody fluid, protein 5.2, LDH 234, glucose 126,  WBC 747 (62%M), reactive mesothelial cells 8/17  CT chest >> large Lt effusion, mod Rt effusion, multiple Rt pulmonary nodules 8/21 Lt thoracentesis >> 1.5 liter bloody fluid  EVENTS: 8/16 Admit 8/18 Speech therapy assessment; transfer from [facility] to [facility] 8/19 TCTS consulted 8/22 Lt pleurx catheter  DISCUSSION: 81 yo male former smoker presented with dyspnea, hypoxia, fatigue, and possible UTI.  He was found to have Lt pleural effusion and was tx for PNA one month prior to this admission. He has hx of Rt pleural effusion s/p pleurx from January 2016 to March 2016. Hx of A fib, Parkinson Disease, CKD, Hypothyroidism, DM type II, COPD, OSA, Pulmonary HTN  ASSESSMENT/PLAN:  Acute hypoxic respiratory failure 2nd to Lt pleural effusion after episode of PNA in July 2016, now s/p pleurx 8/22. Plan: - f/u CXR - oxygen to keep SpO2 > 90% - family states that they are willing to pay for home oxygen set up if not covered by insurance - pleurx per TCTS - day 8 of Abx, currently on vancomycin, aztreonam  Rt lung nodules ?metastatic lesions. Plan: - f/u pleural fluid cytology from 8/22 - conservative management likely best option  Hx of COPD. Plan: - prn xopenex  Dysphagia with concern for recurrent aspiration. Plan: - D3 diet - f/u with speech therapy  A fib, Parkinson's Disease, CKD, Hypothyroidism DM, Anemia. Plan: - per primary team  Goals of Care. Plan: - DNR/DNI  Updated pt's daughter at bedside.  PCCM will sign off.  Please call if further help is needed.  Chesley Mires, MD [facility] Pulmonary/Critical Care 07/28/2015, 8:17 AM Pager:  [phone] After 3pm call: [phone]

## 2015-07-28 NOTE — Assessment & Plan Note (Signed)
Has not stayed with treatment efforts. Reminded to try to sleep off flat of back.

## 2015-07-28 NOTE — Progress Notes (Addendum)
Pleurex drain vacu-containers on order. Arrived, drained 523ml serosang, re-dressed site.

## 2015-07-28 NOTE — Assessment & Plan Note (Signed)
This seemed to develop with pneumonia 06/2015 and could be either exudate or transudate. With significant history of CHF it may be a combined exudate/transudate. We will give him more time to reabsorb this. Plan-incentive spirometer, encourage walking and mobilization

## 2015-07-28 NOTE — Care Management Note (Signed)
Case Management Note  Patient Details  Name: [name] [name] MRN: [id number] Date of Birth: 12/03/1931  Subjective/Objective:                 Recurrent L pleural effusion, s/p pleurex drain   Action/Plan: Return to home when medically stable. CM to f/u with d/c disposition.  Expected Discharge Date:   (unknown)               Expected Discharge Plan:  [place]  In-House Referral:     Discharge planning Services  CM Consult  Post Acute Care Choice:    Choice offered to:  Spouse (active with [facility])  DME Arranged:    DME Agency:     HH Arranged:  RN, PT [place] Agency:     Status of Service:  In process, will continue to follow  Medicare Important Message Given:  Medicare IM give by:    Date Additional Medicare IM Given:    Additional Medicare Important Message give by:     If discussed at [place] of Stay Meetings, dates discussed:    Additional Comments:Gail Moeser (Spouse) [phone], Cathan Gearin (Daughter)  [phone]   Whitman Hero [place], [place] [phone] 07/28/2015, 9:32 PM

## 2015-07-29 ENCOUNTER — Inpatient Hospital Stay (HOSPITAL_COMMUNITY): Payer: Medicare Other

## 2015-07-29 LAB — CBC WITH DIFFERENTIAL/PLATELET
Basophils Absolute: 0 10*3/uL (ref 0.0–0.1)
Basophils Relative: 0 % (ref 0–1)
EOS ABS: 0.1 10*3/uL (ref 0.0–0.7)
Eosinophils Relative: 1 % (ref 0–5)
HEMATOCRIT: 29.1 % — AB (ref 39.0–52.0)
HEMOGLOBIN: 8.8 g/dL — AB (ref 13.0–17.0)
LYMPHS ABS: 1.1 10*3/uL (ref 0.7–4.0)
LYMPHS PCT: 12 % (ref 12–46)
MCH: 27.3 pg (ref 26.0–34.0)
MCHC: 30.2 g/dL (ref 30.0–36.0)
MCV: 90.4 fL (ref 78.0–100.0)
Monocytes Absolute: 1.1 10*3/uL — ABNORMAL HIGH (ref 0.1–1.0)
Monocytes Relative: 12 % (ref 3–12)
NEUTROS ABS: 6.7 10*3/uL (ref 1.7–7.7)
NEUTROS PCT: 75 % (ref 43–77)
Platelets: 286 10*3/uL (ref 150–400)
RBC: 3.22 MIL/uL — AB (ref 4.22–5.81)
RDW: 16.8 % — ABNORMAL HIGH (ref 11.5–15.5)
WBC: 9 10*3/uL (ref 4.0–10.5)

## 2015-07-29 LAB — MAGNESIUM: Magnesium: 2.6 mg/dL — ABNORMAL HIGH (ref 1.7–2.4)

## 2015-07-29 LAB — COMPREHENSIVE METABOLIC PANEL
ALK PHOS: 153 U/L — AB (ref 38–126)
AST: 8 U/L — ABNORMAL LOW (ref 15–41)
Albumin: 2 g/dL — ABNORMAL LOW (ref 3.5–5.0)
Anion gap: 8 (ref 5–15)
BILIRUBIN TOTAL: 0.4 mg/dL (ref 0.3–1.2)
BUN: 108 mg/dL — ABNORMAL HIGH (ref 6–20)
CALCIUM: 9.2 mg/dL (ref 8.9–10.3)
CO2: 26 mmol/L (ref 22–32)
CREATININE: 3.36 mg/dL — AB (ref 0.61–1.24)
Chloride: 108 mmol/L (ref 101–111)
GFR calc Af Amer: 18 mL/min — ABNORMAL LOW (ref >60)
GFR calc non Af Amer: 16 mL/min — ABNORMAL LOW (ref >60)
GLUCOSE: 186 mg/dL — AB (ref 65–99)
Potassium: 4.6 mmol/L (ref 3.5–5.1)
SODIUM: 142 mmol/L (ref 135–145)
Total Protein: 6.6 g/dL (ref 6.5–8.1)

## 2015-07-29 LAB — GLUCOSE, CAPILLARY: GLUCOSE-CAPILLARY: 215 mg/dL — AB (ref 65–99)

## 2015-07-29 MED ORDER — SODIUM CHLORIDE 0.9 % IV BOLUS (SEPSIS)
500.0000 mL | Freq: Once | INTRAVENOUS | Status: AC
  Administered 2015-07-29: 500 mL via INTRAVENOUS

## 2015-08-06 NOTE — Progress Notes (Signed)
Chaplain responded to page.  Family at bedside upon arrival.  MD informed family that pt has passed, chaplain supported pt's spouse of 46 years and two daughters who were with pt at bedside as pt passed.  Comfort cart now in room for family.  Pt's son also notified as per pt's daughter.  Family grieving appropriately at bedside and have notified their pastor Hosie Poisson) of pt's death.  Chaplain available for family as needed.    08/18/2015 1100  Clinical Encounter Type  Visited With Family;Health care provider  Visit Type Initial;Spiritual support;Death  Referral From Nurse  Spiritual Encounters  Spiritual Needs Emotional;Grief support  Stress Factors  Family Stress Factors Family relationships;Loss   Geralyn Flash 08/18/15 11:24 AM

## 2015-08-06 NOTE — Progress Notes (Addendum)
      [address]       [place] [address]             [phone]      2 Days Post-Op Procedure(s) (LRB): INSERTION PLEURAL DRAINAGE CATHETER (Left)   Subjective:  No complaints.  Resting comfortably, daughter at bedside  Objective: Vital signs in last 24 hours: Temp:  [96.9 F (36.1 C)-97.6 F (36.4 C)] 97.6 F (36.4 C) (08/24 0457) Pulse Rate:  [108] 108 (08/23 1200) Cardiac Rhythm:  [-] Atrial fibrillation (08/24 0630) Resp:  [18-22] 18 (08/24 0630) BP: (84-105)/(50-65) 90/52 mmHg (08/24 0630) SpO2:  [90 %-97 %] 92 % (08/24 0457) Weight:  [210 lb 1.6 oz (95.3 kg)] 210 lb 1.6 oz (95.3 kg) (08/24 0500)  Intake/Output from previous day: 08/23 0701 - 08/24 0700 In: 120 [P.O.:60; I.V.:60] Out: 875 [Urine:375; Chest Tube:500]  General appearance: alert, cooperative and no distress Heart: regular rate and rhythm Lungs: diminished breath sounds LLL Abdomen: soft, non-tender; bowel sounds normal; no masses,  no organomegaly Wound: clean and dry  Lab Results:  Recent Labs  07/28/15 0313 Aug 02, 2015 0303  WBC 8.4 9.0  HGB 8.6* 8.8*  HCT 27.9* 29.1*  PLT 239 286   BMET:  Recent Labs  07/28/15 0313 2015/08/02 0303  NA 141 142  K 4.6 4.6  CL 110 108  CO2 25 26  GLUCOSE 168* 186*  BUN 100* 108*  CREATININE 3.09* 3.36*  CALCIUM 9.0 9.2    PT/INR:  Recent Labs  07/28/2015 0446  LABPROT 17.6*  INR 1.44   ABG    Component Value Date/Time   PHART 7.344* 07/24/2015 2110   HCO3 29.5* 07/28/2015 2110   TCO2 27.8 07/14/2015 2110   O2SAT 94.0 07/30/2015 2110   CBG (last 3)   Recent Labs  07/28/15 1159 07/28/15 1548 07/28/15 2136  GLUCAP 149* 150* 142*    Assessment/Plan: S/P Procedure(s) (LRB): INSERTION PLEURAL DRAINAGE CATHETER (Left)  1. Left Pleur-x catheter in place- 500 cc output yesterday afternoon, continue daily drainage for now 2. Pulm- CXR with pneumonia,re expansion  Edema on left, small effusion on right 3. Care per  primary 4 repeat CXR tomorrow   LOS: 8 days    Ellwood Handler 08/02/2015  patient examined and medical record reviewed,agree with above note. Tharon Aquas Trigt III 02-Aug-2015

## 2015-08-06 NOTE — Progress Notes (Signed)
ED CM received voice message from Main CM office about [place] requesting clinical progress notes for this deceased pt  UR completed and forward to CMA andre

## 2015-08-06 NOTE — Discharge Summary (Addendum)
Death Summary  [name] [name] NVB:[id number] DOB: 09-Nov-1931 DOA: Jul 23, 2015  PCP: Nyoka Cowden, MD  Admit date: 07/23/15 Date of Death: 07-31-15  Final Diagnoses:  Acute hypoxic respiratory failure - recurrent large left pleural effusion (parapneumonic) Persistent L Lung infiltrate / PNA Hx of idiopathic recurrent R pleural effusion (likely due to R heart failure) Chronic Atrial fibrillation Acute on Chronic Systolic Right Heart Failure - Pulm HTN OSA  Mild COPD - former smoker Hypothyroidism Chronic kidney disease stage 4 Mild dysphagia Parkinson's disease DM 2 UTI Chronic foley cath  Hx B12 deficiency  MRSA Screen +  History of present illness:  81 year old with mild COPD, obstructive sleep apnea, pulmonary hypertension, A. Fib, and recurrent right pleural effusion of unclear etiology (had a Pleurx catheter on the right side in the past) admitted 2015/07/23 with a large left pleural effusion and respiratory failure. He was admitted at the end of July 2016 for left lower lobe pneumonia and a urinary tract infection.   Hospital Course:   Procedures: 8/17 - left thoracentesis - 1 L bloody exudate 8/21 - left thoracentesis 8/22 - Pleurx catheter left  The patient was admitted to the hospital with significant shortness of breath due to a large left-sided pleural effusion.  This effusion was tapped by radiology on 8/17 and revealed an exudate.  The clinical picture was most consistent with that of a large parapneumonic effusion.  The patient had been admitted to the hospital the end of the previous month with a left-sided pneumonia.  The patient's hospital course was somewhat protracted as attempts were made to relieve his large left-sided effusion, which rapidly reaccumulated following his thoracentesis.  TCTS was consulted.  Ultimately the decision was made to pursue definitive treatment of this recurrent effusion via placement of a Pleurx catheter.  The patient actually  tolerated the procedure quite well.  The day following the surgery the patient was alert and interactive, something the dictator had not seen in this patient for many days.  Unfortunately, 24 hours, the patient began to decline again.  He developed issues with bradycardia, whereas he had earlier been suffering with difficult to control tachycardia in the face of atrial fibrillation.  He became less responsive.  The family had decided that a focus on comfort will be most appropriate.  By 2015-07-31 it was clear the patient would not survive this hospitalization.  He rapidly declined.  At no point did he appear uncomfortable or to be struggling.  At 11:10 AM on 07-31-15 the patient peacefully passed away in his hospital room.  The patient was examined and pronounced by his attending physician.  SignedJoette Catching T  [facility] 07-31-2015, 6:40 PM

## 2015-08-06 NOTE — Progress Notes (Signed)
Post Mortem Care provided to patient.  Pt tx to morgue.

## 2015-08-06 NOTE — Progress Notes (Signed)
Family at bedside: wife and two daughters.   Dr. Thereasa Solo at bedside: ausculation of no heart sounds.  Pt pronounced and family informed.  Placed comfort cart in room. Chaplain at bedside to speak with family.  [facility] called for referral.  Pt decline-confirmation number [phone]   Will continue to monitor family for needs.

## 2015-08-06 NOTE — Progress Notes (Signed)
Monitor reading bp as 72/35, not accurate, manual BP was 84/52, HR 105 resp 22, continues in afib, O2 @ 6L [place], family @ bedside, no complaints or discomforts noted.

## 2015-08-06 NOTE — Progress Notes (Signed)
SLP Cancellation Note  Patient Details Name: [name] [name] MRN: [id number] DOB: 1931/03/17   Cancelled treatment:       Reason Eval/Treat Not Completed: Medical issues which prohibited therapy. Will sign off.  Lanier Ensign, Student SLP   Lanier Ensign 08/17/15, 10:21 AM

## 2015-08-06 NOTE — Progress Notes (Signed)
Pt found to have a SBP 50's with automatic cuff. Took BP with manual and read was 68/38. Placed pt in trend to help re-position and re-took BP result 102/77.  GCS currently at a 3 on assessment this morning.   Notified Dr. Thereasa Solo new orders provided. Will continue to monitor for further changes.

## 2015-08-06 DEATH — deceased

## 2015-08-13 ENCOUNTER — Ambulatory Visit: Payer: Medicare Other | Admitting: Neurology

## 2015-08-17 LAB — GRAM STAIN

## 2015-08-19 ENCOUNTER — Ambulatory Visit: Payer: Medicare Other | Admitting: Cardiothoracic Surgery

## 2015-10-22 ENCOUNTER — Ambulatory Visit: Payer: Medicare Other | Admitting: Internal Medicine

## 2016-08-20 IMAGING — CR DG CHEST 2V
2 series · 2 of 2 positions shown · non-contrast
Comparison: 12/24/2014

CLINICAL DATA: Recurrent pleural effusion, shortness of breath.

EXAM:
CHEST  2 VIEW

[w chest pa]
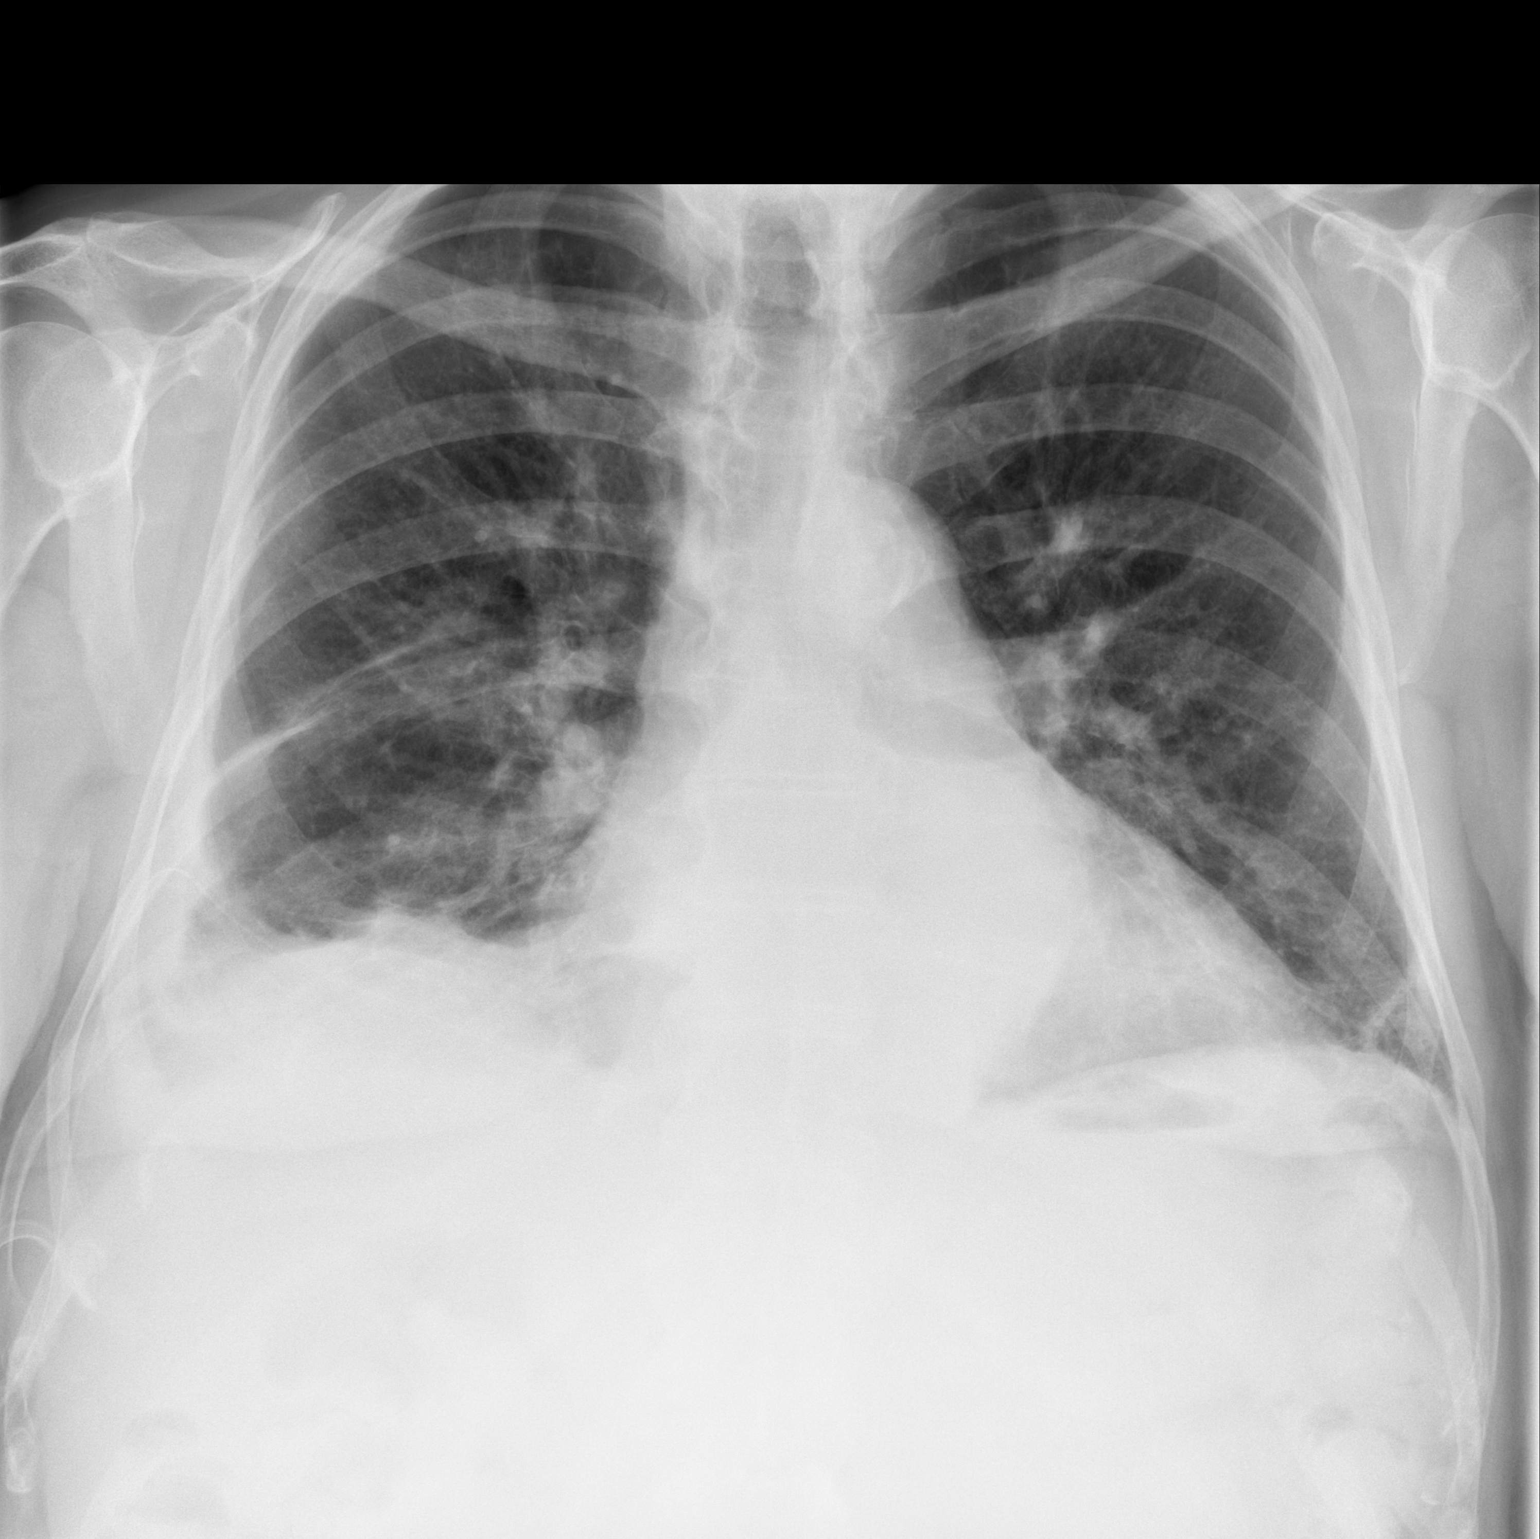

[w chest lat]
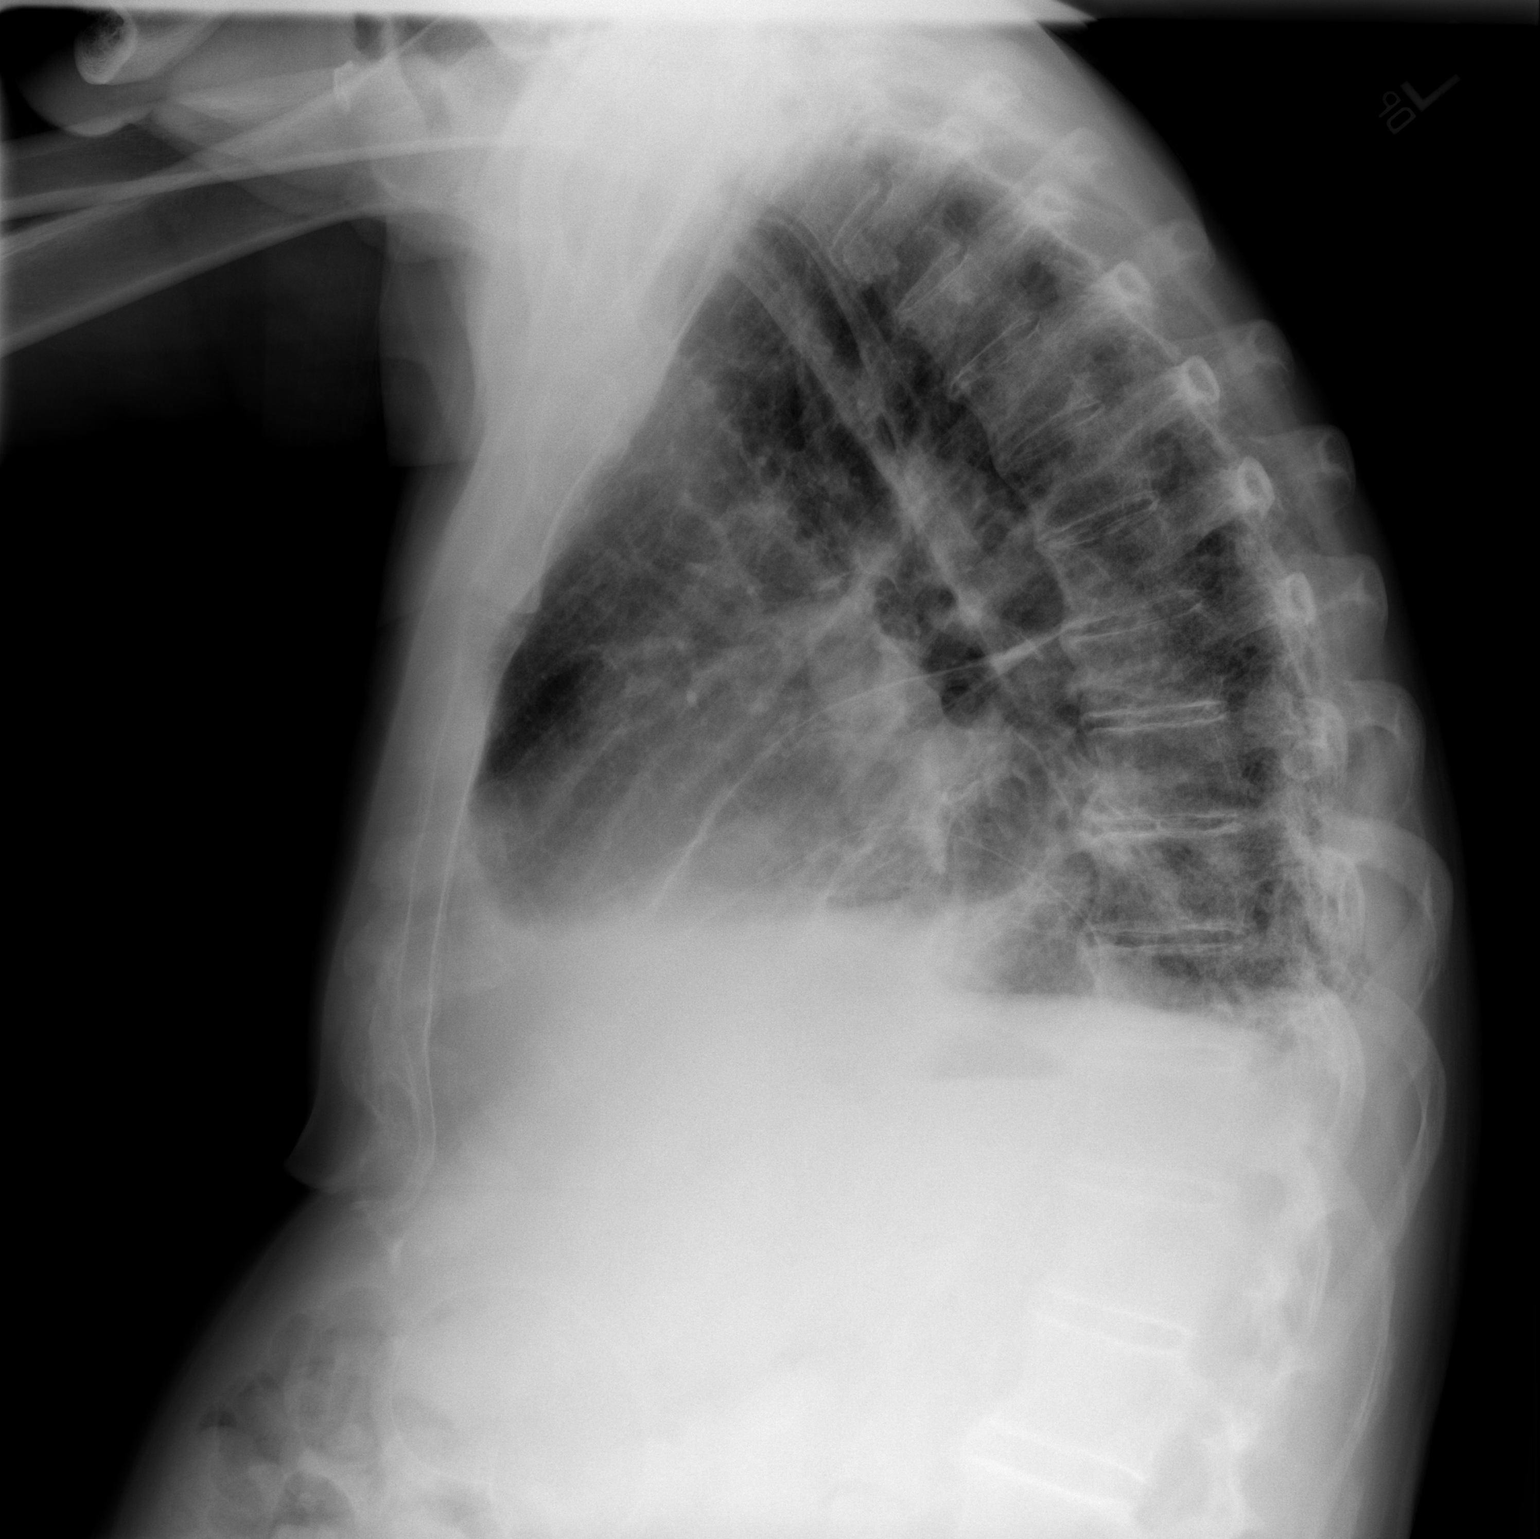

[2 of 2 positions shown; findings below may reference images not displayed]

FINDINGS: Cardiomegaly. Mediastinal contours are otherwise within normal
limits. Chronic bibasilar airspace opacities, scarring or
atelectasis. Right catheter remains in place, unchanged. Small right
pleural effusion. No pneumothorax.
IMPRESSION: Stable cardiomegaly and chronic bibasilar atelectasis or scarring.
Right pleural drain in stable position with small right effusion.
